# Patient Record
Sex: Male | Born: 1960
Health system: Southern US, Community
[De-identification: ages and names within clinical notes are randomized; demographics above are authoritative.]

## PROBLEM LIST (undated history)

## (undated) DIAGNOSIS — G709 Myoneural disorder, unspecified: Secondary | ICD-10-CM

## (undated) DIAGNOSIS — F1020 Alcohol dependence, uncomplicated: Secondary | ICD-10-CM

## (undated) DIAGNOSIS — K759 Inflammatory liver disease, unspecified: Secondary | ICD-10-CM

## (undated) DIAGNOSIS — K2100 Gastro-esophageal reflux disease with esophagitis, without bleeding: Secondary | ICD-10-CM

## (undated) DIAGNOSIS — F32A Depression, unspecified: Secondary | ICD-10-CM

## (undated) DIAGNOSIS — M1990 Unspecified osteoarthritis, unspecified site: Secondary | ICD-10-CM

## (undated) DIAGNOSIS — B9681 Helicobacter pylori [H. pylori] as the cause of diseases classified elsewhere: Secondary | ICD-10-CM

## (undated) DIAGNOSIS — K219 Gastro-esophageal reflux disease without esophagitis: Secondary | ICD-10-CM

## (undated) DIAGNOSIS — M109 Gout, unspecified: Secondary | ICD-10-CM

## (undated) DIAGNOSIS — I639 Cerebral infarction, unspecified: Secondary | ICD-10-CM

## (undated) DIAGNOSIS — D649 Anemia, unspecified: Secondary | ICD-10-CM

## (undated) DIAGNOSIS — G629 Polyneuropathy, unspecified: Secondary | ICD-10-CM

## (undated) DIAGNOSIS — D571 Sickle-cell disease without crisis: Secondary | ICD-10-CM

## (undated) DIAGNOSIS — I10 Essential (primary) hypertension: Secondary | ICD-10-CM

## (undated) DIAGNOSIS — F329 Major depressive disorder, single episode, unspecified: Secondary | ICD-10-CM

## (undated) DIAGNOSIS — L80 Vitiligo: Secondary | ICD-10-CM

## (undated) DIAGNOSIS — C801 Malignant (primary) neoplasm, unspecified: Secondary | ICD-10-CM

## (undated) DIAGNOSIS — K2970 Gastritis, unspecified, without bleeding: Secondary | ICD-10-CM

## (undated) DIAGNOSIS — J309 Allergic rhinitis, unspecified: Secondary | ICD-10-CM

## (undated) DIAGNOSIS — T7840XA Allergy, unspecified, initial encounter: Secondary | ICD-10-CM

## (undated) DIAGNOSIS — B182 Chronic viral hepatitis C: Secondary | ICD-10-CM

## (undated) DIAGNOSIS — M6282 Rhabdomyolysis: Secondary | ICD-10-CM

## (undated) HISTORY — PX: COLON SURGERY: SHX602

## (undated) HISTORY — DX: Cerebral infarction, unspecified: I63.9

---
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
## 2004-03-29 ENCOUNTER — Emergency Department: Payer: Self-pay | Admitting: Emergency Medicine

## 2005-09-05 ENCOUNTER — Emergency Department: Payer: Self-pay | Admitting: Emergency Medicine

## 2006-09-02 ENCOUNTER — Emergency Department: Payer: Self-pay | Admitting: Emergency Medicine

## 2006-09-03 ENCOUNTER — Emergency Department: Payer: Self-pay | Admitting: Unknown Physician Specialty

## 2006-09-10 ENCOUNTER — Emergency Department: Payer: Self-pay | Admitting: Emergency Medicine

## 2006-09-16 ENCOUNTER — Emergency Department: Payer: Self-pay | Admitting: Emergency Medicine

## 2007-01-11 ENCOUNTER — Emergency Department: Payer: Self-pay | Admitting: Emergency Medicine

## 2007-01-11 ENCOUNTER — Other Ambulatory Visit: Payer: Self-pay

## 2008-07-17 ENCOUNTER — Emergency Department: Payer: Self-pay | Admitting: Emergency Medicine

## 2008-07-21 ENCOUNTER — Emergency Department: Payer: Self-pay | Admitting: Emergency Medicine

## 2008-09-21 ENCOUNTER — Emergency Department: Payer: Self-pay | Admitting: Emergency Medicine

## 2009-12-11 ENCOUNTER — Emergency Department: Payer: Self-pay | Admitting: Emergency Medicine

## 2010-01-29 ENCOUNTER — Emergency Department: Payer: Self-pay | Admitting: Unknown Physician Specialty

## 2010-02-06 ENCOUNTER — Ambulatory Visit: Payer: Self-pay | Admitting: Internal Medicine

## 2010-03-28 ENCOUNTER — Ambulatory Visit: Payer: Self-pay | Admitting: Internal Medicine

## 2011-03-06 ENCOUNTER — Emergency Department: Payer: Self-pay

## 2011-09-28 ENCOUNTER — Emergency Department: Payer: Self-pay | Admitting: *Deleted

## 2011-09-28 LAB — COMPREHENSIVE METABOLIC PANEL
Albumin: 3.5 g/dL (ref 3.4–5.0)
Anion Gap: 11 (ref 7–16)
BUN: 16 mg/dL (ref 7–18)
Chloride: 106 mmol/L (ref 98–107)
Co2: 20 mmol/L — ABNORMAL LOW (ref 21–32)
Creatinine: 0.84 mg/dL (ref 0.60–1.30)
EGFR (Non-African Amer.): >60
Potassium: 4.2 mmol/L (ref 3.5–5.1)
Total Protein: 7.3 g/dL (ref 6.4–8.2)

## 2011-09-28 LAB — URINALYSIS, COMPLETE
Bilirubin,UR: NEGATIVE
Leukocyte Esterase: NEGATIVE
Protein: 30
Specific Gravity: 1.018 (ref 1.003–1.030)
Squamous Epithelial: NONE SEEN
WBC UR: 1 /HPF (ref 0–5)

## 2011-09-28 LAB — CBC
HCT: 41.3 % (ref 40.0–52.0)
HGB: 13.9 g/dL (ref 13.0–18.0)
MCH: 34.4 pg — ABNORMAL HIGH (ref 26.0–34.0)
MCHC: 33.7 g/dL (ref 32.0–36.0)
RBC: 4.04 10*6/uL — ABNORMAL LOW (ref 4.40–5.90)
WBC: 7.2 10*3/uL (ref 3.8–10.6)

## 2011-09-28 LAB — LIPASE, BLOOD: Lipase: 107 U/L (ref 73–393)

## 2011-10-08 ENCOUNTER — Ambulatory Visit: Payer: Self-pay | Admitting: Specialist

## 2012-02-26 ENCOUNTER — Ambulatory Visit: Payer: Self-pay | Admitting: Gastroenterology

## 2012-04-08 ENCOUNTER — Emergency Department: Payer: Self-pay | Admitting: Emergency Medicine

## 2012-04-08 LAB — URINALYSIS, COMPLETE
Bacteria: NONE SEEN
Glucose,UR: NEGATIVE mg/dL (ref 0–75)
Leukocyte Esterase: NEGATIVE
Nitrite: NEGATIVE
Protein: 30
RBC,UR: 4 /HPF (ref 0–5)
Specific Gravity: 1.023 (ref 1.003–1.030)
Squamous Epithelial: NONE SEEN
WBC UR: 1 /HPF (ref 0–5)

## 2012-04-08 LAB — COMPREHENSIVE METABOLIC PANEL
Anion Gap: 11 (ref 7–16)
Bilirubin,Total: 1.1 mg/dL — ABNORMAL HIGH (ref 0.2–1.0)
Calcium, Total: 8.5 mg/dL (ref 8.5–10.1)
Chloride: 106 mmol/L (ref 98–107)
Creatinine: 1 mg/dL (ref 0.60–1.30)
EGFR (African American): >60
EGFR (Non-African Amer.): >60
SGOT(AST): 139 U/L — ABNORMAL HIGH (ref 15–37)
SGPT (ALT): 104 U/L — ABNORMAL HIGH (ref 12–78)
Total Protein: 7.3 g/dL (ref 6.4–8.2)

## 2012-04-08 LAB — CBC
HCT: 39.7 % — ABNORMAL LOW (ref 40.0–52.0)
HGB: 13.4 g/dL (ref 13.0–18.0)
MCH: 34.4 pg — ABNORMAL HIGH (ref 26.0–34.0)
MCV: 102 fL — ABNORMAL HIGH (ref 80–100)
Platelet: 102 10*3/uL — ABNORMAL LOW (ref 150–440)
RDW: 14.9 % — ABNORMAL HIGH (ref 11.5–14.5)
WBC: 4.2 10*3/uL (ref 3.8–10.6)

## 2012-05-17 ENCOUNTER — Encounter (HOSPITAL_COMMUNITY): Payer: Self-pay | Admitting: *Deleted

## 2012-05-17 ENCOUNTER — Inpatient Hospital Stay (HOSPITAL_COMMUNITY)
Admission: AD | Admit: 2012-05-17 | Discharge: 2012-05-20 | DRG: 064 | Disposition: A | Discharge status: Rehabilitation Facility | Payer: Medicare Other | Source: Other Acute Inpatient Hospital | Attending: Neurology | Admitting: Neurology

## 2012-05-17 ENCOUNTER — Emergency Department: Payer: Self-pay | Admitting: Emergency Medicine

## 2012-05-17 DIAGNOSIS — F3289 Other specified depressive episodes: Secondary | ICD-10-CM | POA: Diagnosis present

## 2012-05-17 DIAGNOSIS — F329 Major depressive disorder, single episode, unspecified: Secondary | ICD-10-CM | POA: Diagnosis present

## 2012-05-17 DIAGNOSIS — I639 Cerebral infarction, unspecified: Secondary | ICD-10-CM | POA: Diagnosis present

## 2012-05-17 DIAGNOSIS — Z8249 Family history of ischemic heart disease and other diseases of the circulatory system: Secondary | ICD-10-CM

## 2012-05-17 DIAGNOSIS — R51 Headache: Secondary | ICD-10-CM | POA: Diagnosis present

## 2012-05-17 DIAGNOSIS — F1210 Cannabis abuse, uncomplicated: Secondary | ICD-10-CM | POA: Diagnosis present

## 2012-05-17 DIAGNOSIS — R471 Dysarthria and anarthria: Secondary | ICD-10-CM | POA: Diagnosis present

## 2012-05-17 DIAGNOSIS — I619 Nontraumatic intracerebral hemorrhage, unspecified: Secondary | ICD-10-CM | POA: Diagnosis present

## 2012-05-17 DIAGNOSIS — I6340 Cerebral infarction due to embolism of unspecified cerebral artery: Principal | ICD-10-CM | POA: Diagnosis present

## 2012-05-17 DIAGNOSIS — Q211 Atrial septal defect: Secondary | ICD-10-CM

## 2012-05-17 DIAGNOSIS — Q2111 Secundum atrial septal defect: Secondary | ICD-10-CM

## 2012-05-17 DIAGNOSIS — K219 Gastro-esophageal reflux disease without esophagitis: Secondary | ICD-10-CM | POA: Diagnosis present

## 2012-05-17 DIAGNOSIS — Z823 Family history of stroke: Secondary | ICD-10-CM

## 2012-05-17 DIAGNOSIS — G936 Cerebral edema: Secondary | ICD-10-CM | POA: Diagnosis present

## 2012-05-17 DIAGNOSIS — R519 Headache, unspecified: Secondary | ICD-10-CM | POA: Diagnosis present

## 2012-05-17 DIAGNOSIS — Z87891 Personal history of nicotine dependence: Secondary | ICD-10-CM

## 2012-05-17 DIAGNOSIS — Z833 Family history of diabetes mellitus: Secondary | ICD-10-CM

## 2012-05-17 DIAGNOSIS — G8190 Hemiplegia, unspecified affecting unspecified side: Secondary | ICD-10-CM | POA: Diagnosis present

## 2012-05-17 DIAGNOSIS — Q2112 Patent foramen ovale: Secondary | ICD-10-CM

## 2012-05-17 DIAGNOSIS — I10 Essential (primary) hypertension: Secondary | ICD-10-CM | POA: Diagnosis present

## 2012-05-17 HISTORY — DX: Depression, unspecified: F32.A

## 2012-05-17 HISTORY — DX: Inflammatory liver disease, unspecified: K75.9

## 2012-05-17 HISTORY — DX: Gastro-esophageal reflux disease without esophagitis: K21.9

## 2012-05-17 HISTORY — DX: Major depressive disorder, single episode, unspecified: F32.9

## 2012-05-17 HISTORY — DX: Essential (primary) hypertension: I10

## 2012-05-17 LAB — COMPREHENSIVE METABOLIC PANEL
Albumin: 3.7 g/dL (ref 3.4–5.0)
Alkaline Phosphatase: 169 U/L — ABNORMAL HIGH (ref 50–136)
Anion Gap: 11 (ref 7–16)
BUN: 14 mg/dL (ref 7–18)
Bilirubin,Total: 0.9 mg/dL (ref 0.2–1.0)
Calcium, Total: 8 mg/dL — ABNORMAL LOW (ref 8.5–10.1)
Chloride: 105 mmol/L (ref 98–107)
Co2: 21 mmol/L (ref 21–32)
Creatinine: 0.99 mg/dL (ref 0.60–1.30)
EGFR (African American): >60
EGFR (Non-African Amer.): >60
Glucose: 134 mg/dL — ABNORMAL HIGH (ref 65–99)
Osmolality: 276 (ref 275–301)
Potassium: 3.8 mmol/L (ref 3.5–5.1)
SGOT(AST): 245 U/L — ABNORMAL HIGH (ref 15–37)
SGPT (ALT): 143 U/L — ABNORMAL HIGH (ref 12–78)
Sodium: 137 mmol/L (ref 136–145)
Total Protein: 7.6 g/dL (ref 6.4–8.2)

## 2012-05-17 LAB — CBC
HCT: 42 % (ref 40.0–52.0)
HGB: 14 g/dL (ref 13.0–18.0)
MCH: 34 pg (ref 26.0–34.0)
MCHC: 33.4 g/dL (ref 32.0–36.0)
MCV: 102 fL — ABNORMAL HIGH (ref 80–100)
Platelet: 118 10*3/uL — ABNORMAL LOW (ref 150–440)
RBC: 4.12 10*6/uL — ABNORMAL LOW (ref 4.40–5.90)
RDW: 13.4 % (ref 11.5–14.5)
WBC: 5.9 10*3/uL (ref 3.8–10.6)

## 2012-05-17 LAB — PROTIME-INR
INR: 1.1
Prothrombin Time: 14.3 secs (ref 11.5–14.7)

## 2012-05-17 LAB — MRSA PCR SCREENING: MRSA by PCR: NEGATIVE

## 2012-05-17 MED ORDER — SENNOSIDES-DOCUSATE SODIUM 8.6-50 MG PO TABS
1.0000 | ORAL_TABLET | Freq: Two times a day (BID) | ORAL | Status: DC
  Administered 2012-05-18 – 2012-05-19 (×3): 1 via ORAL
  Filled 2012-05-17 (×7): qty 1

## 2012-05-17 MED ORDER — TRAMADOL HCL 50 MG PO TABS
50.0000 mg | ORAL_TABLET | Freq: Four times a day (QID) | ORAL | Status: DC | PRN
  Administered 2012-05-18 – 2012-05-19 (×3): 100 mg via ORAL
  Filled 2012-05-17 (×5): qty 2

## 2012-05-17 MED ORDER — HYDROMORPHONE HCL PF 1 MG/ML IJ SOLN
0.5000 mg | INTRAMUSCULAR | Status: DC | PRN
  Administered 2012-05-17 – 2012-05-18 (×5): 0.5 mg via INTRAVENOUS
  Filled 2012-05-17 (×5): qty 1

## 2012-05-17 MED ORDER — DOXEPIN HCL 25 MG PO CAPS
25.0000 mg | ORAL_CAPSULE | Freq: Every day | ORAL | Status: DC
  Administered 2012-05-18 – 2012-05-19 (×2): 25 mg via ORAL
  Filled 2012-05-17 (×3): qty 1

## 2012-05-17 MED ORDER — ONDANSETRON HCL 4 MG/2ML IJ SOLN: 4.0000 mg | Freq: Four times a day (QID) | INTRAMUSCULAR | Status: DC | PRN

## 2012-05-17 MED ORDER — SODIUM CHLORIDE 0.9 % IV SOLN
INTRAVENOUS | Status: DC
  Administered 2012-05-17 – 2012-05-20 (×4): via INTRAVENOUS

## 2012-05-17 MED ORDER — DULOXETINE HCL 60 MG PO CPEP
60.0000 mg | ORAL_CAPSULE | Freq: Every day | ORAL | Status: DC
  Administered 2012-05-18 – 2012-05-19 (×2): 60 mg via ORAL
  Filled 2012-05-17 (×3): qty 1

## 2012-05-17 MED ORDER — ACETAMINOPHEN 650 MG RE SUPP: 650.0000 mg | RECTAL | Status: DC | PRN

## 2012-05-17 MED ORDER — PANTOPRAZOLE SODIUM 40 MG IV SOLR
40.0000 mg | Freq: Every day | INTRAVENOUS | Status: DC
  Administered 2012-05-17: 40 mg via INTRAVENOUS
  Filled 2012-05-17 (×2): qty 40

## 2012-05-17 MED ORDER — ACETAMINOPHEN 325 MG PO TABS: 650.0000 mg | ORAL_TABLET | ORAL | Status: DC | PRN

## 2012-05-17 MED ORDER — SUCRALFATE 1 G PO TABS
1.0000 g | ORAL_TABLET | Freq: Three times a day (TID) | ORAL | Status: DC
  Administered 2012-05-18 – 2012-05-20 (×8): 1 g via ORAL
  Filled 2012-05-17 (×14): qty 1

## 2012-05-17 MED ORDER — LABETALOL HCL 5 MG/ML IV SOLN
10.0000 mg | INTRAVENOUS | Status: DC | PRN
  Administered 2012-05-17 – 2012-05-18 (×3): 20 mg via INTRAVENOUS
  Filled 2012-05-17 (×3): qty 4

## 2012-05-17 MED ORDER — HYDROXYZINE HCL 25 MG PO TABS
25.0000 mg | ORAL_TABLET | Freq: Four times a day (QID) | ORAL | Status: DC | PRN
  Filled 2012-05-17: qty 1

## 2012-05-17 MED ORDER — GABAPENTIN 100 MG PO CAPS
100.0000 mg | ORAL_CAPSULE | Freq: Two times a day (BID) | ORAL | Status: DC
  Administered 2012-05-18 – 2012-05-19 (×4): 100 mg via ORAL
  Filled 2012-05-17 (×6): qty 1

## 2012-05-17 NOTE — H&P (Signed)
Admission H&P    Chief Complaint: Left sided weakness, headache  HPI: Edward Weber is an 52 y.o. male who reports going to bed normal last evening.  Awakened today and felt drained.  On attempting to go to the bathroom felt as if he was having difficulty walking.  Then noted that he was having difficulty using his left arm.  Patient also complains of a headache.  Describes the headache as frontal and throbbing.  Associated with nausea but no vomiting, photophobia or phonophobia.  Patient was brought to [facility] ED.  Initial BP was 163/99.  Patient given Labetolol and Hydralazine for for BP.  Head CT was performed showing a right basal ganglia hemorrhage.  Patient was transferred to [facility] for further management.   Has a history of HTN but was taken off of medication about a year ago.    LSN: [id number] on 05/16/2012 tPA Given: No: I[facility]  Past Medical History  Diagnosis Date  . Hypertension   . GERD (gastroesophageal reflux disease)   . Depression   . Hepatitis     Past surgical history: None  Family history:  Mother is deceased and died of a stroke.  Father died of colon cancer.  Has a sister with diabetes and heart disease.  Children ar alive and well.  Social History:  reports that he has quit smoking. He does not have any smokeless tobacco history on file. He reports that he drinks about 5.4 ounces of alcohol per week. He reports that he uses illicit drugs (Marijuana).  Allergies: No Known Allergies  Medications Prior to Admission  Medication Sig Dispense Refill  . hydrOXYzine (ATARAX/VISTARIL) 25 MG tablet Take 25 mg by mouth 3 (three) times daily as needed. For itching      . lansoprazole (PREVACID) 15 MG capsule Take 15 mg by mouth daily as needed. For acid reflux      . naproxen sodium (ANAPROX) 220 MG tablet Take 440 mg by mouth 2 (two) times daily as needed. For headache        ROS: History obtained from the patient  General ROS: negative for - chills, fatigue, fever, night sweats,  weight gain or weight loss Psychological ROS: negative for - behavioral disorder, hallucinations, memory difficulties, mood swings or suicidal ideation Ophthalmic ROS: negative for - blurry vision, double vision, eye pain or loss of vision ENT ROS: negative for - epistaxis, nasal discharge, oral lesions, sore throat, tinnitus or vertigo Allergy and Immunology ROS: negative for - hives or itchy/watery eyes Hematological and Lymphatic ROS: negative for - bleeding problems, bruising or swollen lymph nodes Endocrine ROS: negative for - galactorrhea, hair pattern changes, polydipsia/polyuria or temperature intolerance Respiratory ROS: negative for - cough, hemoptysis, shortness of breath or wheezing Cardiovascular ROS: negative for - chest pain, dyspnea on exertion, edema or irregular heartbeat Gastrointestinal ROS: abdominal pain Genito-Urinary ROS: negative for - dysuria, hematuria, incontinence or urinary frequency/urgency Musculoskeletal ROS: negative for - joint swelling or muscular weakness Neurological ROS: as noted in HPI Dermatological ROS: rash with itching  Physical Examination: Blood pressure 154/87, pulse 88, temperature 98 F (36.7 C), temperature source Oral, resp. rate 21, height 5\' 8"  (1.727 m), weight 79.3 kg (174 lb 13.2 oz), SpO2 96.00%.  General Examination: HEENT-  Normocephalic, no lesions, without obvious abnormality.  Normal external eye and conjunctiva.  Normal TM's bilaterally.  Normal auditory canals and external ears. Normal external nose, mucus membranes and septum.  Normal pharynx. Neck supple with no masses, nodes, nodules or enlargement.  Cardiovascular - S1, S2 normal Lungs - chest clear, no wheezing, rales, normal symmetric air entry Abdomen - positive bowel sounds Extremities - no edema  Neurologic Examination: Mental Status: Alert, oriented, thought content appropriate.  Speech fluent without evidence of aphasia.  Able to follow 3 step commands without  difficulty. Cranial Nerves: II: Discs flat bilaterally; Visual fields grossly normal, pupils equal, round, reactive to light and accommodation III,IV, VI: ptosis not present, extra-ocular motions intact bilaterally V,VII: left facial droop, facial light touch sensation normal bilaterally VIII: hearing normal bilaterally IX,X: gag reflex reduced XI: bilateral shoulder shrug XII: midline tongue extension Motor: Right : Upper extremity   5/5    Left:     Upper extremity   4-/5  Lower extremity   5/5     Lower extremity   3+/5 Tone and bulk:normal tone throughout; no atrophy noted Sensory: Pinprick and light touch intact throughout, bilaterally Deep Tendon Reflexes: 2+ and symmetric throughout Plantars: Right: downgoing   Left: upgoing Cerebellar: normal finger-to-nose and normal heel-to-shin testing on the right Gait: unable to test CV: pulses palpable throughout   Laboratory Studies:  Lab work from [employer] reviewed and significant for elevated LFT's.   Basic Metabolic Panel: No results found for this basename: NA:5,K:5,CL:5,CO2:5,GLUCOSE:5,BUN:5,CREATININE:5,CALCIUM:3,MG:5,PHOS:5 in the last 168 hours  Liver Function Tests: No results found for this basename: AST:5,ALT:5,ALKPHOS:5,BILITOT:5,PROT:5,ALBUMIN:5 in the last 168 hours No results found for this basename: LIPASE:5,AMYLASE:5 in the last 168 hours No results found for this basename: AMMONIA:3 in the last 168 hours  CBC: No results found for this basename: WBC:5,NEUTROABS:5,HGB:5,HCT:5,MCV:5,PLT:5 in the last 168 hours  Cardiac Enzymes: No results found for this basename: CKTOTAL:5,CKMB:5,CKMBINDEX:5,TROPONINI:5 in the last 168 hours  BNP: No components found with this basename: POCBNP:5  CBG: No results found for this basename: GLUCAP:5 in the last 168 hours  Microbiology: No results found for this or any previous visit.  Coagulation Studies: No results found for this basename: LABPROT:5,INR:5 in the last 72  hours  Urinalysis: No results found for this basename: COLORURINE:2,APPERANCEUR:2,LABSPEC:2,PHURINE:2,GLUCOSEU:2,HGBUR:2,BILIRUBINUR:2,KETONESUR:2,PROTEINUR:2,UROBILINOGEN:2,NITRITE:2,LEUKOCYTESUR:2 in the last 168 hours  Lipid Panel:  No results found for this basename: chol, trig, hdl, cholhdl, vldl, ldlcalc    HgbA1C:  No results found for this basename: HGBA1C    Urine Drug Screen:   No results found for this basename: labopia, cocainscrnur, labbenz, amphetmu, thcu, labbarb    Alcohol Level: No results found for this basename: ETH:2 in the last 168 hours  EKG: NSR at 75 bpm.  Moderate voltage criteria for LVH.  Imaging: CT head: right basal ganglia hemorrhage with mild adjacent vasogenic edema but no midline shift.  Assessment: 52 y.o. male presenting with left sided weakness and right basal ganglia hemorrhage.  Patient with a history of hypertension.  Location consistent with hypertension.  Further work up recommended.    Stroke Risk Factors - hypertension  Plan: 1. HgbA1c, fasting lipid panel 2. MRI, MRA  of the brain without contrast 3. PT consult, OT consult, Speech consult 4. Echocardiogram 5. Carotid dopplers 6. Prophylactic therapy-None 7. Risk factor modification 8. Telemetry monitoring 9. Frequent neuro checks 10. Continue home emdications   Thana Farr, MD [facility] [phone] 05/17/2012, 8:05 PM

## 2012-05-18 ENCOUNTER — Inpatient Hospital Stay (HOSPITAL_COMMUNITY): Payer: Medicare Other

## 2012-05-18 DIAGNOSIS — G8190 Hemiplegia, unspecified affecting unspecified side: Secondary | ICD-10-CM

## 2012-05-18 DIAGNOSIS — I619 Nontraumatic intracerebral hemorrhage, unspecified: Secondary | ICD-10-CM

## 2012-05-18 LAB — LIPID PANEL
Cholesterol: 178 mg/dL (ref 0–200)
Triglycerides: 354 mg/dL — ABNORMAL HIGH (ref <150)

## 2012-05-18 LAB — HEMOGLOBIN A1C: Mean Plasma Glucose: 117 mg/dL — ABNORMAL HIGH (ref <117)

## 2012-05-18 MED ORDER — DIPHENHYDRAMINE HCL 50 MG/ML IJ SOLN
12.5000 mg | Freq: Once | INTRAMUSCULAR | Status: AC
  Administered 2012-05-18: 12.5 mg via INTRAVENOUS
  Filled 2012-05-18: qty 1

## 2012-05-18 MED ORDER — HYDROCHLOROTHIAZIDE 25 MG PO TABS
25.0000 mg | ORAL_TABLET | Freq: Every day | ORAL | Status: DC
  Administered 2012-05-18 – 2012-05-20 (×3): 25 mg via ORAL
  Filled 2012-05-18 (×4): qty 1

## 2012-05-18 MED ORDER — DIPHENHYDRAMINE-ZINC ACETATE 2-0.1 % EX CREA
TOPICAL_CREAM | Freq: Three times a day (TID) | CUTANEOUS | Status: DC | PRN
  Administered 2012-05-18: 1 via TOPICAL
  Filled 2012-05-18: qty 28

## 2012-05-18 MED ORDER — PANTOPRAZOLE SODIUM 40 MG PO TBEC
40.0000 mg | DELAYED_RELEASE_TABLET | Freq: Every day | ORAL | Status: DC
  Administered 2012-05-18 – 2012-05-19 (×2): 40 mg via ORAL
  Filled 2012-05-18 (×2): qty 1

## 2012-05-18 NOTE — Evaluation (Signed)
Clinical/Bedside Swallow Evaluation Patient Details  Name: MYSHAWN CHIRIBOGA MRN: [id number] Date of Birth: [birth year]  Today's Date: 05/18/2012 Time: 4098-1191 SLP Time Calculation (min): 17 min  Past Medical History:  Past Medical History  Diagnosis Date  . Hypertension   . GERD (gastroesophageal reflux disease)   . Depression   . Hepatitis    Past Surgical History: History reviewed. No pertinent past surgical history. HPI:  STCLAIR SZYMBORSKI is an 52 y.o. male who reports going to bed normal last evening 05/16/2012. Awakened today 05/17/2012 and felt drained. On attempting to go to the bathroom felt as if he was having difficulty walking. Then noted that he was having difficulty using his left arm. Patient also complains of a headache. Describes the headache as frontal and throbbing. Associated with nausea but no vomiting, photophobia or phonophobia. Patient was brought to [facility] ED. Initial BP was 163/99. Patient given Labetolol and Hydralazine for for BP. Head CT was performed showing a right basal ganglia hemorrhage. Patient was transferred to [facility] for further management.    Assessment / Plan / Recommendation Clinical Impression  Patient presents with a safe and functional oropharyngeal swallow without overt indication of aspiration. Recommend a regular diet, thin liquid with general safe swallowing precautions given acute nature of CVA. No SLP f/u for swallow indicated at this time. SLP signing off.     Aspiration Risk  Mild    Diet Recommendation Regular;Thin liquid   Liquid Administration via: Cup;Straw Medication Administration: Whole meds with liquid Supervision: Patient able to self feed Compensations: Slow rate;Small sips/bites Postural Changes and/or Swallow Maneuvers: Seated upright 90 degrees    Other  Recommendations Oral Care Recommendations: Oral care BID   Follow Up Recommendations  None    Frequency and Duration        Pertinent Vitals/Pain None reported         Swallow Study    General HPI: HUNTLEY KNOOP is an 53 y.o. male who reports going to bed normal last evening 05/16/2012. Awakened today 05/17/2012 and felt drained. On attempting to go to the bathroom felt as if he was having difficulty walking. Then noted that he was having difficulty using his left arm. Patient also complains of a headache. Describes the headache as frontal and throbbing. Associated with nausea but no vomiting, photophobia or phonophobia. Patient was brought to [facility] ED. Initial BP was 163/99. Patient given Labetolol and Hydralazine for for BP. Head CT was performed showing a right basal ganglia hemorrhage. Patient was transferred to [facility] for further management.  Type of Study: Bedside swallow evaluation Previous Swallow Assessment: none Diet Prior to this Study: NPO Temperature Spikes Noted: No Respiratory Status: Room air History of Recent Intubation: No Behavior/Cognition: Alert;Cooperative;Pleasant mood Oral Cavity - Dentition: Adequate natural dentition Self-Feeding Abilities: Able to feed self Patient Positioning: Upright in bed Baseline Vocal Quality: Clear Volitional Cough: Strong Volitional Swallow: Able to elicit    Oral/Motor/Sensory Function Overall Oral Motor/Sensory Function: Impaired Labial ROM: Reduced left (slight) Labial Symmetry: Abnormal symmetry left (slight) Labial Strength: Within Functional Limits Labial Sensation: Within Functional Limits Lingual ROM: Within Functional Limits Lingual Symmetry: Within Functional Limits Lingual Strength: Within Functional Limits Lingual Sensation: Within Functional Limits Facial ROM: Within Functional Limits Facial Symmetry: Within Functional Limits Facial Strength: Within Functional Limits Facial Sensation: Within Functional Limits Velum: Within Functional Limits Mandible: Within Functional Limits   Ice Chips Ice chips: Not tested   Thin Liquid Thin Liquid: Within functional limits Presentation:  Cup;Self Fed;Straw  Nectar Thick Nectar Thick Liquid: Not tested   Honey Thick Honey Thick Liquid: Not tested   Puree Puree: Within functional limits Presentation: Self Fed;Spoon   Solid   GO   Emmanuel Ercole MA, CCC-SLP [phone]  Solid: Within functional limits Presentation: Self Fed       Geremy Rister Meryl 05/18/2012,1:06 PM

## 2012-05-18 NOTE — Evaluation (Signed)
Physical Therapy Evaluation Patient Details Name: Edward Weber MRN: [id number] DOB: June 15, 1960 Today's Date: 05/18/2012 Time: 6295-2841 PT Time Calculation (min): 24 min  PT Assessment / Plan / Recommendation Clinical Impression  52 y.o. male presenting with difficulty walking, trouble using his left arm, and headache. Imaging confirms a right basal ganglia hemorrhage.  Pt will benefit from acute PT services to improve overall mobility and prepare for safe d/c to next venue.    PT Assessment  Patient needs continued PT services    Follow Up Recommendations  CIR    Barriers to Discharge None      Equipment Recommendations  Rolling walker with 5" wheels    Recommendations for Other Services Rehab consult   Frequency Min 4X/week    Precautions / Restrictions Precautions Precautions: Fall Restrictions Weight Bearing Restrictions: No   Pertinent Vitals/Pain No c/o pain      Mobility  Bed Mobility Bed Mobility: Supine to Sit;Sitting - Scoot to Edge of Bed Supine to Sit: 4: Min assist Sitting - Scoot to Edge of Bed: 5: Supervision Details for Bed Mobility Assistance: (A) to elevate trunk OOB with cues for safety. Transfers Transfers: Sit to Stand;Stand to [employer] Transfers Sit to Stand: 4: Min assist Stand to Sit: 4: Min assist Stand Pivot Transfers: 1: +2 Total assist Stand Pivot Transfers: Patient Percentage: 60% Details for Transfer Assistance: (A) to initiate transfer with cues for hand placement.  +2 (A) to maintain balance with cues for step sequence and hand placement.  Limited mobility due to dizziness. Ambulation/Gait Ambulation/Gait Assistance: Not tested (comment) Modified Rankin (Stroke Patients Only) Pre-Morbid Rankin Score: No symptoms Modified Rankin: Severe disability     PT Diagnosis: Difficulty walking;Generalized weakness  PT Problem List: Decreased strength;Decreased activity tolerance;Decreased balance;Decreased mobility;Decreased  coordination;Decreased knowledge of use of DME;Decreased knowledge of precautions PT Treatment Interventions: DME instruction;Gait training;Stair training;Functional mobility training;Therapeutic exercise;Therapeutic activities;Balance training;Neuromuscular re-education;Patient/family education   PT Goals Acute Rehab PT Goals PT Goal Formulation: With patient Time For Goal Achievement: 06/01/12 Potential to Achieve Goals: Good Pt will go Supine/Side to Sit: with modified independence PT Goal: Supine/Side to Sit - Progress: Goal set today Pt will go Sit to Supine/Side: with modified independence PT Goal: Sit to Supine/Side - Progress: Goal set today Pt will go Sit to Stand: with modified independence PT Goal: Sit to Stand - Progress: Goal set today Pt will go Stand to Sit: with modified independence PT Goal: Stand to Sit - Progress: Goal set today Pt will Transfer Bed to Chair/Chair to Bed: with supervision PT Transfer Goal: Bed to Chair/Chair to Bed - Progress: Goal set today Pt will Stand: with supervision;1 - 2 min PT Goal: Stand - Progress: Goal set today Pt will Ambulate: 16 - 50 feet;with min assist;with least restrictive assistive device PT Goal: Ambulate - Progress: Goal set today  Visit Information  Last PT Received On: 05/18/12 Assistance Needed: +2    Subjective Data  Subjective: "I feel weaker on the left side." Patient Stated Goal: To eventually go home.   Prior Functioning  Home Living Lives With: Alone Type of Home: Apartment Home Access: Stairs to enter Entrance Stairs-Number of Steps: 3 Entrance Stairs-Rails: Left Home Layout: One level Bathroom Shower/Tub: Forensic scientist: Standard Home Adaptive Equipment: None Prior Function Level of Independence: Independent (sister gets groceries) Able to Take Stairs?: Yes Driving: Yes Vocation: On disability Communication Communication: No difficulties Dominant Hand: Right    Cognition   Cognition Overall Cognitive Status: Appears within  functional limits for tasks assessed/performed Arousal/Alertness: Awake/alert Orientation Level: Appears intact for tasks assessed Behavior During Session: [facility] for tasks performed    Extremity/Trunk Assessment Right Lower Extremity Assessment RLE ROM/Strength/Tone: Deficits RLE ROM/Strength/Tone Deficits: 4+/5 Grossly RLE Sensation: WFL - Light Touch RLE Coordination: WFL - gross/fine motor Left Lower Extremity Assessment LLE ROM/Strength/Tone: Deficits LLE ROM/Strength/Tone Deficits: 3+/5 grossly LLE Sensation: WFL - Light Touch LLE Coordination: Deficits (Impaired heel shin)   Balance Balance Balance Assessed: Yes Static Sitting Balance Static Sitting - Balance Support: Feet supported Static Sitting - Level of Assistance: 5: Stand by assistance  End of Session PT - End of Session Equipment Utilized During Treatment: Gait belt Activity Tolerance: Patient tolerated treatment well Patient left: in chair;with call bell/phone within reach Nurse Communication: Mobility status  GP     Mikaella Escalona 05/18/2012, 3:10 PM Jake Shark, PT DPT [phone]

## 2012-05-18 NOTE — Progress Notes (Signed)
UR COMPLETED  

## 2012-05-18 NOTE — Evaluation (Signed)
Speech Language Pathology Evaluation Patient Details Name: Edward Weber MRN: [id number] DOB: 1960/04/26 Today's Date: 05/18/2012 Time: 7829-5621 SLP Time Calculation (min): 9 min  Problem List:  Patient Active Problem List  Diagnosis  . Hemiplegia, unspecified, affecting nondominant side  . Intracerebral hemorrhage   Past Medical History:  Past Medical History  Diagnosis Date  . Hypertension   . GERD (gastroesophageal reflux disease)   . Depression   . Hepatitis    Past Surgical History: History reviewed. No pertinent past surgical history. HPI:  Edward Weber is an 52 y.o. male who reports going to bed normal last evening 05/16/2012. Awakened today 05/17/2012 and felt drained. On attempting to go to the bathroom felt as if he was having difficulty walking. Then noted that he was having difficulty using his left arm. Patient also complains of a headache. Describes the headache as frontal and throbbing. Associated with nausea but no vomiting, photophobia or phonophobia. Patient was brought to [facility] ED. Initial BP was 163/99. Patient given Labetolol and Hydralazine for for BP. Head CT was performed showing a right basal ganglia hemorrhage. Patient was transferred to [facility] for further management.    Assessment / Plan / Recommendation Clinical Impression  Cognitive linguistic evaluation complete. Cognitive-linguistic skills appear WFL at this time. No further SLP needs indicated at this time. Please reconsult as needed.     SLP Assessment  Patient does not need any further Speech Lanaguage Pathology Services    Follow Up Recommendations  None       Pertinent Vitals/Pain None reported    SLP Evaluation Prior Functioning  Cognitive/Linguistic Baseline: Within functional limits Type of Home: House Lives With: Alone   Cognition  Overall Cognitive Status: Appears within functional limits for tasks assessed Orientation Level: Oriented X4    Comprehension  Auditory  Comprehension Overall Auditory Comprehension: Appears within functional limits for tasks assessed Visual Recognition/Discrimination Discrimination: Within Function Limits Reading Comprehension Reading Status: Not tested    Expression Expression Primary Mode of Expression: Verbal Verbal Expression Overall Verbal Expression: Appears within functional limits for tasks assessed   Oral / Motor Oral Motor/Sensory Function Overall Oral Motor/Sensory Function: Impaired Labial ROM: Reduced left (slight) Labial Symmetry: Abnormal symmetry left (slight) Labial Strength: Within Functional Limits Labial Sensation: Within Functional Limits Lingual ROM: Within Functional Limits Lingual Symmetry: Within Functional Limits Lingual Strength: Within Functional Limits Lingual Sensation: Within Functional Limits Facial ROM: Within Functional Limits Facial Symmetry: Within Functional Limits Facial Strength: Within Functional Limits Facial Sensation: Within Functional Limits Velum: Within Functional Limits Mandible: Within Functional Limits Motor Speech Overall Motor Speech: Appears within functional limits for tasks assessed (low vocal intensity however functional)   GO   Ferdinand Lango MA, CCC-SLP [phone]   Edward Weber Meryl 05/18/2012, 1:11 PM

## 2012-05-18 NOTE — Progress Notes (Signed)
Stroke Team Progress Note  HISTORY Edward Weber is an 52 y.o. male who reports going to bed normal last evening 05/16/2012. Awakened today 05/17/2012 and felt drained. On attempting to go to the bathroom felt as if he was having difficulty walking. Then noted that he was having difficulty using his left arm. Patient also complains of a headache. Describes the headache as frontal and throbbing. Associated with nausea but no vomiting, photophobia or phonophobia. Patient was brought to [facility] ED. Initial BP was 163/99. Patient given Labetolol and Hydralazine for for BP. Head CT was performed showing a right basal ganglia hemorrhage. Patient was transferred to [facility] for further management. Has a history of HTN but was taken off of medication about a year ago. Patient was not a TPA candidate secondary to hemorrhage. He was admitted to the neuro ICU for further evaluation and treatment.  SUBJECTIVE No family is at the bedside.  Overall he feels his condition is stable.   OBJECTIVE Most recent Vital Signs: Filed Vitals:   05/18/12 0700 05/18/12 0730 05/18/12 0750 05/18/12 0800  BP: 156/134 158/88  165/81  Pulse: 66 60  61  Temp:   98.2 F (36.8 C)   TempSrc:   Oral   Resp: 17 17  16   Height:      Weight:      SpO2: 98% 95%  96%   CBG (last 3)  No results found for this basename: GLUCAP:3 in the last 72 hours  IV Fluid Intake:      . sodium chloride 75 mL/hr at 05/18/12 0808    MEDICATIONS     . doxepin  25 mg Oral QHS  . DULoxetine  60 mg Oral Daily  . gabapentin  100 mg Oral BID  . pantoprazole (PROTONIX) IV  40 mg Intravenous QHS  . senna-docusate  1 tablet Oral BID  . sucralfate  1 g Oral TID WC & HS   PRN:  acetaminophen, acetaminophen, diphenhydrAMINE-zinc acetate, HYDROmorphone (DILAUDID) injection, hydrOXYzine, labetalol, ondansetron (ZOFRAN) IV, traMADol  Diet:  NPO  Activity:  Bedrest DVT Prophylaxis:  SCDs   CLINICALLY SIGNIFICANT STUDIES Basic Metabolic Panel: No  results found for this basename: NA:2,K:2,CL:2,CO2:2,GLUCOSE:2,BUN:2,CREATININE:2,CALCIUM:2,MG:2,PHOS:2 in the last 168 hours Liver Function Tests: No results found for this basename: AST:2,ALT:2,ALKPHOS:2,BILITOT:2,PROT:2,ALBUMIN:2 in the last 168 hours CBC: No results found for this basename: WBC:2,NEUTROABS:2,HGB:2,HCT:2,MCV:2,PLT:2 in the last 168 hours Coagulation: No results found for this basename: LABPROT:4,INR:4 in the last 168 hours Cardiac Enzymes: No results found for this basename: CKTOTAL:3,CKMB:3,CKMBINDEX:3,TROPONINI:3 in the last 168 hours Urinalysis: No results found for this basename: COLORURINE:2,APPERANCEUR:2,LABSPEC:2,PHURINE:2,GLUCOSEU:2,HGBUR:2,BILIRUBINUR:2,KETONESUR:2,PROTEINUR:2,UROBILINOGEN:2,NITRITE:2,LEUKOCYTESUR:2 in the last 168 hours Lipid Panel No results found for this basename: chol,  trig,  hdl,  cholhdl,  vldl,  ldlcalc   HgbA1C  No results found for this basename: HGBA1C   Urine Drug Screen:   No results found for this basename: labopia,  cocainscrnur,  labbenz,  amphetmu,  thcu,  labbarb    Alcohol Level: No results found for this basename: ETH:2 in the last 168 hours  No results found.  CT of the brain    MRI of the brain    MRA of the brain    2D Echocardiogram    Carotid Doppler    CXR    EKG     Therapy Recommendations   Physical Exam   Pleasant middle aged african [place] male not in distress.Awake alert. Afebrile. Head is nontraumatic. Neck is supple without bruit. Hearing is normal. Cardiac exam no murmur or gallop.  Lungs are clear to auscultation. Distal pulses are well felt.  Neurological Exam : Awake alert oriented x 3 normal speech and language.extraocular movements are full range without nystagmus. Fundi were not visualized. Pupils are equal reactive. Vision acuity and fields appear normal. Mild left lower face asymmetry. Tongue midline. No drift. Mild diminished fine finger movements on left.mild left upper and lower extremity  drift with 4/5 strength. Orbits right over left upper extremity. Mild left grip weak.. Normal sensation . Normal coordination.  ASSESSMENT [name] is a 51 y.o. male presenting with difficulty walking, trouble using his left arm, and headache. Imaging confirms a right basal ganglia hemorrhage. Hemorrhage felt to be possibly secondary to ischemic infarct with hemorrhagic transformation as BP not greatly elevated.  On no antiplatelets prior to admission. Patient with resultant left hemiparesis and dysarthria. Work up underway.  Hypertension, highest BP 177/94, 163/99 on arrival. Taken off medications 1 month ago GERD Depression Hepatitis etoh use Marijuana use  Hospital day # 1  TREATMENT/PLAN  OOB. Therapy evals  Keep in ICU to monitor hemorrhage  Carotid doppler  2D echo  Lipid panel and hgbA1c  MRI, MRA  CT head in am  Annie Main, MSN, RN, ANVP-BC, ANP-BC, GNP-BC [facility] Pager: [phone] 05/18/2012 9:05 AM  I have personally obtained a history, examined the patient, evaluated imaging results, and formulated the assessment and plan of care. I agree with the above. This patient is critically ill and at significant risk of neurological worsening, death and care requires constant monitoring of vital signs, hemodynamics,respiratory and cardiac monitoring,review of multiple databases, neurological assessment, discussion with family, other specialists and medical decision making of high complexity. I spent 30 minutes of neurocritical care time  in the care of  this patient.   Delia Heady, MD Medical Director [facility] Pager: [phone] 05/18/2012 6:00 PM

## 2012-05-18 NOTE — Progress Notes (Signed)
Rehab Admissions Coordinator Note:  Patient was screened by Lutricia Horsfall, RN for appropriateness for an Inpatient Acute Rehab Consult.  At this time, we are recommending Inpatient Rehab consult.  Meryl Dare 05/18/2012, 3:27 PM  I can be reached at [phone].

## 2012-05-18 NOTE — Evaluation (Signed)
Occupational Therapy Evaluation Patient Details Name: Edward Weber MRN: [id number] DOB: November 23, 1960 Today's Date: 05/18/2012 Time: 4098-1191 OT Time Calculation (min): 16 min  OT Assessment / Plan / Recommendation Clinical Impression  52 y.o. male presenting with difficulty walking, trouble using his left arm, and headache. Imaging confirms a right basal ganglia hemorrhage. Pt will benefit from skilled OT in the acute setting followed by CIR to maximize I with ADL and ADL mobility prior to d/c.     OT Assessment  Patient needs continued OT Services    Follow Up Recommendations  CIR    Barriers to Discharge      Equipment Recommendations   (TBD)    Recommendations for Other Services Rehab consult  Frequency  Min 3X/week    Precautions / Restrictions Precautions Precautions: Fall Restrictions Weight Bearing Restrictions: No   Pertinent Vitals/Pain Pt reports drowsiness but no pain    ADL  Eating/Feeding: Minimal assistance Where Assessed - Eating/Feeding: Chair Grooming: Minimal assistance Where Assessed - Grooming: Supported sitting Upper Body Bathing: Supervision/safety;Set up Where Assessed - Upper Body Bathing: Supported sitting Lower Body Bathing: Moderate assistance Where Assessed - Lower Body Bathing: Supported sit to stand Upper Body Dressing: Moderate assistance Where Assessed - Upper Body Dressing: Supported sitting Lower Body Dressing: Maximal assistance Where Assessed - Lower Body Dressing: Supported sit to Pharmacist, hospital: Minimal Dentist Method: Sit to Barista: Bedside commode Toileting - Clothing Manipulation and Hygiene: Moderate assistance Where Assessed - Toileting Clothing Manipulation and Hygiene: Sit to stand from 3-in-1 or toilet Transfers/Ambulation Related to ADLs: +2total (pt=60%) stand pivot bed to chair. unable to test ambulation secondary to dizziness    OT Diagnosis: Generalized  weakness;Paresis;Apraxia  OT Problem List: Decreased strength;Decreased range of motion;Decreased activity tolerance;Impaired balance (sitting and/or standing);Decreased coordination;Decreased knowledge of use of DME or AE;Decreased knowledge of precautions;Impaired UE functional use;Impaired sensation OT Treatment Interventions: Self-care/ADL training;Neuromuscular education;DME and/or AE instruction;Therapeutic activities;Patient/family education;Balance training   OT Goals Acute Rehab OT Goals OT Goal Formulation: With patient Time For Goal Achievement: 05/25/12 Potential to Achieve Goals: Good ADL Goals Pt Will Perform Eating: Independently;Sitting, edge of bed;Sitting, chair ADL Goal: Eating - Progress: Goal set today Pt Will Perform Grooming: Independently;Sitting at sink;Standing at sink ADL Goal: Grooming - Progress: Goal set today Pt Will Perform Upper Body Dressing: Independently;Sitting, chair;Sitting, bed ADL Goal: Upper Body Dressing - Progress: Goal set today Pt Will Transfer to Toilet: Independently;with modified independence;Ambulation ADL Goal: Toilet Transfer - Progress: Goal set today Pt Will Perform Toileting - Clothing Manipulation: Independently;Standing ADL Goal: Toileting - Clothing Manipulation - Progress: Goal set today Additional ADL Goal #1: Pt will be I with bil UE strengthening and coordination HEP in prep for increased I with ADLs ADL Goal: Additional Goal #1 - Progress: Goal set today  Visit Information  Last OT Received On: 05/18/12 Assistance Needed: +2    Subjective Data  Subjective: "I just feel so weak." Patient Stated Goal: Return home   Prior Functioning     Home Living Lives With: Alone Type of Home: Apartment Home Access: Stairs to enter Entrance Stairs-Number of Steps: 3 Entrance Stairs-Rails: Left Home Layout: One level Bathroom Shower/Tub: Forensic scientist: Standard Home Adaptive Equipment: None Prior  Function Level of Independence: Independent (sister gets groceries) Able to Take Stairs?: Yes Driving: Yes Vocation: On disability Communication Communication: No difficulties Dominant Hand: Right         Vision/Perception Vision - History Baseline Vision: Wears glasses  all the time Vision - Assessment Additional Comments: pt requested to defer testing due to increased drowsiness   Cognition  Cognition Overall Cognitive Status: Appears within functional limits for tasks assessed/performed Arousal/Alertness: Awake/alert Orientation Level: Appears intact for tasks assessed Behavior During Session: [facility] for tasks performed    Extremity/Trunk Assessment Right Upper Extremity Assessment RUE ROM/Strength/Tone: Deficits RUE ROM/Strength/Tone Deficits: 3/5 grip; 4/5 wrist; WFL elbow/shoulder RUE Sensation: WFL - Light Touch RUE Coordination:  (limited due to poor grip strength) Left Upper Extremity Assessment LUE ROM/Strength/Tone: Deficits LUE ROM/Strength/Tone Deficits: pt inconsistent with performance during testing and functional activity but feel this may be related to area of hemorrhage. Pt with 2+/5 shoulder and distally, including supination/pronation and grip. LUE Sensation: Deficits LUE Sensation Deficits: states he feels like he is holding something in his hand when hand is empty LUE Coordination: Deficits LUE Coordination Deficits: poor initiation as well as motor control/grading Right Lower Extremity Assessment RLE ROM/Strength/Tone: Deficits RLE ROM/Strength/Tone Deficits: 4+/5 Grossly RLE Sensation: WFL - Light Touch RLE Coordination: WFL - gross/fine motor Left Lower Extremity Assessment LLE ROM/Strength/Tone: Deficits LLE ROM/Strength/Tone Deficits: 3+/5 grossly LLE Sensation: WFL - Light Touch LLE Coordination: Deficits (Impaired heel shin)     Mobility Bed Mobility Bed Mobility: Supine to Sit;Sitting - Scoot to Edge of Bed Supine to Sit: 4: Min  assist Sitting - Scoot to Edge of Bed: 5: Supervision Details for Bed Mobility Assistance: (A) to elevate trunk OOB with cues for safety. Transfers Sit to Stand: 4: Min assist Stand to Sit: 4: Min assist Details for Transfer Assistance: (A) to initiate transfer with cues for hand placement.  +2 (A) to maintain balance with cues for step sequence and hand placement.  Limited mobility due to dizziness.     Exercise Other Exercises Other Exercises: AAROM: shoulder flexion/extension; elbow flexion/extension; wrist flexion/extension; supination/pronation; finger adduction/abduction. 5 reps each. encouraged to perform 10 reps x 3 times per day   Balance Balance Balance Assessed: Yes Static Sitting Balance Static Sitting - Balance Support: Feet supported Static Sitting - Level of Assistance: 5: Stand by assistance   End of Session OT - End of Session Activity Tolerance: Patient limited by fatigue Patient left: in chair;with call bell/phone within reach Nurse Communication: Mobility status  GO     Cleva Camero 05/18/2012, 3:34 PM

## 2012-05-19 ENCOUNTER — Inpatient Hospital Stay (HOSPITAL_COMMUNITY): Payer: Medicare Other

## 2012-05-19 DIAGNOSIS — I6789 Other cerebrovascular disease: Secondary | ICD-10-CM

## 2012-05-19 DIAGNOSIS — R51 Headache: Secondary | ICD-10-CM | POA: Diagnosis present

## 2012-05-19 DIAGNOSIS — I639 Cerebral infarction, unspecified: Secondary | ICD-10-CM | POA: Diagnosis present

## 2012-05-19 DIAGNOSIS — G936 Cerebral edema: Secondary | ICD-10-CM | POA: Diagnosis present

## 2012-05-19 DIAGNOSIS — I10 Essential (primary) hypertension: Secondary | ICD-10-CM | POA: Diagnosis present

## 2012-05-19 DIAGNOSIS — I619 Nontraumatic intracerebral hemorrhage, unspecified: Secondary | ICD-10-CM

## 2012-05-19 DIAGNOSIS — R519 Headache, unspecified: Secondary | ICD-10-CM | POA: Diagnosis present

## 2012-05-19 DIAGNOSIS — I6340 Cerebral infarction due to embolism of unspecified cerebral artery: Principal | ICD-10-CM

## 2012-05-19 NOTE — Progress Notes (Signed)
Stroke Team Progress Note  HISTORY Edward Weber is an 52 y.o. male who reports going to bed normal last evening 05/16/2012. Awakened today 05/17/2012 and felt drained. On attempting to go to the bathroom felt as if he was having difficulty walking. Then noted that he was having difficulty using his left arm. Patient also complains of a headache. Describes the headache as frontal and throbbing. Associated with nausea but no vomiting, photophobia or phonophobia. Patient was brought to [facility] ED. Initial BP was 163/99. Patient given Labetolol and Hydralazine for for BP. Head CT was performed showing a right basal ganglia hemorrhage. Patient was transferred to [facility] for further management. Has a history of HTN but was taken off of medication about a year ago. Patient was not a TPA candidate secondary to hemorrhage. He was admitted to the neuro ICU for further evaluation and treatment.  SUBJECTIVE No complaints.  OBJECTIVE Most recent Vital Signs: Filed Vitals:   05/19/12 0420 05/19/12 0500 05/19/12 0600 05/19/12 0807  BP:  157/84 165/94   Pulse:   59   Temp: 97.5 F (36.4 C)   97.9 F (36.6 C)  TempSrc: Oral   Oral  Resp:   20   Height:      Weight:      SpO2:   92%    CBG (last 3)  No results found for this basename: GLUCAP:3 in the last 72 hours  IV Fluid Intake:     . sodium chloride 75 mL/hr at 05/18/12 2300   MEDICATIONS    . doxepin  25 mg Oral QHS  . DULoxetine  60 mg Oral Daily  . gabapentin  100 mg Oral BID  . hydrochlorothiazide  25 mg Oral Q breakfast  . pantoprazole  40 mg Oral Q1200  . senna-docusate  1 tablet Oral BID  . sucralfate  1 g Oral TID WC & HS   PRN:  acetaminophen, acetaminophen, diphenhydrAMINE-zinc acetate, HYDROmorphone (DILAUDID) injection, hydrOXYzine, labetalol, ondansetron (ZOFRAN) IV, traMADol  Diet:  Cardiac thin liquids Activity:  As tolerated DVT Prophylaxis:  SCDs   CLINICALLY SIGNIFICANT STUDIES Basic Metabolic Panel: No results found for  this basename: NA:2,K:2,CL:2,CO2:2,GLUCOSE:2,BUN:2,CREATININE:2,CALCIUM:2,MG:2,PHOS:2 in the last 168 hours Liver Function Tests: No results found for this basename: AST:2,ALT:2,ALKPHOS:2,BILITOT:2,PROT:2,ALBUMIN:2 in the last 168 hours CBC: No results found for this basename: WBC:2,NEUTROABS:2,HGB:2,HCT:2,MCV:2,PLT:2 in the last 168 hours Coagulation: No results found for this basename: LABPROT:4,INR:4 in the last 168 hours Cardiac Enzymes: No results found for this basename: CKTOTAL:3,CKMB:3,CKMBINDEX:3,TROPONINI:3 in the last 168 hours Urinalysis: No results found for this basename: COLORURINE:2,APPERANCEUR:2,LABSPEC:2,PHURINE:2,GLUCOSEU:2,HGBUR:2,BILIRUBINUR:2,KETONESUR:2,PROTEINUR:2,UROBILINOGEN:2,NITRITE:2,LEUKOCYTESUR:2 in the last 168 hours  Lipid Panel     Component Value Date/Time   CHOL 178 05/18/2012 1008   TRIG 354* 05/18/2012 1008   HDL 20* 05/18/2012 1008   CHOLHDL 8.9 05/18/2012 1008   VLDL 71* 05/18/2012 1008   LDLCALC 87 05/18/2012 1008   HgbA1C  Lab Results  Component Value Date   HGBA1C 5.7* 05/18/2012   Urine Drug Screen:   No results found for this basename: labopia,  cocainscrnur,  labbenz,  amphetmu,  thcu,  labbarb    Alcohol Level: No results found for this basename: ETH:2 in the last 168 hours  CT of the brain   05/19/2012 Stable appearance of intraparenchymal hematoma in the right lentiform nucleus. 05/18/2012 Stable appearance of intraparenchymal hematoma in the right lentiform nucleus.     MRI of the brain  05/18/2012  1.  Acute right lentiform nuclei hemorrhage with estimated volume of 9 ml and  mild associated mass effect and edema.  No intraventricular or extra-axial hemorrhage. 2.  Acute to subacute appearing nodular infarct in the medial left periatrial white matter abutting the splenium of the corpus callosum.  No significant mass effect and no associated hemorrhage. 3.  Small chronic-appearing T2 hyperintense lacune/lesion in the left corona radiata with trace  associated hemosiderin. 4. The above could reflect chronic small vessel disease, but consider the possibility of a central embolic source, such as patent foramen ovale.   MRA of the brain  05/18/2012  Mild intracranial atherosclerosis.  Otherwise negative intracranial MRA.     2D Echocardiogram    Carotid Doppler    CXR    EKG     Therapy Recommendations CIR  Physical Exam   Pleasant middle aged african [place] male not in distress.Awake alert. Afebrile. Head is nontraumatic. Neck is supple without bruit. Hearing is normal. Cardiac exam no murmur or gallop. Lungs are clear to auscultation. Distal pulses are well felt.  Neurological Exam : Awake alert oriented x 3 normal speech and language.extraocular movements are full range without nystagmus. Fundi were not visualized. Pupils are equal reactive. Vision acuity and fields appear normal. Mild left lower face asymmetry. Tongue midline. No drift. Mild diminished fine finger movements on left.mild left upper and lower extremity drift with 4/5 strength. Orbits right over left upper extremity. Mild left grip weak.. Normal sensation . Normal coordination.   ASSESSMENT [name] is a 52 y.o. male presenting with difficulty walking, trouble using his left arm, and headache. Imaging confirms a left spleenium infarct and right basal ganglia infarct with sizeable hemorrhagic transformation. Infarcts felt to be embolic secondary to unknown etiology. On no antiplatelets prior to admission. Patient with resultant left hemiparesis and dysarthria. Work up underway.  Hypertension, highest BP 177/94, 163/99 on arrival. Taken off medications 1 month ago. HCTZ added yesterday. GERD Depression Hepatitis etoh use Marijuana use LDL 87 HgbA1c 5.7  Hospital day # 2  TREATMENT/PLAN  Change SBP goal to < 180  F/u Carotid doppler  F/u 2D echo TEE to look for embolic source. Arranged with [facility] for tomorrow.  If positive for PFO (patent  foramen ovale), check bilateral lower extremity venous dopplers to rule out DVT as possible source of stroke. xfer to the floor Rehab consult Decrease IVF  Annie Main, MSN, RN, ANVP-BC, ANP-BC, [facility] Pager: [phone] 05/19/2012 8:17 AM  I have personally obtained a history, examined the patient, evaluated imaging results, and formulated the assessment and plan of care. I agree with the above.   Delia Heady, MD Medical Director [facility] Pager: [phone] 05/19/2012 8:17 AM

## 2012-05-19 NOTE — Consult Note (Signed)
Physical Medicine and Rehabilitation Consult Reason for Consult: Right basal ganglia hemorrhage Referring Physician: Dr. Pearlean Brownie   HPI: Edward Weber is a 52 y.o. right-handed male with history of hypertension. Patient was independent prior to admission. Admitted 05/17/2012 a left-sided weakness and headache from [facility] emergency department. Initial blood pressure 163/99 received labetalol and hydralazine. MRI of the brain showed acute right lentiform nuclei hemorrhage 34 x 24 x 23 mm with surrounding edema. MRA of the head with atherosclerotic type changes. Echocardiogram and carotid Dopplers pending. Followup cranial CT scan pending. Physical and occupational therapy evaluations completed an ongoing with recommendations of physical medicine rehabilitation consult to consider inpatient rehabilitation services   Review of Systems  Musculoskeletal: Positive for back pain.  Neurological: Positive for dizziness and headaches.  Psychiatric/Behavioral: Positive for depression.  All other systems reviewed and are negative.   Past Medical History  Diagnosis Date  . Hypertension   . GERD (gastroesophageal reflux disease)   . Depression   . Hepatitis    History reviewed. No pertinent past surgical history. History reviewed. No pertinent family history. Social History:  reports that he has quit smoking. He does not have any smokeless tobacco history on file. He reports that he drinks about 5.4 ounces of alcohol per week. He reports that he uses illicit drugs (Marijuana). Allergies: No Known Allergies Medications Prior to Admission  Medication Sig Dispense Refill  . hydrOXYzine (ATARAX/VISTARIL) 25 MG tablet Take 25 mg by mouth 3 (three) times daily as needed. For itching      . lansoprazole (PREVACID) 15 MG capsule Take 15 mg by mouth daily as needed. For acid reflux      . naproxen sodium (ANAPROX) 220 MG tablet Take 440 mg by mouth 2 (two) times daily as needed. For headache         Home: Home Living Lives With: Alone Type of Home: Apartment Home Access: Stairs to enter Entrance Stairs-Number of Steps: 3 Entrance Stairs-Rails: Left Home Layout: One level Bathroom Shower/Tub: Forensic scientist: Standard Home Adaptive Equipment: None  Functional History: Prior Function Able to Take Stairs?: Yes Driving: Yes Vocation: On disability Functional Status:  Mobility: Bed Mobility Bed Mobility: Supine to Sit;Sitting - Scoot to Edge of Bed Supine to Sit: 4: Min assist Sitting - Scoot to [place] of Bed: 5: Supervision Transfers Transfers: Sit to Stand;Stand to [employer] Transfers Sit to Stand: 4: Min assist Stand to Sit: 4: Min assist Stand Pivot Transfers: 1: +2 Total assist Stand Pivot Transfers: Patient Percentage: 60% Ambulation/Gait Ambulation/Gait Assistance: Not tested (comment)    ADL: ADL Eating/Feeding: Minimal assistance Where Assessed - Eating/Feeding: Chair Grooming: Minimal assistance Where Assessed - Grooming: Supported sitting Upper Body Bathing: Supervision/safety;Set up Where Assessed - Upper Body Bathing: Supported sitting Lower Body Bathing: Moderate assistance Where Assessed - Lower Body Bathing: Supported sit to stand Upper Body Dressing: Moderate assistance Where Assessed - Upper Body Dressing: Supported sitting Lower Body Dressing: Maximal assistance Where Assessed - Lower Body Dressing: Supported sit to Pharmacist, hospital: Minimal assistance Toilet Transfer Method: Sit to Barista: Bedside commode Transfers/Ambulation Related to ADLs: +2total (pt=60%) stand pivot bed to chair. unable to test ambulation secondary to dizziness  Cognition: Cognition Overall Cognitive Status: Appears within functional limits for tasks assessed Arousal/Alertness: Awake/alert Orientation Level: Oriented X4 Cognition Overall Cognitive Status: Appears within functional limits for tasks  assessed/performed Arousal/Alertness: Awake/alert Orientation Level: Appears intact for tasks assessed Behavior During Session: [facility] for tasks performed  Blood  pressure 165/94, pulse 59, temperature 97.9 F (36.6 C), temperature source Oral, resp. rate 20, height 5\' 8"  (1.727 m), weight 79.3 kg (174 lb 13.2 oz), SpO2 92.00%. Physical Exam  Vitals reviewed. Constitutional: He is oriented to person, place, and time. He appears well-developed.  HENT:  Head: Normocephalic and atraumatic.  Right Ear: External ear normal.  Left Ear: External ear normal.  Eyes: Conjunctivae normal and EOM are normal. Pupils are equal, round, and reactive to light. Left eye exhibits no discharge.  Neck: Normal range of motion. Neck supple. No thyromegaly present.  Cardiovascular: Normal rate and regular rhythm.   Pulmonary/Chest: Effort normal and breath sounds normal. He has no wheezes.  Abdominal: Soft. Bowel sounds are normal. He exhibits no distension.  Musculoskeletal: He exhibits no edema.  Neurological: He is alert and oriented to person, place, and time.       Flat affect .Follows simple commands .Decreased fine motor skills. Right gaze preference. Mild left facial weakness. Speech slightly dysarthric. Sensation intact to lt and pain on the left side. LUE is  2-3/5. LLE 1-2 HF and KE, 2 ADF and APF. Reasonable insight and awareness. Remembered day, year, missed month by one. Remembered basic biographical information.    Results for orders placed during the hospital encounter of 05/17/12 (from the past 24 hour(s))  LIPID PANEL     Status: Abnormal   Collection Time   05/18/12 10:08 AM      Component Value Range   Cholesterol 178  0 - 200 mg/dL   Triglycerides 119 (*) <150 mg/dL   HDL 20 (*) >14 mg/dL   Total CHOL/HDL Ratio 8.9     VLDL 71 (*) 0 - 40 mg/dL   LDL Cholesterol 87  0 - 99 mg/dL  HEMOGLOBIN N8G     Status: Abnormal   Collection Time   05/18/12 10:08 AM      Component Value Range    Hemoglobin A1C 5.7 (*) <5.7 %   Mean Plasma Glucose 117 (*) <117 mg/dL   Ct Head Wo Contrast  05/19/2012  *RADIOLOGY REPORT*  Clinical Data: Follow-up bleed.  CT HEAD WITHOUT CONTRAST  Technique:  Contiguous axial images were obtained from the base of the skull through the vertex without contrast.  Comparison: MRI brain 05/18/2012  Findings: Focal acute hemorrhage again demonstrated in the right lentiform nucleus measuring about 1.4 x 3.5 cm.  Mild surrounding edema.  Allowing for differences in technique, this does not appear significantly changed.  There is some mass effect with effacement of the right lateral ventricles.  No significant midline shift. The acute infarct demonstrated in the left parietal white matter/corpus callosum is not visualized on CT.  Calcifications are demonstrated along the falx and tentorium which may be dystrophic. No abnormal extra-axial fluid collections.  Gray-white matter junctions remain distinct.  No ventricular dilatation.  IMPRESSION: Stable appearance of intraparenchymal hematoma in the right lentiform nucleus.   Original Report Authenticated By: Burman Nieves, M.D.    Mr [facility] Wo Contrast  05/18/2012  *RADIOLOGY REPORT*  Clinical Data:  52 year old male with right basal ganglia hemorrhage discovered at [facility]  Left upper extremity weakness, headache, unsteady gait, nausea.  Comparison: None.  MRI HEAD WITHOUT CONTRAST  Technique: Multiplanar, multiecho pulse sequences of the brain and surrounding structures were obtained according to standard protocol without intravenous contrast.  Findings: Hemorrhage in the right lentiform nuclei with dark T2* signal, mostly isointense T1 signal, and heterogeneously increased and decreased T2 signal encompasses  34 x 24 x 23 mm.  Surrounding edema fairly limited to the deep white matter capsules and basal ganglia.  Mild mass effect on the right lateral ventricle.  No midline shift or ventriculomegaly.No  intraventricular hemorrhage.  Associated artifact from blood products on diffusion in the right basal ganglia.  No larger area of restricted diffusion in the right hemisphere.  However, there is a 13 mm globular area of mild to moderately restricted diffusion in the left medial periatrial white matter bordering the splenium of the corpus callosum (series 3 image 17).  Associated mild to moderate T2 and FLAIR hyperintensity.  No associated hemorrhage at this site.  No mass effect.  No other restricted diffusion.  Mild periventricular nonspecific T2 and FLAIR white matter hyperintensity elsewhere. One focus in the left corona radiata does have suggestion of minor chronic blood products (series 8 image 14).  Minimal T2 heterogeneity in the brainstem.  Mid brain, cerebellum, and cervicomedullary junction within normal limits.  Negative pituitary.  Normal bone marrow signal.  Degenerative changes in the cervical spine. Major intracranial vascular flow voids are preserved.  Visualized orbit soft tissues are within normal limits.  Visualized paranasal sinuses and mastoids are clear.  Negative scalp soft tissues.  IMPRESSION: 1.  Acute right lentiform nuclei hemorrhage with estimated volume of 9 ml and mild associated mass effect and edema.  No intraventricular or extra-axial hemorrhage. 2.  Acute to subacute appearing nodular infarct in the medial left periatrial white matter abutting the splenium of the corpus callosum.  No significant mass effect and no associated hemorrhage. 3.  Small chronic-appearing T2 hyperintense lacune/lesion in the left corona radiata with trace associated hemosiderin. 4. The above could reflect chronic small vessel disease, but consider the possibility of a central embolic source, such as patent foramen ovale. 5.  MRA findings are below.  MRA HEAD WITHOUT CONTRAST  Technique: Angiographic images of the Circle of Willis were obtained using MRA technique without  intravenous contrast.  Findings:  Antegrade flow in the posterior circulation codominant distal vertebral arteries.  Normal PICA origins.  Normal vertebrobasilar junction.  Mild basilar artery irregularity without stenosis.  SCA and PCA origins are within normal limits.  Posterior communicating arteries are diminutive or absent.  Bilateral PCA branches are within normal limits.  Antegrade flow in both ICA siphons. Mild bilateral ICA irregularity.  No ICA stenosis.  Ophthalmic artery origins are within normal limits.  Patent carotid termini.  MCA and ACA origins are within normal limits.  Codominant ACA A1 segment.  Diminutive anterior communicating artery.  Visualized ACA branches are within normal limits.  Visualized bilateral MCA branches are within normal limits.  IMPRESSION: Mild intracranial atherosclerosis.  Otherwise negative intracranial MRA.   Original Report Authenticated By: Erskine Speed, M.D.    Mr Brain Wo Contrast  05/18/2012  *RADIOLOGY REPORT*  Clinical Data:  52 year old male with right basal ganglia hemorrhage discovered at [facility].  Left upper extremity weakness, headache, unsteady gait, nausea.  Comparison: None.  MRI HEAD WITHOUT CONTRAST  Technique: Multiplanar, multiecho pulse sequences of the brain and surrounding structures were obtained according to standard protocol without intravenous contrast.  Findings: Hemorrhage in the right lentiform nuclei with dark T2* signal, mostly isointense T1 signal, and heterogeneously increased and decreased T2 signal encompasses 34 x 24 x 23 mm.  Surrounding edema fairly limited to the deep white matter capsules and basal ganglia.  Mild mass effect on the right lateral ventricle.  No midline shift or ventriculomegaly.No intraventricular  hemorrhage.  Associated artifact from blood products on diffusion in the right basal ganglia.  No larger area of restricted diffusion in the right hemisphere.  However, there is a 13 mm globular area of mild to moderately restricted  diffusion in the left medial periatrial white matter bordering the splenium of the corpus callosum (series 3 image 17).  Associated mild to moderate T2 and FLAIR hyperintensity.  No associated hemorrhage at this site.  No mass effect.  No other restricted diffusion.  Mild periventricular nonspecific T2 and FLAIR white matter hyperintensity elsewhere. One focus in the left corona radiata does have suggestion of minor chronic blood products (series 8 image 14).  Minimal T2 heterogeneity in the brainstem.  Mid brain, cerebellum, and cervicomedullary junction within normal limits.  Negative pituitary.  Normal bone marrow signal.  Degenerative changes in the cervical spine. Major intracranial vascular flow voids are preserved.  Visualized orbit soft tissues are within normal limits.  Visualized paranasal sinuses and mastoids are clear.  Negative scalp soft tissues.  IMPRESSION: 1.  Acute right lentiform nuclei hemorrhage with estimated volume of 9 ml and mild associated mass effect and edema.  No intraventricular or extra-axial hemorrhage. 2.  Acute to subacute appearing nodular infarct in the medial left periatrial white matter abutting the splenium of the corpus callosum.  No significant mass effect and no associated hemorrhage. 3.  Small chronic-appearing T2 hyperintense lacune/lesion in the left corona radiata with trace associated hemosiderin. 4. The above could reflect chronic small vessel disease, but consider the possibility of a central embolic source, such as patent foramen ovale. 5.  MRA findings are below.  MRA HEAD WITHOUT CONTRAST  Technique: Angiographic images of the Circle of Willis were obtained using MRA technique without  intravenous contrast.  Findings: Antegrade flow in the posterior circulation codominant distal vertebral arteries.  Normal PICA origins.  Normal vertebrobasilar junction.  Mild basilar artery irregularity without stenosis.  SCA and PCA origins are within normal limits.  Posterior  communicating arteries are diminutive or absent.  Bilateral PCA branches are within normal limits.  Antegrade flow in both ICA siphons. Mild bilateral ICA irregularity.  No ICA stenosis.  Ophthalmic artery origins are within normal limits.  Patent carotid termini.  MCA and ACA origins are within normal limits.  Codominant ACA A1 segment.  Diminutive anterior communicating artery.  Visualized ACA branches are within normal limits.  Visualized bilateral MCA branches are within normal limits.  IMPRESSION: Mild intracranial atherosclerosis.  Otherwise negative intracranial MRA.   Original Report Authenticated By: Erskine Speed, M.D.     Assessment/Plan: Diagnosis: Right basal ganglia hemorrhage 1. Does the need for close, 24 hr/day medical supervision in concert with the patient's rehab needs make it unreasonable for this patient to be served in a less intensive setting? Yes 2. Co-Morbidities requiring supervision/potential complications: htn, headache 3. Due to bladder management, bowel management, safety, skin/wound care, disease management, medication administration and patient education, does the patient require 24 hr/day rehab nursing? Yes 4. Does the patient require coordinated care of a physician, rehab nurse, PT (1-2 hrs/day, 5 days/week), OT (1-2 hrs/day, 5 days/week) and SLP (1-2 hrs/day, 5 days/week) to address physical and functional deficits in the context of the above medical diagnosis(es)? Yes Addressing deficits in the following areas: balance, endurance, locomotion, strength, transferring, bowel/bladder control, bathing, dressing, feeding, grooming, toileting, cognition and speech 5. Can the patient actively participate in an intensive therapy program of at least 3 hrs of therapy per day at least 5  days per week? Yes 6. The potential for patient to make measurable gains while on inpatient rehab is excellent 7. Anticipated functional outcomes upon discharge from inpatient rehab are mod I  To  supervision with PT, mod I to min assist with OT, mod I with SLP. 8. Estimated rehab length of stay to reach the above functional goals is: 2 to 2.5 weeks 9. Does the patient have adequate social supports to accommodate these discharge functional goals? Yes and Potentially 10. Anticipated D/C setting: Home 11. Anticipated post D/C treatments: HH therapy 12. Overall Rehab/Functional Prognosis: excellent  RECOMMENDATIONS: This patient's condition is appropriate for continued rehabilitative care in the following setting: CIR Patient has agreed to participate in recommended program. Yes Note that insurance prior authorization may be required for reimbursement for recommended care.  Comment:Rehab RN to follow up.   Ivory Broad, MD     05/19/2012

## 2012-05-19 NOTE — Progress Notes (Signed)
  Echocardiogram 2D Echocardiogram has been performed.  [place]ian [place] 05/19/2012, 10:47 AM

## 2012-05-19 NOTE — Progress Notes (Signed)
Physical Therapy Treatment Patient Details Name: Edward Weber MRN: [id number] DOB: 04-27-1960 Today's Date: 05/19/2012 Time: 1334-1400 PT Time Calculation (min): 26 min  PT Assessment / Plan / Recommendation Comments on Treatment Session  52 y.o. male presenting with difficulty walking, trouble using his left arm, and headache. Imaging confirms a right basal ganglia hemorrhage. Progressing well. Continues to demonstrate coordination, strength and balance deficits affecting safety and independence with mobility. Agree with CIR for d/c plan.     Follow Up Recommendations  CIR     Does the patient have the potential to tolerate intense rehabilitation     Barriers to Discharge        Equipment Recommendations  Rolling walker with 5" wheels    Recommendations for Other Services    Frequency Min 4X/week   Plan Discharge plan remains appropriate;Frequency remains appropriate    Precautions / Restrictions Precautions Precautions: Fall Restrictions Weight Bearing Restrictions: No   Pertinent Vitals/Pain C/o left medial ankle pain most painful with weight bearing and DF, painful to palpation, RN made aware    Mobility  Bed Mobility Bed Mobility: Not assessed Transfers Transfers: Sit to Stand;Stand to Sit Sit to Stand: 4: Min guard;With upper extremity assist;Without upper extremity assist;4: Min assist Stand to Sit: 4: Min guard;With upper extremity assist;With armrests Details for Transfer Assistance: without upper extremity assist pt needing minA to stand up and cues for sequencing, with upper extremity assist on arm rests of chair pt needing mingaurdA for stability Ambulation/Gait Ambulation/Gait Assistance: 4: Min assist Ambulation Distance (Feet): 150 Feet Assistive device: None Ambulation/Gait Assistance Details: staggered gait needing minA stability assist; c/o pain in left ankle (said he hurt it when he fell prior to admission) Gait Pattern: Decreased stance time -  left;Decreased weight shift to left;Decreased step length - left General Gait Details: slight difficulty coordinating step on left Stairs: No    Exercises General Exercises - Upper Extremity Shoulder Flexion: AAROM;10 reps;Seated;Both;AROM (AAROM on left) General Exercises - Lower Extremity Long Arc Quad: AROM;Both;10 reps;Seated;Limitations Long [employer] Limitations: cues for technique    PT Goals Acute Rehab PT Goals PT Goal: Sit to Supine/Side - Progress: Met PT Goal: Sit to Stand - Progress: Progressing toward goal PT Goal: Stand to Sit - Progress: Progressing toward goal PT Transfer Goal: Bed to Chair/Chair to Bed - Progress: Progressing toward goal PT Goal: Stand - Progress: Progressing toward goal PT Goal: Ambulate - Progress: Progressing toward goal  Visit Information  Last PT Received On: 05/19/12 Assistance Needed: +1    Subjective Data  Subjective: Ineed a new gown. (pt sitting on the toilet with urine all around him on the floor)   Cognition  Cognition Overall Cognitive Status: Impaired Area of Impairment: Safety/judgement;Problem solving Arousal/Alertness: Awake/alert Safety/Judgement: Impulsive;Decreased safety judgement for tasks assessed Problem Solving: difficulty trying to get his gown off needing minA for sequencing    Balance  Balance Balance Assessed: Yes Static Standing Balance Static Standing - Balance Support: No upper extremity supported Static Standing - Level of Assistance:  (mingaurdA) Single Leg Stance - Right Leg: 2  (minA) Dynamic Standing Balance Dynamic Standing - Balance Support: Bilateral upper extremity supported Dynamic Standing - Level of Assistance: 4: Min assist Dynamic Standing - Balance Activities: Lateral lean/weight shifting;Reaching for objects Dynamic Standing - Comments: weight shift to right and attempting stepping and target activities with LLE (pt unable to maintain SLS on RLE and difficulty coordinating activity with LLE  causing instability); minA for adequate weight shift to left  and facilitation for tall posture  End of Session PT - End of Session Equipment Utilized During Treatment: Gait belt Activity Tolerance: Patient tolerated treatment well Patient left: with call bell/phone within reach;in bed Nurse Communication: Mobility status;Patient requests pain meds   GP     [facility] HELEN 05/19/2012, 3:26 PM

## 2012-05-19 NOTE — Progress Notes (Signed)
I met with patient at bedside. Discussed inpt rehab venue and pt is in agreement to admission once medical workup complete. His girlfriend, Meriam Sprague, will be staying with him at discharge. Noted TEE planned tomorrow. Depending on timing of completion of TEE, we may have bed available to admit him tomorrow. I will follow up tomorrow. Discussed with Annie Main, GNP. [phone]

## 2012-05-19 NOTE — Clinical Documentation Improvement (Signed)
GENERIC DOCUMENTATION CLARIFICATION QUERY  THIS DOCUMENT IS NOT A PERMANENT PART OF THE MEDICAL RECOR  Please update your documentation within the medical record to reflect your response to this query.                                                                                        05/19/12   Dear Dr.Glorious Flicker,  In a better effort to capture your patient's severity of illness, reflect appropriate length of stay and utilization of resources, a review of the patient medical record has revealed the following indicators.   Based on your clinical judgment, please clarify and document in a progress note and/or discharge summary the clinical condition associated with the following supporting information: In responding to this query please exercise your independent judgment.  The fact that a query is asked, does not imply that any particular answer is desired or expected.   Hello Dr. Pearlean Brownie!  Edward Weber was admitted with a right basal ganglia infarct with a sizeable hemorrhagic infarct. Per MRI of Head on 2/5:  "Acute right lentiform nuclei hemorrhage with estimated volume of 9 ml and mild associated mass effect and edema." If possible, please help clarify if you agree with with MRI findings of "edema". Thank you!  Possible Clinical Conditions?  - Cerebral Edema  - Vasogenic Edema  - Other condition (please document in the progress notes and/or discharge summary)  - Cannot Clinically determine at this time    **You may use possible, probable, or suspect with inpatient documentation. possible, probable, suspected diagnoses MUST be documented at the time of discharge!   Reviewed: additional documentation in the medical record   Thank You,  Saul Fordyce  Clinical Documentation Specialist: [phone] Pager  Health Information Management [place]  I agree see documentation in problem list

## 2012-05-19 NOTE — Progress Notes (Signed)
VASCULAR LAB PRELIMINARY  PRELIMINARY  PRELIMINARY  PRELIMINARY  Carotid Dopplers completed.    Preliminary report:  There is no significant ICA stenosis.  Vertebral artery flow is antegrade.  Edward Weber, RVT 05/19/2012, 11:30 AM

## 2012-05-20 ENCOUNTER — Encounter (HOSPITAL_COMMUNITY): Payer: Self-pay | Admitting: *Deleted

## 2012-05-20 ENCOUNTER — Inpatient Hospital Stay (HOSPITAL_COMMUNITY)
Admission: RE | Admit: 2012-05-20 | Discharge: 2012-05-27 | DRG: 945 | Disposition: A | Discharge status: Home / Self Care | Payer: Medicare Other | Source: Intra-hospital | Attending: Physical Medicine & Rehabilitation | Admitting: Physical Medicine & Rehabilitation

## 2012-05-20 ENCOUNTER — Encounter (HOSPITAL_COMMUNITY)
Admission: AD | Disposition: A | Discharge status: Rehabilitation Facility | Payer: Self-pay | Source: Other Acute Inpatient Hospital | Attending: Neurology

## 2012-05-20 DIAGNOSIS — I6789 Other cerebrovascular disease: Secondary | ICD-10-CM

## 2012-05-20 DIAGNOSIS — I619 Nontraumatic intracerebral hemorrhage, unspecified: Secondary | ICD-10-CM | POA: Diagnosis present

## 2012-05-20 DIAGNOSIS — Q2112 Patent foramen ovale: Secondary | ICD-10-CM

## 2012-05-20 DIAGNOSIS — G936 Cerebral edema: Secondary | ICD-10-CM

## 2012-05-20 DIAGNOSIS — F3289 Other specified depressive episodes: Secondary | ICD-10-CM

## 2012-05-20 DIAGNOSIS — Z5189 Encounter for other specified aftercare: Principal | ICD-10-CM

## 2012-05-20 DIAGNOSIS — Z7401 Bed confinement status: Secondary | ICD-10-CM

## 2012-05-20 DIAGNOSIS — G8190 Hemiplegia, unspecified affecting unspecified side: Secondary | ICD-10-CM | POA: Diagnosis present

## 2012-05-20 DIAGNOSIS — Q211 Atrial septal defect: Secondary | ICD-10-CM

## 2012-05-20 DIAGNOSIS — M5430 Sciatica, unspecified side: Secondary | ICD-10-CM

## 2012-05-20 DIAGNOSIS — Q2111 Secundum atrial septal defect: Secondary | ICD-10-CM

## 2012-05-20 DIAGNOSIS — I10 Essential (primary) hypertension: Secondary | ICD-10-CM | POA: Diagnosis present

## 2012-05-20 DIAGNOSIS — Z87891 Personal history of nicotine dependence: Secondary | ICD-10-CM

## 2012-05-20 DIAGNOSIS — K219 Gastro-esophageal reflux disease without esophagitis: Secondary | ICD-10-CM

## 2012-05-20 DIAGNOSIS — F329 Major depressive disorder, single episode, unspecified: Secondary | ICD-10-CM

## 2012-05-20 DIAGNOSIS — M109 Gout, unspecified: Secondary | ICD-10-CM

## 2012-05-20 HISTORY — PX: TEE WITHOUT CARDIOVERSION: SHX5443

## 2012-05-20 SURGERY — ECHOCARDIOGRAM, TRANSESOPHAGEAL
Anesthesia: Moderate Sedation

## 2012-05-20 MED ORDER — GABAPENTIN 100 MG PO CAPS
100.0000 mg | ORAL_CAPSULE | Freq: Two times a day (BID) | ORAL | Status: DC
  Filled 2012-05-20: qty 1

## 2012-05-20 MED ORDER — SENNOSIDES-DOCUSATE SODIUM 8.6-50 MG PO TABS
1.0000 | ORAL_TABLET | Freq: Two times a day (BID) | ORAL | Status: DC
  Filled 2012-05-20: qty 1

## 2012-05-20 MED ORDER — DULOXETINE HCL 60 MG PO CPEP
60.0000 mg | ORAL_CAPSULE | Freq: Every day | ORAL | Status: DC
  Administered 2012-05-21 – 2012-05-27 (×7): 60 mg via ORAL
  Filled 2012-05-20 (×10): qty 1

## 2012-05-20 MED ORDER — LIDOCAINE VISCOUS 2 % MT SOLN
OROMUCOSAL | Status: DC | PRN
  Administered 2012-05-20: 20 mL via OROMUCOSAL

## 2012-05-20 MED ORDER — DULOXETINE HCL 60 MG PO CPEP: 60.0000 mg | ORAL_CAPSULE | Freq: Every day | ORAL | Status: DC

## 2012-05-20 MED ORDER — SENNOSIDES-DOCUSATE SODIUM 8.6-50 MG PO TABS
1.0000 | ORAL_TABLET | Freq: Two times a day (BID) | ORAL | Status: DC
  Administered 2012-05-20 – 2012-05-27 (×14): 1 via ORAL
  Filled 2012-05-20 (×12): qty 1
  Filled 2012-05-20: qty 2
  Filled 2012-05-20 (×2): qty 1

## 2012-05-20 MED ORDER — FENTANYL CITRATE 0.05 MG/ML IJ SOLN
INTRAMUSCULAR | Status: AC
  Filled 2012-05-20: qty 2

## 2012-05-20 MED ORDER — SUCRALFATE 1 G PO TABS
1.0000 g | ORAL_TABLET | Freq: Three times a day (TID) | ORAL | Status: DC
  Filled 2012-05-20 (×3): qty 1

## 2012-05-20 MED ORDER — PANTOPRAZOLE SODIUM 40 MG PO TBEC
40.0000 mg | DELAYED_RELEASE_TABLET | Freq: Every day | ORAL | Status: DC
  Administered 2012-05-21 – 2012-05-27 (×7): 40 mg via ORAL
  Filled 2012-05-20 (×8): qty 1

## 2012-05-20 MED ORDER — MIDAZOLAM HCL 10 MG/2ML IJ SOLN
INTRAMUSCULAR | Status: DC | PRN
  Administered 2012-05-20: 2 mg via INTRAVENOUS

## 2012-05-20 MED ORDER — PANTOPRAZOLE SODIUM 40 MG PO TBEC: 40.0000 mg | DELAYED_RELEASE_TABLET | Freq: Every day | ORAL | Status: DC

## 2012-05-20 MED ORDER — ONDANSETRON HCL 4 MG/2ML IJ SOLN: 4.0000 mg | Freq: Four times a day (QID) | INTRAMUSCULAR | Status: DC | PRN

## 2012-05-20 MED ORDER — FENTANYL CITRATE 0.05 MG/ML IJ SOLN
INTRAMUSCULAR | Status: DC | PRN
  Administered 2012-05-20: 25 ug via INTRAVENOUS

## 2012-05-20 MED ORDER — DOXEPIN HCL 25 MG PO CAPS
25.0000 mg | ORAL_CAPSULE | Freq: Every day | ORAL | Status: DC
  Filled 2012-05-20: qty 1

## 2012-05-20 MED ORDER — ACETAMINOPHEN 325 MG PO TABS: 325.0000 mg | ORAL_TABLET | ORAL | Status: DC | PRN

## 2012-05-20 MED ORDER — HYDROCHLOROTHIAZIDE 25 MG PO TABS
25.0000 mg | ORAL_TABLET | Freq: Every day | ORAL | Status: DC
  Administered 2012-05-21 – 2012-05-27 (×7): 25 mg via ORAL
  Filled 2012-05-20 (×10): qty 1

## 2012-05-20 MED ORDER — MIDAZOLAM HCL 5 MG/ML IJ SOLN
INTRAMUSCULAR | Status: AC
  Filled 2012-05-20: qty 2

## 2012-05-20 MED ORDER — TRAMADOL HCL 50 MG PO TABS
50.0000 mg | ORAL_TABLET | Freq: Four times a day (QID) | ORAL | Status: DC | PRN
  Administered 2012-05-20 – 2012-05-22 (×3): 100 mg via ORAL
  Administered 2012-05-24: 50 mg via ORAL
  Administered 2012-05-24 – 2012-05-25 (×2): 100 mg via ORAL
  Filled 2012-05-20 (×4): qty 2

## 2012-05-20 MED ORDER — GABAPENTIN 100 MG PO CAPS
100.0000 mg | ORAL_CAPSULE | Freq: Two times a day (BID) | ORAL | Status: DC
  Administered 2012-05-20 – 2012-05-26 (×12): 100 mg via ORAL
  Filled 2012-05-20 (×15): qty 1

## 2012-05-20 MED ORDER — COLCHICINE 0.6 MG PO TABS
0.6000 mg | ORAL_TABLET | Freq: Two times a day (BID) | ORAL | Status: DC
  Administered 2012-05-20 – 2012-05-27 (×14): 0.6 mg via ORAL
  Filled 2012-05-20 (×19): qty 1

## 2012-05-20 MED ORDER — LIDOCAINE VISCOUS 2 % MT SOLN
OROMUCOSAL | Status: AC
  Filled 2012-05-20: qty 15

## 2012-05-20 MED ORDER — SUCRALFATE 1 G PO TABS
1.0000 g | ORAL_TABLET | Freq: Three times a day (TID) | ORAL | Status: DC
  Administered 2012-05-20 – 2012-05-27 (×28): 1 g via ORAL
  Filled 2012-05-20 (×36): qty 1

## 2012-05-20 MED ORDER — DOXEPIN HCL 25 MG PO CAPS
25.0000 mg | ORAL_CAPSULE | Freq: Every day | ORAL | Status: DC
  Administered 2012-05-20 – 2012-05-26 (×7): 25 mg via ORAL
  Filled 2012-05-20 (×10): qty 1

## 2012-05-20 MED ORDER — HYDROCHLOROTHIAZIDE 25 MG PO TABS
25.0000 mg | ORAL_TABLET | Freq: Every day | ORAL | Status: DC
  Filled 2012-05-20: qty 1

## 2012-05-20 MED ORDER — ACETAMINOPHEN 325 MG PO TABS
325.0000 mg | ORAL_TABLET | ORAL | Status: DC | PRN
  Administered 2012-05-22: 650 mg via ORAL
  Filled 2012-05-20: qty 2

## 2012-05-20 MED ORDER — ONDANSETRON HCL 4 MG PO TABS: 4.0000 mg | ORAL_TABLET | Freq: Four times a day (QID) | ORAL | Status: DC | PRN

## 2012-05-20 MED ORDER — TRAMADOL HCL 50 MG PO TABS
50.0000 mg | ORAL_TABLET | Freq: Four times a day (QID) | ORAL | Status: DC | PRN
  Administered 2012-05-20: 100 mg via ORAL
  Filled 2012-05-20: qty 2

## 2012-05-20 NOTE — Progress Notes (Signed)
Bilateral:  No evidence of DVT, superficial thrombosis, or Baker's Cyst.   

## 2012-05-20 NOTE — Progress Notes (Signed)
Stroke Team Progress Note  HISTORY Edward Weber is an 51 y.o. male who reports going to bed normal last evening 05/16/2012. Awakened today 05/17/2012 and felt drained. On attempting to go to the bathroom felt as if he was having difficulty walking. Then noted that he was having difficulty using his left arm. Patient also complains of a headache. Describes the headache as frontal and throbbing. Associated with nausea but no vomiting, photophobia or phonophobia. Patient was brought to [place] ED. Initial BP was 163/99. Patient given Labetolol and Hydralazine for for BP. Head CT was performed showing a right basal ganglia hemorrhage. Patient was transferred to [facility] for further management. Has a history of HTN but was taken off of medication about a year ago. Patient was not a TPA candidate secondary to hemorrhage. He was admitted to the neuro ICU for further evaluation and treatment.  SUBJECTIVE No complaints. Scheduled for TEE today at 11a.  OBJECTIVE Most recent Vital Signs: Filed Vitals:   05/20/12 0400 05/20/12 0500 05/20/12 0600 05/20/12 0700  BP: 164/96 167/90 129/94 172/94  Pulse: 77 81 82 94  Temp:      TempSrc:      Resp: 23 17 25 23  Height:      Weight:      SpO2: 95% 96% 95% 96%   CBG (last 3)  No results found for this basename: GLUCAP:3 in the last 72 hours  IV Fluid Intake:     . sodium chloride 50 mL/hr at 05/20/12 0706   MEDICATIONS    . doxepin  25 mg Oral QHS  . DULoxetine  60 mg Oral Daily  . gabapentin  100 mg Oral BID  . hydrochlorothiazide  25 mg Oral Q breakfast  . pantoprazole  40 mg Oral Q1200  . senna-docusate  1 tablet Oral BID  . sucralfate  1 g Oral TID WC & HS   PRN:  acetaminophen, acetaminophen, diphenhydrAMINE-zinc acetate, hydrOXYzine, labetalol, ondansetron (ZOFRAN) IV, traMADol  Diet: NPO Activity:  As tolerated DVT Prophylaxis:  SCDs   CLINICALLY SIGNIFICANT STUDIES Lipid Panel     Component Value Date/Time   CHOL 178 05/18/2012 1008    TRIG 354* 05/18/2012 1008   HDL 20* 05/18/2012 1008   CHOLHDL 8.9 05/18/2012 1008   VLDL 71* 05/18/2012 1008   LDLCALC 87 05/18/2012 1008   HgbA1C  Lab Results  Component Value Date   HGBA1C 5.7* 05/18/2012   Urine Drug Screen:   No results found for this basename: labopia,  cocainscrnur,  labbenz,  amphetmu,  thcu,  labbarb    Alcohol Level: No results found for this basename: ETH:2 in the last 168 hours  CT of the brain   05/19/2012 Stable appearance of intraparenchymal hematoma in the right lentiform nucleus. 05/18/2012 Stable appearance of intraparenchymal hematoma in the right lentiform nucleus.     MRI of the brain  05/18/2012  1.  Acute right lentiform nuclei hemorrhage with estimated volume of 9 ml and mild associated mass effect and edema.  No intraventricular or extra-axial hemorrhage. 2.  Acute to subacute appearing nodular infarct in the medial left periatrial white matter abutting the splenium of the corpus callosum.  No significant mass effect and no associated hemorrhage. 3.  Small chronic-appearing T2 hyperintense lacune/lesion in the left corona radiata with trace associated hemosiderin. 4. The above could reflect chronic small vessel disease, but consider the possibility of a central embolic source, such as patent foramen ovale.   MRA of the brain  05/18/2012    Mild intracranial atherosclerosis.  Otherwise negative intracranial MRA.     2D Echocardiogram  estimated ejection fraction was in the range of 40% to 45%. Diffuse hypokinesis. No source of embolus.  TEE  Carotid Doppler  No evidence of hemodynamically significant internal carotid artery stenosis. Vertebral artery flow is antegrade.   CXR    EKG     Therapy Recommendations CIR  Physical Exam   Pleasant middle aged african american male not in distress.Awake alert. Afebrile. Head is nontraumatic. Neck is supple without bruit. Hearing is normal. Cardiac exam no murmur or gallop. Lungs are clear to auscultation. Distal pulses  are well felt.  Neurological Exam : Awake alert oriented x 3 normal speech and language.extraocular movements are full range without nystagmus. Fundi were not visualized. Pupils are equal reactive. Vision acuity and fields appear normal. Mild left lower face asymmetry. Tongue midline. No drift. Mild diminished fine finger movements on left.mild left upper and lower extremity drift with 4/5 strength. Orbits right over left upper extremity. Mild left grip weak.. Normal sensation . Normal coordination.   ASSESSMENT [name] is a 51 y.o. male presenting with difficulty walking, trouble using his left arm, and headache. Imaging confirms a left splenium infarct and right basal ganglia infarct with sizeable hemorrhagic transformation. Infarcts felt to be embolic secondary to unknown etiology. On no antiplatelets prior to admission. Patient with resultant left hemiparesis and dysarthria. Work up underway.  Hypertension, highest BP 177/94, 163/99 on arrival. Taken off medications 1 month ago. HCTZ added yesterday. GERD Depression Hepatitis etoh use Marijuana use LDL 87 HgbA1c 5.7  Hospital day # 3  TREATMENT/PLAN  SBP goal  < 180 TEE to look for embolic source. Arranged with [facility] for today at 11a. If positive for PFO (patent foramen ovale), check bilateral lower extremity venous dopplers to rule out DVT as possible source of stroke. Discharge to rehab after TEE  SHARON BIBY, MSN, RN, ANVP-BC, ANP-BC, GNP-BC [facility] Pager: [phone] 05/20/2012 7:53 AM  I have personally obtained a history, examined the patient, evaluated imaging results, and formulated the assessment and plan of care. I agree with the above.   Pramod Sethi, MD Medical Director [facility] Pager: [phone] 05/20/2012 7:53 AM    

## 2012-05-20 NOTE — Progress Notes (Signed)
Noted pt is for TEE at 11 am today. We will plan admit to inpt rehab today after recovery from TEE. RN is aware and pt is in agreement.  Annie Main, [facility] is aware.[phone]

## 2012-05-20 NOTE — H&P (View-Only) (Signed)
Stroke Team Progress Note  HISTORY Edward Weber is an 52 y.o. male who reports going to bed normal last evening 05/16/2012. Awakened today 05/17/2012 and felt drained. On attempting to go to the bathroom felt as if he was having difficulty walking. Then noted that he was having difficulty using his left arm. Patient also complains of a headache. Describes the headache as frontal and throbbing. Associated with nausea but no vomiting, photophobia or phonophobia. Patient was brought to [facility] ED. Initial BP was 163/99. Patient given Labetolol and Hydralazine for for BP. Head CT was performed showing a right basal ganglia hemorrhage. Patient was transferred to [facility] for further management. Has a history of HTN but was taken off of medication about a year ago. Patient was not a TPA candidate secondary to hemorrhage. He was admitted to the neuro ICU for further evaluation and treatment.  SUBJECTIVE No complaints. Scheduled for TEE today at 11a.  OBJECTIVE Most recent Vital Signs: Filed Vitals:   05/20/12 0400 05/20/12 0500 05/20/12 0600 05/20/12 0700  BP: 164/96 167/90 129/94 172/94  Pulse: 77 81 82 94  Temp:      TempSrc:      Resp: 23 17 25 23   Height:      Weight:      SpO2: 95% 96% 95% 96%   CBG (last 3)  No results found for this basename: GLUCAP:3 in the last 72 hours  IV Fluid Intake:     . sodium chloride 50 mL/hr at 05/20/12 0706   MEDICATIONS    . doxepin  25 mg Oral QHS  . DULoxetine  60 mg Oral Daily  . gabapentin  100 mg Oral BID  . hydrochlorothiazide  25 mg Oral Q breakfast  . pantoprazole  40 mg Oral Q1200  . senna-docusate  1 tablet Oral BID  . sucralfate  1 g Oral TID WC & HS   PRN:  acetaminophen, acetaminophen, diphenhydrAMINE-zinc acetate, hydrOXYzine, labetalol, ondansetron (ZOFRAN) IV, traMADol  Diet: NPO Activity:  As tolerated DVT Prophylaxis:  SCDs   CLINICALLY SIGNIFICANT STUDIES Lipid Panel     Component Value Date/Time   CHOL 178 05/18/2012 1008    TRIG 354* 05/18/2012 1008   HDL 20* 05/18/2012 1008   CHOLHDL 8.9 05/18/2012 1008   VLDL 71* 05/18/2012 1008   LDLCALC 87 05/18/2012 1008   HgbA1C  Lab Results  Component Value Date   HGBA1C 5.7* 05/18/2012   Urine Drug Screen:   No results found for this basename: labopia,  cocainscrnur,  labbenz,  amphetmu,  thcu,  labbarb    Alcohol Level: No results found for this basename: ETH:2 in the last 168 hours  CT of the brain   05/19/2012 Stable appearance of intraparenchymal hematoma in the right lentiform nucleus. 05/18/2012 Stable appearance of intraparenchymal hematoma in the right lentiform nucleus.     MRI of the brain  05/18/2012  1.  Acute right lentiform nuclei hemorrhage with estimated volume of 9 ml and mild associated mass effect and edema.  No intraventricular or extra-axial hemorrhage. 2.  Acute to subacute appearing nodular infarct in the medial left periatrial white matter abutting the splenium of the corpus callosum.  No significant mass effect and no associated hemorrhage. 3.  Small chronic-appearing T2 hyperintense lacune/lesion in the left corona radiata with trace associated hemosiderin. 4. The above could reflect chronic small vessel disease, but consider the possibility of a central embolic source, such as patent foramen ovale.   MRA of the brain  05/18/2012  Mild intracranial atherosclerosis.  Otherwise negative intracranial MRA.     2D Echocardiogram  estimated ejection fraction was in the range of 40% to 45%. Diffuse hypokinesis. No source of embolus.  TEE  Carotid Doppler  No evidence of hemodynamically significant internal carotid artery stenosis. Vertebral artery flow is antegrade.   CXR    EKG     Therapy Recommendations CIR  Physical Exam   Pleasant middle aged african [place] male not in distress.Awake alert. Afebrile. Head is nontraumatic. Neck is supple without bruit. Hearing is normal. Cardiac exam no murmur or gallop. Lungs are clear to auscultation. Distal pulses  are well felt.  Neurological Exam : Awake alert oriented x 3 normal speech and language.extraocular movements are full range without nystagmus. Fundi were not visualized. Pupils are equal reactive. Vision acuity and fields appear normal. Mild left lower face asymmetry. Tongue midline. No drift. Mild diminished fine finger movements on left.mild left upper and lower extremity drift with 4/5 strength. Orbits right over left upper extremity. Mild left grip weak.. Normal sensation . Normal coordination.   ASSESSMENT Edward Weber is a 52 y.o. male presenting with difficulty walking, trouble using his left arm, and headache. Imaging confirms a left splenium infarct and right basal ganglia infarct with sizeable hemorrhagic transformation. Infarcts felt to be embolic secondary to unknown etiology. On no antiplatelets prior to admission. Patient with resultant left hemiparesis and dysarthria. Work up underway.  Hypertension, highest BP 177/94, 163/99 on arrival. Taken off medications 1 month ago. HCTZ added yesterday. GERD Depression Hepatitis etoh use Marijuana use LDL 87 HgbA1c 5.7  Hospital day # 3  TREATMENT/PLAN  SBP goal  < 180 TEE to look for embolic source. Arranged with [facility] for today at 11a. If positive for PFO (patent foramen ovale), check bilateral lower extremity venous dopplers to rule out DVT as possible source of stroke. Discharge to rehab after TEE  Annie Main, MSN, RN, ANVP-BC, ANP-BC, GNP-BC [facility] Pager: [phone] 05/20/2012 7:53 AM  I have personally obtained a history, examined the patient, evaluated imaging results, and formulated the assessment and plan of care. I agree with the above.   Delia Heady, MD Medical Director [facility] Pager: [phone] 05/20/2012 7:53 AM

## 2012-05-20 NOTE — PMR Pre-admission (Signed)
PMR Admission Coordinator Pre-Admission Assessment  Patient: Edward Weber is an 52 y.o., male MRN: [id number] DOB: [birth year] Height: 5\' 8"  (172.7 cm) Weight: 79.3 kg (174 lb 13.2 oz)              Insurance Information Edward Weber:     PPO:      Edward Weber:      Edward Weber:      80/20: yes     OTHER: no Edward Weber PRIMARY: medicare a and b      Policy#: [id number]    Subscriber: pt benefits:  Phone #: visionshare     Name: 05/20/12 Eff. Date: 02/11/05     Deduct: $1216      Out of Pocket Max: none      Life Max: none CIR: 100%      SNF: 20 full days LBD none Outpatient: 80%     Co-Pay: 20% Home Health: 100%      Co-Pay: none DME: 80%     Co-Pay: 20% Providers: pt choice  SECONDARY: medicaid      Policy#: [id number]      Subscriber: pt  Emergency Contact Information Contact Information    Name Relation Home Work Mobile   Edward Weber Significant other [phone]       Current Medical History  Patient Admitting Diagnosis: right basal ganglia hemorrhage  History of Present Illness: Edward Weber is a 52 y.o. right-handed male with history of hypertension. Patient was independent prior to admission. Disabled since 2005 from his [place] per pt. Admitted 05/17/2012 with  left-sided weakness and headache from [facility] emergency department. Initial blood pressure 163/99 received labetalol and hydralazine. MRI of the brain showed acute right lentiform nuclei hemorrhage 34 x 24 x 23 mm with surrounding edema. MRA of the head with atherosclerotic type changes. TEE pending 05/20/12 to complete workup.  NIH Total: 4   Past Medical History  Past Medical History  Diagnosis Date  . Hypertension   . GERD (gastroesophageal reflux disease)   . Depression   . Hepatitis     Family History  family history is not on file.  Prior Rehab/Hospitalizations:none   Current Medications  Current facility-administered medications:0.9 %  sodium chloride infusion, , Intravenous, Continuous, Edward Benton, NP, Last Rate:  50 mL/hr at 05/20/12 0706;  acetaminophen (TYLENOL) suppository 650 mg, 650 mg, Rectal, Q4H PRN, Edward Farr, MD;  acetaminophen (TYLENOL) tablet 650 mg, 650 mg, Oral, Q4H PRN, Edward Farr, MD diphenhydrAMINE-zinc acetate (BENADRYL) 2-0.1 % cream, , Topical, TID PRN, Edward Farr, MD, 1 application at 05/18/12 0159;  doxepin (SINEQUAN) capsule 25 mg, 25 mg, Oral, QHS, Edward Farr, MD, 25 mg at 05/19/12 2211;  DULoxetine (CYMBALTA) DR capsule 60 mg, 60 mg, Oral, Daily, Edward Farr, MD, 60 mg at 05/19/12 1013;  gabapentin (NEURONTIN) capsule 100 mg, 100 mg, Oral, BID, Edward Farr, MD, 100 mg at 05/19/12 2211 hydrochlorothiazide (HYDRODIURIL) tablet 25 mg, 25 mg, Oral, Q breakfast, Edward Benton, NP, 25 mg at 05/20/12 0754;  hydrOXYzine (ATARAX/VISTARIL) tablet 25 mg, 25 mg, Oral, QID PRN, Edward Farr, MD;  labetalol (NORMODYNE,TRANDATE) injection 10-40 mg, 10-40 mg, Intravenous, Q10 min PRN, Edward Farr, MD, 20 mg at 05/18/12 2109;  ondansetron (ZOFRAN) injection 4 mg, 4 mg, Intravenous, Q6H PRN, Edward Farr, MD pantoprazole (PROTONIX) EC tablet 40 mg, 40 mg, Oral, Q1200, Edward Weber, Edward Weber, 40 mg at 05/19/12 1326;  senna-docusate (Senokot-S) tablet 1 tablet, 1 tablet, Oral, BID, Edward Farr, MD, 1 tablet at 05/19/12 2211;  sucralfate (  CARAFATE) tablet 1 g, 1 g, Oral, TID WC & HS, Edward Farr, MD, 1 g at 05/20/12 0754;  traMADol Janean Sark) tablet 50-100 mg, 50-100 mg, Oral, Q6H PRN, Edward Farr, MD, 100 mg at 05/19/12 1420  Patients Current Diet: Regular diet with thin liquids  Precautions / Restrictions Precautions Precautions: Fall Restrictions Weight Bearing Restrictions: No   Prior Activity Level   Home Assistive Devices / Equipment Home Assistive Devices/Equipment: Cane (specify quad or straight) Home Adaptive Equipment: None  Prior Functional Level Prior Function Level of Independence: Independent (sister gets groceries) Able to  Take Stairs?: Yes Driving: Yes Vocation: On disability Comments: disabled since 2005   Current Functional Level Cognition  Arousal/Alertness: Awake/alert Overall Cognitive Status: Appears within functional limits for tasks assessed Overall Cognitive Status: Impaired Orientation Level: Oriented X4 Safety/Judgement: Impulsive;Decreased safety judgement for tasks assessed    Extremity Assessment (includes Sensation/Coordination)  RUE ROM/Strength/Tone: Deficits RUE ROM/Strength/Tone Deficits: 3/5 grip; 4/5 wrist; WFL elbow/shoulder RUE Sensation: WFL - Light Touch RUE Coordination:  (limited due to poor grip strength)  RLE ROM/Strength/Tone: Deficits RLE ROM/Strength/Tone Deficits: 4+/5 Grossly RLE Sensation: WFL - Light Touch RLE Coordination: WFL - gross/fine motor    ADLs  Eating/Feeding: Minimal assistance Where Assessed - Eating/Feeding: Chair Grooming: Minimal assistance Where Assessed - Grooming: Supported sitting Upper Body Bathing: Supervision/safety;Set up Where Assessed - Upper Body Bathing: Supported sitting Lower Body Bathing: Moderate assistance Where Assessed - Lower Body Bathing: Supported sit to stand Upper Body Dressing: Moderate assistance Where Assessed - Upper Body Dressing: Supported sitting Lower Body Dressing: Maximal assistance Where Assessed - Lower Body Dressing: Supported sit to Pharmacist, hospital: Minimal Dentist Method: Sit to Barista: Bedside commode Toileting - Clothing Manipulation and Hygiene: Moderate assistance Where Assessed - Toileting Clothing Manipulation and Hygiene: Sit to stand from 3-in-1 or toilet Transfers/Ambulation Related to ADLs: +2total (pt=60%) stand pivot bed to chair. unable to test ambulation secondary to dizziness    Mobility  Bed Mobility: Not assessed Supine to Sit: 4: Min assist Sitting - Scoot to Edge of Bed: 5: Supervision    Transfers  Transfers: Sit to  Stand;Stand to Sit Sit to Stand: 4: Min guard;With upper extremity assist;Without upper extremity assist;4: Min assist Stand to Sit: 4: Min guard;With upper extremity assist;With armrests Stand Pivot Transfers: 1: +2 Total assist Stand Pivot Transfers: Patient Percentage: 60%    Ambulation / Gait / Stairs / Wheelchair Mobility  Ambulation/Gait Ambulation/Gait Assistance: 4: Min Environmental consultant (Feet): 150 Feet Assistive device: None Ambulation/Gait Assistance Details: staggered gait needing minA stability assist; c/o pain in left ankle (said he hurt it when he fell prior to admission) Gait Pattern: Decreased stance time - left;Decreased weight shift to left;Decreased step length - left General Gait Details: slight difficulty coordinating step on left Stairs: No    Posture / Balance Static Sitting Balance Static Sitting - Balance Support: Feet supported Static Sitting - Level of Assistance: 5: Stand by assistance Static Standing Balance Static Standing - Balance Support: No upper extremity supported Static Standing - Level of Assistance:  (mingaurdA) Single Leg Stance - Right Leg: 2  (minA) Dynamic Standing Balance Dynamic Standing - Balance Support: Bilateral upper extremity supported Dynamic Standing - Level of Assistance: 4: Min assist Dynamic Standing - Balance Activities: Lateral lean/weight shifting;Reaching for objects Dynamic Standing - Comments: weight shift to right and attempting stepping and target activities with LLE (pt unable to maintain SLS on RLE and difficulty coordinating activity with LLE causing  instability); minA for adequate weight shift to left and facilitation for tall posture    Special needs/care consideration Bowel mgmt: continent Bladder mgmt:continent urinal    Previous Home Environment Living Arrangements: Alone Lives With: Alone Available Help at Discharge: Other (Comment) (girlfriend available ) Type of Home: Apartment Home Layout: One  level Home Access: Stairs to enter Entrance Stairs-Rails: Left Entrance Stairs-Number of Steps: 3 Bathroom Shower/Tub: Counselling psychologist: Yes How Accessible: Accessible via walker Home Care Services: No  Discharge Living Setting Plans for Discharge Living Setting: Apartment Type of Home at Discharge: Apartment Discharge Home Layout: One level Discharge Home Access: Stairs to enter Entrance Stairs-Rails: Left Entrance Stairs-Number of Steps: 3 to 4 Discharge Bathroom Shower/Tub: Tub/shower unit;Curtain Discharge Bathroom Toilet: Standard Discharge Bathroom Accessibility: Yes How Accessible: Accessible via walker Do you have any problems obtaining your medications?: No  Social/Family/Support Systems Patient Roles: Parent (dtr is 73 yo) Contact Information: Judeth Porch, girlfriend Anticipated Caregiver: Meriam Sprague Anticipated Industrial/product designer Information: cell is [phone] Ability/Limitations of Caregiver: no limitations Caregiver Availability: 24/7 Discharge Plan Discussed with Primary Caregiver: Yes Is Caregiver In Agreement with Plan?: Yes Does Caregiver/Family have Issues with Lodging/Transportation while Pt is in Rehab?: No    Goals/Additional Needs Patient/Family Goal for Rehab: Mod I to supervision PT, Mod I to min assist OT, Mod I SLP Expected length of stay: ELOS 2 weeks Dietary Needs: Regular diet with thin liquids Pt/Family Agrees to Admission and willing to participate: Yes Program Orientation Provided & Reviewed with Pt/Caregiver Including Roles  & Responsibilities: Yes   Decrease burden of Care through IP rehab admission: n/a   Possible need for SNF placement upon discharge: not anticipated   Patient Condition: This patient's condition remains as documented in the Consult dated 05/19/12, in which the Rehabilitation Physician determined and documented that the patient's condition is appropriate for  intensive rehabilitative care in an inpatient rehabilitation facility.  Preadmission Screen Completed By:  Clois Dupes, 05/20/2012 9:27 AM ______________________________________________________________________   Discussed status with Dr. Riley Kill on 05/20/12 at  [phone] and received telephone approval for admission today.  Admission Coordinator:  Clois Dupes, time 1914 Date 05/20/2012.

## 2012-05-20 NOTE — Op Note (Signed)
PFO present as tested by injection of agitated saline with few bubbles seen in LA  Full report to follow.

## 2012-05-20 NOTE — Progress Notes (Signed)
Pt d/cd to Rehab. Report given. Moved to room 4040. Phone, charger, glasses and earphones transferred with pt.

## 2012-05-20 NOTE — Discharge Summary (Signed)
Stroke Discharge Summary  Patient ID: Edward Weber   MRN: [id number]      DOB: [birth year]  Date of Admission: 05/17/2012 Date of Discharge: 05/20/2012  Attending Physician:  Darcella Cheshire, MD, Stroke MD  Consulting Physician(s):    Faith Rogue, MD (Physical Medicine & Rehabtilitation)  Patient's PCP:  Duncan Dull, MD  Discharge Diagnoses:  Principal Problem:  *Stroke, bilateral, acute, embolic - left splenium ischemic infarct and right basal ganglia ischemic infarct with sizeable hemorrhagic transformation, embolic from unknown etiology Active Problems:  Hemiplegia, unspecified, affecting nondominant side  Intracerebral hemorrhage  Hypertension  Headache  Cytotoxic brain edema  PFO (patent foramen ovale) BMI  Body mass index is 26.58 kg/(m^2).   Past Medical History  Diagnosis Date  . Hypertension   . GERD (gastroesophageal reflux disease)   . Depression   . Hepatitis    History reviewed. No pertinent past surgical history.  Medications to be continued on Rehab   . doxepin  25 mg Oral QHS  . DULoxetine  60 mg Oral Daily  . gabapentin  100 mg Oral BID  . hydrochlorothiazide  25 mg Oral Q breakfast  . pantoprazole  40 mg Oral Q1200  . senna-docusate  1 tablet Oral BID  . sucralfate  1 g Oral TID WC & HS   LABORATORY STUDIES Lipid Panel    Component Value Date/Time   CHOL 178 05/18/2012 1008   TRIG 354* 05/18/2012 1008   HDL 20* 05/18/2012 1008   CHOLHDL 8.9 05/18/2012 1008   VLDL 71* 05/18/2012 1008   LDLCALC 87 05/18/2012 1008   HgbA1C  Lab Results  Component Value Date   HGBA1C 5.7* 05/18/2012   SIGNIFICANT DIAGNOSTIC STUDIES CT of the brain  05/19/2012 Stable appearance of intraparenchymal hematoma in the right lentiform nucleus.  05/18/2012 Stable appearance of intraparenchymal hematoma in the right lentiform nucleus.  MRI of the brain 05/18/2012 1. Acute right lentiform nuclei hemorrhage with estimated volume of 9 ml and mild associated mass effect and edema.  No intraventricular or extra-axial hemorrhage. 2. Acute to subacute appearing nodular infarct in the medial left periatrial white matter abutting the splenium of the corpus callosum. No significant mass effect and no associated hemorrhage. 3. Small chronic-appearing T2 hyperintense lacune/lesion in the left corona radiata with trace associated hemosiderin. 4. The above could reflect chronic small vessel disease, but consider the possibility of a central embolic source, such as patent foramen ovale.  MRA of the brain 05/18/2012 Mild intracranial atherosclerosis. Otherwise negative intracranial MRA.  2D Echocardiogram estimated ejection fraction was in the range of 40% to 45%. Diffuse hypokinesis. No source of embolus.  TEE PFO present as tested by injection of agitated saline with few bubbles seen in LA  Carotid Doppler No evidence of hemodynamically significant internal carotid artery stenosis. Vertebral artery flow is antegrade.  Lower Extremity Dopplers No evidence of DVT, superficial thrombosis, or Baker's Cyst.  History of Present Illness  Edward Weber is an 52 y.o. male who reports going to bed normal last evening 05/16/2012. Awakened today 05/17/2012 and felt drained. On attempting to go to the bathroom felt as if he was having difficulty walking. Then noted that he was having difficulty using his left arm. Patient also complains of a headache. Describes the headache as frontal and throbbing. Associated with nausea but no vomiting, photophobia or phonophobia. Patient was brought to [facility] ED. Initial BP was 163/99. Patient given Labetolol and Hydralazine for for BP. Head CT was performed  showing a right basal ganglia hemorrhage. Patient was transferred to [facility] for further management. Has a history of HTN but was taken off of medication about a year ago. Patient was not a TPA candidate secondary to hemorrhage. He was admitted to the neuro ICU for further evaluation and treatment.  Hospital Course   Initial dx of hemorrhage was clarified when MRI revealed acute bilateral ischemic infarcts with hemorrhagic transformation in the larger basal ganglia infarct. Infarct was felt to be embolic, though no source was found. He was found to have a PFO. LE dopplers were negative for VTE. He was not an antiplatelet candidate secondary to the hemorrhage. While patient has a history of hypertension, BP was not excessively elevated. He had been on BP med in the past. He was started on HCTZ in the hospital.   Patient with resultant left hemiparesis and dysarthria. Physical therapy, occupational therapy and speech therapy evaluated patient. All agreed inpatient rehab is needed. CIR bed is available today and patient will be transferred there.  Discharge Exam  Blood pressure 99/50, pulse 89, temperature 98.2 F (36.8 C), temperature source Oral, resp. rate 19, height 5\' 8"  (1.727 m), weight 79.3 kg (174 lb 13.2 oz), SpO2 98.00%. Pleasant middle aged african [place] male not in distress.Awake alert. Afebrile. Head is nontraumatic. Neck is supple without bruit. Hearing is normal. Cardiac exam no murmur or gallop. Lungs are clear to auscultation. Distal pulses are well felt.  Neurological Exam : Awake alert oriented x 3 normal speech and language.extraocular movements are full range without nystagmus. Fundi were not visualized. Pupils are equal reactive. Vision acuity and fields appear normal. Mild left lower face asymmetry. Tongue midline. No drift. Mild diminished fine finger movements on left.mild left upper and lower extremity drift with 4/5 strength. Orbits right over left upper extremity. Mild left grip weak.. Normal sensation . Normal coordination.   Discharge Diet  Cardiac thin liquids  Discharge Plan  Disposition:  Transfer to [facility] for ongoing PT, OT and ST  Ongoing risk factor control by Primary Care Physician. Risk factor recommendations:  Hypertension target range  130-140/70-80 Smoking cessation  Follow-up TULLO,TERESA, MD in 1 month.  Follow-up with Dr. Delia Heady in 2 months.  30 minutes were spent preparing discharge.  Signed Annie Main, AVNP, ANP-BC, [facility] Nurse Practitioner 05/20/2012, 2:20 PM  I have personally examined this patient, reviewed pertinent data and developed the plan of care. I agree with above.    Delia Heady, MD Medical Director [facility] Pager: [phone] 05/20/2012 5:10 PM

## 2012-05-20 NOTE — Progress Notes (Signed)
Pt given verbal and written education on TEE. He is able to teach back.

## 2012-05-20 NOTE — H&P (Signed)
Physical Medicine and Rehabilitation Admission H&P  No chief complaint on file.  :  HPI: Edward Weber is a 52 y.o. right-handed male with history of hypertension. Patient was independent prior to admission. Admitted 05/17/2012 a left-sided weakness and headache from [facility]. Initial blood pressure 163/99 received labetalol and hydralazine. MRI of the brain showed acute right lentiform nuclei hemorrhage 34 x 24 x 23 mm with surrounding edema. MRA of the head with atherosclerotic type changes. Echocardiogram with ejection fraction 45% grade 1 diastolic dysfunction . Carotid Dopplers with no significant ICA stenosis. Followup cranial CT scan 05/19/2012 stable appearance. Neurology services followup patient did not receive TPA secondary to hemorrhage. TEE completed 05/20/2012 positive PFO and venous Dopplers lower extremities completed that were negative. Physical and occupational therapy evaluations completed an ongoing with recommendations of physical medicine rehabilitation consult to consider inpatient rehabilitation services  Review of Systems  Musculoskeletal: Positive for back pain.  Neurological: Positive for dizziness and headaches.  Psychiatric/Behavioral: Positive for depression.  All other systems reviewed and are negative  Past Medical History   Diagnosis  Date   .  Hypertension    .  GERD (gastroesophageal reflux disease)    .  Depression    .  Hepatitis     History reviewed. No pertinent past surgical history.  History reviewed. No pertinent family history.  Social History: reports that he has quit smoking. He does not have any smokeless tobacco history on file. He reports that he drinks about 5.4 ounces of alcohol per week. He reports that he uses illicit drugs (Marijuana).  Allergies: No Known Allergies  Medications Prior to Admission   Medication  Sig  Dispense  Refill   .  hydrOXYzine (ATARAX/VISTARIL) 25 MG tablet  Take 25 mg by mouth 3 (three)  times daily as needed. For itching     .  lansoprazole (PREVACID) 15 MG capsule  Take 15 mg by mouth daily as needed. For acid reflux     .  naproxen sodium (ANAPROX) 220 MG tablet  Take 440 mg by mouth 2 (two) times daily as needed. For headache      Home:  Home Living  Lives With: Alone  Type of Home: Apartment  Home Access: Stairs to enter  Entrance Stairs-Number of Steps: 3  Entrance Stairs-Rails: Left  Home Layout: One level  Bathroom Shower/Tub: Sports administrator: Standard  Home Adaptive Equipment: None  Functional History:  Prior Function  Able to Take Stairs?: Yes  Driving: Yes  Vocation: On disability  Functional Status:  Mobility:  Bed Mobility  Bed Mobility: Supine to Sit;Sitting - Scoot to Edge of Bed  Supine to Sit: 4: Min assist  Sitting - Scoot to [place] of Bed: 5: Supervision  Transfers  Transfers: Sit to Stand;Stand to [employer] Transfers  Sit to Stand: 4: Min assist  Stand to Sit: 4: Min assist  Stand Pivot Transfers: 1: +2 Total assist  Stand Pivot Transfers: Patient Percentage: 60%  Ambulation/Gait  Ambulation/Gait Assistance: Not tested (comment)   ADL:  ADL  Eating/Feeding: Minimal assistance  Where Assessed - Eating/Feeding: Chair  Grooming: Minimal assistance  Where Assessed - Grooming: Supported sitting  Upper Body Bathing: Supervision/safety;Set up  Where Assessed - Upper Body Bathing: Supported sitting  Lower Body Bathing: Moderate assistance  Where Assessed - Lower Body Bathing: Supported sit to stand  Upper Body Dressing: Moderate assistance  Where Assessed - Upper Body Dressing: Supported sitting  Lower Body Dressing: Maximal  assistance  Where Assessed - Lower Body Dressing: Supported sit to stand  Toilet Transfer: Minimal assistance  Toilet Transfer Method: Sit to stand  Toilet Transfer Equipment: Bedside commode  Transfers/Ambulation Related to ADLs: +2total (pt=60%) stand pivot bed to chair. unable to test  ambulation secondary to dizziness  Cognition:  Cognition  Overall Cognitive Status: Appears within functional limits for tasks assessed  Arousal/Alertness: Awake/alert  Orientation Level: Oriented X4  Cognition  Overall Cognitive Status: Impaired  Area of Impairment: Safety/judgement;Problem solving  Arousal/Alertness: Awake/alert  Orientation Level: Appears intact for tasks assessed  Behavior During Session: [facility] for tasks performed  Safety/Judgement: Impulsive;Decreased safety judgement for tasks assessed  Problem Solving: difficulty trying to get his gown off needing minA for sequencing    Blood pressure 155/102, pulse 63, temperature 97.9 F (36.6 C), temperature source Oral, resp. rate 18, height 5\' 8"  (1.727 m), weight 79.3 kg (174 lb 13.2 oz), SpO2 96.00%.  Physical Exam  Vitals reviewed.  Constitutional: He is oriented to person, place, and time. He appears well-developed.  HENT: dentition fair Head: Normocephalic and atraumatic.  Right Ear: External ear normal.  Left Ear: External ear normal.  Eyes: Conjunctivae normal and EOM are normal. Pupils are equal, round, and reactive to light. Left eye exhibits no discharge.  Neck: Normal range of motion. Neck supple. No thyromegaly present.  Cardiovascular: Normal rate and regular rhythm. No murmurs rubs gallops Pulmonary/Chest: Effort normal and breath sounds normal. He has no wheezes.  Abdominal: Soft. Bowel sounds are normal. He exhibits no distension. Non tender Musculoskeletal: He exhibits no edema. Full PROM Neurological: He is alert and oriented to person, place, and time.  Flat affect .Follows simple commands .Decreased fine motor skills. Right gaze preference. Mild left facial weakness. Speech slightly dysarthric but intelligible, low volume. Sensation intact to lt and pain on the left side. LUE is 2-3/5 deltoid, triceps, WF, HI. LLE 1-2 HF and KE, 2 ADF and APF. Reasonable insight and awareness. Remembered day, year, month.   Recalled basic biographical information  Skin: scattered areas of hyperpigmentation Results for orders placed during the hospital encounter of 05/17/12 (from the past 48 hour(s))   MRSA PCR SCREENING Status: Normal    Collection Time    05/17/12 6:43 PM   Component  Value  Range  Comment    MRSA by PCR  NEGATIVE  NEGATIVE    LIPID PANEL Status: Abnormal    Collection Time    05/18/12 10:08 AM   Component  Value  Range  Comment    Cholesterol  178  0 - 200 mg/dL     Triglycerides  119 (*)  <150 mg/dL     HDL  20 (*)  >14 mg/dL     Total CHOL/HDL Ratio  8.9      VLDL  71 (*)  0 - 40 mg/dL     LDL Cholesterol  87  0 - 99 mg/dL    HEMOGLOBIN N8G Status: Abnormal    Collection Time    05/18/12 10:08 AM   Component  Value  Range  Comment    Hemoglobin A1C  5.7 (*)  <5.7 %     Mean Plasma Glucose  117 (*)  <117 mg/dL     Ct Head Wo Contrast  05/19/2012 *RADIOLOGY REPORT* Clinical Data: Follow-up bleed. CT HEAD WITHOUT CONTRAST Technique: Contiguous axial images were obtained from the base of the skull through the vertex without contrast. Comparison: MRI brain 05/18/2012 Findings: Focal acute hemorrhage again demonstrated  in the right lentiform nucleus measuring about 1.4 x 3.5 cm. Mild surrounding edema. Allowing for differences in technique, this does not appear significantly changed. There is some mass effect with effacement of the right lateral ventricles. No significant midline shift. The acute infarct demonstrated in the left parietal white matter/corpus callosum is not visualized on CT. Calcifications are demonstrated along the falx and tentorium which may be dystrophic. No abnormal extra-axial fluid collections. Gray-white matter junctions remain distinct. No ventricular dilatation. IMPRESSION: Stable appearance of intraparenchymal hematoma in the right lentiform nucleus. Original Report Authenticated By: Burman Nieves, M.D.  Mr [facility] Wo Contrast  05/18/2012 *RADIOLOGY REPORT* Clinical Data:  52 year old male with right basal ganglia hemorrhage discovered at [facility]. Left upper extremity weakness, headache, unsteady gait, nausea. Comparison: None. MRI HEAD WITHOUT CONTRAST Technique: Multiplanar, multiecho pulse sequences of the brain and surrounding structures were obtained according to standard protocol without intravenous contrast. Findings: Hemorrhage in the right lentiform nuclei with dark T2* signal, mostly isointense T1 signal, and heterogeneously increased and decreased T2 signal encompasses 34 x 24 x 23 mm. Surrounding edema fairly limited to the deep white matter capsules and basal ganglia. Mild mass effect on the right lateral ventricle. No midline shift or ventriculomegaly.No intraventricular hemorrhage. Associated artifact from blood products on diffusion in the right basal ganglia. No larger area of restricted diffusion in the right hemisphere. However, there is a 13 mm globular area of mild to moderately restricted diffusion in the left medial periatrial white matter bordering the splenium of the corpus callosum (series 3 image 17). Associated mild to moderate T2 and FLAIR hyperintensity. No associated hemorrhage at this site. No mass effect. No other restricted diffusion. Mild periventricular nonspecific T2 and FLAIR white matter hyperintensity elsewhere. One focus in the left corona radiata does have suggestion of minor chronic blood products (series 8 image 14). Minimal T2 heterogeneity in the brainstem. Mid brain, cerebellum, and cervicomedullary junction within normal limits. Negative pituitary. Normal bone marrow signal. Degenerative changes in the cervical spine. Major intracranial vascular flow voids are preserved. Visualized orbit soft tissues are within normal limits. Visualized paranasal sinuses and mastoids are clear. Negative scalp soft tissues. IMPRESSION: 1. Acute right lentiform nuclei hemorrhage with estimated volume of 9 ml and mild associated mass effect  and edema. No intraventricular or extra-axial hemorrhage. 2. Acute to subacute appearing nodular infarct in the medial left periatrial white matter abutting the splenium of the corpus callosum. No significant mass effect and no associated hemorrhage. 3. Small chronic-appearing T2 hyperintense lacune/lesion in the left corona radiata with trace associated hemosiderin. 4. The above could reflect chronic small vessel disease, but consider the possibility of a central embolic source, such as patent foramen ovale. 5. MRA findings are below. MRA HEAD WITHOUT CONTRAST Technique: Angiographic images of the Circle of Willis were obtained using MRA technique without intravenous contrast. Findings: Antegrade flow in the posterior circulation codominant distal vertebral arteries. Normal PICA origins. Normal vertebrobasilar junction. Mild basilar artery irregularity without stenosis. SCA and PCA origins are within normal limits. Posterior communicating arteries are diminutive or absent. Bilateral PCA branches are within normal limits. Antegrade flow in both ICA siphons. Mild bilateral ICA irregularity. No ICA stenosis. Ophthalmic artery origins are within normal limits. Patent carotid termini. MCA and ACA origins are within normal limits. Codominant ACA A1 segment. Diminutive anterior communicating artery. Visualized ACA branches are within normal limits. Visualized bilateral MCA branches are within normal limits. IMPRESSION: Mild intracranial atherosclerosis. Otherwise negative intracranial MRA. Original Report  Authenticated By: Erskine Speed, M.D.  Mr Brain Wo Contrast  05/18/2012 *RADIOLOGY REPORT* Clinical Data: 52 year old male with right basal ganglia hemorrhage discovered at [facility]. Left upper extremity weakness, headache, unsteady gait, nausea. Comparison: None. MRI HEAD WITHOUT CONTRAST Technique: Multiplanar, multiecho pulse sequences of the brain and surrounding structures were obtained according to  standard protocol without intravenous contrast. Findings: Hemorrhage in the right lentiform nuclei with dark T2* signal, mostly isointense T1 signal, and heterogeneously increased and decreased T2 signal encompasses 34 x 24 x 23 mm. Surrounding edema fairly limited to the deep white matter capsules and basal ganglia. Mild mass effect on the right lateral ventricle. No midline shift or ventriculomegaly.No intraventricular hemorrhage. Associated artifact from blood products on diffusion in the right basal ganglia. No larger area of restricted diffusion in the right hemisphere. However, there is a 13 mm globular area of mild to moderately restricted diffusion in the left medial periatrial white matter bordering the splenium of the corpus callosum (series 3 image 17). Associated mild to moderate T2 and FLAIR hyperintensity. No associated hemorrhage at this site. No mass effect. No other restricted diffusion. Mild periventricular nonspecific T2 and FLAIR white matter hyperintensity elsewhere. One focus in the left corona radiata does have suggestion of minor chronic blood products (series 8 image 14). Minimal T2 heterogeneity in the brainstem. Mid brain, cerebellum, and cervicomedullary junction within normal limits. Negative pituitary. Normal bone marrow signal. Degenerative changes in the cervical spine. Major intracranial vascular flow voids are preserved. Visualized orbit soft tissues are within normal limits. Visualized paranasal sinuses and mastoids are clear. Negative scalp soft tissues. IMPRESSION: 1. Acute right lentiform nuclei hemorrhage with estimated volume of 9 ml and mild associated mass effect and edema. No intraventricular or extra-axial hemorrhage. 2. Acute to subacute appearing nodular infarct in the medial left periatrial white matter abutting the splenium of the corpus callosum. No significant mass effect and no associated hemorrhage. 3. Small chronic-appearing T2 hyperintense lacune/lesion in the  left corona radiata with trace associated hemosiderin. 4. The above could reflect chronic small vessel disease, but consider the possibility of a central embolic source, such as patent foramen ovale. 5. MRA findings are below. MRA HEAD WITHOUT CONTRAST Technique: Angiographic images of the Circle of Willis were obtained using MRA technique without intravenous contrast. Findings: Antegrade flow in the posterior circulation codominant distal vertebral arteries. Normal PICA origins. Normal vertebrobasilar junction. Mild basilar artery irregularity without stenosis. SCA and PCA origins are within normal limits. Posterior communicating arteries are diminutive or absent. Bilateral PCA branches are within normal limits. Antegrade flow in both ICA siphons. Mild bilateral ICA irregularity. No ICA stenosis. Ophthalmic artery origins are within normal limits. Patent carotid termini. MCA and ACA origins are within normal limits. Codominant ACA A1 segment. Diminutive anterior communicating artery. Visualized ACA branches are within normal limits. Visualized bilateral MCA branches are within normal limits. IMPRESSION: Mild intracranial atherosclerosis. Otherwise negative intracranial MRA. Original Report Authenticated By: Erskine Speed, M.D.   Post Admission Physician Evaluation:  1. Functional deficits secondary to right basal ganglia hemorrhage. 2. Patient is admitted to receive collaborative, interdisciplinary care between the physiatrist, rehab nursing staff, and therapy team. 3. Patient's level of medical complexity and substantial therapy needs in context of that medical necessity cannot be provided at a lesser intensity of care such as a SNF. 4. Patient has experienced substantial functional loss from his/her baseline which was documented above under the "Functional History" and "Functional Status" headings. Judging by the patient's  diagnosis, physical exam, and functional history, the patient has potential for  functional progress which will result in measurable gains while on inpatient rehab. These gains will be of substantial and practical use upon discharge in facilitating mobility and self-care at the household level. 5. Physiatrist will provide 24 hour management of medical needs as well as oversight of the therapy plan/treatment and provide guidance as appropriate regarding the interaction of the two. 6. 24 hour rehab nursing will assist with bladder management, bowel management, safety, skin/wound care, disease management, medication administration, pain management and patient education and help integrate therapy concepts, techniques,education, etc. 7. PT will assess and treat for: Lower extremity strength, range of motion, stamina, balance, functional mobility, safety, adaptive techniques and equipment, NMR, visual-spatial needs. Goals are: supervision to minimal assist. 8. OT will assess and treat for: ADL's, functional mobility, safety, upper extremity strength, adaptive techniques and equipment, NMR, visual-spatial needs. Goals are: supervision to minimal assist. 9. SLP will assess and treat for: n/a. Goals are: n/a. (Defer for now-consult moving forward if indicated) 10. Case Management and Social Worker will assess and treat for psychological issues and discharge planning. 11. Team conference will be held weekly to assess progress toward goals and to determine barriers to discharge. 12. Patient will receive at least 3 hours of therapy per day at least 5 days per week. 13. ELOS: 2 to 2.5 weeks Prognosis: excellent   Medical Problem List and Plan:  1. Right basal ganglia hemorrhage.  2. DVT Prophylaxis/Anticoagulation: SCDs. Monitor for any signs of DVT. Dopplers negative  3. Pain Management/headaches: Neurontin 100 mg twice a day, Ultram as needed. Monitor with increased mobility  4. Neuropsych: This patient is capable of making decisions on his/her own behalf.  5. Mood/depression. Sinequan 25  mg each bedtime, Cymbalta 60 mg daily. Provide emotional support and positive reinforcement  6. Hypertension. Hydrochlorothiazide 25 mg. Monitor with increased activity. Educate pt regarding secondary prevention of ICH/stroke  05/20/2012 Ranelle Oyster, MD, [place] [facility]

## 2012-05-20 NOTE — Interval H&P Note (Signed)
History and Physical Interval Note:  05/20/2012 11:35 AM  Edward Weber  has presented today for surgery, with the diagnosis of stroke  The various methods of treatment have been discussed with the patient and family. After consideration of risks, benefits and other options for treatment, the patient has consented to  Procedure(s) (LRB) with comments: TRANSESOPHAGEAL ECHOCARDIOGRAM (TEE) (N/A) as a surgical intervention .  The patient's history has been reviewed, patient examined, no change in status, stable for surgery.  I have reviewed the patient's chart and labs.  Questions were answered to the patient's satisfaction.     [name]

## 2012-05-20 NOTE — Progress Notes (Signed)
TEE with PFO. Have placed order for LE dopplers. Ok for transfer to rehab following completion.  Annie Main, MSN, RN, ANVP-BC, ANP-BC, [facility] Pager: [phone] 05/20/2012 12:18 PM

## 2012-05-20 NOTE — Progress Notes (Signed)
Patient information reviewed and entered into eRehab system by Moet Mikulski, RN, CRRN, PPS Coordinator.  Information including medical coding and functional independence measure will be reviewed and updated through discharge.     Per nursing patient was given "Data Collection Information Summary for Patients in Inpatient Rehabilitation Facilities with attached "Privacy Act Statement-Health Care Records" upon admission.  

## 2012-05-20 NOTE — Plan of Care (Signed)
Overall Plan of Care [facility]) Patient Details Name: Edward Weber MRN: [id number] DOB: 15-Jan-1961  Diagnosis:  Rehab for Left hemiparesis, R lentiform nucleus ICH  Co-morbidities: Gout with acute flare affecting both ankles, dysarthria  Functional Problem List  Patient demonstrates impairments in the following areas: Balance, Bowel, Edema, Endurance, Medication Management, Motor, Pain, Sensory  and Vision  Basic ADL's: grooming, bathing, dressing, toileting and transfers, LUE function Advanced ADL's: simple meal preparation, laundry and light housekeeping  Transfers:  bed mobility, bed to chair, toilet, tub/shower, car and furniture Locomotion:  ambulation, wheelchair mobility and stairs  Additional Impairments:  Functional use of upper extremity  Anticipated Outcomes Item Anticipated Outcome  Eating/Swallowing    Basic self-care    MOD I  Tolieting    MOD I  Bowel/Bladder  Mod I with bowel and bladder  Transfers  Mod I  Locomotion  Mod I  Communication    Cognition    Pain  Less than stated goal of 3  Safety/Judgment    Other     Therapy Plan: PT Intensity: Minimum of 1-2 x/day ,45 to 90 minutes PT Frequency: 5 out of 7 days PT Duration Estimated Length of Stay: 2 weeks OT Intensity: Minimum of 1-2 x/day, 45 to 90 minutes OT Frequency: 5 out of 7 days OT Duration/Estimated Length of Stay: 2-3 weeks      Team Interventions: Item RN PT OT SLP SW TR Other  Self Care/Advanced ADL Retraining   X      xNeuromuscular Re-Education  x X      Therapeutic Activities  x X      UE/LE Strength Training/ROM  x X      UE/LE Coordination Activities  x X      Visual/Perceptual Remediation/Compensation   X      DME/Adaptive Equipment Instruction  x X      Therapeutic Exercise  x X      Balance/Vestibular Training  x X      Patient/Family Education x x X      Cognitive Remediation/Compensation         Functional Mobility Training  x X      Ambulation/Gait Training  x        Museum/gallery curator  x       Wheelchair Propulsion/Positioning  x X      Control and instrumentation engineer  x       Community Reintegration  x       Dysphagia/Aspiration Film/video editor         Bladder Management         Bowel Management x        Disease Management/Prevention         Pain Management x x X      Medication Management x        Skin Care/Wound Management x        Splinting/Orthotics  x X      Discharge Planning  x X      Psychosocial Support                                Team Discharge Planning: Destination: PT-Home ,OT- Home , SLP-  Projected Follow-up: PT-Outpatient PT, OT-  Home health OT, SLP-  Projected Equipment Needs: PT- , OT- 3N1 COMMODE CHAIR; Tub/shower bench;Rolling walker with 5" wheels, SLP-  Patient/family involved in  discharge planning: PT- Patient,  OT-Patient, GIRLFRIEND,    SLP-   MD ELOS: 10-14 days Medical Rehab Prognosis:  Good Assessment: 52 yo male admitted for ICH, basal ganglia, now requiring  PM&R MD, Rehab RN, CIR level PT,OT SLP,MSW.  Team focusing on ADLs, LUE NM re education, Mobility. Dysarthria, with goal of Supervision.    See Team Conference Notes for weekly updates to the plan of care

## 2012-05-21 ENCOUNTER — Inpatient Hospital Stay (HOSPITAL_COMMUNITY): Payer: Medicare Other | Admitting: *Deleted

## 2012-05-21 ENCOUNTER — Inpatient Hospital Stay (HOSPITAL_COMMUNITY): Payer: Medicare Other | Admitting: Physical Therapy

## 2012-05-21 ENCOUNTER — Inpatient Hospital Stay (HOSPITAL_COMMUNITY): Payer: Medicare Other

## 2012-05-21 DIAGNOSIS — M109 Gout, unspecified: Secondary | ICD-10-CM

## 2012-05-21 MED ORDER — HYDRALAZINE HCL 25 MG PO TABS
25.0000 mg | ORAL_TABLET | Freq: Four times a day (QID) | ORAL | Status: DC | PRN
  Administered 2012-05-21: 25 mg via ORAL
  Filled 2012-05-21: qty 1

## 2012-05-21 MED ORDER — ALLOPURINOL 300 MG PO TABS
300.0000 mg | ORAL_TABLET | Freq: Every day | ORAL | Status: DC
  Administered 2012-05-21 – 2012-05-23 (×3): 300 mg via ORAL
  Filled 2012-05-21 (×4): qty 1

## 2012-05-21 MED ORDER — HYDROCODONE-ACETAMINOPHEN 5-325 MG PO TABS
1.0000 | ORAL_TABLET | Freq: Four times a day (QID) | ORAL | Status: DC | PRN
  Administered 2012-05-21 – 2012-05-27 (×10): 1 via ORAL
  Filled 2012-05-21 (×11): qty 1

## 2012-05-21 NOTE — Evaluation (Signed)
Occupational Therapy Assessment and Plan  Patient Details  Name: Edward Weber MRN: [id number] Date of Birth: Sep 06, 1960  OT Diagnosis: abnormal posture, ataxia, hemiplegia affecting non-dominant side and muscle weakness (generalized) Rehab Potential: Rehab Potential: Good ELOS: 2-3 weeks   Today's Date: 05/21/2012 Time: 0900-1000  (60 min)  1st session Time Calculation (min): 60 min:  Pain:  10/10 pain in both feet   Time:  1500-1600  (60 min)  2nd session;  Pain:  3/10 pain in both feet  Problem List:  Patient Active Problem List  Diagnosis  . Hemiplegia, unspecified, affecting nondominant side  . Intracerebral hemorrhage  . Stroke, bilateral, acute, embolic  . Hypertension  . Headache  . Cytotoxic brain edema  . PFO (patent foramen ovale)    Past Medical History:  Past Medical History  Diagnosis Date  . Hypertension   . GERD (gastroesophageal reflux disease)   . Depression   . Hepatitis    Past Surgical History: No past surgical history on file.  Assessment & Plan Clinical Impression: HPI: Edward Weber is a 52 y.o. right-handed male with history of hypertension. Patient was independent prior to admission. Admitted 05/17/2012 a left-sided weakness and headache from [facility] emergency department. Initial blood pressure 163/99 received labetalol and hydralazine. MRI of the brain showed acute right lentiform nuclei hemorrhage 34 x 24 x 23 mm with surrounding edema. MRA of the head with atherosclerotic type changes. Echocardiogram with ejection fraction 45% grade 1 diastolic dysfunction . Carotid Dopplers with no significant ICA stenosis. Followup cranial CT scan 05/19/2012 stable appearance. Neurology services followup patient did not receive TPA secondary to hemorrhage. TEE completed 05/20/2012 positive PFO and venous Dopplers lower extremities completed that were negative .  Patient transferred to CIR on 05/20/2012 .    Patient currently requires mod with basic  self-care skills secondary to abnormal tone, unbalanced muscle activation, decreased coordination and decreased motor planning.  Prior to hospitalization, patient could complete BADL with independent .  Patient will benefit from skilled intervention to increase independence with basic self-care skills prior to discharge home with care partner.  Anticipate patient will require intermittent supervision and follow up home health.  OT - End of Session Activity Tolerance: Tolerates 10 - 20 min activity with multiple rests Endurance Deficit: Yes Endurance Deficit Description: limited by pain OT Assessment Rehab Potential: Good Barriers to Discharge: None OT Plan OT Intensity: Minimum of 1-2 x/day, 45 to 90 minutes OT Frequency: 5 out of 7 days OT Duration/Estimated Length of Stay: 2-3 weeks OT Treatment/Interventions: Balance/vestibular training;Community reintegration;Discharge planning;DME/adaptive equipment instruction;Functional mobility training;Neuromuscular re-education;Pain management;Patient/family education;Psychosocial support;Self Care/advanced ADL retraining;Therapeutic Activities;Therapeutic Exercise;UE/LE Strength taining/ROM;UE/LE Coordination activities;Wheelchair propulsion/positioning   Skilled Therapeutic Intervention:  2nd session:  Addressed toilet transfer, attempted tub shower transfer but pt too drowsy.  Pt. Was mod assist with toilet transfer with 3n1 over toilet and grab bars.  Propelled wc to ADL apartment area.  Girlffiend present.  Attempted transfer to tub but pt so drowsy did not feel safe with transfer.  Educated GF and pt on the tub bench and HH shower hose.  Propelled back to room with moderate assist.  Pt needed max cues not to hit objects on left.  Questioned Hemiopsia or inattention , but needs further testing.  Pt maybe pushing harder with right arm causing wc to veer left.       OT Evaluation Precautions/Restrictions  Precautions Precautions:  Fall Restrictions Weight Bearing Restrictions: No      Pain Pain Assessment  Pain Assessment: 0-10 Pain Score: 10-Worst pain ever Pain Type: Acute pain Pain Location: Foot Pain Orientation: Right;Left Pain Descriptors: Aching Pain Onset: With Activity Pain Intervention(s): RN made aware;Repositioned;Rest Home Living/Prior Functioning Home Living Lives With: Alone Available Help at Discharge:  (girlfriend) Type of Home: Apartment Home Access: Stairs to enter [employer] of Steps: 3 Entrance Stairs-Rails: Left Home Layout: One level Bathroom Shower/Tub: Forensic scientist: Standard Bathroom Accessibility: Yes How Accessible: Accessible via walker;Accessible via wheelchair Home Adaptive Equipment: None IADL History Homemaking Responsibilities: Yes Meal Prep Responsibility: Primary Laundry Responsibility: Primary Cleaning Responsibility: Primary Bill Paying/Finance Responsibility: Primary Shopping Responsibility: Primary Current License: Yes Mode of Transportation: Car Occupation: On disability Leisure and Hobbies:  (cook, cards, dominoes, Teacher, English as a foreign language, dancing, checkers) Prior Function Level of Independence: Independent with homemaking with ambulation Able to Take Stairs?: Yes Driving: Yes Vocation: On disability Leisure: Hobbies-yes (Comment) Comments: cooks, cards, horseshoe ADL   Vision/Perception  Vision - History Baseline Vision: Wears glasses all the time Patient Visual Report: No change from baseline Vision - Assessment Eye Alignment: Within Functional Limits Vision Assessment: Vision not tested Perception Perception: Within Functional Limits  Cognition Overall Cognitive Status: Appears within functional limits for tasks assessed Orientation Level: Oriented X4 Behaviors: Impulsive;Other (comment) (moves quickly) Safety/Judgment: Appears intact Comments: max cues to get pt to slow down during  trfers Sensation Sensation Light Touch: Appears Intact Light Touch Impaired Details: Impaired LLE;Impaired LUE Hot/Cold: Appears Intact Proprioception: Impaired Detail Proprioception Impaired Details: Impaired LLE;Impaired LUE Coordination Gross Motor Movements are Fluid and Coordinated: No Fine Motor Movements are Fluid and Coordinated: No Coordination and Movement Description: impaired L UE coordination, difficult to test LEs due to pain Motor  Motor Motor - Skilled Clinical Observations: generalized weakness Mobility  Bed Mobility Supine to Sit: 5: Supervision Supine to Sit Details (indicate cue type and reason): increased time Transfers Sit to Stand: 2: Max assist Sit to Stand Details (indicate cue type and reason): cuing for UE/LE placement, max A for anterior wt shift and lifting assist to stand  Trunk/Postural Assessment  Cervical Assessment Cervical Assessment: Within Functional Limits Thoracic Assessment Thoracic Assessment: Within Functional Limits Lumbar Assessment Lumbar Assessment:  (posterior tilt) Postural Control Postural Control: Deficits on evaluation Postural Limitations: decreased trunk strength for scooting to edge of bed and turning  Balance Static Sitting Balance Static Sitting - Balance Support: Right upper extremity supported;Feet supported Static Sitting - Level of Assistance: 5: Stand by assistance Dynamic Sitting Balance Dynamic Sitting - Level of Assistance: 4: Min Oncologist Standing - Balance Support: Bilateral upper extremity supported Static Standing - Level of Assistance: 2: Max assist Dynamic Standing Balance Dynamic Standing - Balance Support: During functional activity Dynamic Standing - Level of Assistance: 2: Max assist Extremity/Trunk Assessment RUE Assessment RUE Assessment: Within Functional Limits LUE Assessment LUE Assessment: Exceptions to WFL LUE AROM (degrees) Left Shoulder Flexion: 70  Degrees    Refer to Care Plan for Long Term Goals  Recommendations for other services: None  Discharge Criteria: Patient will be discharged from OT if patient refuses treatment 3 consecutive times without medical reason, if treatment goals not met, if there is a change in medical status, if patient makes no progress towards goals or if patient is discharged from hospital.  The above assessment, treatment plan, treatment alternatives and goals were discussed and mutually agreed upon: by patient and by family  Humberto Seals 05/21/2012, 3:02 PM

## 2012-05-21 NOTE — Evaluation (Signed)
Physical Therapy Assessment and Plan  Patient Details  Name: Edward Weber MRN: [id number] Date of Birth: [birth year]  PT Diagnosis: Difficulty walking, Impaired sensation, Muscle weakness and Pain in feet Rehab Potential: Good ELOS: 2 weeks   Today's Date: 05/21/2012 Time: 1100-1145 Time Calculation (min): 45 min  Problem List:  Patient Active Problem List  Diagnosis  . Hemiplegia, unspecified, affecting nondominant side  . Intracerebral hemorrhage  . Stroke, bilateral, acute, embolic  . Hypertension  . Headache  . Cytotoxic brain edema  . PFO (patent foramen ovale)    Past Medical History:  Past Medical History  Diagnosis Date  . Hypertension   . GERD (gastroesophageal reflux disease)   . Depression   . Hepatitis    Past Surgical History: No past surgical history on file.  Assessment & Plan Clinical Impression: Patient is a 52 y.o. year old male with recent admission to the hospital on 05/17/2012 a left-sided weakness and headache from [facility] emergency department. Initial blood pressure 163/99 received labetalol and hydralazine. MRI of the brain showed acute right lentiform nuclei hemorrhage 34 x 24 x 23 mm with surrounding edema. MRA of the head with atherosclerotic type changes. Echocardiogram with ejection fraction 45% grade 1 diastolic dysfunction . Carotid Dopplers with no significant ICA stenosis. Followup cranial CT scan 05/19/2012 stable appearance. Neurology services followup patient did not receive TPA secondary to hemorrhage. TEE completed 05/20/2012 positive PFO and venous Dopplers lower extremities completed that were negative. Patient transferred to CIR on 05/20/2012 .   Patient currently requires max with mobility secondary to muscle weakness and decreased standing balance and decreased balance strategies.  Prior to hospitalization, patient was modified independent  with mobility and lived with Alone in a House home.  Home access is 3Stairs to  enter.  Patient will benefit from skilled PT intervention to maximize safe functional mobility, minimize fall risk and decrease caregiver burden for planned discharge home with intermittent assist.  Anticipate patient will benefit from follow up OP at discharge.  PT - End of Session Activity Tolerance: Tolerates 30+ min activity with multiple rests Endurance Deficit: Yes Endurance Deficit Description: limited by pain PT Assessment Rehab Potential: Good Barriers to Discharge: Decreased caregiver support PT Plan PT Intensity: Minimum of 1-2 x/day ,45 to 90 minutes PT Frequency: 5 out of 7 days PT Duration Estimated Length of Stay: 2 weeks PT Treatment/Interventions: Ambulation/gait training;Discharge planning;Functional mobility training;Therapeutic Activities;Balance/vestibular training;Neuromuscular re-education;Therapeutic Exercise;Wheelchair propulsion/positioning;UE/LE Strength taining/ROM;Splinting/orthotics;Pain management;DME/adaptive equipment instruction;Community reintegration;Functional electrical stimulation;Patient/family education;Stair training;UE/LE Coordination activities PT Recommendation Recommendations for Other Services: Speech consult (pt with c/o dysarthria) Follow Up Recommendations: Outpatient PT Patient destination: Home  Skilled Therapeutic Intervention Attempt sit to stand from elevated surfaces with RW multiple attempts requiring max A due to pt with extreme pain wt bearing in B feet.  Pt requires cues for UE/LE placement each attempt.  Scoot pivot transfer training with Max A due to pain.  Supine therex for core and B LE strengthening.  Pt with difficulty tolerating placing heels on mat to perform exercises, only able to perform 2 exercises x 10 before pain was too great.  Seated abdominal strengthening exercises with feet elevated off of floor with improved pt response.  W/c mobility with min hand over hand assist for L UE due to weakness.  PT  Evaluation Precautions/Restrictions Precautions Precautions: Fall Restrictions Weight Bearing Restrictions: No General Amount of Missed PT Time (min): 15 Minutes Missed Time Reason: Pain (10/10 pain B feet, RN made aware)  Pain Pain  Assessment Pain Assessment: 0-10 Pain Score: 10-Worst pain ever Pain Type: Acute pain Pain Location: Foot Pain Orientation: Right;Left Pain Descriptors: Aching Pain Onset: With Activity Pain Intervention(s): RN made aware;Repositioned;Rest Home Living/Prior Functioning Home Living Lives With: Alone Available Help at Discharge: Family Type of Home: House Home Access: Stairs to enter Secretary/administrator of Steps: 3 Entrance Stairs-Rails: None Home Layout: One level Home Adaptive Equipment: Straight cane Prior Function Level of Independence: Requires assistive device for independence;Independent with transfers;Independent with basic ADLs Able to Take Stairs?: Yes Driving: Yes   Cognition Overall Cognitive Status: Appears within functional limits for tasks assessed Safety/Judgment: Appears intact Sensation Sensation Light Touch: Impaired Detail Light Touch Impaired Details: Impaired LLE;Impaired LUE Proprioception: Impaired Detail Proprioception Impaired Details: Impaired LLE;Impaired LUE Coordination Gross Motor Movements are Fluid and Coordinated: No Fine Motor Movements are Fluid and Coordinated: No Coordination and Movement Description: impaired L UE coordination, difficult to test LEs due to pain Motor  Motor Motor - Skilled Clinical Observations: generalized weakness  Mobility Bed Mobility Supine to Sit: 5: Supervision Supine to Sit Details (indicate cue type and reason): increased time Transfers Sit to Stand: 2: Max assist Sit to Stand Details (indicate cue type and reason): cuing for UE/LE placement, max A for anterior wt shift and lifting assist to stand Lateral/Scoot Transfers: 2: Max assist Lateral/Scoot Transfer Details  (indicate cue type and reason): assist for UE/LE placement, manual facilitation for forward wt shift, lifting assist, manual facilitation for wt shifting laterally Locomotion  Ambulation Ambulation: Yes Ambulation/Gait Assistance: 1: +2 Total assist Ambulation Distance (Feet): 3 Feet Assistive device: Rolling walker Ambulation/Gait Assistance Details: +2 assist for safety, pt able to independently adavance LEs, unable to tolerate further gait due to pain Wheelchair Mobility Wheelchair Mobility: Yes Wheelchair Assistance: 4: Scientist, research (physical sciences) Assistance Details:  (min A due to L UE weakness, requires assist to turn) Occupational hygienist: Both upper extremities Wheelchair Parts Management: Needs assistance Distance: 100'  Trunk/Postural Assessment  Cervical Assessment Cervical Assessment: Within Functional Limits Thoracic Assessment Thoracic Assessment: Within Functional Limits Lumbar Assessment Lumbar Assessment:  (posterior tilt) Postural Control Postural Control: Within Functional Limits  Balance Static Sitting Balance Static Sitting - Level of Assistance: 5: Stand by assistance Dynamic Sitting Balance Dynamic Sitting - Level of Assistance: 5: Stand by assistance Static Standing Balance Static Standing - Balance Support: Bilateral upper extremity supported Static Standing - Level of Assistance: 2: Max assist Dynamic Standing Balance Dynamic Standing - Balance Support: During functional activity Dynamic Standing - Level of Assistance: 2: Max assist Extremity Assessment      RLE Assessment RLE Assessment:  (WFL except R ankle ROM limited by swelling, pain) LLE Assessment LLE Assessment:  (grossly 3-/5)  FIM:  FIM - Bed/Chair Transfer Bed/Chair Transfer: 2: Chair or W/C > Bed: Max A (lift and lower assist);2: Bed > Chair or W/C: Max A (lift and lower assist) FIM - Locomotion: Wheelchair Distance: 100' Locomotion: Wheelchair: 2: Travels 50 - 149 ft with minimal  assistance (Pt.>75%) FIM - Locomotion: Ambulation Ambulation/Gait Assistance: 1: +2 Total assist Locomotion: Ambulation: 1: Two helpers FIM - Locomotion: Stairs Locomotion: Stairs: 0: Activity did not occur (unable to assess on eval due to 10/10 pain)   Refer to Care Plan for Long Term Goals  Recommendations for other services: None  Discharge Criteria: Patient will be discharged from PT if patient refuses treatment 3 consecutive times without medical reason, if treatment goals not met, if there is a change in medical status, if patient makes no  progress towards goals or if patient is discharged from hospital.  The above assessment, treatment plan, treatment alternatives and goals were discussed and mutually agreed upon: by patient  [facility] 05/21/2012, 2:50 PM

## 2012-05-21 NOTE — Progress Notes (Addendum)
Edward Weber is a 52 y.o. male Apr 06, 1961 [id number]  Subjective: complains of gout pain in both ankles - L foot and ankle since last 2 days, new symptoms starting in R foot this AM. Hx same, but not on prophylaxis PTA  Objective: Vital signs in last 24 hours: Temp:  [98.2 F (36.8 C)-98.7 F (37.1 C)] 98.3 F (36.8 C) (02/08 0511) Pulse Rate:  [89-103] 90 (02/08 0852) Resp:  [12-33] 18 (02/08 0511) BP: (99-177)/(50-116) 177/106 mmHg (02/08 0852) SpO2:  [93 %-99 %] 96 % (02/08 0511) Weight:  [83.915 kg (185 lb)] 83.915 kg (185 lb) (02/07 1425) Weight change:  Last BM Date: 05/17/12  Intake/Output from previous day: 02/07 0701 - 02/08 0700 In: 480 [P.O.:480] Out: 650 [Urine:650] Last cbgs: CBG (last 3)  No results found for this basename: GLUCAP,  in the last 72 hours   Physical Exam General: No apparent distress    Lungs: Normal effort. Lungs clear to auscultation, no crackles or wheezes. Cardiovascular: Regular rate and rhythm, no edema Musculoskeletal:  Acute gout flare L ankle and 1st MTP with warmth, mild erythema and edema Neurological: No new neurological deficits  Lab Results: BMET No results found for this basename: na, k, cl, co2, glucose, bun, creatinine, calcium, gfrnonaa, gfraa   CBC No results found for this basename: wbc, rbc, hgb, hct, plt, mcv, mch, mchc, rdw, neutrabs, lymphsabs, monoabs, eosabs, basosabs    Studies/Results: No results found.  Medications: I have reviewed the patient's current medications.  Assessment/Plan: Functional deficits secondary to right basal ganglia hemorrhage. 1. Right basal ganglia hemorrhage.  2. DVT Prophylaxis/Anticoagulation: SCDs. Monitor for any signs of DVT. Dopplers negative  3. Pain Management/headaches: Neurontin 100 mg twice a day, Ultram as needed. Monitor with increased mobility  4. Neuropsych: This patient is capable of making decisions on his/her own behalf.  5. Mood/depression. Sinequan 25 mg each  bedtime, Cymbalta 60 mg daily. Provide emotional support and positive reinforcement  6. Hypertension. Hydrochlorothiazide 25 mg. Add prn hydralazine. Monitor with increased activity. Educate pt regarding secondary prevention of ICH/stroke  7. Gout flare: L>R ankle pain with redness and swelling - reports history of same - on colchine BID - add allopurinol qd - also add prn norco to prn ultram   Length of stay, days: 1  IRETON,SUSAN C , PA-C 05/21/2012, 9:10 AM  I have examined the patient and agree with above.  Valerie A. Felicity Coyer, MD 10:13 AM

## 2012-05-21 NOTE — Plan of Care (Signed)
Problem: RH SKIN INTEGRITY Goal: RH STG MAINTAIN SKIN INTEGRITY WITH ASSISTANCE STG Maintain Skin Integrity With out Assistance. PT to apply creams and inspect skin  Outcome: Progressing Dry rashes all over.

## 2012-05-22 ENCOUNTER — Inpatient Hospital Stay (HOSPITAL_COMMUNITY): Payer: Medicare Other | Admitting: Physical Therapy

## 2012-05-22 NOTE — Progress Notes (Signed)
Patient is not eating food sent on from cafeteria.  Meal tray found with 100% consumption; wife states other family member consumes food on trays.  Wife states pt. Will not eat dairy products, foods with salt or butter.  Family brings food from home for patient to consume.    Pt. States he has not had a bowel movement since Tuesday and states his appetite has been poor.  Senna-Kot given BID (see MAR).

## 2012-05-22 NOTE — Progress Notes (Signed)
Edward Weber is a 52 y.o. male 08-12-1960 [id number]  Subjective: complains of continued gout pain in both ankles - L > R foot. Hx same, but not on prophylaxis PTA  Objective: Vital signs in last 24 hours: Temp:  [98.1 F (36.7 C)-98.3 F (36.8 C)] 98.1 F (36.7 C) (02/09 0630) Pulse Rate:  [79-107] 79 (02/09 0630) Resp:  [20] 20 (02/09 0630) BP: (117-129)/(71-86) 117/71 mmHg (02/09 0630) SpO2:  [95 %] 95 % (02/09 0630) Weight change:  Last BM Date: 05/17/12  Intake/Output from previous day: 02/08 0701 - 02/09 0700 In: 780 [P.O.:780] Out: 700 [Urine:700] Last cbgs: CBG (last 3)  No results found for this basename: GLUCAP,  in the last 72 hours   Physical Exam General: No apparent distress    Lungs: Normal effort. Lungs clear to auscultation, no crackles or wheezes. Cardiovascular: Regular rate and rhythm, no edema Musculoskeletal:  Acute gout flare L ankle and 1st MTP with warmth, mild erythema and edema Neurological: No new neurological deficits  Lab Results: BMET No results found for this basename: na,  k,  cl,  co2,  glucose,  bun,  creatinine,  calcium,  gfrnonaa,  gfraa   CBC No results found for this basename: wbc,  rbc,  hgb,  hct,  plt,  mcv,  mch,  mchc,  rdw,  neutrabs,  lymphsabs,  monoabs,  eosabs,  basosabs    Studies/Results: No results found.  Medications: I have reviewed the patient's current medications.  Assessment/Plan: Functional deficits secondary to right basal ganglia hemorrhage. 1. Right basal ganglia hemorrhage.  2. DVT Prophylaxis/Anticoagulation: SCDs. Monitor for any signs of DVT. Dopplers negative  3. Pain Management/headaches: Neurontin 100 mg twice a day, Ultram as needed. Monitor with increased mobility  4. Neuropsych: This patient is capable of making decisions on his/her own behalf.  5. Mood/depression. Sinequan 25 mg each bedtime, Cymbalta 60 mg daily. Provide emotional support and positive reinforcement  6. Hypertension.  Hydrochlorothiazide 25 mg. Add prn hydralazine. Monitor with increased activity. Educate pt regarding secondary prevention of ICH/stroke  7. Gout flare: L>R ankle pain with redness and swelling - reports history of same - on colchine BID - added allopurinol 2/8 and prn norco to ongoing prn ultram -?consider steroids   Length of stay, days: 2  Rene Paci , MD  05/22/2012, 9:02 AM

## 2012-05-22 NOTE — Progress Notes (Signed)
Physical Therapy Note  Patient Details  Name: Edward Weber MRN: [id number] Date of Birth: 07/03/1960 Today's Date: 05/22/2012  1115-1200 (45 minutes) individual Pain: 8/10 bilateral feet (LT > RT) /premedicated Focus of treatment: Transfer training, standing tolerance, wc mobility, therapeutic exercise focused on activity tolerance Treatment: Pt in shower upon arrival; pt completed shower on shower chair with hand held shower; pt completed shower and dried with assist for setup only; pt donned shorts in sitting and stood to safety rail to pull up; pt transferred to wc (squat/pivot) with assist for safety (attempts to sit before unsafely before close to wc); pt donned pants, shirt in sitting and stood with min assist to RW to pull up pants ; standing tolerance X 4 minutes to RW with decreased eccentric control stand to sit.; wc mobility - 120 feet SBA with vcs for steering ; Nustep Level 4 X 10 minutes for activity tolerance (no complaint of pain during this activity) .  Hae Ahlers,JIM 05/22/2012, 7:32 AM

## 2012-05-23 ENCOUNTER — Encounter (HOSPITAL_COMMUNITY): Payer: Self-pay | Admitting: Internal Medicine

## 2012-05-23 ENCOUNTER — Inpatient Hospital Stay (HOSPITAL_COMMUNITY): Payer: Medicare Other | Admitting: Occupational Therapy

## 2012-05-23 ENCOUNTER — Inpatient Hospital Stay (HOSPITAL_COMMUNITY): Payer: Medicare Other | Admitting: Physical Therapy

## 2012-05-23 ENCOUNTER — Inpatient Hospital Stay (HOSPITAL_COMMUNITY): Payer: Medicare Other

## 2012-05-23 DIAGNOSIS — I619 Nontraumatic intracerebral hemorrhage, unspecified: Secondary | ICD-10-CM

## 2012-05-23 DIAGNOSIS — G8110 Spastic hemiplegia affecting unspecified side: Secondary | ICD-10-CM

## 2012-05-23 DIAGNOSIS — M109 Gout, unspecified: Secondary | ICD-10-CM

## 2012-05-23 LAB — COMPREHENSIVE METABOLIC PANEL
BUN: 14 mg/dL (ref 6–23)
Calcium: 9.1 mg/dL (ref 8.4–10.5)
GFR calc Af Amer: >90 mL/min (ref >90)
Glucose, Bld: 103 mg/dL — ABNORMAL HIGH (ref 70–99)
Sodium: 131 mEq/L — ABNORMAL LOW (ref 135–145)
Total Protein: 7.5 g/dL (ref 6.0–8.3)

## 2012-05-23 LAB — CBC WITH DIFFERENTIAL/PLATELET
Eosinophils Absolute: 0.2 10*3/uL (ref 0.0–0.7)
Eosinophils Relative: 2 % (ref 0–5)
Lymphs Abs: 2.4 10*3/uL (ref 0.7–4.0)
MCH: 34.5 pg — ABNORMAL HIGH (ref 26.0–34.0)
MCHC: 35.5 g/dL (ref 30.0–36.0)
MCV: 97.1 fL (ref 78.0–100.0)
Platelets: 133 10*3/uL — ABNORMAL LOW (ref 150–400)
RBC: 3.8 MIL/uL — ABNORMAL LOW (ref 4.22–5.81)

## 2012-05-23 MED ORDER — METHYLPREDNISOLONE 4 MG PO KIT
4.0000 mg | PACK | ORAL | Status: AC
  Administered 2012-05-23: 4 mg via ORAL

## 2012-05-23 MED ORDER — METHYLPREDNISOLONE 4 MG PO KIT
8.0000 mg | PACK | Freq: Every evening | ORAL | Status: AC
  Administered 2012-05-23: 8 mg via ORAL

## 2012-05-23 MED ORDER — METHYLPREDNISOLONE 4 MG PO KIT
8.0000 mg | PACK | Freq: Every evening | ORAL | Status: AC
  Administered 2012-05-24: 8 mg via ORAL

## 2012-05-23 MED ORDER — METHYLPREDNISOLONE 4 MG PO KIT
4.0000 mg | PACK | Freq: Three times a day (TID) | ORAL | Status: AC
  Administered 2012-05-24 (×3): 4 mg via ORAL

## 2012-05-23 MED ORDER — METHYLPREDNISOLONE 4 MG PO KIT
4.0000 mg | PACK | Freq: Four times a day (QID) | ORAL | Status: DC
  Administered 2012-05-25 – 2012-05-27 (×8): 4 mg via ORAL

## 2012-05-23 MED ORDER — METHYLPREDNISOLONE 4 MG PO KIT
8.0000 mg | PACK | Freq: Every morning | ORAL | Status: AC
  Administered 2012-05-23: 8 mg via ORAL
  Filled 2012-05-23: qty 21

## 2012-05-23 NOTE — Care Management Note (Signed)
[facility] Individual Statement of Services  Patient Name:  Edward Weber  Date:  05/23/2012  Welcome to the [facility].  Our goal is to provide you with an individualized program based on your diagnosis and situation, designed to meet your specific needs.  With this comprehensive rehabilitation program, you will be expected to participate in at least 3 hours of rehabilitation therapies Monday-Friday, with modified therapy programming on the weekends.  Your rehabilitation program will include the following services:  Physical Therapy (PT), Occupational Therapy (OT), Speech Therapy (ST), 24 hour per day rehabilitation nursing, Therapeutic Recreaction (TR), Case Management (  Social Worker), Rehabilitation Medicine, Nutrition Services and Pharmacy Services  Weekly team conferences will be held on Wednesday to discuss your progress.  Your  Social Worker will talk with you frequently to get your input and to update you on team discussions.  Team conferences with you and your family in attendance may also be held.  Expected length of stay: 2-2.5.weeks  Overall anticipated outcome: supervision/mod/i level  Depending on your progress and recovery, your program may change.  Your RN Case Estate agent will coordinate services and will keep you informed of any changes.  Your  SW name and contact number are listed  below.  The following services may also be recommended but are not provided by the [facility]:   Driving Evaluations  Home Health Rehabiltiation Services  Outpatient Rehabilitatation Servives   Arrangements will be made to provide these services after discharge if needed.  Arrangements include referral to agencies that provide these services.  Your insurance has been verified to be:  Medicare & Medicaid Your primary doctor is:  Dr Duncan Dull  Pertinent information will be shared with your doctor and your  insurance company.   Social Worker:  Dossie Der, [place] [phone]  Information discussed with and copy given to patient by: Lucy Chris, 05/23/2012, 9:03 AM

## 2012-05-23 NOTE — Progress Notes (Signed)
Occupational Therapy Session Note  Patient Details  Name: Edward Weber MRN: [id number] Date of Birth: 1960/09/24  Today's Date: 05/23/2012 Time: 1030-1130 Time Calculation (min): 60 min  Short Term Goals: Week 1:  OT Short Term Goal 1 (Week 1): Pt. will transfer to toilet with minimal assist OT Short Term Goal 2 (Week 1): Pt. will maintain sitting balance duing dressing with close supervision OT Short Term Goal 3 (Week 1): Pt. will groom self with set up assist OT Short Term Goal 4 (Week 1): Pt. will bathe self in sitting/ standing with minimal assist OT Short Term Goal 5 (Week 1): Pt. will transfer to toilet with minimal assist  Skilled Therapeutic Interventions/Progress Updates:    Pt seen for  ADL retraining for B/D at shower level. Pt "ambulated" into shower with RW with steady assist by hopping on one foot as he had increased pain in other foot from gout.  Pt was able to shower on tub bench with supervision, he did not stand just used side to side leaning.  To avoid too much pressure on feet, pt transferred out of shower into w/c with supervision.  He was able to don all of his clothing with steady assist, assist pulling TED hose on, and tied his shoes.  Pt then propelled w/c to gym to use UBE, completed UE weighted dowel exercises, and grip strengthening with putty. Pt provided with theraputty.  Therapy Documentation Precautions:  Precautions Precautions: Fall Restrictions Weight Bearing Restrictions: No   Pain: Pain Assessment Pain Score:   8 Pain Type: Other (Comment) (worse with wt bearing) Pain Location: Ankle Pain Orientation: Left ADL:  See FIM for current functional status  Therapy/Group: Individual Therapy  Ivis Nicolson 05/23/2012, 12:09 PM

## 2012-05-23 NOTE — Progress Notes (Addendum)
Patient ID: Edward Weber, male   DOB: 1960-07-25, 52 y.o.   MRN: [id number]  Subjective/Complaints: 52 y.o. right-handed male with history of hypertension. Patient was independent prior to admission. Admitted 05/17/2012 a left-sided weakness and headache from [facility] emergency department. Initial blood pressure 163/99 received labetalol and hydralazine. MRI of the brain showed acute right lentiform nuclei hemorrhage 34 x 24 x 23 mm with surrounding edema. MRA of the head with atherosclerotic type changes. Echocardiogram with ejection fraction 45% grade 1 diastolic dysfunction . Carotid Dopplers with no significant ICA stenosis. Followup cranial CT scan 05/19/2012 stable appearance. Neurology services followup patient did not receive TPA secondary to hemorrhage. TEE completed 05/20/2012 positive PFO and venous Dopplers lower extremities completed that were negative. Slept poorly, "gout flared up".  DIagnosed with gout 8 mo ago.  Review of Systems  Musculoskeletal: Positive for joint pain.  Psychiatric/Behavioral: The patient has insomnia.        Insomnia due to foot/ankle pain  All other systems reviewed and are negative.    Objective: Vital Signs: Blood pressure 120/73, pulse 73, temperature 98 F (36.7 C), temperature source Oral, resp. rate 20, height 5' 8.5" (1.74 m), weight 83.915 kg (185 lb), SpO2 93.00%. No results found. No results found for this or any previous visit (from the past 72 hour(s)).   HEENT: normal Cardio: RRR Resp: CTA B/L GI: BS positive and NT, no masses Extremity:  Edema Left ankle edema and erythema Skin:   Other vitiligo Neuro: Alert/Oriented, Flat, Cranial Nerve II-XII normal, Normal Sensory, Abnormal Motor 2-/5 in Left delt, bi, tri, grip, 4/5 L hip flex, knee ext, 2-/5 Bilateral ankle DF/PF limited by pain.  R side is 5/5 except ankle, Abnormal FMC Ataxic/ dec FMC and Dysarthric Musc/Skel:  Swelling Bilateral ankle joint swelling , Left toes 2-5 ,  pain with ROM and tenderness and Other Gen : NAD Mood/affect appropriate but flat   Assessment/Plan: 1. Functional deficits secondary to R lentiform nucleus ICH with Left HP which require 3+ hours per day of interdisciplinary therapy in a comprehensive inpatient rehab setting. Physiatrist is providing close team supervision and 24 hour management of active medical problems listed below. Physiatrist and rehab team continue to assess barriers to discharge/monitor patient progress toward functional and medical goals. FIM: FIM - Bathing Bathing Steps Patient Completed: Chest;Right Arm;Left Arm;Abdomen;Front perineal area;Buttocks;Right upper leg;Left upper leg Bathing: 4: Min-Patient completes 8-9 24f 10 parts or 75+ percent  FIM - Upper Body Dressing/Undressing Upper body dressing/undressing: 0: Wears gown/pajamas-no public clothing FIM - Lower Body Dressing/Undressing Lower body dressing/undressing: 0: Wears Oceanographer  FIM - Hotel manager Devices: Grab bar or rail for support Toileting: 1: Total-Patient completed zero steps, helper did all 3  FIM - Diplomatic Services operational officer Devices: Elevated toilet seat Toilet Transfers: 3-To toilet/BSC: Mod A (lift or lower assist);3-From toilet/BSC: Mod A (lift or lower assist)  FIM - Bed/Chair Transfer Bed/Chair Transfer Assistive Devices: Bed rails Bed/Chair Transfer: 2: Supine > Sit: Max A (lifting assist/Pt. 25-49%);2: Bed > Chair or W/C: Max A (lift and lower assist);2: Chair or W/C > Bed: Max A (lift and lower assist)  FIM - Locomotion: Wheelchair Distance: 100' Locomotion: Wheelchair: 2: Travels 50 - 149 ft with minimal assistance (Pt.>75%) FIM - Locomotion: Ambulation Ambulation/Gait Assistance: 1: +2 Total assist Locomotion: Ambulation: 1: Two helpers  Comprehension Comprehension Mode: Auditory Comprehension: 7-Follows complex conversation/direction: With no  assist  Expression Expression Mode: Verbal Expression: 7-Expresses complex ideas: With no  assist  Social Interaction Social Interaction: 7-Interacts appropriately with others - No medications needed.  Problem Solving Problem Solving: 6-Solves complex problems: With extra time  Memory Memory: 7-Complete Independence: No helper Medical Problem List and Plan:  1. Right basal ganglia hemorrhage.  2. DVT Prophylaxis/Anticoagulation: SCDs. Monitor for any signs of DVT. Dopplers negative  3. Pain Management/headaches: Neurontin 100 mg twice a day, Ultram as needed. Monitor with increased mobility  4. Neuropsych: This patient is capable of making decisions on his/her own behalf.  5. Mood/depression. Sinequan 25 mg each bedtime, Cymbalta 60 mg daily. Provide emotional support and positive reinforcement  6. Hypertension. Hydrochlorothiazide 25 mg. Monitor with increased activity. Educate pt regarding secondary prevention of ICH/stroke 7.  Gout with acute flare, hold allopurinol, cont colchicine, add medrol burst and taper. Restart allopurinol when flare resolved  LOS (Days) 3 A FACE TO FACE EVALUATION WAS PERFORMED  Darrow Barreiro E 05/23/2012, 8:41 AM

## 2012-05-23 NOTE — Plan of Care (Signed)
Problem: RH PAIN MANAGEMENT Goal: RH STG PAIN MANAGED AT OR BELOW PT'S PAIN GOAL Less than 3  Outcome: Not Progressing D/C  Allopurinol change to medrol dose pack

## 2012-05-23 NOTE — Progress Notes (Signed)
Social Work Assessment and Plan Social Work Assessment and Plan  Patient Details  Name: Edward Weber MRN: [id number] Date of Birth: February 25, 1961  Today's Date: 05/23/2012  Problem List:  Patient Active Problem List  Diagnosis  . Hemiplegia, unspecified, affecting nondominant side  . Intracerebral hemorrhage  . Stroke, bilateral, acute, embolic  . Hypertension  . Headache  . Cytotoxic brain edema  . PFO (patent foramen ovale)   Past Medical History:  Past Medical History  Diagnosis Date  . Hypertension   . GERD (gastroesophageal reflux disease)   . Depression   . Hepatitis    Past Surgical History: No past surgical history on file. Social History:  reports that he has quit smoking. He does not have any smokeless tobacco history on file. He reports that he drinks about 5.4 ounces of alcohol per week. He reports that he uses illicit drugs (Marijuana).  Family / Support Systems Marital Status: Single Patient Roles: Partner;Parent Spouse/Significant Other: Edward Weber  [phone] Children: daughter local Anticipated Caregiver: Edward Ability/Limitations of Caregiver: Edward Weber is she can provide 24 hr care Caregiver Availability: Other (Comment) (try to confrim number of hours of care) Family Dynamics: Close with his daughter, but she is busy with school and work.  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  He has always taken care of himself and wants to be able to do so again.  He has been challenged before and has overcome obstacles he hopes to do so again. Recent Psychosocial Issues: Other medical issues Pyschiatric History: History of depression-depression screen score was 3.  He feels optimisitic and feels he has improved enough he will get back to indpendent level.  He does take meds for which he feels helps.  Will monitor his coping and have Neuro-psych see. Substance Abuse History: Quit tobacco, socially drinks and does smoke marijuana.  Is aware of the helaht risks involved with this.  He is unsure if he plans to quit  Patient / Family Perceptions, Expectations & Goals Pt/Family understanding of illness & functional limitations: Pt has a good understanding of his condition and deficits.  He has made progress thus far and is pleased with what he has made.  He is looking forward to therapy day today, since weekend you don't get much.  He is having pain with his feet from gout and MD is treating this. Premorbid pt/family roles/activities: Father, Retiree, Boyfriend, etc Anticipated changes in roles/activities/participation: resume Pt/family expectations/goals: Pt states: " I need to be independent, I really don't have someone all of thie time to help me."  " My friend will help some."  [employer]: None Premorbid Home Care/DME Agencies: None Transportation available at discharge: Friend & Daughter Resource referrals recommended: Support group (specify) ([facility] Support group)  Discharge Planning Living Arrangements: Alone Support Systems: Spouse/significant other;Children;Friends/neighbors Type of Residence: Private residence Insurance Resources: Medicare;Medicaid (specify county) Air cabin crew) Financial Resources: SSD Financial Screen Referred: No Living Expenses: Rent Money Management: Patient Do you  have any problems obtaining your medications?: No  Home Management: Slef and Edward Patient/Family Preliminary Plans: Return to his home with [facility].  Pt wants to bwe mod/i level before going home. Social Work Anticipated Follow Up Needs: HH/OP;Support Group  Clinical Impression Pleasant gentleman who has health issues from previously he has had to overcome to remain independent and live alone.  His girlfriend-Edward is supportive And will assist at discharge.  May need Neuro-psych to see while here for coping.  Lucy Chris 05/23/2012, 9:00 AM

## 2012-05-23 NOTE — Progress Notes (Signed)
Physical Therapy Session Note  Patient Details  Name: Edward Weber MRN: [id number] Date of Birth: [birth year]  Today's Date: 05/23/2012 Time: 7829-5621 Time Calculation (min): 27 min  Short Term Goals: Week 1:  PT Short Term Goal 1 (Week 1): pt will perform functional transfers with min A PT Short Term Goal 2 (Week 1): pt will gait in controlled environment 100' with min A PT Short Term Goal 3 (Week 1): pt will demo dynamic standing balance for functional activity with min A  Skilled Therapeutic Interventions/Progress Updates:    This session focused on bed mobility supine to sit with HOB flat and railing supervision due to fast speed of transfer.  WC mobility 150' supervision needed for left attention.  Transfers bed to WC and WC to NuStep min assist to make sure pt hit his targeted sitting surface.  NuStep level 4 x 10 mins moderate reported fatigue level.  Gait with RW 120' with min assist for balance especially as pt fatigued left leg would buckle.  Antalgic gait pattern favoring left painful foot.    Therapy Documentation Precautions:  Precautions Precautions: Fall Precaution Comments: painful L ankle due to gout Restrictions Weight Bearing Restrictions: No   Vital Signs: Therapy Vitals Temp: 97.9 F (36.6 C) Temp src: Oral Pulse Rate: 101 Resp: 22 BP: 149/95 mmHg Patient Position, if appropriate: Sitting Oxygen Therapy SpO2: 96 % O2 Device: None (Room air) Pain: Pain Assessment Pain Assessment: 0-10 Pain Score:   5 Pain Type: Acute pain (gout) Pain Location: Ankle Pain Orientation: Right;Left Pain Descriptors: Aching Patients Stated Pain Goal: 3 Pain Intervention(s): Repositioned;Ambulation/increased activity (pt reports he has already had pain meds)   Locomotion : Ambulation Ambulation/Gait Assistance: 4: Min assist Wheelchair Mobility Distance: 120   See FIM for current functional status  Therapy/Group: Individual Therapy  Lurena Joiner B. Elijan Googe, PT,  DPT [phone]  05/23/2012, 4:51 PM

## 2012-05-23 NOTE — Progress Notes (Addendum)
Physical Therapy Session Note  Patient Details  Name: Edward Weber MRN: [id number] Date of Birth: 12/25/1960  Today's Date: 05/23/2012 Time: 0902-1002 and 7829-5621 Time Calculation (min): 60 min and 45 min  Short Term Goals: Week 1:  PT Short Term Goal 1 (Week 1): pt will perform functional transfers with min A PT Short Term Goal 2 (Week 1): pt will gait in controlled environment 100' with min A PT Short Term Goal 3 (Week 1): pt will demo dynamic standing balance for functional activity with min A  Skilled Therapeutic Interventions/Progress Updates:   AM: neuromuscular re-education via forced use, VCs, demo for: -trunk shortening /lenthening with wt shifting while reaching out of BOS with L or R hand, feet unsupported -= bil UE use to propel w/c; pt tends to use very short, quick strokes with LUE, but did improve with VCs; demonstrated L inattention during turns and with obstacles  PM-  neuromuscular re-ed as above, for = wt bearing, eccentric control, muscle timing and sequencing bil bridging focusing  -lower trunk rotation focusing on flexibility -straight leg raises, L and R  -L hip abduction in R sidelying     Therapy Documentation Precautions:  Precautions Precautions: Fall Restrictions Weight Bearing Restrictions: No   Pain: Pain Assessment Pain Assessment: 0-10 Pain Score:   8 in AM; 6 in PM Pain Type: Other (Comment) (worse with wt bearing) Pain Location: Ankle Pain Orientation: Left Pain Descriptors: Aching Pain Onset: On-going Patients Stated Pain Goal: 3 Pain Intervention(s): Medication (See eMAR)  Pt reported that MD has changed meds for gout, this AM.  Mobility: transfers bed>< wc>< mat squat pivot with supervision, VCs for safety with RW, and assistance to prep w/c   Locomotion : gait x 50' x 2;  min guard assist; w/c mobility x 130' with supervision, bil UEs  x 2, VCs for most efficient technique       Exercises: AM in sitting due to al ankle  pain:  bil resisted hip adduction and hip abduction, hip flexion, knee extension 3# each L and R; gait with RW x 50' TDWB LLE due to pain, min assist  PM- -PROM bil hamstrings and hip flexors in sidelying to optimize muscle activation through resting length    See FIM for current functional status  Therapy/Group: Individual Therapy  Edward Weber 05/23/2012, 10:05 AM

## 2012-05-24 ENCOUNTER — Inpatient Hospital Stay (HOSPITAL_COMMUNITY): Payer: Medicare Other | Admitting: Physical Therapy

## 2012-05-24 ENCOUNTER — Inpatient Hospital Stay (HOSPITAL_COMMUNITY): Payer: Medicare Other | Admitting: Occupational Therapy

## 2012-05-24 DIAGNOSIS — M109 Gout, unspecified: Secondary | ICD-10-CM

## 2012-05-24 DIAGNOSIS — I619 Nontraumatic intracerebral hemorrhage, unspecified: Secondary | ICD-10-CM

## 2012-05-24 DIAGNOSIS — G8110 Spastic hemiplegia affecting unspecified side: Secondary | ICD-10-CM

## 2012-05-24 NOTE — Progress Notes (Signed)
Occupational Therapy Session Note  Patient Details  Name: Edward Weber MRN: [id number] Date of Birth: 09/07/1960  Today's Date: 05/24/2012 Time: 1000-1100 Time Calculation (min): 60 min  Short Term Goals: Week 1:  OT Short Term Goal 1 (Week 1): Pt. will transfer to toilet with minimal assist OT Short Term Goal 2 (Week 1): Pt. will maintain sitting balance duing dressing with close supervision OT Short Term Goal 3 (Week 1): Pt. will groom self with set up assist OT Short Term Goal 4 (Week 1): Pt. will bathe self in sitting/ standing with minimal assist OT Short Term Goal 5 (Week 1): Pt. will transfer to toilet with minimal assist  Skilled Therapeutic Interventions/Progress Updates:    Minimal c/o pain in left foot during therapy.  Pt stated that he bathed in the shower and dressed himself early in am with nurse tech in the room. In this session, Pt was able to don TED hose, socks, and shoes and tie shoes with distant supervision. Pt ambulated to gym with cane with supervision and worked on LUE strengthening with weighted medicine ball and hand held weights.  Worked in quadriped with alternating arm reaching forward and leg extensions. He also completed squat exercises to strengthen BLE. Pt walked to ADL apartment in which he completed tub bench transfers, making a bed, getting heavy pot and pans out of kitchen cupboards all with supervision.  He is also working with the theraputty to increase pinch strength.  Therapy Documentation Precautions:  Precautions Precautions: Fall Precaution Comments: painful L ankle due to gout Restrictions Weight Bearing Restrictions: No    ADL:  See FIM for current functional status  Therapy/Group: Individual Therapy  SAGUIER,JULIA 05/24/2012, 11:34 AM

## 2012-05-24 NOTE — Progress Notes (Signed)
Social Work Patient ID: Edward Weber, male   DOB: Dec 31, 1960, 52 y.o.   MRN: [id number] Met with pt who is feeling better and doing well.  He walked to the gym in therapy with the use of a cane and he felt good about this. Discussed with him he will probably not be here long and team conference tomorrow, will have target discharge date for tomorrow.

## 2012-05-24 NOTE — Progress Notes (Signed)
Physical Therapy Session Note  Patient Details  Name: Edward Weber MRN: [id number] Date of Birth: July 25, 1960  Today's Date: 05/24/2012 Time: Session #1: [id number], Session #2:  [id number] Time Calculation (min):Session #1: 57 min, Session #2:  43 min  Short Term Goals: Week 1:  PT Short Term Goal 1 (Week 1): pt will perform functional transfers with min A PT Short Term Goal 2 (Week 1): pt will gait in controlled environment 100' with min A PT Short Term Goal 3 (Week 1): pt will demo dynamic standing balance for functional activity with min A  Skilled Therapeutic Interventions/Progress Updates:    Session #1: Bed mobility mod I, sit to stand supervision, gait with SPC min guard assist due to occational LOB or grazing obstacles on his left side, down to supervision by end of session.  Girlfriend to bring his cane from home.  Stairs with cane and 1 rail alternating pattern supervision.  Car transfer with [facility] supervision with step into car strategy.  Education not to pull on door due to the fact that it moves and could make him off balance.  Bed mobility and furniture transfers in ADL apartment supervision. Standing balance exercises: tandem stand bil, feet together eyes closed, cone taps alternating. Biodex balance training, LOS, postural control and maze.  Berg Balance test 50/56.  Gait speed 2.69 ft/sec with SPC.  Ice applied to left foot after treatment due to antalgic gait pattern increased at end of session due to increased time on his feet.   Session #2: This session focused on gait with SPC working on head turns horiz/vert, increased/decreased gait speed, 180 degree turns with gait.  Up to min assist needed especially when looking left and 180 degree turns with surprise obstacles (people) once turned.  Using hallway rail: side steps, marches, tandem gait, retro gait x 50' each.  Instructed for light hand held assist on railing.  WC mobility >250' with supervision for monitoring of left hand and  left sided obstacles working on left hand and arm coordination and endurance/strength training.  This also gave [place] an opportunity to rest his left painful foot and ankle.  Ice applied to foot again after treatment with feet up in recliner.  Transfer WC to recliner with supervision and heavy reliance on upper extremity support on armrests to get to recliner chair safely.   Therapy Documentation Precautions:  Precautions Precautions: Fall Precaution Comments: painful L ankle due to gout Restrictions Weight Bearing Restrictions: No    Pain: Pain Assessment Pain Assessment: 0-10 Pain Score:   7 Pain Type: Acute pain Pain Location: Ankle Pain Orientation: Left Pain Radiating Towards: foot Pain Descriptors: Aching Pain Frequency: Intermittent Pain Onset: Gradual Patients Stated Pain Goal: 3 Pain Intervention(s): Repositioned;Ambulation/increased activity;Cold applied Multiple Pain Sites: No Locomotion : Ambulation Ambulation/Gait Assistance: 4: Min Government social research officer Distance: 250      Balance: Hospital doctor Sit to Stand: Able to stand without using hands and stabilize independently Standing Unsupported: Able to stand safely 2 minutes Sitting with Back Unsupported but Feet Supported on Floor or Stool: Able to sit safely and securely 2 minutes Stand to Sit: Sits safely with minimal use of hands Transfers: Able to transfer safely, definite need of hands Standing Unsupported with Eyes Closed: Able to stand 10 seconds with supervision Standing Ubsupported with Feet Together: Able to place feet together independently and stand 1 minute safely From Standing, Reach Forward with Outstretched Arm: Can reach confidently >25 cm (10") From Standing Position, Pick up Object  from Floor: Able to pick up shoe safely and easily From Standing Position, Turn to Look Behind Over each Shoulder: Looks behind from both sides and weight shifts well Turn 360 Degrees: Able to turn 360 degrees  safely in 4 seconds or less Standing Unsupported, Alternately Place Feet on Step/Stool: Able to stand independently and safely and complete 8 steps in 20 seconds Standing Unsupported, One Foot in Front: Able to plae foot ahead of the other independently and hold 30 seconds Standing on One Leg: Tries to lift leg/unable to hold 3 seconds but remains standing independently Total Score: 50  See FIM for current functional status  Therapy/Group: Individual Therapy  Lurena Joiner B. Kimmy Parish, PT, DPT [phone]   05/24/2012, 12:08 PM

## 2012-05-24 NOTE — Progress Notes (Signed)
Patient ID: Edward Weber, male   DOB: May 16, 1960, 52 y.o.   MRN: [id number]  Subjective/Complaints: 52 y.o. right-handed male with history of hypertension. Patient was independent prior to admission. Admitted 05/17/2012 a left-sided weakness and headache from [facility] emergency department. Initial blood pressure 163/99 received labetalol and hydralazine. MRI of the brain showed acute right lentiform nuclei hemorrhage 34 x 24 x 23 mm with surrounding edema. MRA of the head with atherosclerotic type changes. Echocardiogram with ejection fraction 45% grade 1 diastolic dysfunction . Carotid Dopplers with no significant ICA stenosis. Followup cranial CT scan 05/19/2012 stable appearance. Neurology services followup patient did not receive TPA secondary to hemorrhage. TEE completed 05/20/2012 positive PFO and venous Dopplers lower extremities completed that were negative. Slept poorly, "gout flared up".  DIagnosed with gout 8 mo ago.Has pork sausage on plate  Review of Systems  Musculoskeletal: Positive for joint pain.  Psychiatric/Behavioral: The patient has insomnia.        Insomnia due to foot/ankle pain  All other systems reviewed and are negative.    Objective: Vital Signs: Blood pressure 135/87, pulse 72, temperature 97.7 F (36.5 C), temperature source Oral, resp. rate 20, height 5' 8.5" (1.74 m), weight 83.915 kg (185 lb), SpO2 97.00%. No results found. Results for orders placed during the hospital encounter of 05/20/12 (from the past 72 hour(s))  CBC WITH DIFFERENTIAL     Status: Abnormal   Collection Time    05/23/12  7:45 AM      Result Value Range   WBC 8.5  4.0 - 10.5 K/uL   RBC 3.80 (*) 4.22 - 5.81 MIL/uL   Hemoglobin 13.1  13.0 - 17.0 g/dL   HCT 40.9 (*) 81.1 - 91.4 %   MCV 97.1  78.0 - 100.0 fL   MCH 34.5 (*) 26.0 - 34.0 pg   MCHC 35.5  30.0 - 36.0 g/dL   RDW 78.2  95.6 - 21.3 %   Platelets 133 (*) 150 - 400 K/uL   Neutrophils Relative 57  43 - 77 %   Neutro Abs  4.9  1.7 - 7.7 K/uL   Lymphocytes Relative 28  12 - 46 %   Lymphs Abs 2.4  0.7 - 4.0 K/uL   Monocytes Relative 13 (*) 3 - 12 %   Monocytes Absolute 1.1 (*) 0.1 - 1.0 K/uL   Eosinophils Relative 2  0 - 5 %   Eosinophils Absolute 0.2  0.0 - 0.7 K/uL   Basophils Relative 0  0 - 1 %   Basophils Absolute 0.0  0.0 - 0.1 K/uL  COMPREHENSIVE METABOLIC PANEL     Status: Abnormal   Collection Time    05/23/12  7:45 AM      Result Value Range   Sodium 131 (*) 135 - 145 mEq/L   Potassium 3.5  3.5 - 5.1 mEq/L   Chloride 92 (*) 96 - 112 mEq/L   CO2 28  19 - 32 mEq/L   Glucose, Bld 103 (*) 70 - 99 mg/dL   BUN 14  6 - 23 mg/dL   Creatinine, Ser 0.86  0.50 - 1.35 mg/dL   Calcium 9.1  8.4 - 57.8 mg/dL   Total Protein 7.5  6.0 - 8.3 g/dL   Albumin 3.1 (*) 3.5 - 5.2 g/dL   AST 61 (*) 0 - 37 U/L   ALT 47  0 - 53 U/L   Alkaline Phosphatase 102  39 - 117 U/L   Total Bilirubin 1.2  0.3 -  1.2 mg/dL   GFR calc non Af Amer >90  >90 mL/min   GFR calc Af Amer >90  >90 mL/min   Comment:            The eGFR has been calculated     using the CKD EPI equation.     This calculation has not been     validated in all clinical     situations.     eGFR's persistently     <90 mL/min signify     possible Chronic Kidney Disease.     HEENT: normal Cardio: RRR Resp: CTA B/L GI: BS positive and NT, no masses Extremity:  Edema Left ankle edema and erythema Skin:   Other vitiligo Neuro: Alert/Oriented, Flat, Cranial Nerve II-XII normal, Normal Sensory, Abnormal Motor 2-/5 in Left delt, bi, tri, grip, 4/5 L hip flex, knee ext, 2-/5 Bilateral ankle DF/PF limited by pain.  R side is 5/5 except ankle, Abnormal FMC Ataxic/ dec FMC and Dysarthric Musc/Skel:  Swelling Bilateral ankle joint swelling , Left toes 2-5 , pain with ROM and tenderness and Other Gen : NAD Mood/affect appropriate but flat   Assessment/Plan: 1. Functional deficits secondary to R lentiform nucleus ICH with Left HP which require 3+ hours per day  of interdisciplinary therapy in a comprehensive inpatient rehab setting. Physiatrist is providing close team supervision and 24 hour management of active medical problems listed below. Physiatrist and rehab team continue to assess barriers to discharge/monitor patient progress toward functional and medical goals. FIM: FIM - Bathing Bathing Steps Patient Completed: Chest;Right Arm;Left Arm;Abdomen;Left upper leg;Right upper leg;Buttocks;Front perineal area;Right lower leg (including foot);Left lower leg (including foot) Bathing: 5: Supervision: Safety issues/verbal cues  FIM - Upper Body Dressing/Undressing Upper body dressing/undressing steps patient completed: Thread/unthread right sleeve of pullover shirt/dresss;Thread/unthread left sleeve of pullover shirt/dress;Put head through opening of pull over shirt/dress;Pull shirt over trunk Upper body dressing/undressing: 5: Set-up assist to: Obtain clothing/put away FIM - Lower Body Dressing/Undressing Lower body dressing/undressing steps patient completed: Thread/unthread right underwear leg;Thread/unthread left underwear leg;Pull underwear up/down;Thread/unthread right pants leg;Thread/unthread left pants leg;Pull pants up/down;Fasten/unfasten right shoe;Don/Doff left shoe;Fasten/unfasten left shoe;Don/Doff right shoe Lower body dressing/undressing: 5: Set-up assist to: Don/Doff TED stocking  FIM - Toileting Toileting Assistive Devices: Grab bar or rail for support Toileting: 1: Total-Patient completed zero steps, helper did all 3  FIM - Diplomatic Services operational officer Devices: Elevated toilet seat Toilet Transfers: 3-To toilet/BSC: Mod A (lift or lower assist);3-From toilet/BSC: Mod A (lift or lower assist)  FIM - Bed/Chair Transfer Bed/Chair Transfer Assistive Devices: Bed rails;Arm rests Bed/Chair Transfer: 5: Supine > Sit: Supervision (verbal cues/safety issues);4: Bed > Chair or W/C: Min A (steadying Pt. > 75%);4: Chair or W/C  > Bed: Min A (steadying Pt. > 75%)  FIM - Locomotion: Wheelchair Distance: 120 Locomotion: Wheelchair: 2: Travels 50 - 149 ft with supervision, cueing or coaxing FIM - Locomotion: Ambulation Locomotion: Ambulation Assistive Devices: Designer, industrial/product Ambulation/Gait Assistance: 4: Min assist Locomotion: Ambulation: 2: Travels 50 - 149 ft with minimal assistance (Pt.>75%)  Comprehension Comprehension Mode: Auditory Comprehension: 5-Understands complex 90% of the time/Cues < 10% of the time  Expression Expression Mode: Verbal Expression: 7-Expresses complex ideas: With no assist  Social Interaction Social Interaction: 6-Interacts appropriately with others with medication or extra time (anti-anxiety, antidepressant).  Problem Solving Problem Solving: 5-Solves basic 90% of the time/requires cueing < 10% of the time  Memory Memory: 6-More than reasonable amt of time Medical Problem List and  Plan:  1. Right basal ganglia hemorrhage.  2. DVT Prophylaxis/Anticoagulation: SCDs. Monitor for any signs of DVT. Dopplers negative  3. Pain Management/headaches: Neurontin 100 mg twice a day, Ultram as needed. Monitor with increased mobility  4. Neuropsych: This patient is capable of making decisions on his/her own behalf.  5. Mood/depression. Sinequan 25 mg each bedtime, Cymbalta 60 mg daily. Provide emotional support and positive reinforcement  6. Hypertension. Hydrochlorothiazide 25 mg. Monitor with increased activity. Educate pt regarding secondary prevention of ICH/stroke 7.  Gout with acute flare, hold allopurinol, cont colchicine, add medrol burst and taper. Restart allopurinol when flare resolved, diet education  LOS (Days) 4 A FACE TO FACE EVALUATION WAS PERFORMED  KIRSTEINS,ANDREW E 05/24/2012, 7:53 AM

## 2012-05-24 NOTE — Progress Notes (Signed)
Occupational Therapy Session Note  Patient Details  Name: NIKHIL OSEI MRN: [id number] Date of Birth: Dec 28, 1960  Today's Date: 05/24/2012 Time: 1300-1330 Time Calculation (min): 30 min  Skilled Therapeutic Interventions/Progress Updates:    Pt ambulated to the gym with use of his single point cane and min guard assist.  Used UE ergonometer for LUE strengthening.  Pt performed 4 sets of 3 mins each on the ergonometer.  Two sets were using bilateral UEs, 1 peddling forward, and 1 peddling backwards, with resistance set at level 10 for both.  Performed 2 more sets with resistance set at 10 but this time only using the LUE.  Pt able to perform all sets with RPMs kept at 25-30 throughout.  Finished by having pt perform 2 sets of 15 repetitions for bilateral shoulder flexion using cane.  Also had pt perform one set of 10 repetitions for scapula adduction and depression with min instructional cueing for technique.  Encouraged pt to continue these 2 exercises in his room throughout the day and when resting.    Therapy Documentation Precautions:  Precautions Precautions: Fall Precaution Comments: painful L ankle due to gout Restrictions Weight Bearing Restrictions: No  Pain: Pain Assessment Pain Assessment: No/denies pain Pain Score:   7 Pain Type: Acute pain Pain Location: Ankle Pain Orientation: Left Pain Radiating Towards: foot Pain Intervention(s): Repositioned;Ambulation/increased activity;Cold applied ADL: See FIM for current functional status  Therapy/Group: Individual Therapy  [place] OTR/L 05/24/2012, 3:59 PM

## 2012-05-25 ENCOUNTER — Inpatient Hospital Stay (HOSPITAL_COMMUNITY): Payer: Medicare Other

## 2012-05-25 ENCOUNTER — Inpatient Hospital Stay (HOSPITAL_COMMUNITY): Payer: Medicare Other | Admitting: Occupational Therapy

## 2012-05-25 NOTE — Progress Notes (Signed)
Patient ID: Edward Weber, male   DOB: [birth year], 52 y.o.   MRN: [id number]  Subjective/Complaints: 52 y.o. right-handed male with history of hypertension. Patient was independent prior to admission. Admitted 05/17/2012 a left-sided weakness and headache from [facility] emergency department. Initial blood pressure 163/99 received labetalol and hydralazine. MRI of the brain showed acute right lentiform nuclei hemorrhage 34 x 24 x 23 mm with surrounding edema. MRA of the head with atherosclerotic type changes. Echocardiogram with ejection fraction 45% grade 1 diastolic dysfunction . Carotid Dopplers with no significant ICA stenosis. Followup cranial CT scan 05/19/2012 stable appearance. Neurology services followup patient did not receive TPA secondary to hemorrhage. TEE completed 05/20/2012 positive PFO and venous Dopplers lower extremities completed that were negative. Gout pain in ankles doing better but has Sciatic pain shooting from back to LLE noted during therapy by PT.  Pt states he has had that problem for years but has not sought medical attention  Review of Systems  Musculoskeletal: Positive for joint pain.  Psychiatric/Behavioral: The patient has insomnia.        Insomnia due to foot/ankle pain  All other systems reviewed and are negative.    Objective: Vital Signs: Blood pressure 135/99, pulse 88, temperature 97.8 F (36.6 C), temperature source Oral, resp. rate 18, height 5' 8.5" (1.74 m), weight 83.915 kg (185 lb), SpO2 94.00%. No results found. Results for orders placed during the hospital encounter of 05/20/12 (from the past 72 hour(s))  CBC WITH DIFFERENTIAL     Status: Abnormal   Collection Time    05/23/12  7:45 AM      Result Value Range   WBC 8.5  4.0 - 10.5 K/uL   RBC 3.80 (*) 4.22 - 5.81 MIL/uL   Hemoglobin 13.1  13.0 - 17.0 g/dL   HCT 82.9 (*) 56.2 - 13.0 %   MCV 97.1  78.0 - 100.0 fL   MCH 34.5 (*) 26.0 - 34.0 pg   MCHC 35.5  30.0 - 36.0 g/dL   RDW 86.5   78.4 - 69.6 %   Platelets 133 (*) 150 - 400 K/uL   Neutrophils Relative 57  43 - 77 %   Neutro Abs 4.9  1.7 - 7.7 K/uL   Lymphocytes Relative 28  12 - 46 %   Lymphs Abs 2.4  0.7 - 4.0 K/uL   Monocytes Relative 13 (*) 3 - 12 %   Monocytes Absolute 1.1 (*) 0.1 - 1.0 K/uL   Eosinophils Relative 2  0 - 5 %   Eosinophils Absolute 0.2  0.0 - 0.7 K/uL   Basophils Relative 0  0 - 1 %   Basophils Absolute 0.0  0.0 - 0.1 K/uL  COMPREHENSIVE METABOLIC PANEL     Status: Abnormal   Collection Time    05/23/12  7:45 AM      Result Value Range   Sodium 131 (*) 135 - 145 mEq/L   Potassium 3.5  3.5 - 5.1 mEq/L   Chloride 92 (*) 96 - 112 mEq/L   CO2 28  19 - 32 mEq/L   Glucose, Bld 103 (*) 70 - 99 mg/dL   BUN 14  6 - 23 mg/dL   Creatinine, Ser 2.95  0.50 - 1.35 mg/dL   Calcium 9.1  8.4 - 28.4 mg/dL   Total Protein 7.5  6.0 - 8.3 g/dL   Albumin 3.1 (*) 3.5 - 5.2 g/dL   AST 61 (*) 0 - 37 U/L   ALT 47  0 -  53 U/L   Alkaline Phosphatase 102  39 - 117 U/L   Total Bilirubin 1.2  0.3 - 1.2 mg/dL   GFR calc non Af Amer >90  >90 mL/min   GFR calc Af Amer >90  >90 mL/min   Comment:            The eGFR has been calculated     using the CKD EPI equation.     This calculation has not been     validated in all clinical     situations.     eGFR's persistently     <90 mL/min signify     possible Chronic Kidney Disease.     HEENT: normal Cardio: RRR Resp: CTA B/L GI: BS positive and NT, no masses Extremity:  Edema Left ankle edema and erythema Skin:   Other vitiligo Neuro: Alert/Oriented, Flat, Cranial Nerve II-XII normal, Normal Sensory, Abnormal Motor 2-/5 in Left delt, bi, tri, grip, 4/5 L hip flex, knee ext, 2-/5 Bilateral ankle DF/PF limited by pain.  R side is 5/5 except ankle, Abnormal FMC Ataxic/ dec FMC and Dysarthric Musc/Skel:  Swelling Bilateral ankle joint swelling , Left toes 2-5 , pain with ROM and tenderness and Other Gen : NAD Mood/affect appropriate but  flat -SLR  Assessment/Plan: 1. Functional deficits secondary to R lentiform nucleus ICH with Left HP which require 3+ hours per day of interdisciplinary therapy in a comprehensive inpatient rehab setting. Physiatrist is providing close team supervision and 24 hour management of active medical problems listed below. Physiatrist and rehab team continue to assess barriers to discharge/monitor patient progress toward functional and medical goals. FIM: FIM - Bathing Bathing Steps Patient Completed: Chest;Right Arm;Left Arm;Abdomen;Left upper leg;Right upper leg;Buttocks;Front perineal area;Right lower leg (including foot);Left lower leg (including foot) Bathing: 5: Supervision: Safety issues/verbal cues  FIM - Upper Body Dressing/Undressing Upper body dressing/undressing steps patient completed: Thread/unthread right sleeve of pullover shirt/dresss;Thread/unthread left sleeve of pullover shirt/dress;Put head through opening of pull over shirt/dress;Pull shirt over trunk Upper body dressing/undressing: 5: Set-up assist to: Obtain clothing/put away FIM - Lower Body Dressing/Undressing Lower body dressing/undressing steps patient completed: Thread/unthread right underwear leg;Thread/unthread left underwear leg;Pull underwear up/down;Thread/unthread right pants leg;Thread/unthread left pants leg;Pull pants up/down;Fasten/unfasten right shoe;Don/Doff left shoe;Fasten/unfasten left shoe;Don/Doff right shoe Lower body dressing/undressing: 5: Set-up assist to: Don/Doff TED stocking  FIM - Toileting Toileting Assistive Devices: Grab bar or rail for support Toileting: 1: Total-Patient completed zero steps, helper did all 3  FIM - Diplomatic Services operational officer Devices: Elevated toilet seat Toilet Transfers: 3-To toilet/BSC: Mod A (lift or lower assist);3-From toilet/BSC: Mod A (lift or lower assist)  FIM - Bed/Chair Transfer Bed/Chair Transfer Assistive Devices: Bed rails;Arm  rests Bed/Chair Transfer: 6: Supine > Sit: No assist;6: Sit > Supine: No assist;5: Bed > Chair or W/C: Supervision (verbal cues/safety issues);5: Chair or W/C > Bed: Supervision (verbal cues/safety issues)  FIM - Locomotion: Wheelchair Distance: 250 Locomotion: Wheelchair: 5: Travels 150 ft or more: maneuvers on rugs and over door sills with supervision, cueing or coaxing FIM - Locomotion: Ambulation Locomotion: Ambulation Assistive Devices: Walker - Rolling;Cane - Straight Ambulation/Gait Assistance: 4: Min guard Locomotion: Ambulation: 4: Travels 150 ft or more with minimal assistance (Pt.>75%)  Comprehension Comprehension Mode: Auditory Comprehension: 5-Understands complex 90% of the time/Cues < 10% of the time  Expression Expression Mode: Verbal Expression: 7-Expresses complex ideas: With no assist  Social Interaction Social Interaction: 6-Interacts appropriately with others with medication or extra time (anti-anxiety, antidepressant).  Problem  Solving Problem Solving: 5-Solves basic 90% of the time/requires cueing < 10% of the time  Memory Memory: 6-More than reasonable amt of time Medical Problem List and Plan:  1. Right basal ganglia hemorrhage.  2. DVT Prophylaxis/Anticoagulation: SCDs. Monitor for any signs of DVT. Dopplers negative  3. Pain Management/headaches: Neurontin 100 mg twice a day, Ultram as needed. Monitor with increased mobility  4. Neuropsych: This patient is capable of making decisions on his/her own behalf.  5. Mood/depression. Sinequan 25 mg each bedtime, Cymbalta 60 mg daily. Provide emotional support and positive reinforcement  6. Hypertension. Hydrochlorothiazide 25 mg. Monitor with increased activity. Educate pt regarding secondary prevention of ICH/stroke 7.  Gout with acute flare, hold allopurinol, cont colchicine, add medrol burst and taper. Restart allopurinol when flare resolved, diet education 8.  Sciatic pain , difficult to do exam due to  concommitant CVA with L HP, interfering with therapy, check MRI LOS (Days) 5 A FACE TO FACE EVALUATION WAS PERFORMED  Shravya Wickwire E 05/25/2012, 9:13 AM

## 2012-05-25 NOTE — Progress Notes (Signed)
Occupational Therapy Session Note  Patient Details  Name: Edward Weber MRN: [id number] Date of Birth: [birth year]  Today's Date: 05/25/2012 Time: 4098-1191 Time Calculation (min): 46 min   Skilled Therapeutic Interventions/Progress Updates:    Pt seen for 1:1 OT simple meal prep and home management  activity with focus on functional mobility and dynamic standing balance. Pt reported pain in ankle at start of session, but was willing to participate. Pt at overall supervision this session. Pt engaged in baking activity and unloading dishwasher with focus on balance, transporting items when ambulating and reaching into high and low cabinets with supervision. Pt with occasional slight stumbling when turning quickly to Lt, but was able to maintain balance without intervention. Pt engaged in dynamic standing balance activity using Biodex on 'random test' setting with score of 78%. Pt engaged in bean bag toss activity with focus on reaching outside BOS and righting reactions requiring supervision only. Pt carried bucket weighted with bean bags the length of the gym without assistive device with no losses of balance.   Therapy Documentation Precautions:  Precautions Precautions: Fall Precaution Comments: painful L ankle due to gout Restrictions Weight Bearing Restrictions: No    Pain: Pain Assessment Pain Score:   1 Pain Type: Acute pain Pain Location: Ankle Pain Orientation: Left Pain Intervention(s): Denied intervention    See FIM for current functional status  Therapy/Group: Individual Therapy  Sunday Spillers 05/25/2012, 3:09 PM

## 2012-05-25 NOTE — Progress Notes (Signed)
Occupational Therapy Session Note  Patient Details  Name: Edward Weber MRN: [id number] Date of Birth: March 19, 1961  Today's Date: 05/25/2012 Time: 4098-1191 Time Calculation (min): 42 min  Short Term Goals: Week 1:  OT Short Term Goal 1 (Week 1): Pt. will transfer to toilet with minimal assist OT Short Term Goal 2 (Week 1): Pt. will maintain sitting balance duing dressing with close supervision OT Short Term Goal 3 (Week 1): Pt. will groom self with set up assist OT Short Term Goal 4 (Week 1): Pt. will bathe self in sitting/ standing with minimal assist OT Short Term Goal 5 (Week 1): Pt. will transfer to toilet with minimal assist  Skilled Therapeutic Interventions/Progress Updates:    Pt performed bathing and dressing to begin session.  He utilized cane to gather all of his supplies and perform shower sit to stand using shower bench when washing his legs and his hair.  The rest of the shower he performed standing.  Overall supervision level for all aspects of bathing, dressing, and grooming.  When finished ambulated to the gym.  Worked on closed chain weightbearing in quadriped with modified pushups and reaching forward with the right hand while the left supported.  Pt able to perform without difficulty.  Progressed to prone with focus on scapular exercises with 3 sets of 10 repetitions: arms at his side, adducted to 90 degrees, and abducted to 90 degrees with elbow flexed.  Slight increased difficulty with adducting the left scapula.  Performed 2 sets of shoulder flexion and shoulder abduction with 2lb weight and 3lb weight, 10 repetitions for each set.  Finished session with ball catch and toss using different size balls without difficulty.    Therapy Documentation Precautions:  Precautions Precautions: Fall Precaution Comments: painful L ankle due to gout Restrictions Weight Bearing Restrictions: No  Pain: Pain Assessment Pain Assessment: No/denies pain Pain Score:   1 Pain  Location: Ankle Pain Orientation: Left Pain Intervention(s): Medication (See eMAR)   See FIM for current functional status  Therapy/Group: Individual Therapy  Shaniqua Guillot OTR/L  05/25/2012, 12:07 PM

## 2012-05-25 NOTE — Progress Notes (Signed)
Physical Therapy Session Note  Patient Details  Name: Edward Weber MRN: [id number] Date of Birth: [birth year]  Today's Date: 05/25/2012 Time: 0802-0900 and 1310-1350 Time Calculation (min): 58 min and 40 min  Short Term Goals: Week 1:  PT Short Term Goal 1 (Week 1): pt will perform functional transfers with min A PT Short Term Goal 2 (Week 1): pt will gait in controlled environment 100' with min A PT Short Term Goal 3 (Week 1): pt will demo dynamic standing balance for functional activity with min A  Skilled Therapeutic Interventions/Progress Updates:   1st treatment:  C curve scoliosis noted, muscle mass L quad < R; pt aware.  He also stated that he has sciatic pain LLE occasionally x 10 years  that is controlled by Motrin.  Therapist notified MD.  Gait training with personal SPC x 125' x 2 with superviison.  Cues and demo for safest sit>< stand using SPC in R hand. High level gait, retrieving items from floor, with close superviison, without cane Floor transfer modified independent, after 1 demo   neuromuscular re-education via demo, forced use, tactile and VCs for :  -L hip stability, via closed chain hip abduction, 4 point alternating shoulder and hip extension, with min assist  -coordination during high level gait of braiding with min assist -coordination for heel/shin slides in standing -coordination/balance while dribbling ball alternating hands while walking, with min assist for LOB L and forward due to feet not keeping up with hands -eccentric control during stand> sit -gait up/down flight of stairs with 1 rail, transporting SPC, min assist> supervision, VCS, tactile cues for L foot placement -seated bil resisted hip adduction -standing balance retraining on compliant mat: wt shifting, toes up/down, with min assist> close guard   2nd session:  Gait in room with [facility] simulated toilet transfer, opening and closing bureau drawers, modified independent  Gait x 150' with SPC   X 2 , VCs for wider BOS, forward gaze, supervision.  neuromuscular re-education: - via Biodex for postural stability, wt shifting, and limits of stability.  Pt tended to have COG to L and posterior during training, but improved with practice -with demo, VCs for sidestepping L and R   Pt will need to be checked out for actual toilet transfer; if safely independent, pt can be made modified independent in his room..     Therapy Documentation Precautions:  Precautions Precautions: Fall Precaution Comments: painful L ankle due to gout Restrictions Weight Bearing Restrictions: No Pain: Pain Assessment Pain Score:   1st session; 5 after 2nd session, due to increased wt bearing LLE; treated with ice pack in room Pain Location: Ankle Pain Orientation: Left Pain Intervention(s): Medication (See eMAR)   Locomotion : Ambulation Ambulation/Gait Assistance: 5: Supervision      See FIM for current functional status  Therapy/Group: Individual Therapy  Alonda Weaber 05/25/2012, 9:28 AM

## 2012-05-25 NOTE — Patient Care Conference (Signed)
Inpatient RehabilitationTeam Conference and Plan of Care Update Date: 05/25/2012   Time: 11:10 AM    Patient Name: Edward Weber      Medical Record Number: [id number]  Date of Birth: [birth year] Sex: Male         Room/Bed: 4140/4140-01 Payor Info: Payor: MEDICARE  Plan: MEDICARE PART A AND B  Product Type: *No Product type*     Admitting Diagnosis: R B AND ICH  Admit Date/Time:  05/20/2012  2:31 PM Admission Comments: No comment available   Primary Diagnosis:  <principal problem not specified> Principal Problem: <principal problem not specified>  Patient Active Problem List   Diagnosis Date Noted  . PFO (patent foramen ovale) 05/20/2012  . Stroke, bilateral, acute, embolic 05/19/2012  . Hypertension 05/19/2012  . Headache 05/19/2012  . Cytotoxic brain edema 05/19/2012  . Hemiplegia, unspecified, affecting nondominant side 05/17/2012  . Intracerebral hemorrhage 05/17/2012    Expected Discharge Date: Expected Discharge Date: 05/27/12  Team Members Present: Physician leading conference: Dr. Claudette Laws Nurse Present: Gregor Hams, RN PT Present: Edman Circle, PT;Caroline Adriana Simas, PT;Other (comment) Clarisse Gouge Ripa-PT) OT Present: Bretta Bang, Verlene Mayer, OT SLP Present: Fae Pippin, SLP Other (Discipline and Name): Charolette Child Coordinator     Current Status/Progress Goal Weekly Team Focus  Medical   Sciatic discomfort left lower extremity. Left upper greater than left lower extremity weakness.  Assess and treat sciatic discomfort  Check MRI of the lumbar spine   Bowel/Bladder   Continent of bowel and bladder. LBM 05/23/2012  Remain continent of bowel and bladder.      Swallow/Nutrition/ Hydration             ADL's   supervision with cane  mod I  dynamic balance, LUE strength, functional mobility during ADLs, pt education   Mobility   min assist to occasionally supervision  modified independent with transfers, gait and stairs  balance, high level gait,  stairs, patient/family education   Communication             Safety/Cognition/ Behavioral Observations            Pain   Pain to left ankle due to gout relief by Tramadol 50-100 mg q 6 hr. PRN/Hydrocodone 5/325mg  q 6 hr PRN   Pain level 3 or less on a scale of 0-10  Assess and monitor any onset of pain and modify as needed.   Skin   Abrasion around rectum area. Protective skin barrier applied. Turn q 2 hours .  No new skin breakdown or infection  Monitor and assess skin q shift.      *See Interdisciplinary Assessment and Plan and progress notes for long and short-term goals  Barriers to Discharge: Still requiring physical assistance for ADLs and mobility    Possible Resolutions to Barriers:  Upgrade functional status to supervision or modified independent    Discharge Planning/Teaching Needs:  Home girlfreind can stay with, short time.  At times may be alone      Team Discussion:  Pt's gout pain better.  Doing well and making good gains.  MRI checks by MD.  Safety issues with inattention and weakness.  Will need OP therapies.  Girlfriend to be there with him  Revisions to Treatment Plan:  None   Continued Need for Acute Rehabilitation Level of Care: The patient requires daily medical management by a physician with specialized training in physical medicine and rehabilitation for the following conditions: Daily direction of a multidisciplinary physical rehabilitation program to ensure  safe treatment while eliciting the highest outcome that is of practical value to the patient.: Yes Daily medical management of patient stability for increased activity during participation in an intensive rehabilitation regime.: Yes Daily analysis of laboratory values and/or radiology reports with any subsequent need for medication adjustment of medical intervention for : Neurological problems  Cayman Kielbasa, Lemar Livings 05/25/2012, 1:14 PM

## 2012-05-25 NOTE — Progress Notes (Signed)
Brief Nutrition Note  RD reviewed chart.  Body mass index is 27.72 kg/(m^2). Pt meets criteria for overweight based on current BMI.  Current diet order is Regular, patient is consuming approximately 50 - 75% of meals at this time. Pt denies wt loss or concerns with appetite at this time. Pt with snacks in room, states that his appetite has improved. Denied any questions/concerns regarding nutrition at this time. Labs and medications reviewed. No skin breakdown noted.   No nutrition interventions warranted at this time.  If additional nutrition issues arise, please consult RD.   Edward Motto MS, RD, LDN Pager: [phone] After-hours pager: [phone]

## 2012-05-25 NOTE — Progress Notes (Signed)
Reviewed and agree with the attached treatment note.  Rashawn Rayman, OTR/L 

## 2012-05-25 NOTE — Progress Notes (Signed)
Social Work Patient ID: Edward Weber, male   DOB: [birth year], 52 y.o.   MRN: [id number] Met with pt to inform of team conference goals-mod/i level and discharge 2/14.  He is very pleased with how well he is doing.  He reports his girlfriend can And plans to assist him at home for a short time.  She can transport him to OP therapies.  He needs a letter for his court date he will miss tomorrow.  Will type a letter And have MD sign regardng pt being a pt in the hospital and inability of making court date tomorrow.  He is agreeable to tub bench and wants this worker to obtain one for him. Continue to work on discharge needs.

## 2012-05-26 ENCOUNTER — Inpatient Hospital Stay (HOSPITAL_COMMUNITY): Payer: Medicare Other

## 2012-05-26 ENCOUNTER — Inpatient Hospital Stay (HOSPITAL_COMMUNITY): Payer: Medicare Other | Admitting: Occupational Therapy

## 2012-05-26 MED ORDER — GABAPENTIN 300 MG PO CAPS
300.0000 mg | ORAL_CAPSULE | Freq: Two times a day (BID) | ORAL | Status: DC
  Administered 2012-05-26 – 2012-05-27 (×2): 300 mg via ORAL
  Filled 2012-05-26 (×6): qty 1

## 2012-05-26 NOTE — Progress Notes (Signed)
Occupational Therapy Session Note  Patient Details  Name: Edward Weber MRN: [id number] Date of Birth: [birth year]  Today's Date: 05/26/2012 Time: 1100-1200 and 1300-1345 and 4098-1191 Time Calculation (min): 60 min and and 27 min  Short Term Goals: Week 1:  OT Short Term Goal 1 (Week 1): Pt. will transfer to toilet with minimal assist OT Short Term Goal 2 (Week 1): Pt. will maintain sitting balance duing dressing with close supervision OT Short Term Goal 3 (Week 1): Pt. will groom self with set up assist OT Short Term Goal 4 (Week 1): Pt. will bathe self in sitting/ standing with minimal assist OT Short Term Goal 5 (Week 1): Pt. will transfer to toilet with minimal assist  Skilled Therapeutic Interventions/Progress Updates:    Visit 1:  No c/o of pain during session.  Pt seen for BADL retraining of toileting, bathing, and dressing in ADL apartment with a focus on pt completing all tasks at a mod I level.  Pt was able to ambulate from room all the way to ADL apartment and back with mod I using cane in right hand while carrying a bucket filled with supplies for B/D in his left.  Pt completed all tasks without LOB. He was then seen in gym to work on stair mobility by using right hand to steady self on wall going up and left hand on wall going down. Pt does not have rails but he does have a wall on right side of stairs.  Pt then worked on a variety of dynamic balance and UE strengthening.  He has 70 lbs grasp in both hands.  Visit 2: Pt worked on UE strengthening with UBE at level 6 for 15 minutes at a fast pace. He then worked on quadriped exercises for shoulder stability and strengthening along with balance by completing bird dog exercises.  In side lying: left hip abduction exercises. In supine: bridging with BLE and then with LLE only.  Pt tolerated all the exercises well.  Visit 3: Pt seen for floor transfers (mod I) and Pilates based exercises for core strengthening including  alternating knee extensions in supine, LLE leg extensions, sidelying on left shoulder with side planks. Pt also worked on Editor, commissioning with rebounder and lunging to reach for cones.  Discussed and practiced using his core muscles to reduce stress on his back with reaching to floor level.   Therapy Documentation Precautions:  Precautions Precautions: Fall Precaution Comments: painful L ankle due to gout Restrictions Weight Bearing Restrictions: No   Pain: Pain Assessment Pain Assessment: No/denies pain ADL:  See FIM for current functional status  Therapy/Group: Individual Therapy  SAGUIER,JULIA 05/26/2012, 12:31 PM

## 2012-05-26 NOTE — Discharge Summary (Signed)
NAMEEYTHAN, JAYNE NO.:  [id number]  MEDICAL RECORD NO.:  [id number]  LOCATION:  4009                         FACILITY:  [facility]  PHYSICIAN:  Mariam Dollar, P.A.  DATE OF BIRTH:  [birth year]  DATE OF ADMISSION:  05/20/2012 DATE OF DISCHARGE:  05/27/2012                              DISCHARGE SUMMARY   DISCHARGE DIAGNOSES: 1. Right basal ganglia hemorrhage. 2. Sequential compression devices for deep vein thrombosis     prophylaxis. 3. Pain management. 4. Depression. 5. Hypertension.  HISTORY OF PRESENT ILLNESS:  This is a 52 year old right-handed male with history of hypertension, independent prior to admission who was admitted May 17, 2012, with left-sided weakness and headache. Initial blood pressure 163/99, received labetalol.  MRI of the brain showed acute right lentiform hemorrhage 34 x 24 x 23 mm with surrounding edema.  MRA of the head with atherosclerotic type changes. Echocardiogram with ejection fraction 45% grade 1 diastolic dysfunction. Carotid Dopplers with no ICA stenosis.  Followup cranial CT scan May 19, 2012, stable.  Neurology Services consulted.  The patient did not receive tPA secondary to hemorrhage.  A TEE was completed May 20, 2012, showing positive PFO, venous Doppler studies lower extremities completed that were negative.  Physical and occupational therapy ongoing patient was admitted for comprehensive rehab program.  PAST MEDICAL HISTORY:  See discharge diagnoses.  SOCIAL HISTORY:  Lives alone, 1 level home.  Functional history prior to admission was independent.  He is on disability.  Functional status upon admission to rehab services; +2 total assist for stand pivot transfers.  PHYSICAL EXAMINATION:  VITAL SIGNS:  Blood pressure 155/100, pulse 63, temperature 97.9 respirations 18. GENERAL:  This is an alert male, in no acute distress oriented x3. LUNGS:  Clear to auscultation. CARDIAC:  Rate controlled. ABDOMEN:   Soft, nontender.  Good bowel sounds.  Pupils round and reactive to light.  He did have a mild right gaze preference.  Speech mildly dysarthric, but fully intelligible.  REHABILITATION HOSPITAL COURSE:  The patient was admitted to inpatient rehab services with therapies initiated on a 3-hour daily basis consisting of physical therapy, occupational therapy, and rehabilitation nursing.  The following issues were addressed during the patient's rehabilitation stay.  Pertaining to Mr. Brackeen's right basal ganglia hemorrhage remained stable.  He would follow up with Neurology Services. Sequential compression devices for deep VT prophylaxis.  He did have bouts of headaches controlled with the use of hydrocodone.  Blood pressures monitored with hydrochlorothiazide.  He had a history of depression.  He remained on Sinequan as well as Cymbalta with emotional support provided.  He did have mild gout flare up.  He was placed on a Medrol dose pack.  We would restart his allopurinol when flare resolved, he was able to attend to his therapies.  The patient received weekly collaborative interdisciplinary team conferences to discuss estimated length of stay, family teaching, and any barriers to discharge.  He was continent of bowel and bladder, required supervision with a cane for activities daily living, minimal assist, occasional supervision for functional mobility.  His girlfriend did receive family teaching and would be with him at home to provide assistance.  DISCHARGE MEDICATIONS:  At the time of dictation included 1. Colchicine 0.6 mg b.i.d. 2. Sinequan 25 mg at bedtime. 3. Cymbalta 60 mg daily. 4. Neurontin 100 mg b.i.d. 5. Hydrochlorothiazide 25 mg p.o. daily. 6. Hydrocodone 1 tablet every 6 hours as needed for pain. 7. The patient was to complete Medrol dose pack as advised. 8. Protonix 40 mg daily. 9. Carafate 1 g t.i.d.  SPECIAL INSTRUCTIONS:  The patient could resume his allopurinol  once his gout flare up, had resolved. He would follow up Dr. Erick Colace, the outpatient rehab center June 17, 2012, Dr. Delia Heady, call for appointment 1 month, Dr. Darrick Huntsman, medical management appointment to be made.  Ongoing therapies were dictated as per rehab services.     Mariam Dollar, P.A.     DA/MEDQ  D:  05/26/2012  T:  05/26/2012  Job:  [id number]  cc:   Erick Colace, M.D. Pramod P. Pearlean Brownie, MD Duncan Dull, M.D.

## 2012-05-26 NOTE — Progress Notes (Signed)
Patient ID: Edward Weber, male   DOB: September 14, 1960, 52 y.o.   MRN: [id number]  Subjective/Complaints: 52 y.o. right-handed male with history of hypertension. Patient was independent prior to admission. Admitted 05/17/2012 a left-sided weakness and headache from [name] emergency department. Initial blood pressure 163/99 received labetalol and hydralazine. MRI of the brain showed acute right lentiform nuclei hemorrhage 34 x 24 x 23 mm with surrounding edema. MRA of the head with atherosclerotic type changes. Echocardiogram with ejection fraction 45% grade 1 diastolic dysfunction . Carotid Dopplers with no significant ICA stenosis. Followup cranial CT scan 05/19/2012 stable appearance. Neurology services followup patient did not receive TPA secondary to hemorrhage. TEE completed 05/20/2012 positive PFO and venous Dopplers lower extremities completed that were negative.  LLE pain from back goes to L ankle not foot, mainly with standing Review of Systems  Musculoskeletal: Positive for joint pain.  Psychiatric/Behavioral: The patient has insomnia.        Insomnia due to foot/ankle pain  All other systems reviewed and are negative.    Objective: Vital Signs: Blood pressure 119/81, pulse 81, temperature 97.8 F (36.6 C), temperature source Oral, resp. rate 18, height 5' 8.5" (1.74 m), weight 83.915 kg (185 lb), SpO2 98.00%. [name]  05/25/2012  *RADIOLOGY REPORT*  Clinical Data: Low back and left leg pain.  MRI LUMBAR SPINE WITHOUT CONTRAST  Technique:  Multiplanar and multiecho pulse sequences of the lumbar spine were obtained without intravenous contrast.  Comparison: None.  Findings: The patient has chronic bilateral L5 pars interarticularis defects with 1.2 cm of anterolisthesis of L5 on S1.  Alignment is otherwise unremarkable.  Marrow signal is mildly heterogeneous.  Intense degenerative endplate signal change is present about the L5-S1 level.  The conus medullaris is  normal in signal and position.  Imaged intra-abdominal contents are unremarkable.  The T11-12 to L3-4 levels are negative.  L4-5:  Mild disc bulge with ligamentum flavum thickening and facet arthropathy.  The central canal is open.  Moderate to moderately severe foraminal narrowing appears worse on the left.  L5-S1:  The disc is uncovered with a minimal bulge.  The foramina are severely narrowed bilaterally due to anterolisthesis.  The exiting L5 roots are flattened within the foramina.  The central canal appears mildly narrowed.  S1-2:  Negative.  IMPRESSION:  1.  Bilateral L5 pars interarticularis defects with 1.2 cm of anterolisthesis of L5 on S1.  Foramen are severely narrowed and the exiting L5 roots are flattened within the foramina. 2.  Mild disc bulge and facet arthropathy at L4-5 results in moderate to moderately severe foraminal narrowing, worse on the left.  Central canal is open.   Original Report Authenticated By: [name], M.D.    No results found for this or any previous visit (from the past 72 hour(s)).   HEENT: normal Cardio: RRR Resp: CTA B/L GI: BS positive and NT, no masses Extremity:  Edema Left ankle edema and erythema Skin:   Other vitiligo Neuro: Alert/Oriented, Flat, Cranial Nerve II-XII normal, Normal Sensory, Abnormal Motor 2-/5 in Left delt, bi, tri, grip, 4/5 L hip flex, knee ext, 2-/5 Bilateral ankle DF/PF limited by pain.  R side is 5/5 except ankle, Abnormal FMC Ataxic/ dec FMC and Dysarthric Musc/Skel:  Swelling Bilateral ankle joint swelling , Left toes 2-5 , pain with ROM and tenderness and Other Gen : NAD Mood/affect appropriate but flat -SLR  Assessment/Plan: 1. Functional deficits secondary to R lentiform nucleus ICH with Left HP which  require 3+ hours per day of interdisciplinary therapy in a comprehensive inpatient rehab setting. Physiatrist is providing close team supervision and 24 hour management of active medical problems listed below. Physiatrist  and rehab team continue to assess barriers to discharge/monitor patient progress toward functional and medical goals. FIM: FIM - Bathing Bathing Steps Patient Completed: Chest;Right Arm;Left Arm;Abdomen;Front perineal area;Buttocks;Right upper leg;Left upper leg;Right lower leg (including foot);Left lower leg (including foot) Bathing: 5: Supervision: Safety issues/verbal cues  FIM - Upper Body Dressing/Undressing Upper body dressing/undressing steps patient completed: Thread/unthread right sleeve of pullover shirt/dresss;Thread/unthread left sleeve of pullover shirt/dress;Put head through opening of pull over shirt/dress;Pull shirt over trunk Upper body dressing/undressing: 5: Supervision: Safety issues/verbal cues FIM - Lower Body Dressing/Undressing Lower body dressing/undressing steps patient completed: Thread/unthread right underwear leg;Thread/unthread left underwear leg Lower body dressing/undressing: 5: Supervision: Safety issues/verbal cues  FIM - Toileting Toileting steps completed by patient: Adjust clothing prior to toileting;Performs perineal hygiene;Adjust clothing after toileting Toileting Assistive Devices: Grab bar or rail for support Toileting: 5: Supervision: Safety issues/verbal cues  FIM - Diplomatic Services operational officer Devices: Grab bars;Cane Toilet Transfers: 5-To toilet/BSC: Supervision (verbal cues/safety issues);5-From toilet/BSC: Supervision (verbal cues/safety issues)  FIM - Banker Devices: Teacher, music: 5: Supine > Sit: Supervision (verbal cues/safety issues);5: Bed > Chair or W/C: Supervision (verbal cues/safety issues)  FIM - Locomotion: Wheelchair Distance: 250 Locomotion: Wheelchair: 0: Activity did not occur FIM - Locomotion: Ambulation Locomotion: Ambulation Assistive Devices: Emergency planning/management officer Ambulation/Gait Assistance: 6: Modified independent (Device/Increase time) Locomotion:  Ambulation: 2: Travels 50 - 149 ft with supervision/safety issues  Comprehension Comprehension Mode: Auditory Comprehension: 5-Understands complex 90% of the time/Cues < 10% of the time  Expression Expression Mode: Verbal Expression: 7-Expresses complex ideas: With no assist  Social Interaction Social Interaction: 6-Interacts appropriately with others with medication or extra time (anti-anxiety, antidepressant).  Problem Solving Problem Solving: 5-Solves basic 90% of the time/requires cueing < 10% of the time  Memory Memory: 6-More than reasonable amt of time Medical Problem List and Plan:  1. Right basal ganglia hemorrhage.  2. DVT Prophylaxis/Anticoagulation: SCDs. Monitor for any signs of DVT. Dopplers negative  3. Pain Management/headaches: Neurontin 100 mg twice a day, Ultram as needed. Monitor with increased mobility  4. Neuropsych: This patient is capable of making decisions on his/her own behalf.  5. Mood/depression. Sinequan 25 mg each bedtime, Cymbalta 60 mg daily. Provide emotional support and positive reinforcement  6. Hypertension. Hydrochlorothiazide 25 mg. Monitor with increased activity. Educate pt regarding secondary prevention of ICH/stroke 7.  Gout with acute flare, hold allopurinol, cont colchicine, add medrol burst and taper. Restart allopurinol when flare resolved, diet education 8.  Sciatic pain , difficult to do exam due to concommitant CVA with L HP, interfering with therapy,  MRI demostrating pars defects and anterolisthesis L5 on S1, L>R L4 nerve root impingement from foraminal stenosis, bilateral L5 impingement. Symptomatic at Left L4, increase neurontin consider ESI as outpt LOS (Days) 6 A FACE TO FACE EVALUATION WAS PERFORMED  Kahne Helfand E 05/26/2012, 8:41 AM

## 2012-05-26 NOTE — Progress Notes (Signed)
Occupational Therapy Discharge Summary  Patient Details  Name: Edward Weber MRN: [id number] Date of Birth: May 09, 1960  Today's Date: 05/26/2012  Patient has met 14 of 14 long term goals due to improved activity tolerance, improved balance, postural control, ability to compensate for deficits, functional use of  LEFT upper extremity, improved attention, improved awareness and improved coordination.  Patient to discharge at overall Modified Independent level.  Patient's care partner is independent to provide the necessary physical assistance at discharge.    Reasons goals not met: n/a  Recommendation: No further OT services recommended at this time.  Equipment: tub bench  Reasons for discharge: treatment goals met  Patient/family agrees with progress made and goals achieved: Yes  OT Discharge  ADL Mod independent to independent - uses tub bench in tub   Vision/Perception  Vision - Assessment Eye Alignment: Within Functional Limits Additional Comments: eye fatigue is improved, decreased blurriness Perception Perception: Within Functional Limits Inattention/Neglect: Appears intact Praxis Praxis: Intact  Cognition Overall Cognitive Status: Appears within functional limits for tasks assessed Sensation Sensation Stereognosis: Appears Intact Hot/Cold: Appears Intact Proprioception: Appears Intact Coordination Gross Motor Movements are Fluid and Coordinated: Yes Fine Motor Movements are Fluid and Coordinated: Yes Motor  Motor Motor: Hemiplegia Motor - Discharge Observations: LLE  weakness, premorbid abnormal trunk posture Mobility  Bed Mobility Supine to Sit: 7: Independent Mod I with cane Trunk/Postural Assessment  Cervical Assessment Cervical Assessment: Within Functional Limits Thoracic Assessment Thoracic Assessment: Within Functional Limits Lumbar Assessment Lumbar Assessment: Within Functional Limits Postural Control Postural Control: Within Functional Limits   Balance Static Standing Balance Static Standing - Level of Assistance: 7: Independent Dynamic Standing Balance Dynamic Standing - Level of Assistance: 6: Modified independent (Device/Increase time) Extremity/Trunk Assessment RUE Assessment RUE Assessment: Within Functional Limits LUE Assessment LUE Assessment: Within Functional Limits  See FIM for current functional status  Alvia Jablonski 05/26/2012, 3:49 PM

## 2012-05-26 NOTE — Progress Notes (Signed)
Physical Therapy Session Note  Patient Details  Name: Edward Weber MRN: [id number] Date of Birth: 07/20/1960  Today's Date: 05/26/2012 Time: 4098-1191 Time Calculation (min): 30 min  Short Term Goals: Week 1:  PT Short Term Goal 1 (Week 1): pt will perform functional transfers with min A PT Short Term Goal 2 (Week 1): pt will gait in controlled environment 100' with min A PT Short Term Goal 3 (Week 1): pt will demo dynamic standing balance for functional activity with min A  Therapy Documentation Precautions:  Precautions Precautions: Fall Precaution Comments: painful L ankle due to gout Restrictions Weight Bearing Restrictions: No Vital Signs: Therapy Vitals Temp: 97.8 F (36.6 C) Temp src: Oral Pulse Rate: 81 Resp: 18 BP: 119/81 mmHg Patient Position, if appropriate: Sitting Oxygen Therapy SpO2: 98 % O2 Device: None (Room air) Pain: Pain Assessment Pain Assessment: No/denies pain Mobility: Bed Mobility Supine to Sit: 7: Independent Supine to Sit Details (indicate cue type and reason): Supine <> sit on flat bed, no rails independent Transfers Stand Pivot Transfers: 6: Modified independent (Device/Increase time) Locomotion : Ambulation Ambulation/Gait Assistance: 6: Modified independent (Device/Increase time) Ambulation Distance (Feet): 150 Feet Assistive device: Straight cane Stairs / Additional Locomotion Stairs: Yes Stairs Assistance: 4: Min guard Stair Management Technique: No rails;Alternating pattern;Step to pattern;With cane Number of Stairs: 24 Height of Stairs: 8 Wheelchair Mobility Wheelchair Mobility: No   Balance: Standardized Balance Assessment Standardized Balance Assessment: Hospital doctor Berg Balance Test Sit to Stand: Able to stand without using hands and stabilize independently Standing Unsupported: Able to stand safely 2 minutes Sitting with Back Unsupported but Feet Supported on Floor or Stool: Able to sit safely and securely 2  minutes Stand to Sit: Sits safely with minimal use of hands Transfers: Able to transfer safely, minor use of hands Standing Unsupported with Eyes Closed: Able to stand 10 seconds safely Standing Ubsupported with Feet Together: Able to place feet together independently and stand for 1 minute with supervision From Standing, Reach Forward with Outstretched Arm: Can reach confidently >25 cm (10") From Standing Position, Pick up Object from Floor: Able to pick up shoe safely and easily From Standing Position, Turn to Look Behind Over each Shoulder: Looks behind from both sides and weight shifts well Turn 360 Degrees: Able to turn 360 degrees safely in 4 seconds or less Standing Unsupported, Alternately Place Feet on Step/Stool: Able to stand independently and safely and complete 8 steps in 20 seconds Standing Unsupported, One Foot in Front: Able to place foot tandem independently and hold 30 seconds Standing on One Leg: Able to lift leg independently and hold 5-10 seconds Total Score: 54 Static Sitting Balance Static Sitting - Level of Assistance: 7: Independent Dynamic Sitting Balance Dynamic Sitting - Level of Assistance: 7: Independent Static Standing Balance Static Standing - Balance Support: No upper extremity supported Static Standing - Level of Assistance: 7: Independent Dynamic Standing Balance Dynamic Standing - Balance Support: Right upper extremity supported Dynamic Standing - Level of Assistance: 6: Modified independent (Device/Increase time) Other Treatments:  Began higher level dynamic standing balance, ankle and hip strategy training with tandem walking x 25' x 2 and toe walking x 25' with supervision but patient began to report 5/10 pain in ankle with higher level gait; transitioned to higher level mat core exercises.    See FIM for current functional status  Therapy/Group: Individual Therapy  Edman Circle [facility] 05/26/2012, 8:45 AM

## 2012-05-26 NOTE — Discharge Summary (Signed)
  Discharge summary job # [id number]

## 2012-05-26 NOTE — Progress Notes (Addendum)
Physical Therapy Discharge Summary  Patient Details  Name: Edward Weber MRN: [id number] Date of Birth: 06-Apr-1961  Today's Date: 05/26/2012 Time: 9147-8295 and 6213-0865 Time Calculation (min): 17 min and 15 min  Patient has met 7 of 7 long term goals due to improved activity tolerance, improved balance, increased strength, decreased pain, ability to compensate for deficits, functional use of  left lower extremity, improved attention and improved awareness.  Patient to discharge at an ambulatory level Modified Independent.   Patient's care partner did not attend training, as pt is modified independent  Reasons goals not met: n/a  Recommendation:  Patient will benefit from ongoing skilled PT services in outpatient setting to continue to advance safe functional mobility, address ongoing impairments in high level balance; muscle:  power endurance, timing and sequencing; activity tolerance; and minimize fall risk.  Pt has a C curve scoliosis, L convexity, RLE appears longer.  He was aware of this problem, and reported he has had intermittent L sciatic- type pain x 10 years.  See recent MRI regarding L4-S1 pathology.  He would benefit from therapy to address this chronic problem in addition to his post-CVA deficits. In addition, pt has L ankle gout which has improved while at [facility], but he needs more education on managing this.    Equipment: No equipment provided; pt owned [facility]  Reasons for discharge: treatment goals met and discharge from hospital  Patient/family agrees with progress made and goals achieved: Yes  PT Discharge Precautions/Restrictions Precautions Precaution Comments: painful L ankle due to gout   Pain- 5/10 L ankle due to gout; premedicated   Vision/Perception- pt describes some blurriness of vision since CVA     Cognition Overall Cognitive Status: Appears within functional limits for tasks assessed Sensation Sensation Stereognosis: Appears Intact Hot/Cold: Appears  Intact Proprioception: Appears Intact Coordination Gross Motor Movements are Fluid and Coordinated: Yes Fine Motor Movements are Fluid and Coordinated: Yes Motor  Motor Motor: Hemiplegia Motor - Discharge Observations: LLE  weakness, premorbid abnormal trunk posture  Mobility Bed Mobility Supine to Sit: 7: Independent Transfers Stand Pivot Transfers: 6: Modified independent (Device/Increase time) Locomotion  Ambulation Ambulation/Gait Assistance: 6: Modified independent (Device/Increase time) Assistive device: Straight cane Stairs / Additional Locomotion Stairs: Yes Stairs Assistance: 6: Modified independent (Device/Increase time) Stair Management Technique: No rails;With cane;Alternating pattern (also can ascend/descend 5 steps with 1 rail, modified indep.) Number of Stairs: 5 Height of Stairs: 7 Wheelchair Mobility Wheelchair Mobility: No  Trunk/Postural Assessment  Cervical Assessment Cervical Assessment: Within Functional Limits Thoracic Assessment Thoracic Assessment: Within Functional Limits Lumbar Assessment Lumbar Assessment: Within Functional Limits Postural Control Postural Control: Within Functional Limits  Balance Static Standing Balance Static Standing - Level of Assistance: 7: Independent Dynamic Standing Balance Dynamic Standing - Level of Assistance: 6: Modified independent (Device/Increase time) Extremity Assessment  RUE Assessment RUE Assessment: Within Functional Limits LUE Assessment LUE Assessment: Within Functional Limits      See FIM for current functional status  Treatment today:  AM- neuromuscular re-education via forced use, VCs, tactile cues for -LLE and trunk power, timing and sequencing -high level balance     Gait up/down 5 steps without rails, leaning against rail (he can lean against a wall or place R hand on wall as he ascends, modified independent. Gait in room and on unit modified independent using SPC.  PM- neuromuscular  re-education via forced use, demo, VCs for: - high level gait/balance without SPC: braiding, alternating hand to knee taps, alternating hand to foot taps -backwards  walking with head turns -gait in carpet without SPC with head turns -gait with head turns, quick stops -mini squats, calf raises while reaching with L or R hand overhead   [name] 05/26/2012, 2:27 PM

## 2012-05-27 MED ORDER — GABAPENTIN 300 MG PO CAPS: 300.0000 mg | ORAL_CAPSULE | Freq: Two times a day (BID) | ORAL | Status: DC

## 2012-05-27 MED ORDER — HYDROCHLOROTHIAZIDE 25 MG PO TABS: 25.0000 mg | ORAL_TABLET | Freq: Every day | ORAL | Status: DC

## 2012-05-27 MED ORDER — SUCRALFATE 1 G PO TABS: 1.0000 g | ORAL_TABLET | Freq: Three times a day (TID) | ORAL | Status: DC

## 2012-05-27 MED ORDER — DOXEPIN HCL 25 MG PO CAPS: 25.0000 mg | ORAL_CAPSULE | Freq: Every day | ORAL | Status: DC

## 2012-05-27 MED ORDER — COLCHICINE 0.6 MG PO TABS: 0.6000 mg | ORAL_TABLET | Freq: Two times a day (BID) | ORAL | Status: DC

## 2012-05-27 MED ORDER — SENNOSIDES-DOCUSATE SODIUM 8.6-50 MG PO TABS: 1.0000 | ORAL_TABLET | Freq: Two times a day (BID) | ORAL | Status: DC

## 2012-05-27 MED ORDER — DULOXETINE HCL 60 MG PO CPEP: 60.0000 mg | ORAL_CAPSULE | Freq: Every day | ORAL | Status: DC

## 2012-05-27 MED ORDER — METHYLPREDNISOLONE 4 MG PO KIT: PACK | ORAL | Status: DC

## 2012-05-27 MED ORDER — HYDROCODONE-ACETAMINOPHEN 5-325 MG PO TABS: 1.0000 | ORAL_TABLET | Freq: Four times a day (QID) | ORAL | Status: DC | PRN

## 2012-05-27 NOTE — Progress Notes (Signed)
Patient discharged with family. Walked down with belongings and cane.

## 2012-05-27 NOTE — Progress Notes (Signed)
Patient ID: Edward Weber, male   DOB: May 16, 1960, 52 y.o.   MRN: [id number]  Subjective/Complaints: 52 y.o. right-handed male with history of hypertension. Patient was independent prior to admission. Admitted 05/17/2012 a left-sided weakness and headache from [facility] emergency department. Initial blood pressure 163/99 received labetalol and hydralazine. MRI of the brain showed acute right lentiform nuclei hemorrhage 34 x 24 x 23 mm with surrounding edema. MRA of the head with atherosclerotic type changes. Echocardiogram with ejection fraction 45% grade 1 diastolic dysfunction . Carotid Dopplers with no significant ICA stenosis. Followup cranial CT scan 05/19/2012 stable appearance. Neurology services followup patient did not receive TPA secondary to hemorrhage. TEE completed 05/20/2012 positive PFO and venous Dopplers lower extremities completed that were negative.  LLE pain improved with neurontin, foot and ankle pain improved as well  Review of Systems  Musculoskeletal: Positive for joint pain.  Psychiatric/Behavioral: The patient has insomnia.        Insomnia due to foot/ankle pain  All other systems reviewed and are negative.    Objective: Vital Signs: Blood pressure 126/84, pulse 71, temperature 97.9 F (36.6 C), temperature source Oral, resp. rate 20, height 5' 8.5" (1.74 m), weight 83.915 kg (185 lb), SpO2 98.00%. [name]  05/25/2012  *RADIOLOGY REPORT*  Clinical Data: Low back and left leg pain.  MRI LUMBAR SPINE WITHOUT CONTRAST  Technique:  Multiplanar and multiecho pulse sequences of the lumbar spine were obtained without intravenous contrast.  Comparison: None.  Findings: The patient has chronic bilateral L5 pars interarticularis defects with 1.2 cm of anterolisthesis of L5 on S1.  Alignment is otherwise unremarkable.  Marrow signal is mildly heterogeneous.  Intense degenerative endplate signal change is present about the L5-S1 level.  The conus medullaris  is normal in signal and position.  Imaged intra-abdominal contents are unremarkable.  The T11-12 to L3-4 levels are negative.  L4-5:  Mild disc bulge with ligamentum flavum thickening and facet arthropathy.  The central canal is open.  Moderate to moderately severe foraminal narrowing appears worse on the left.  L5-S1:  The disc is uncovered with a minimal bulge.  The foramina are severely narrowed bilaterally due to anterolisthesis.  The exiting L5 roots are flattened within the foramina.  The central canal appears mildly narrowed.  S1-2:  Negative.  IMPRESSION:  1.  Bilateral L5 pars interarticularis defects with 1.2 cm of anterolisthesis of L5 on S1.  Foramen are severely narrowed and the exiting L5 roots are flattened within the foramina. 2.  Mild disc bulge and facet arthropathy at L4-5 results in moderate to moderately severe foraminal narrowing, worse on the left.  Central canal is open.   Original Report Authenticated By: Edward Weber, M.D.    No results found for this or any previous visit (from the past 72 hour(s)).   HEENT: normal Cardio: RRR Resp: CTA B/L GI: BS positive and NT, no masses Extremity:  Edema Left ankle edema and erythema Skin:   Other vitiligo Neuro: Alert/Oriented, Flat, Cranial Nerve II-XII normal, Normal Sensory, Abnormal Motor 2-/5 in Left delt, bi, tri, grip, 4/5 L hip flex, knee ext, 2-/5 Bilateral ankle DF/PF limited by pain.  R side is 5/5 except ankle, Abnormal FMC Ataxic/ dec FMC and Dysarthric Musc/Skel:  Ankles without erythema or tenderness  Gen : NAD Mood/affect appropriate but flat -SLR  Assessment/Plan: 1. Functional deficits secondary to R lentiform nucleus ICH with Left HP  Stable for D/C today F/u PCP in 1-2 weeks F/u PM&R  3 weeks See D/C summary See D/C instructionsFIM: FIM - Bathing Bathing Steps Patient Completed: Chest;Right Arm;Left Arm;Abdomen;Front perineal area;Buttocks;Right upper leg;Left upper leg;Right lower leg (including foot);Left  lower leg (including foot) Bathing: 6: Assistive device (Comment)  FIM - Upper Body Dressing/Undressing Upper body dressing/undressing steps patient completed: Thread/unthread right sleeve of pullover shirt/dresss;Thread/unthread left sleeve of pullover shirt/dress;Put head through opening of pull over shirt/dress;Pull shirt over trunk Upper body dressing/undressing: 7: Complete Independence: No helper FIM - Lower Body Dressing/Undressing Lower body dressing/undressing steps patient completed: Thread/unthread right underwear leg;Thread/unthread left underwear leg Lower body dressing/undressing: 7: Complete Independence: No helper  FIM - Toileting Toileting steps completed by patient: Adjust clothing prior to toileting;Performs perineal hygiene;Adjust clothing after toileting Toileting Assistive Devices: Grab bar or rail for support Toileting: 7: Independent: No helper, no device  FIM - Diplomatic Services operational officer Devices: Occupational hygienist Transfers: 6-Assistive device: No helper  FIM - Banker Devices: Teacher, music: 7: Supine > Sit: No assist;7: Sit > Supine: No assist;6: Bed > Chair or W/C: No assist;6: Chair or W/C > Bed: No assist  FIM - Locomotion: Wheelchair Distance: 250 Locomotion: Wheelchair: 0: Activity did not occur FIM - Locomotion: Ambulation Locomotion: Ambulation Assistive Devices: Emergency planning/management officer Ambulation/Gait Assistance: 6: Modified independent (Device/Increase time) Locomotion: Ambulation: 6: Travels 150 ft or more with assistive device/no helper  Comprehension Comprehension Mode: Auditory Comprehension: 7-Follows complex conversation/direction: With no assist  Expression Expression Mode: Verbal Expression: 7-Expresses complex ideas: With no assist  Social Interaction Social Interaction: 6-Interacts appropriately with others with medication or extra time (anti-anxiety, antidepressant).  Problem  Solving Problem Solving: 6-Solves complex problems: With extra time  Memory Memory: 6-More than reasonable amt of time Medical Problem List and Plan:  1. Right basal ganglia hemorrhage.  2. DVT Prophylaxis/Anticoagulation: SCDs. Monitor for any signs of DVT. Dopplers negative  3. Pain Management/headaches: Neurontin 100 mg twice a day, Ultram as needed. Monitor with increased mobility  4. Neuropsych: This patient is capable of making decisions on his/her own behalf.  5. Mood/depression. Sinequan 25 mg each bedtime, Cymbalta 60 mg daily. Provide emotional support and positive reinforcement  6. Hypertension. Hydrochlorothiazide 25 mg. Monitor with increased activity. Educate pt regarding secondary prevention of ICH/stroke 7.  Gout with acute flare, hold allopurinol, cont colchicine, cont medrol burst and taper. Restart allopurinol when flare resolved, and off medroldiet education 8.  Sciatic pain , difficult to do exam due to concommitant CVA with L HP, interfering with therapy,  MRI demostrating pars defects and anterolisthesis L5 on S1, L>R L4 nerve root impingement from foraminal stenosis, bilateral L5 impingement. Symptomatic at Left L4, increase neurontin consider ESI as outpt LOS (Days) 7 A FACE TO FACE EVALUATION WAS PERFORMED  Anilah Huck E 05/27/2012, 8:18 AM
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
## 2012-06-03 DIAGNOSIS — Z8673 Personal history of transient ischemic attack (TIA), and cerebral infarction without residual deficits: Secondary | ICD-10-CM

## 2012-06-16 ENCOUNTER — Encounter: Payer: Self-pay | Admitting: Internal Medicine

## 2012-06-17 ENCOUNTER — Inpatient Hospital Stay: Payer: Medicare Other | Admitting: Physical Medicine & Rehabilitation

## 2012-07-04 ENCOUNTER — Ambulatory Visit (HOSPITAL_BASED_OUTPATIENT_CLINIC_OR_DEPARTMENT_OTHER): Payer: Medicare Other | Admitting: Physical Medicine & Rehabilitation

## 2012-07-04 ENCOUNTER — Encounter: Payer: Medicare Other | Attending: Physical Medicine & Rehabilitation

## 2012-07-04 ENCOUNTER — Encounter: Payer: Self-pay | Admitting: Physical Medicine & Rehabilitation

## 2012-07-04 VITALS — BP 141/83 | HR 96 | Resp 14 | Ht 68.5 in | Wt 183.0 lb

## 2012-07-04 DIAGNOSIS — F3289 Other specified depressive episodes: Secondary | ICD-10-CM | POA: Insufficient documentation

## 2012-07-04 DIAGNOSIS — M549 Dorsalgia, unspecified: Secondary | ICD-10-CM | POA: Insufficient documentation

## 2012-07-04 DIAGNOSIS — F329 Major depressive disorder, single episode, unspecified: Secondary | ICD-10-CM | POA: Insufficient documentation

## 2012-07-04 DIAGNOSIS — G8190 Hemiplegia, unspecified affecting unspecified side: Secondary | ICD-10-CM

## 2012-07-04 DIAGNOSIS — I69959 Hemiplegia and hemiparesis following unspecified cerebrovascular disease affecting unspecified side: Secondary | ICD-10-CM | POA: Insufficient documentation

## 2012-07-04 DIAGNOSIS — I10 Essential (primary) hypertension: Secondary | ICD-10-CM | POA: Insufficient documentation

## 2012-07-04 NOTE — Patient Instructions (Signed)
Please ask the physical therapist to work with your back as well. I think you have lumbar spondylosis and may need a flexion-based therapy program. If you are not much better after one more month of physical therapy which her back. I'll do some x-rays and we may want to try some back injections. You can try Tylenol one or 2 tablets 3 or 4 times per day for pain. You may use heat for your low back .Because of your recent stroke I would avoid ibuprofen or Aleve

## 2012-07-04 NOTE — Progress Notes (Signed)
Subjective:    Patient ID: Edward Weber, male    DOB: 1960-06-23, 52 y.o.   MRN: [id number] 52 year old right-handed male  with history of hypertension, independent prior to admission who was  admitted May 17, 2012, with left-sided weakness and headache.  Initial blood pressure 163/99, received labetalol. MRI of the brain  showed acute right lentiform hemorrhage 34 x 24 x 23 mm with surrounding  edema. MRA of the head with atherosclerotic type changes.  Echocardiogram with ejection fraction 45% grade 1 diastolic dysfunction.  Carotid Dopplers with no ICA stenosis. Followup cranial CT scan  May 19, 2012, stable. Neurology Services consulted. The patient  did not receive tPA secondary to hemorrhage. A TEE was completed  May 20, 2012, showing positive PFO, venous Doppler studies lower  extremities completed that were negative.  First followup after CIR admission DATE OF ADMISSION: 05/20/2012  DATE OF DISCHARGE: 05/27/2012  HPI Has been going to the outpatient therapy on Tuesdays and Thursdays at [facility] Has seen primary care physician. Blood pressure medication dosage was increased to 50 mg.  Complains of back pain mainly with walking and standing. Has had a prior history of back pain. No history of back surgery. Has not tried any physical therapy. Has tried with pain medication. No history of back injections.   Pain Inventory Average Pain 6 Pain Right Now 6 My pain is aching  In the last 24 hours, has pain interfered with the following? General activity 4 Relation with others 4 Enjoyment of life 7 What TIME of day is your pain at its worst? evening Sleep (in general) Poor  Pain is worse with: some activites Pain improves with: medication Relief from Meds: 5  Mobility walk without assistance use a cane how many minutes can you walk? 20 ability to climb steps?  yes do you drive?  no  Function disabled: date disabled see chart I need  assistance with the following:  household duties  Neuro/Psych weakness tingling loss of taste or smell  Prior Studies Any changes since last visit?  no  Physicians involved in your care Any changes since last visit?  no   History reviewed. No pertinent family history. History   Social History  . Marital Status: Single    Spouse Name: N/A    Number of Children: N/A  . Years of Education: N/A   Social History Main Topics  . Smoking status: Former Games developer  . Smokeless tobacco: Never Used  . Alcohol Use: 5.4 oz/week    6 Cans of beer, 3 Shots of liquor per week     Comment: only on the weekends  . Drug Use: Yes    Special: Marijuana     Comment: no longer smokes marijuana  . Sexually Active: None   Other Topics Concern  . None   Social History Narrative  . None   Past Surgical History  Procedure Laterality Date  . Tee without cardioversion N/A 05/20/2012    Procedure: TRANSESOPHAGEAL ECHOCARDIOGRAM (TEE);  Surgeon: Pricilla Riffle, MD;  Location: [facility] ENDOSCOPY;  Service: Cardiovascular;  Laterality: N/A;   Past Medical History  Diagnosis Date  . Hypertension   . GERD (gastroesophageal reflux disease)   . Depression   . Hepatitis   . Stroke    BP 141/83  Pulse 96  Resp 14  Ht 5' 8.5" (1.74 m)  Wt 183 lb (83.008 kg)  BMI 27.42 kg/m2  SpO2 95%    Review of Systems  Constitutional: Positive for  appetite change.       Loss of taste or smell  Gastrointestinal: Positive for constipation.  Musculoskeletal: Positive for back pain.  Neurological: Positive for weakness.       Tingling  All other systems reviewed and are negative.       Objective:   Physical Exam  Nursing note and vitals reviewed. Constitutional: He is oriented to person, place, and time. He appears well-nourished.  HENT:  Head: Normocephalic and atraumatic.  Eyes: Conjunctivae and EOM are normal.  Neck: Normal range of motion.  Musculoskeletal:       Lumbar back: He exhibits decreased  range of motion and tenderness. He exhibits no deformity.  Limited lumbar extension. Pain with end range flexion  Neurological: He is alert and oriented to person, place, and time. No cranial nerve deficit or sensory deficit. He exhibits normal muscle tone. Coordination and gait abnormal.  Stenotic gait Left ankle dorsiflexor strength 3 minus Left deltoid, biceps, triceps, grip strength 4/5 Remainder of muscle groups are 5/5 Reduced left hand fine motor coordination   Psychiatric: He has a normal mood and affect.          Assessment & Plan:   1. Right basal ganglia hemorrhage.  2.Left hemiparesis                                                   3. Pain management.Lumbar pain probable spondylosis Tylenol Primary care for 4. Depression.  5. Hypertension.

## 2012-07-12 ENCOUNTER — Encounter: Payer: Self-pay | Admitting: Internal Medicine

## 2012-08-02 ENCOUNTER — Encounter: Payer: Medicare Other | Attending: Physical Medicine & Rehabilitation

## 2012-08-02 ENCOUNTER — Ambulatory Visit: Payer: Medicare Other | Admitting: Physical Medicine & Rehabilitation

## 2012-09-08 ENCOUNTER — Ambulatory Visit: Payer: Medicare Other | Admitting: Physical Medicine & Rehabilitation

## 2012-09-08 ENCOUNTER — Ambulatory Visit: Payer: Self-pay | Admitting: Specialist

## 2012-09-16 ENCOUNTER — Ambulatory Visit: Payer: Medicare Other | Admitting: Physical Medicine & Rehabilitation

## 2012-09-16 ENCOUNTER — Encounter: Payer: Medicare Other | Attending: Physical Medicine & Rehabilitation

## 2012-09-16 DIAGNOSIS — F3289 Other specified depressive episodes: Secondary | ICD-10-CM | POA: Insufficient documentation

## 2012-09-16 DIAGNOSIS — M549 Dorsalgia, unspecified: Secondary | ICD-10-CM | POA: Insufficient documentation

## 2012-09-16 DIAGNOSIS — I10 Essential (primary) hypertension: Secondary | ICD-10-CM | POA: Insufficient documentation

## 2012-09-16 DIAGNOSIS — F329 Major depressive disorder, single episode, unspecified: Secondary | ICD-10-CM | POA: Insufficient documentation

## 2012-09-16 DIAGNOSIS — I69959 Hemiplegia and hemiparesis following unspecified cerebrovascular disease affecting unspecified side: Secondary | ICD-10-CM | POA: Insufficient documentation

## 2012-10-06 ENCOUNTER — Encounter: Payer: Self-pay | Admitting: Internal Medicine

## 2013-05-01 ENCOUNTER — Inpatient Hospital Stay (HOSPITAL_COMMUNITY)
Admission: EM | Admit: 2013-05-01 | Discharge: 2013-05-03 | DRG: 090 | Disposition: A | Discharge status: Home Health | Payer: Medicare Other | Source: Other Acute Inpatient Hospital | Attending: General Surgery | Admitting: General Surgery

## 2013-05-01 ENCOUNTER — Encounter (HOSPITAL_COMMUNITY): Payer: Self-pay | Admitting: *Deleted

## 2013-05-01 ENCOUNTER — Emergency Department: Payer: Self-pay | Admitting: Emergency Medicine

## 2013-05-01 DIAGNOSIS — H1130 Conjunctival hemorrhage, unspecified eye: Secondary | ICD-10-CM | POA: Diagnosis present

## 2013-05-01 DIAGNOSIS — S060XAA Concussion with loss of consciousness status unknown, initial encounter: Secondary | ICD-10-CM

## 2013-05-01 DIAGNOSIS — K759 Inflammatory liver disease, unspecified: Secondary | ICD-10-CM | POA: Insufficient documentation

## 2013-05-01 DIAGNOSIS — F1210 Cannabis abuse, uncomplicated: Secondary | ICD-10-CM | POA: Diagnosis present

## 2013-05-01 DIAGNOSIS — S02400A Malar fracture unspecified, initial encounter for closed fracture: Secondary | ICD-10-CM | POA: Diagnosis present

## 2013-05-01 DIAGNOSIS — F32A Depression, unspecified: Secondary | ICD-10-CM | POA: Insufficient documentation

## 2013-05-01 DIAGNOSIS — S02401A Maxillary fracture, unspecified, initial encounter for closed fracture: Secondary | ICD-10-CM

## 2013-05-01 DIAGNOSIS — F1010 Alcohol abuse, uncomplicated: Secondary | ICD-10-CM | POA: Diagnosis present

## 2013-05-01 DIAGNOSIS — S0285XA Fracture of orbit, unspecified, initial encounter for closed fracture: Secondary | ICD-10-CM | POA: Diagnosis present

## 2013-05-01 DIAGNOSIS — S02402A Zygomatic fracture, unspecified, initial encounter for closed fracture: Secondary | ICD-10-CM | POA: Diagnosis present

## 2013-05-01 DIAGNOSIS — Z79899 Other long term (current) drug therapy: Secondary | ICD-10-CM

## 2013-05-01 DIAGNOSIS — S0230XA Fracture of orbital floor, unspecified side, initial encounter for closed fracture: Secondary | ICD-10-CM | POA: Diagnosis present

## 2013-05-01 DIAGNOSIS — F329 Major depressive disorder, single episode, unspecified: Secondary | ICD-10-CM | POA: Insufficient documentation

## 2013-05-01 DIAGNOSIS — S060X9A Concussion with loss of consciousness of unspecified duration, initial encounter: Secondary | ICD-10-CM

## 2013-05-01 DIAGNOSIS — Z8673 Personal history of transient ischemic attack (TIA), and cerebral infarction without residual deficits: Secondary | ICD-10-CM

## 2013-05-01 DIAGNOSIS — I10 Essential (primary) hypertension: Secondary | ICD-10-CM | POA: Diagnosis present

## 2013-05-01 DIAGNOSIS — S0280XA Fracture of other specified skull and facial bones, unspecified side, initial encounter for closed fracture: Secondary | ICD-10-CM

## 2013-05-01 DIAGNOSIS — Z91011 Allergy to milk products: Secondary | ICD-10-CM

## 2013-05-01 DIAGNOSIS — K219 Gastro-esophageal reflux disease without esophagitis: Secondary | ICD-10-CM | POA: Insufficient documentation

## 2013-05-01 DIAGNOSIS — Z87891 Personal history of nicotine dependence: Secondary | ICD-10-CM

## 2013-05-01 DIAGNOSIS — F3289 Other specified depressive episodes: Secondary | ICD-10-CM | POA: Diagnosis present

## 2013-05-01 LAB — ACETAMINOPHEN LEVEL: Acetaminophen: <2 ug/mL

## 2013-05-01 LAB — COMPREHENSIVE METABOLIC PANEL
ALBUMIN: 3.5 g/dL (ref 3.4–5.0)
ANION GAP: 11 (ref 7–16)
Alkaline Phosphatase: 108 U/L
BUN: 10 mg/dL (ref 7–18)
Bilirubin,Total: 0.6 mg/dL (ref 0.2–1.0)
CALCIUM: 9.1 mg/dL (ref 8.5–10.1)
CHLORIDE: 106 mmol/L (ref 98–107)
CO2: 22 mmol/L (ref 21–32)
CREATININE: 1.18 mg/dL (ref 0.60–1.30)
EGFR (Non-African Amer.): >60
GLUCOSE: 90 mg/dL (ref 65–99)
Osmolality: 276 (ref 275–301)
Potassium: 3.2 mmol/L — ABNORMAL LOW (ref 3.5–5.1)
SGOT(AST): 246 U/L — ABNORMAL HIGH (ref 15–37)
SGPT (ALT): 114 U/L — ABNORMAL HIGH (ref 12–78)
Sodium: 139 mmol/L (ref 136–145)
Total Protein: 6.9 g/dL (ref 6.4–8.2)

## 2013-05-01 LAB — CBC
HCT: 34.7 % — ABNORMAL LOW (ref 40.0–52.0)
HGB: 11.9 g/dL — AB (ref 13.0–18.0)
MCH: 34.6 pg — ABNORMAL HIGH (ref 26.0–34.0)
MCHC: 34.4 g/dL (ref 32.0–36.0)
MCV: 101 fL — AB (ref 80–100)
PLATELETS: 119 10*3/uL — AB (ref 150–440)
RBC: 3.44 10*6/uL — ABNORMAL LOW (ref 4.40–5.90)
RDW: 14.6 % — ABNORMAL HIGH (ref 11.5–14.5)
WBC: 5.4 10*3/uL (ref 3.8–10.6)

## 2013-05-01 LAB — ETHANOL
Ethanol %: 0.221 % — ABNORMAL HIGH (ref 0.000–0.080)
Ethanol: 221 mg/dL

## 2013-05-01 LAB — SALICYLATE LEVEL

## 2013-05-01 MED ORDER — HYDROXYZINE HCL 25 MG PO TABS: 25.0000 mg | ORAL_TABLET | Freq: Three times a day (TID) | ORAL | Status: DC | PRN

## 2013-05-01 MED ORDER — FOLIC ACID 1 MG PO TABS
1.0000 mg | ORAL_TABLET | Freq: Every day | ORAL | Status: DC
  Administered 2013-05-01 – 2013-05-03 (×3): 1 mg via ORAL
  Filled 2013-05-01 (×3): qty 1

## 2013-05-01 MED ORDER — THIAMINE HCL 100 MG/ML IJ SOLN
100.0000 mg | Freq: Every day | INTRAMUSCULAR | Status: DC
  Filled 2013-05-01 (×2): qty 1

## 2013-05-01 MED ORDER — PANTOPRAZOLE SODIUM 40 MG IV SOLR
40.0000 mg | Freq: Every day | INTRAVENOUS | Status: DC
  Filled 2013-05-01 (×2): qty 40

## 2013-05-01 MED ORDER — ONDANSETRON HCL 4 MG/2ML IJ SOLN: 4.0000 mg | Freq: Four times a day (QID) | INTRAMUSCULAR | Status: DC | PRN

## 2013-05-01 MED ORDER — OXYCODONE HCL 5 MG PO TABS
10.0000 mg | ORAL_TABLET | ORAL | Status: DC | PRN
  Administered 2013-05-01 – 2013-05-03 (×4): 10 mg via ORAL
  Filled 2013-05-01 (×4): qty 2

## 2013-05-01 MED ORDER — SUCRALFATE 1 G PO TABS
1.0000 g | ORAL_TABLET | Freq: Three times a day (TID) | ORAL | Status: DC
  Administered 2013-05-01 – 2013-05-03 (×10): 1 g via ORAL
  Filled 2013-05-01 (×14): qty 1

## 2013-05-01 MED ORDER — SENNOSIDES-DOCUSATE SODIUM 8.6-50 MG PO TABS
1.0000 | ORAL_TABLET | Freq: Two times a day (BID) | ORAL | Status: DC
Start: 2013-05-01 — End: 2013-05-03
  Administered 2013-05-01 – 2013-05-03 (×5): 1 via ORAL
  Filled 2013-05-01 (×5): qty 1

## 2013-05-01 MED ORDER — HYDROCHLOROTHIAZIDE 25 MG PO TABS
25.0000 mg | ORAL_TABLET | Freq: Every day | ORAL | Status: DC
  Administered 2013-05-01 – 2013-05-03 (×3): 25 mg via ORAL
  Filled 2013-05-01 (×5): qty 1

## 2013-05-01 MED ORDER — COLCHICINE 0.6 MG PO TABS
0.6000 mg | ORAL_TABLET | Freq: Two times a day (BID) | ORAL | Status: DC
  Administered 2013-05-01 – 2013-05-03 (×5): 0.6 mg via ORAL
  Filled 2013-05-01 (×8): qty 1

## 2013-05-01 MED ORDER — MORPHINE SULFATE 2 MG/ML IJ SOLN
2.0000 mg | INTRAMUSCULAR | Status: DC | PRN
  Administered 2013-05-01: 2 mg via INTRAVENOUS
  Filled 2013-05-01: qty 1

## 2013-05-01 MED ORDER — GABAPENTIN 300 MG PO CAPS
300.0000 mg | ORAL_CAPSULE | Freq: Two times a day (BID) | ORAL | Status: DC
  Administered 2013-05-01 – 2013-05-03 (×5): 300 mg via ORAL
  Filled 2013-05-01 (×8): qty 1

## 2013-05-01 MED ORDER — DULOXETINE HCL 60 MG PO CPEP
60.0000 mg | ORAL_CAPSULE | Freq: Every day | ORAL | Status: DC
  Administered 2013-05-01 – 2013-05-03 (×3): 60 mg via ORAL
  Filled 2013-05-01 (×3): qty 1

## 2013-05-01 MED ORDER — DEXTROSE IN LACTATED RINGERS 5 % IV SOLN
INTRAVENOUS | Status: DC
  Administered 2013-05-01 – 2013-05-02 (×4): via INTRAVENOUS

## 2013-05-01 MED ORDER — DOXEPIN HCL 25 MG PO CAPS
25.0000 mg | ORAL_CAPSULE | Freq: Every day | ORAL | Status: DC
  Administered 2013-05-01 – 2013-05-02 (×2): 25 mg via ORAL
  Filled 2013-05-01 (×3): qty 1

## 2013-05-01 MED ORDER — ONDANSETRON HCL 4 MG PO TABS: 4.0000 mg | ORAL_TABLET | Freq: Four times a day (QID) | ORAL | Status: DC | PRN

## 2013-05-01 MED ORDER — ENALAPRIL MALEATE 5 MG PO TABS
5.0000 mg | ORAL_TABLET | Freq: Every day | ORAL | Status: DC
  Administered 2013-05-01 – 2013-05-03 (×3): 5 mg via ORAL
  Filled 2013-05-01 (×3): qty 1

## 2013-05-01 MED ORDER — ACETAMINOPHEN 325 MG PO TABS
650.0000 mg | ORAL_TABLET | ORAL | Status: DC | PRN
Start: 2013-05-01 — End: 2013-05-03
  Administered 2013-05-03: 650 mg via ORAL
  Filled 2013-05-01: qty 2

## 2013-05-01 MED ORDER — LORAZEPAM 2 MG/ML IJ SOLN
1.0000 mg | Freq: Four times a day (QID) | INTRAMUSCULAR | Status: DC | PRN
  Administered 2013-05-01: 1 mg via INTRAVENOUS
  Filled 2013-05-01: qty 1

## 2013-05-01 MED ORDER — PANTOPRAZOLE SODIUM 40 MG PO TBEC
40.0000 mg | DELAYED_RELEASE_TABLET | Freq: Every day | ORAL | Status: DC
  Administered 2013-05-01 – 2013-05-03 (×3): 40 mg via ORAL
  Filled 2013-05-01 (×3): qty 1

## 2013-05-01 MED ORDER — LORAZEPAM 1 MG PO TABS: 1.0000 mg | ORAL_TABLET | Freq: Four times a day (QID) | ORAL | Status: DC | PRN

## 2013-05-01 MED ORDER — VITAMIN B-1 100 MG PO TABS
100.0000 mg | ORAL_TABLET | Freq: Every day | ORAL | Status: DC
  Administered 2013-05-01 – 2013-05-03 (×3): 100 mg via ORAL
  Filled 2013-05-01 (×3): qty 1

## 2013-05-01 MED ORDER — OXYCODONE HCL 5 MG PO TABS
5.0000 mg | ORAL_TABLET | ORAL | Status: DC | PRN
  Administered 2013-05-01 – 2013-05-03 (×3): 5 mg via ORAL
  Filled 2013-05-01 (×3): qty 1

## 2013-05-01 MED ORDER — ADULT MULTIVITAMIN W/MINERALS CH
1.0000 | ORAL_TABLET | Freq: Every day | ORAL | Status: DC
  Administered 2013-05-01 – 2013-05-03 (×3): 1 via ORAL
  Filled 2013-05-01 (×3): qty 1

## 2013-05-01 MED ORDER — ENOXAPARIN SODIUM 40 MG/0.4ML ~~LOC~~ SOLN
40.0000 mg | SUBCUTANEOUS | Status: DC
  Administered 2013-05-01 – 2013-05-02 (×2): 40 mg via SUBCUTANEOUS
  Filled 2013-05-01 (×3): qty 0.4
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
## 2013-05-02 LAB — BASIC METABOLIC PANEL
BUN: 7 mg/dL (ref 6–23)
CO2: 27 mEq/L (ref 19–32)
Calcium: 8.5 mg/dL (ref 8.4–10.5)
Chloride: 100 mEq/L (ref 96–112)
Creatinine, Ser: 0.8 mg/dL (ref 0.50–1.35)
Glucose, Bld: 132 mg/dL — ABNORMAL HIGH (ref 70–99)
POTASSIUM: 4 meq/L (ref 3.7–5.3)
Sodium: 140 mEq/L (ref 137–147)

## 2013-05-02 LAB — CBC
HCT: 34.1 % — ABNORMAL LOW (ref 39.0–52.0)
Hemoglobin: 12 g/dL — ABNORMAL LOW (ref 13.0–17.0)
MCH: 34.1 pg — ABNORMAL HIGH (ref 26.0–34.0)
MCHC: 35.2 g/dL (ref 30.0–36.0)
MCV: 96.9 fL (ref 78.0–100.0)
PLATELETS: 117 10*3/uL — AB (ref 150–400)
RBC: 3.52 MIL/uL — ABNORMAL LOW (ref 4.22–5.81)
RDW: 13.7 % (ref 11.5–15.5)
WBC: 5.4 10*3/uL (ref 4.0–10.5)

## 2013-05-02 NOTE — Progress Notes (Signed)
UR completed. Patient changed to inpatient r/t con't to require IVF @ 125cc/hr
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
## 2013-05-02 NOTE — Progress Notes (Signed)
OT NOTE  RN staff please help ambulate patient several times during shift. Pt with pending d/c home 05/03/13 and balance deficits from prolonged bedrest. Pt is to use RW and navigate to room without cues from staff (help with his memory)  Thank you  Jeri Modena   OTR/L Pager: [phone] Office: [phone] .

## 2013-05-02 NOTE — Progress Notes (Signed)
Bad HA. Up with OT now, has some unsteadiness. Will see how the eval is. He does have a friend to stay with him at home 24/7. Appreciate ENT and optho eval. Patient examined and I agree with the assessment and plan  Georganna Skeans, MD, MPH, FACS Pager: [phone]  05/02/2013 10:59 AM

## 2013-05-03 MED ORDER — OXYCODONE-ACETAMINOPHEN 5-325 MG PO TABS: 1.0000 | ORAL_TABLET | ORAL | Status: DC | PRN
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
## 2013-05-03 NOTE — Discharge Summary (Signed)
I have seen and examined the pt and agree with PA-Jeffery's progress note.
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
## 2013-05-03 NOTE — Progress Notes (Signed)
I have seen and examined the pt and agree with PA-Jeffery's progress note.

## 2013-05-03 NOTE — Progress Notes (Signed)
Clinical Social Work Department BRIEF PSYCHOSOCIAL ASSESSMENT 05/03/2013  Patient:  Edward Weber, Edward Weber     Account Number:  [id number]     Admit date:  05/01/2013  Clinical Social Worker:  Adair Laundry  Date/Time:  05/03/2013 10:00 AM  Referred by:  Physician  Date Referred:  05/03/2013 Referred for  Substance Abuse   Other Referral:   Interview type:  Patient Other interview type:    PSYCHOSOCIAL DATA Living Status:  ALONE Admitted from facility:   Level of care:   Primary support name:   Primary support relationship to patient:   Degree of support available:   Pt reports having limited support system that he feels are supportive of a healthy lifestyle    CURRENT CONCERNS Current Concerns  Post-Acute Placement   Other Concerns:    SOCIAL WORK ASSESSMENT / PLAN CSW spoke with pt about substance abuse and completed SBIRT. Pt reports drinking alcohol and using marijuana every weekend. Pt informed CSW he has also been taking percocet for about a month. CSW inquired as to what started percocet use. Pt reported he used it for "head pain" and that he continues to use it for that reason. Pt also stated he used cocaine last week but has only done this once. While speaking with CSW, pt continued to say he "is ready to stop and turn things around". CSW asked if pt has tried to stop before. Pt said he was attending classes at [facility] about 6 months ago. He was receiving help from this agency for "a few weeks". Pt informed CSW he is going to contact the agency to begin receiving treatment again. CSW also provided pt with list of substance abuse treatment programs should pt want/need to try a new agency. CSW asked if pt has tried [employer]. Pt stated he has tried meetings in the past and is already aware of location/time of meetings. Pt was thankful for CSW visit and appreciative of support and resources provided. Pt stated he is ready to make use of resources and stop using all  substances.   Assessment/plan status:  Psychosocial Support/Ongoing Assessment of Needs Other assessment/ plan:   Information/referral to community resources:   Substance abuse treatment list    PATIENT'S/FAMILY'S RESPONSE TO PLAN OF CARE: Pt was accepting of resources       Edward Weber, [place]

## 2013-05-03 NOTE — Progress Notes (Signed)
Pt to d/c home today with rolling walker and HHPT/OT.  The walker has already delivered from [place] to pt's room.  Pt has chosen [facility] for his [place] also.  The phone number listed in EPIC for patient is correct as that is his cell phone.  The address is NOT correct.  Pt's address [place], [address], [place], [place].
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
## 2013-05-03 NOTE — Progress Notes (Signed)
DC with girlfriend at this time, verbally understood DC instructions, no questions asked.

## 2013-05-03 NOTE — Discharge Instructions (Signed)
No driving while taking oxycodone. °

## 2013-05-04 ENCOUNTER — Telehealth (HOSPITAL_COMMUNITY): Payer: Self-pay | Admitting: Emergency Medicine
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
## 2014-02-26 ENCOUNTER — Ambulatory Visit: Payer: Self-pay | Admitting: Gastroenterology

## 2014-03-22 ENCOUNTER — Emergency Department: Payer: Self-pay | Admitting: Emergency Medicine

## 2014-03-27 ENCOUNTER — Emergency Department: Payer: Self-pay | Admitting: Emergency Medicine

## 2014-03-30 LAB — BETA STREP CULTURE(ARMC)

## 2014-04-01 ENCOUNTER — Emergency Department: Payer: Self-pay | Admitting: Emergency Medicine

## 2014-04-01 LAB — COMPREHENSIVE METABOLIC PANEL
ANION GAP: 8 (ref 7–16)
Albumin: 3.5 g/dL (ref 3.4–5.0)
Alkaline Phosphatase: 110 U/L
BUN: 21 mg/dL — AB (ref 7–18)
Bilirubin,Total: 0.9 mg/dL (ref 0.2–1.0)
CHLORIDE: 104 mmol/L (ref 98–107)
Calcium, Total: 8.7 mg/dL (ref 8.5–10.1)
Co2: 25 mmol/L (ref 21–32)
Creatinine: 1.55 mg/dL — ABNORMAL HIGH (ref 0.60–1.30)
EGFR (Non-African Amer.): 50 — ABNORMAL LOW
Glucose: 114 mg/dL — ABNORMAL HIGH (ref 65–99)
Osmolality: 278 (ref 275–301)
POTASSIUM: 4.1 mmol/L (ref 3.5–5.1)
SGOT(AST): 88 U/L — ABNORMAL HIGH (ref 15–37)
SGPT (ALT): 76 U/L — ABNORMAL HIGH
Sodium: 137 mmol/L (ref 136–145)
TOTAL PROTEIN: 7.6 g/dL (ref 6.4–8.2)

## 2014-04-01 LAB — DRUG SCREEN, URINE
Amphetamines, Ur Screen: NEGATIVE (ref ?–1000)
Barbiturates, Ur Screen: NEGATIVE (ref ?–200)
Benzodiazepine, Ur Scrn: NEGATIVE (ref ?–200)
Cannabinoid 50 Ng, Ur ~~LOC~~: POSITIVE (ref ?–50)
Cocaine Metabolite,Ur ~~LOC~~: POSITIVE (ref ?–300)
MDMA (Ecstasy)Ur Screen: NEGATIVE (ref ?–500)
Methadone, Ur Screen: NEGATIVE (ref ?–300)
Opiate, Ur Screen: NEGATIVE (ref ?–300)
Phencyclidine (PCP) Ur S: NEGATIVE (ref ?–25)
Tricyclic, Ur Screen: NEGATIVE (ref ?–1000)

## 2014-04-01 LAB — URINALYSIS, COMPLETE
BILIRUBIN, UR: NEGATIVE
BLOOD: NEGATIVE
GLUCOSE, UR: NEGATIVE mg/dL (ref 0–75)
Hyaline Cast: 6
Ketone: NEGATIVE
Leukocyte Esterase: NEGATIVE
NITRITE: NEGATIVE
PH: 6 (ref 4.5–8.0)
Protein: NEGATIVE
RBC,UR: 5 /HPF (ref 0–5)
Specific Gravity: 1.01 (ref 1.003–1.030)
WBC UR: 1 /HPF (ref 0–5)

## 2014-04-01 LAB — CBC
HCT: 38.9 % — ABNORMAL LOW (ref 40.0–52.0)
HGB: 12.6 g/dL — ABNORMAL LOW (ref 13.0–18.0)
MCH: 33.4 pg (ref 26.0–34.0)
MCHC: 32.4 g/dL (ref 32.0–36.0)
MCV: 103 fL — AB (ref 80–100)
Platelet: 169 10*3/uL (ref 150–440)
RBC: 3.78 10*6/uL — ABNORMAL LOW (ref 4.40–5.90)
RDW: 13.1 % (ref 11.5–14.5)
WBC: 7.8 10*3/uL (ref 3.8–10.6)

## 2014-04-01 LAB — ETHANOL: Ethanol: <3 mg/dL

## 2014-04-01 LAB — TROPONIN I

## 2014-04-02 ENCOUNTER — Emergency Department: Payer: Self-pay | Admitting: Emergency Medicine

## 2014-06-26 ENCOUNTER — Emergency Department: Payer: Self-pay | Admitting: Internal Medicine

## 2014-07-17 ENCOUNTER — Emergency Department
Admit: 2014-07-17 | Disposition: A | Discharge status: Home / Self Care | Payer: Self-pay | Admitting: Emergency Medicine

## 2014-07-17 LAB — BASIC METABOLIC PANEL
Anion Gap: 7 (ref 7–16)
BUN: 10 mg/dL
CALCIUM: 9.3 mg/dL
Chloride: 106 mmol/L
Co2: 27 mmol/L
Creatinine: 0.93 mg/dL
GLUCOSE: 88 mg/dL
Potassium: 4 mmol/L
SODIUM: 140 mmol/L

## 2014-07-17 LAB — CBC
HCT: 37.6 % — AB (ref 40.0–52.0)
HGB: 12.6 g/dL — ABNORMAL LOW (ref 13.0–18.0)
MCH: 32.1 pg (ref 26.0–34.0)
MCHC: 33.4 g/dL (ref 32.0–36.0)
MCV: 96 fL (ref 80–100)
Platelet: 145 10*3/uL — ABNORMAL LOW (ref 150–440)
RBC: 3.91 10*6/uL — ABNORMAL LOW (ref 4.40–5.90)
RDW: 13.6 % (ref 11.5–14.5)
WBC: 7.6 10*3/uL (ref 3.8–10.6)

## 2014-07-17 LAB — TROPONIN I: Troponin-I: <0.03 ng/mL

## 2015-01-17 ENCOUNTER — Emergency Department: Payer: Medicaid Other

## 2015-01-17 ENCOUNTER — Encounter: Payer: Self-pay | Admitting: *Deleted

## 2015-01-17 ENCOUNTER — Emergency Department
Admission: EM | Admit: 2015-01-17 | Discharge: 2015-01-18 | Disposition: A | Discharge status: Home / Self Care | Payer: Medicaid Other | Attending: Emergency Medicine | Admitting: Emergency Medicine

## 2015-01-17 DIAGNOSIS — R350 Frequency of micturition: Secondary | ICD-10-CM | POA: Insufficient documentation

## 2015-01-17 DIAGNOSIS — I10 Essential (primary) hypertension: Secondary | ICD-10-CM | POA: Diagnosis not present

## 2015-01-17 DIAGNOSIS — R300 Dysuria: Secondary | ICD-10-CM

## 2015-01-17 DIAGNOSIS — Z79899 Other long term (current) drug therapy: Secondary | ICD-10-CM | POA: Insufficient documentation

## 2015-01-17 DIAGNOSIS — M109 Gout, unspecified: Secondary | ICD-10-CM

## 2015-01-17 DIAGNOSIS — Z87891 Personal history of nicotine dependence: Secondary | ICD-10-CM | POA: Diagnosis not present

## 2015-01-17 DIAGNOSIS — M25522 Pain in left elbow: Secondary | ICD-10-CM | POA: Diagnosis present

## 2015-01-17 DIAGNOSIS — M10022 Idiopathic gout, left elbow: Secondary | ICD-10-CM | POA: Diagnosis not present

## 2015-01-17 LAB — CBC WITH DIFFERENTIAL/PLATELET
BASOS ABS: 0.1 10*3/uL (ref 0–0.1)
BASOS PCT: 1 %
EOS ABS: 0.2 10*3/uL (ref 0–0.7)
EOS PCT: 3 %
HCT: 40 % (ref 40.0–52.0)
Hemoglobin: 13.5 g/dL (ref 13.0–18.0)
Lymphocytes Relative: 38 %
Lymphs Abs: 2.7 10*3/uL (ref 1.0–3.6)
MCH: 32.6 pg (ref 26.0–34.0)
MCHC: 33.6 g/dL (ref 32.0–36.0)
MCV: 96.8 fL (ref 80.0–100.0)
Monocytes Absolute: 0.8 10*3/uL (ref 0.2–1.0)
Monocytes Relative: 11 %
Neutro Abs: 3.4 10*3/uL (ref 1.4–6.5)
Neutrophils Relative %: 47 %
PLATELETS: 147 10*3/uL — AB (ref 150–440)
RBC: 4.13 MIL/uL — AB (ref 4.40–5.90)
RDW: 13.8 % (ref 11.5–14.5)
WBC: 7.1 10*3/uL (ref 3.8–10.6)

## 2015-01-17 LAB — URINALYSIS COMPLETE WITH MICROSCOPIC (ARMC ONLY)
BACTERIA UA: NONE SEEN
Bilirubin Urine: NEGATIVE
Glucose, UA: NEGATIVE mg/dL
Ketones, ur: NEGATIVE mg/dL
LEUKOCYTES UA: NEGATIVE
Nitrite: NEGATIVE
PH: 6 (ref 5.0–8.0)
SPECIFIC GRAVITY, URINE: 1.016 (ref 1.005–1.030)

## 2015-01-17 MED ORDER — PREDNISONE 20 MG PO TABS
60.0000 mg | ORAL_TABLET | Freq: Once | ORAL | Status: AC
  Administered 2015-01-17: 60 mg via ORAL
  Filled 2015-01-17: qty 3

## 2015-01-17 NOTE — ED Notes (Signed)
Pt made aware for second need for urine sample, pt given cup, will let RN know once able to provide sample.

## 2015-01-17 NOTE — ED Notes (Signed)
Pt reports swelling and pain in his left elbow for about 2 weeks. Pt says he is having urinary frequency and burning for 5 days. Denies fevers.
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
## 2015-01-18 LAB — BASIC METABOLIC PANEL
Anion gap: 7 (ref 5–15)
BUN: 14 mg/dL (ref 6–20)
CHLORIDE: 106 mmol/L (ref 101–111)
CO2: 26 mmol/L (ref 22–32)
Calcium: 8.7 mg/dL — ABNORMAL LOW (ref 8.9–10.3)
Creatinine, Ser: 1.05 mg/dL (ref 0.61–1.24)
GFR calc Af Amer: >60 mL/min (ref >60)
GFR calc non Af Amer: >60 mL/min (ref >60)
Glucose, Bld: 93 mg/dL (ref 65–99)
POTASSIUM: 3.5 mmol/L (ref 3.5–5.1)
SODIUM: 139 mmol/L (ref 135–145)

## 2015-01-18 LAB — CHLAMYDIA/NGC RT PCR (ARMC ONLY)
CHLAMYDIA TR: NOT DETECTED
N gonorrhoeae: NOT DETECTED

## 2015-01-18 MED ORDER — PREDNISONE 10 MG PO TABS: ORAL_TABLET | ORAL | Status: DC

## 2015-01-18 NOTE — Discharge Instructions (Signed)
Gout Gout is when your joints become red, sore, and swell (inflamed). This is caused by the buildup of uric acid crystals in the joints. Uric acid is a chemical that is normally in the blood. If the level of uric acid gets too high in the blood, these crystals form in your joints and tissues. Over time, these crystals can form into masses near the joints and tissues. These masses can destroy bone and cause the bone to look misshapen (deformed). HOME CARE   Do not take aspirin for pain.  Only take medicine as told by your doctor.  Rest the joint as much as you can. When in bed, keep sheets and blankets off painful areas.  Keep the sore joints raised (elevated).  Put warm or cold packs on painful joints. Use of warm or cold packs depends on which works best for you.  Use crutches if the painful joint is in your leg.  Drink enough fluids to keep your pee (urine) clear or pale yellow. Limit alcohol, sugary drinks, and drinks with fructose in them.  Follow your diet instructions. Pay careful attention to how much protein you eat. Include fruits, vegetables, whole grains, and fat-free or low-fat milk products in your daily diet. Talk to your doctor or dietitian about the use of coffee, vitamin C, and cherries. These may help lower uric acid levels.  Keep a healthy body weight. GET HELP RIGHT AWAY IF:   You have watery poop (diarrhea), throw up (vomit), or have any side effects from medicines.  You do not feel better in 24 hours, or you are getting worse.  Your joint becomes suddenly more tender, and you have chills or a fever. MAKE SURE YOU:   Understand these instructions.  Will watch your condition.  Will get help right away if you are not doing well or get worse.   This information is not intended to replace advice given to you by your health care provider. Make sure you discuss any questions you have with your health care provider.   Document Released: 01/07/2008 Document Revised:  04/20/2014 Document Reviewed: 11/11/2011 Elsevier Interactive Patient Education 2016 [employer]  Dysuria Dysuria is pain or discomfort while urinating. The pain or discomfort may be felt in the tube that carries urine out of the bladder (urethra) or in the surrounding tissue of the genitals. The pain may also be felt in the groin area, lower abdomen, and lower back. You may have to urinate frequently or have the sudden feeling that you have to urinate (urgency). Dysuria can affect both men and women, but is more common in women. Dysuria can be caused by many different things, including:  Urinary tract infection in women.  Infection of the kidney or bladder.  Kidney stones or bladder stones.  Certain sexually transmitted infections (STIs), such as chlamydia.  Dehydration.  Inflammation of the vagina.  Use of certain medicines.  Use of certain soaps or scented products that cause irritation. HOME CARE INSTRUCTIONS Watch your dysuria for any changes. The following actions may help to reduce any discomfort you are feeling:  Drink enough fluid to keep your urine clear or pale yellow.  Empty your bladder often. Avoid holding urine for long periods of time.  After a bowel movement or urination, women should cleanse from front to back, using each tissue only once.  Empty your bladder after sexual intercourse.  Take medicines only as directed by your health care provider.  If you were prescribed an antibiotic medicine, finish  it all even if you start to feel better.  Avoid caffeine, tea, and alcohol. They can irritate the bladder and make dysuria worse. In men, alcohol may irritate the prostate.  Keep all follow-up visits as directed by your health care provider. This is important.  If you had any tests done to find the cause of dysuria, it is your responsibility to obtain your test results. Ask the lab or department performing the test when and how you will get your results. Talk  with your health care provider if you have any questions about your results. SEEK MEDICAL CARE IF:  You develop pain in your back or sides.  You have a fever.  You have nausea or vomiting.  You have blood in your urine.  You are not urinating as often as you usually do. SEEK IMMEDIATE MEDICAL CARE IF:  You pain is severe and not relieved with medicines.  You are unable to hold down any fluids.  You or someone else notices a change in your mental function.  You have a rapid heartbeat at rest.  You have shaking or chills.  You feel extremely weak.   This information is not intended to replace advice given to you by your health care provider. Make sure you discuss any questions you have with your health care provider.   Document Released: 12/27/2003 Document Revised: 04/20/2014 Document Reviewed: 11/23/2013 Elsevier Interactive Patient Education [employer].

## 2015-01-18 NOTE — ED Notes (Signed)
Lab called regarding add on and new urine sent. Will process at this time

## 2015-01-18 NOTE — ED Notes (Signed)
MD Scheavitz at bedside. 

## 2015-02-15 ENCOUNTER — Emergency Department: Payer: Medicare Other

## 2015-02-15 ENCOUNTER — Emergency Department
Admission: EM | Admit: 2015-02-15 | Discharge: 2015-02-15 | Disposition: A | Discharge status: Home / Self Care | Payer: Medicare Other | Attending: Emergency Medicine | Admitting: Emergency Medicine

## 2015-02-15 DIAGNOSIS — K2981 Duodenitis with bleeding: Secondary | ICD-10-CM

## 2015-02-15 DIAGNOSIS — Z791 Long term (current) use of non-steroidal anti-inflammatories (NSAID): Secondary | ICD-10-CM | POA: Diagnosis not present

## 2015-02-15 DIAGNOSIS — Z79899 Other long term (current) drug therapy: Secondary | ICD-10-CM | POA: Insufficient documentation

## 2015-02-15 DIAGNOSIS — I10 Essential (primary) hypertension: Secondary | ICD-10-CM | POA: Insufficient documentation

## 2015-02-15 DIAGNOSIS — K625 Hemorrhage of anus and rectum: Secondary | ICD-10-CM | POA: Diagnosis present

## 2015-02-15 DIAGNOSIS — K2991 Gastroduodenitis, unspecified, with bleeding: Secondary | ICD-10-CM | POA: Diagnosis not present

## 2015-02-15 DIAGNOSIS — Z87891 Personal history of nicotine dependence: Secondary | ICD-10-CM | POA: Insufficient documentation

## 2015-02-15 LAB — CBC WITH DIFFERENTIAL/PLATELET
BASOS ABS: 0.1 10*3/uL (ref 0–0.1)
Basophils Relative: 1 %
Eosinophils Absolute: 0.2 10*3/uL (ref 0–0.7)
HEMATOCRIT: 37.9 % — AB (ref 40.0–52.0)
Hemoglobin: 12.7 g/dL — ABNORMAL LOW (ref 13.0–18.0)
LYMPHS ABS: 2.9 10*3/uL (ref 1.0–3.6)
Lymphocytes Relative: 38 %
MCH: 32 pg (ref 26.0–34.0)
MCHC: 33.5 g/dL (ref 32.0–36.0)
MCV: 95.5 fL (ref 80.0–100.0)
MONO ABS: 0.7 10*3/uL (ref 0.2–1.0)
Monocytes Relative: 9 %
NEUTROS ABS: 3.7 10*3/uL (ref 1.4–6.5)
Neutrophils Relative %: 49 %
Platelets: 174 10*3/uL (ref 150–440)
RBC: 3.97 MIL/uL — AB (ref 4.40–5.90)
RDW: 13.7 % (ref 11.5–14.5)
WBC: 7.6 10*3/uL (ref 3.8–10.6)

## 2015-02-15 LAB — CBC
HEMATOCRIT: 35 % — AB (ref 40.0–52.0)
HEMOGLOBIN: 11.7 g/dL — AB (ref 13.0–18.0)
MCH: 32.1 pg (ref 26.0–34.0)
MCHC: 33.6 g/dL (ref 32.0–36.0)
MCV: 95.7 fL (ref 80.0–100.0)
Platelets: 158 10*3/uL (ref 150–440)
RBC: 3.66 MIL/uL — AB (ref 4.40–5.90)
RDW: 13.1 % (ref 11.5–14.5)
WBC: 9.4 10*3/uL (ref 3.8–10.6)

## 2015-02-15 LAB — COMPREHENSIVE METABOLIC PANEL
ALT: 27 U/L (ref 17–63)
AST: 37 U/L (ref 15–41)
Albumin: 3.7 g/dL (ref 3.5–5.0)
Alkaline Phosphatase: 75 U/L (ref 38–126)
Anion gap: 6 (ref 5–15)
BILIRUBIN TOTAL: 0.4 mg/dL (ref 0.3–1.2)
BUN: 18 mg/dL (ref 6–20)
CO2: 25 mmol/L (ref 22–32)
CREATININE: 1.18 mg/dL (ref 0.61–1.24)
Calcium: 8.8 mg/dL — ABNORMAL LOW (ref 8.9–10.3)
Chloride: 109 mmol/L (ref 101–111)
Glucose, Bld: 116 mg/dL — ABNORMAL HIGH (ref 65–99)
Potassium: 3.9 mmol/L (ref 3.5–5.1)
Sodium: 140 mmol/L (ref 135–145)
TOTAL PROTEIN: 6.7 g/dL (ref 6.5–8.1)

## 2015-02-15 LAB — URINALYSIS COMPLETE WITH MICROSCOPIC (ARMC ONLY)
Bacteria, UA: NONE SEEN
Bilirubin Urine: NEGATIVE
GLUCOSE, UA: NEGATIVE mg/dL
Ketones, ur: NEGATIVE mg/dL
Leukocytes, UA: NEGATIVE
Nitrite: NEGATIVE
PH: 5 (ref 5.0–8.0)
Protein, ur: 100 mg/dL — AB
Specific Gravity, Urine: 1.021 (ref 1.005–1.030)

## 2015-02-15 LAB — LIPASE, BLOOD: LIPASE: 36 U/L (ref 11–51)

## 2015-02-15 MED ORDER — PANTOPRAZOLE SODIUM 40 MG PO TBEC: 40.0000 mg | DELAYED_RELEASE_TABLET | Freq: Every day | ORAL | Status: DC

## 2015-02-15 MED ORDER — IOHEXOL 240 MG/ML SOLN
25.0000 mL | Freq: Once | INTRAMUSCULAR | Status: AC | PRN
  Administered 2015-02-15: 25 mL via ORAL

## 2015-02-15 MED ORDER — IOHEXOL 350 MG/ML SOLN
80.0000 mL | Freq: Once | INTRAVENOUS | Status: AC | PRN
Start: 2015-02-15 — End: 2015-02-15
  Administered 2015-02-15: 80 mL via INTRAVENOUS

## 2015-02-15 MED ORDER — PANTOPRAZOLE SODIUM 40 MG IV SOLR
40.0000 mg | Freq: Once | INTRAVENOUS | Status: AC
  Administered 2015-02-15: 40 mg via INTRAVENOUS
  Filled 2015-02-15: qty 40

## 2015-02-15 MED ORDER — SUCRALFATE 1 G PO TABS: 1.0000 g | ORAL_TABLET | Freq: Four times a day (QID) | ORAL | Status: DC
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
## 2015-02-15 NOTE — ED Notes (Signed)
Patient states he went to the bathroom and saw dark red blood in toilet bowl. States the amount of blood was "a lot" and filled up the bowl. "Jumped in the shower and came on over." States abdominal pain which is located diffusely over the umbilical region. Thought it was "something I ate" but not sure. Started taking Naprosyn and states takes "a lot". Said the doctor prescribed them to him so he just started buying them and taking them. Asked why he did that and he states he has gout a lot.

## 2015-02-15 NOTE — ED Notes (Signed)
Report given to Jennifer, RN

## 2015-02-15 NOTE — Discharge Instructions (Signed)
Please seek medical attention for any high fevers, chest pain, shortness of breath, change in behavior, persistent vomiting, bloody stool or any other new or concerning symptoms.   Gastrointestinal Bleeding Gastrointestinal (GI) bleeding means there is bleeding somewhere along the digestive tract, between the mouth and anus. CAUSES  There are many different problems that can cause GI bleeding. Possible causes include:  Esophagitis. This is inflammation, irritation, or swelling of the esophagus.  Hemorrhoids.These are veins that are full of blood (engorged) in the rectum. They cause pain, inflammation, and may bleed.  Anal fissures.These are areas of painful tearing which may bleed. They are often caused by passing hard stool.  Diverticulosis.These are pouches that form on the colon over time, with age, and may bleed significantly.  Diverticulitis.This is inflammation in areas with diverticulosis. It can cause pain, fever, and bloody stools, although bleeding is rare.  Polyps and cancer. Colon cancer often starts out as precancerous polyps.  Gastritis and ulcers.Bleeding from the upper gastrointestinal tract (near the stomach) may travel through the intestines and produce black, sometimes tarry, often bad smelling stools. In certain cases, if the bleeding is fast enough, the stools may not be black, but red. This condition may be life-threatening. SYMPTOMS   Vomiting bright red blood or material that looks like coffee grounds.  Bloody, black, or tarry stools. DIAGNOSIS  Your caregiver may diagnose your condition by taking your history and performing a physical exam. More tests may be needed, including:  X-rays and other imaging tests.  Esophagogastroduodenoscopy (EGD). This test uses a flexible, lighted tube to look at your esophagus, stomach, and small intestine.  Colonoscopy. This test uses a flexible, lighted tube to look at your colon. TREATMENT  Treatment depends on the  cause of your bleeding.   For bleeding from the esophagus, stomach, small intestine, or colon, the caregiver doing your EGD or colonoscopy may be able to stop the bleeding as part of the procedure.  Inflammation or infection of the colon can be treated with medicines.  Many rectal problems can be treated with creams, suppositories, or warm baths.  Surgery is sometimes needed.  Blood transfusions are sometimes needed if you have lost a lot of blood. If bleeding is slow, you may be allowed to go home. If there is a lot of bleeding, you will need to stay in the hospital for observation. HOME CARE INSTRUCTIONS   Take any medicines exactly as prescribed.  Keep your stools soft by eating foods that are high in fiber. These foods include whole grains, legumes, fruits, and vegetables. Prunes (1 to 3 a day) work well for many people.  Drink enough fluids to keep your urine clear or pale yellow. SEEK IMMEDIATE MEDICAL CARE IF:   Your bleeding increases.  You feel lightheaded, weak, or you faint.  You have severe cramps in your back or abdomen.  You pass large blood clots in your stool.  Your problems are getting worse. MAKE SURE YOU:   Understand these instructions.  Will watch your condition.  Will get help right away if you are not doing well or get worse.   This information is not intended to replace advice given to you by your health care provider. Make sure you discuss any questions you have with your health care provider.   Document Released: 03/27/2000 Document Revised: 03/16/2012 Document Reviewed: 09/17/2014 Elsevier Interactive Patient Education [employer].

## 2015-02-15 NOTE — ED Notes (Signed)
MD at bedside for eval.

## 2015-02-15 NOTE — ED Provider Notes (Signed)
-----------------------------------------   11:16 AM on 02/15/2015 -----------------------------------------  CT abdomen and pelvis IMPRESSION: Distal gastritis and proximal duodenitis with an apparent ulceration along the rightward aspect of the first portion of the duodenum. No fistula or microperforation seen.  No bowel obstruction. No abscess. Appendix appears normal.  Question a degree of underlying hepatic cirrhosis. Liver has a somewhat nodular contour with prominence of the caudate lobe.  9 x 4 mm nonobstructing calculus mid to lower pole left kidney. No hydronephrosis on either side. No ureteral calculi.  Pars defects at L5 bilaterally with 1.5 cm of anterolisthesis of L5 on S1. Extensive osteoarthritic changes noted in this region.  Repeat hemoglobin 11.7  I discuss the finding of the CT scan and the repeat hemoglobin with the patient. I discussed that since the hemoglobin did go down he could be observed here in the hospital. I did discuss that likely his naproxen uses it caused a gastritis. I also advised that he stop the naproxen. Again I offered the patient admission however he did not want to be admitted. He wanted to be discharged. At this point patient's vital signs without concerning tachycardia or hypotension. I discussed with patient return precautions. Will discharge home with medication to help with gastritis.  Nance Pear, MD 02/15/15 1440

## 2015-04-18 ENCOUNTER — Ambulatory Visit: Admission: RE | Admit: 2015-04-18 | Payer: Medicare Other | Source: Ambulatory Visit | Admitting: Gastroenterology

## 2015-04-18 ENCOUNTER — Encounter: Admission: RE | Payer: Self-pay | Source: Ambulatory Visit

## 2015-04-18 HISTORY — DX: Helicobacter pylori (H. pylori) as the cause of diseases classified elsewhere: B96.81

## 2015-04-18 HISTORY — DX: Helicobacter pylori (H. pylori) as the cause of diseases classified elsewhere: K29.70

## 2015-04-18 HISTORY — DX: Vitiligo: L80

## 2015-04-18 HISTORY — DX: Rhabdomyolysis: M62.82

## 2015-04-18 HISTORY — DX: Gastro-esophageal reflux disease with esophagitis: K21.0

## 2015-04-18 HISTORY — DX: Anemia, unspecified: D64.9

## 2015-04-18 HISTORY — DX: Allergic rhinitis, unspecified: J30.9

## 2015-04-18 HISTORY — DX: Allergy, unspecified, initial encounter: T78.40XA

## 2015-04-18 HISTORY — DX: Alcohol dependence, uncomplicated: F10.20

## 2015-04-18 HISTORY — DX: Myoneural disorder, unspecified: G70.9

## 2015-04-18 HISTORY — DX: Unspecified osteoarthritis, unspecified site: M19.90

## 2015-04-18 HISTORY — DX: Gastro-esophageal reflux disease with esophagitis, without bleeding: K21.00

## 2015-04-18 SURGERY — COLONOSCOPY WITH PROPOFOL
Anesthesia: General

## 2015-05-08 ENCOUNTER — Encounter: Payer: Self-pay | Admitting: *Deleted

## 2015-05-09 ENCOUNTER — Encounter: Payer: Self-pay | Admitting: *Deleted

## 2015-05-09 ENCOUNTER — Ambulatory Visit: Payer: Medicare Other | Admitting: Anesthesiology

## 2015-05-09 ENCOUNTER — Ambulatory Visit
Admission: RE | Admit: 2015-05-09 | Discharge: 2015-05-09 | Disposition: A | Discharge status: Home / Self Care | Payer: Medicare Other | Source: Ambulatory Visit | Attending: Gastroenterology | Admitting: Gastroenterology

## 2015-05-09 ENCOUNTER — Encounter
Admission: RE | Disposition: A | Discharge status: Home / Self Care | Payer: Self-pay | Source: Ambulatory Visit | Attending: Gastroenterology

## 2015-05-09 DIAGNOSIS — Z91018 Allergy to other foods: Secondary | ICD-10-CM | POA: Insufficient documentation

## 2015-05-09 DIAGNOSIS — F1020 Alcohol dependence, uncomplicated: Secondary | ICD-10-CM | POA: Diagnosis not present

## 2015-05-09 DIAGNOSIS — Z7951 Long term (current) use of inhaled steroids: Secondary | ICD-10-CM | POA: Insufficient documentation

## 2015-05-09 DIAGNOSIS — F329 Major depressive disorder, single episode, unspecified: Secondary | ICD-10-CM | POA: Insufficient documentation

## 2015-05-09 DIAGNOSIS — M1990 Unspecified osteoarthritis, unspecified site: Secondary | ICD-10-CM | POA: Diagnosis not present

## 2015-05-09 DIAGNOSIS — G629 Polyneuropathy, unspecified: Secondary | ICD-10-CM | POA: Insufficient documentation

## 2015-05-09 DIAGNOSIS — Z888 Allergy status to other drugs, medicaments and biological substances status: Secondary | ICD-10-CM | POA: Insufficient documentation

## 2015-05-09 DIAGNOSIS — K649 Unspecified hemorrhoids: Secondary | ICD-10-CM | POA: Insufficient documentation

## 2015-05-09 DIAGNOSIS — K5730 Diverticulosis of large intestine without perforation or abscess without bleeding: Secondary | ICD-10-CM | POA: Insufficient documentation

## 2015-05-09 DIAGNOSIS — Z7952 Long term (current) use of systemic steroids: Secondary | ICD-10-CM | POA: Insufficient documentation

## 2015-05-09 DIAGNOSIS — G709 Myoneural disorder, unspecified: Secondary | ICD-10-CM | POA: Insufficient documentation

## 2015-05-09 DIAGNOSIS — K210 Gastro-esophageal reflux disease with esophagitis: Secondary | ICD-10-CM | POA: Diagnosis not present

## 2015-05-09 DIAGNOSIS — B182 Chronic viral hepatitis C: Secondary | ICD-10-CM | POA: Insufficient documentation

## 2015-05-09 DIAGNOSIS — Z8673 Personal history of transient ischemic attack (TIA), and cerebral infarction without residual deficits: Secondary | ICD-10-CM | POA: Insufficient documentation

## 2015-05-09 DIAGNOSIS — K2950 Unspecified chronic gastritis without bleeding: Secondary | ICD-10-CM | POA: Insufficient documentation

## 2015-05-09 DIAGNOSIS — Z91011 Allergy to milk products: Secondary | ICD-10-CM | POA: Diagnosis not present

## 2015-05-09 DIAGNOSIS — I10 Essential (primary) hypertension: Secondary | ICD-10-CM | POA: Insufficient documentation

## 2015-05-09 DIAGNOSIS — Z79899 Other long term (current) drug therapy: Secondary | ICD-10-CM | POA: Diagnosis not present

## 2015-05-09 DIAGNOSIS — Z87891 Personal history of nicotine dependence: Secondary | ICD-10-CM | POA: Insufficient documentation

## 2015-05-09 DIAGNOSIS — D649 Anemia, unspecified: Secondary | ICD-10-CM | POA: Insufficient documentation

## 2015-05-09 HISTORY — DX: Chronic viral hepatitis C: B18.2

## 2015-05-09 HISTORY — DX: Unspecified osteoarthritis, unspecified site: M19.90

## 2015-05-09 HISTORY — PX: ESOPHAGOGASTRODUODENOSCOPY: SHX5428

## 2015-05-09 HISTORY — PX: COLONOSCOPY WITH PROPOFOL: SHX5780

## 2015-05-09 HISTORY — DX: Polyneuropathy, unspecified: G62.9

## 2015-05-09 SURGERY — EGD (ESOPHAGOGASTRODUODENOSCOPY)
Anesthesia: General

## 2015-05-09 MED ORDER — PROPOFOL 500 MG/50ML IV EMUL
INTRAVENOUS | Status: DC | PRN
  Administered 2015-05-09: 180 ug/kg/min via INTRAVENOUS

## 2015-05-09 MED ORDER — PROPOFOL 10 MG/ML IV BOLUS
INTRAVENOUS | Status: DC | PRN
  Administered 2015-05-09: 100 mg via INTRAVENOUS
  Administered 2015-05-09: 30 mg via INTRAVENOUS
  Administered 2015-05-09: 50 mg via INTRAVENOUS

## 2015-05-09 MED ORDER — SODIUM CHLORIDE 0.9 % IV SOLN
INTRAVENOUS | Status: DC
  Administered 2015-05-09: 1000 mL via INTRAVENOUS
  Administered 2015-05-09: 13:00:00 via INTRAVENOUS

## 2015-05-09 NOTE — Op Note (Signed)
[facility] Gastroenterology Patient Name: Edward Weber Procedure Date: 05/09/2015 1:06 PM MRN: [id number] Account #: [id number] Date of Birth: 06/21/1960 Admit Type: Outpatient Age: 55 Room: [facility] ENDO ROOM 4 Gender: Male Note Status: Finalized Procedure:         Upper GI endoscopy Indications:       Anemia, Suspected esophageal reflux, Abnormal CT of the GI                     tract Providers:         Lupita Dawn. Candace Cruise, MD Referring MD:      Glendon Axe (Referring MD) Medicines:         Monitored Anesthesia Care Complications:     No immediate complications. Procedure:         Pre-Anesthesia Assessment:                    - Prior to the procedure, a History and Physical was                     performed, and patient medications, allergies and                     sensitivities were reviewed. The patient's tolerance of                     previous anesthesia was reviewed.                    - The risks and benefits of the procedure and the sedation                     options and risks were discussed with the patient. All                     questions were answered and informed consent was obtained.                    - After reviewing the risks and benefits, the patient was                     deemed in satisfactory condition to undergo the procedure.                    After obtaining informed consent, the endoscope was passed                     under direct vision. Throughout the procedure, the                     patient's blood pressure, pulse, and oxygen saturations                     were monitored continuously. The Endoscope was introduced                     through the mouth, and advanced to the second part of                     duodenum. The upper GI endoscopy was accomplished without                     difficulty. The patient tolerated the procedure well. Findings:      The examined esophagus was normal.  Localized mild inflammation characterized  by erythema was found in the       gastric antrum. Biopsies were taken with a cold forceps for histology.      The exam was otherwise without abnormality.      The examined duodenum was normal. Impression:        - Normal esophagus.                    - Gastritis. Biopsied.                    - The examination was otherwise normal.                    - Normal examined duodenum. Recommendation:    - Discharge patient to home.                    - Observe patient's clinical course.                    - Await pathology results.                    - The findings and recommendations were discussed with the                     patient. Procedure Code(s): --- Professional ---                    [phone], Esophagogastroduodenoscopy, flexible, transoral;                     with biopsy, single or multiple Diagnosis Code(s): --- Professional ---                    K29.70, Gastritis, unspecified, without bleeding                    D64.9, Anemia, unspecified                    R93.3, Abnormal findings on diagnostic imaging of other                     parts of digestive tract CPT copyright 2014 American Medical Association. All rights reserved. The codes documented in this report are preliminary and upon coder review may  be revised to meet current compliance requirements. Hulen Luster, MD 05/09/2015 1:16:47 PM This report has been signed electronically. Number of Addenda: 0 Note Initiated On: 05/09/2015 1:06 PM      [facility]

## 2015-05-09 NOTE — H&P (Signed)
Primary Care Physician:  Glendon Axe, MD Primary Gastroenterologist:  Dr. Candace Cruise  Pre-Procedure History & Physical: HPI:  Edward Weber is a 55 y.o. male is here for an EGD/colonoscopy   Past Medical History  Diagnosis Date  . Hypertension   . GERD (gastroesophageal reflux disease)   . Depression   . Hepatitis   . Stroke ([place])   . Alcoholism ([place])   . Allergic rhinitis   . Allergic state   . Anemia   . Helicobacter pylori gastritis   . Neuromuscular disorder ([place])   . Esophagitis, reflux   . Rhabdomyolysis   . Vitiligo   . Chronic hepatitis C ([place])   . Arthritis   . Osteoarthrosis   . Peripheral neuropathy ([place])   . Stroke [facility])     Past Surgical History  Procedure Laterality Date  . Tee without cardioversion N/A 05/20/2012    Procedure: TRANSESOPHAGEAL ECHOCARDIOGRAM (TEE);  Surgeon: Fay Records, MD;  Location: [facility] ENDOSCOPY;  Service: Cardiovascular;  Laterality: N/A;  . Finger debridement  1976    s/p infected dog bite    Prior to Admission medications   Medication Sig Start Date End Date Taking? Authorizing Provider  allopurinol (ZYLOPRIM) 100 MG tablet Take 100 mg by mouth 2 (two) times daily.   Yes Historical Provider, MD  clobetasol cream (TEMOVATE) [id number] % Apply 1 application topically 2 (two) times daily.   Yes Historical Provider, MD  famotidine (PEPCID) 20 MG tablet Take 20 mg by mouth 2 (two) times daily.   Yes Historical Provider, MD  ferrous gluconate (FERGON) 324 MG tablet Take 324 mg by mouth daily with breakfast.   Yes Historical Provider, MD  fluticasone (FLONASE) 50 MCG/ACT nasal spray Place 2 sprays into both nostrils daily.   Yes Historical Provider, MD  lisinopril-hydrochlorothiazide (PRINZIDE,ZESTORETIC) 20-25 MG tablet Take 1 tablet by mouth daily.   Yes Historical Provider, MD  Multiple Vitamin (MULTIVITAMIN) tablet Take 1 tablet by mouth daily.   Yes Historical Provider, MD  thiamine 100 MG tablet Take 100 mg by mouth daily.   Yes Historical  Provider, MD  colchicine 0.6 MG tablet Take 1 tablet (0.6 mg total) by mouth 2 (two) times daily. 05/27/12   Ivan Anchors Love, PA-C  doxepin (SINEQUAN) 25 MG capsule Take 1 capsule (25 mg total) by mouth at bedtime. 05/27/12   Bary Leriche, PA-C  DULoxetine (CYMBALTA) 60 MG capsule Take 1 capsule (60 mg total) by mouth daily. 05/27/12   Bary Leriche, PA-C  gabapentin (NEURONTIN) 300 MG capsule Take 1 capsule (300 mg total) by mouth 2 (two) times daily. 05/27/12   Bary Leriche, PA-C  hydrochlorothiazide (HYDRODIURIL) 25 MG tablet Take 1 tablet (25 mg total) by mouth daily with breakfast. 05/27/12   Ivan Anchors Love, PA-C  hydrocortisone cream 1 % Apply 1 application topically daily as needed for itching.    Historical Provider, MD  hydrOXYzine (ATARAX/VISTARIL) 25 MG tablet Take 25 mg by mouth 3 (three) times daily as needed. For itching    Historical Provider, MD  lansoprazole (PREVACID) 15 MG capsule Take 15 mg by mouth daily as needed. For acid reflux    Historical Provider, MD  oxyCODONE-acetaminophen (ROXICET) 5-325 MG per tablet Take 1-2 tablets by mouth every 4 (four) hours as needed (Pain). 05/03/13   Lisette Abu, PA-C  pantoprazole (PROTONIX) 40 MG tablet Take 1 tablet (40 mg total) by mouth daily. 02/15/15 02/15/16  Nance Pear, MD  predniSONE (DELTASONE) 10 MG  tablet 50mg  po x5 days 40mg  po x2 days 30mg  po x2 days 20mg  po x2 days 10mg  po x2 days 01/18/15   Orbie Pyo, MD  senna-docusate (SENOKOT-S) 8.6-50 MG per tablet Take 1 tablet by mouth 2 (two) times daily as needed for mild constipation.    Historical Provider, MD  sucralfate (CARAFATE) 1 G tablet Take 1 tablet (1 g total) by mouth 4 (four) times daily. 02/15/15   Nance Pear, MD    Allergies as of 04/18/2015 - Review Complete 04/17/2015  Allergen Reaction Noted  . Beef-derived products  05/01/2013  . Colchicine  04/17/2015  . Milk-related compounds Nausea And Vomiting 05/23/2012    History reviewed. No  pertinent family history.  Social History   Social History  . Marital Status: Single    Spouse Name: N/A  . Number of Children: N/A  . Years of Education: N/A   Occupational History  . Not on file.   Social History Main Topics  . Smoking status: Former Research scientist (life sciences)  . Smokeless tobacco: Never Used  . Alcohol Use: 5.4 oz/week    6 Cans of beer, 3 Shots of liquor per week     Comment: only on the weekends  . Drug Use: Yes    Special: Marijuana     Comment: no longer smokes marijuana  . Sexual Activity: Not on file   Other Topics Concern  . Not on file   Social History Narrative    Review of Systems: See HPI, otherwise negative ROS  Physical Exam: There were no vitals taken for this visit. General:   Alert,  pleasant and cooperative in NAD Head:  Normocephalic and atraumatic. Neck:  Supple; no masses or thyromegaly. Lungs:  Clear throughout to auscultation.    Heart:  Regular rate and rhythm. Abdomen:  Soft, nontender and nondistended. Normal bowel sounds, without guarding, and without rebound.   Neurologic:  Alert and  oriented x4;  grossly normal neurologically.  Impression/Plan: Edward Weber is here for an EGD/colonoscopy to be performed for anemia, GERD, and abnormal CT  Risks, benefits, limitations, and alternatives regarding EGD/colonoscopy have been reviewed with the patient.  Questions have been answered.  All parties agreeable.   Ranferi Clingan, Lupita Dawn, MD  05/09/2015, 12:33 PM

## 2015-05-09 NOTE — Op Note (Signed)
[facility] Gastroenterology Patient Name: Edward Weber Procedure Date: 05/09/2015 1:05 PM MRN: [id number] Account #: [id number] Date of Birth: 29-Dec-1960 Admit Type: Outpatient Age: 55 Room: [facility] ENDO ROOM 4 Gender: Male Note Status: Finalized Procedure:         Colonoscopy Indications:       Anemia, Abnormal CT of the GI tract Providers:         Lupita Dawn. Candace Cruise, MD Referring MD:      Glendon Axe (Referring MD) Medicines:         Monitored Anesthesia Care Complications:     No immediate complications. Procedure:         Pre-Anesthesia Assessment:                    - Prior to the procedure, a History and Physical was                     performed, and patient medications, allergies and                     sensitivities were reviewed. The patient's tolerance of                     previous anesthesia was reviewed.                    - The risks and benefits of the procedure and the sedation                     options and risks were discussed with the patient. All                     questions were answered and informed consent was obtained.                    - After reviewing the risks and benefits, the patient was                     deemed in satisfactory condition to undergo the procedure.                    After obtaining informed consent, the colonoscope was                     passed under direct vision. Throughout the procedure, the                     patient's blood pressure, pulse, and oxygen saturations                     were monitored continuously. The Olympus CF-H180AL                     colonoscope ( S#: [id number] ) was introduced through the                     anus and advanced to the the cecum, identified by                     appendiceal orifice and ileocecal valve. The colonoscopy                     was performed without difficulty. The patient tolerated  the procedure well. The quality of the bowel preparation           was good. Findings:      The perianal exam findings include hemorrhoids.      A few small-mouthed diverticula were found in the ascending colon.      The exam was otherwise without abnormality. Impression:        - Hemorrhoids found on perianal exam.                    - Diverticulosis in the ascending colon.                    - The examination was otherwise normal.                    - No specimens collected. Recommendation:    - Discharge patient to home.                    - Repeat colonoscopy in 10 years for surveillance.                    - The findings and recommendations were discussed with the                     patient. Procedure Code(s): --- Professional ---                    [phone], Colonoscopy, flexible; diagnostic, including                     collection of specimen(s) by brushing or washing, when                     performed (separate procedure) Diagnosis Code(s): --- Professional ---                    K64.9, Unspecified hemorrhoids                    D64.9, Anemia, unspecified                    K57.30, Diverticulosis of large intestine without                     perforation or abscess without bleeding                    R93.3, Abnormal findings on diagnostic imaging of other                     parts of digestive tract CPT copyright 2014 American Medical Association. All rights reserved. The codes documented in this report are preliminary and upon coder review may  be revised to meet current compliance requirements. Hulen Luster, MD 05/09/2015 1:30:07 PM This report has been signed electronically. Number of Addenda: 0 Note Initiated On: 05/09/2015 1:05 PM Scope Withdrawal Time: 0 hours 6 minutes 53 seconds  Total Procedure Duration: 0 hours 9 minutes 38 seconds       [facility]

## 2015-05-09 NOTE — Anesthesia Preprocedure Evaluation (Signed)
Anesthesia Evaluation  Patient identified by MRN, date of birth, ID band Patient awake    Reviewed: Allergy & Precautions, H&P , NPO status , Patient's Chart, lab work & pertinent test results, reviewed documented beta blocker date and time   History of Anesthesia Complications Negative for: history of anesthetic complications  Airway Mallampati: II  TM Distance: >3 FB Neck ROM: full    Dental no notable dental hx.    Pulmonary neg shortness of breath, neg sleep apnea, neg COPD, neg recent URI, former smoker,    Pulmonary exam normal breath sounds clear to auscultation       Cardiovascular Exercise Tolerance: Good hypertension, (-) angina(-) CAD, (-) Past MI, (-) Cardiac Stents and (-) CABG Normal cardiovascular exam(-) dysrhythmias (-) Valvular Problems/Murmurs Rhythm:regular Rate:Normal     Neuro/Psych neg Seizures PSYCHIATRIC DISORDERS (Depression and alcoholism)  Neuromuscular disease (Neuropathy) CVA, No Residual Symptoms    GI/Hepatic GERD  ,(+) Hepatitis - (s/p treatment), C  Endo/Other  negative endocrine ROS  Renal/GU negative Renal ROS  negative genitourinary   Musculoskeletal   Abdominal   Peds  Hematology  (+) Blood dyscrasia, anemia ,   Anesthesia Other Findings Past Medical History:   Hypertension                                                 GERD (gastroesophageal reflux disease)                       Depression                                                   Hepatitis                                                    Stroke (HCC)                                                 Alcoholism (HCC)                                             Allergic rhinitis                                            Allergic state                                               Anemia  Helicobacter pylori gastritis                                Neuromuscular  disorder (HCC)                                 Esophagitis, reflux                                          Rhabdomyolysis                                               Vitiligo                                                     Chronic hepatitis C (HCC)                                    Arthritis                                                    Osteoarthrosis                                               Peripheral neuropathy (HCC)                                  Stroke (HCC)                                                 Reproductive/Obstetrics negative OB ROS                             Anesthesia Physical Anesthesia Plan  ASA: III  Anesthesia Plan: General   Post-op Pain Management:    Induction:   Airway Management Planned:   Additional Equipment:   Intra-op Plan:   Post-operative Plan:   Informed Consent: I have reviewed the patients History and Physical, chart, labs and discussed the procedure including the risks, benefits and alternatives for the proposed anesthesia with the patient or authorized representative who has indicated his/her understanding and acceptance.   Dental Advisory Given  Plan Discussed with: Anesthesiologist, CRNA and Surgeon  Anesthesia Plan Comments:         Anesthesia Quick Evaluation

## 2015-05-09 NOTE — Transfer of Care (Signed)
Immediate Anesthesia Transfer of Care Note  Patient: Edward Weber  Procedure(s) Performed: Procedure(s): ESOPHAGOGASTRODUODENOSCOPY (EGD) (N/A) COLONOSCOPY WITH PROPOFOL (N/A)  Patient Location: PACU  Anesthesia Type:General  Level of Consciousness: sedated  Airway & Oxygen Therapy: Patient Spontanous Breathing and Patient connected to nasal cannula oxygen  Post-op Assessment: Report given to RN and Post -op Vital signs reviewed and stable  Post vital signs: Reviewed and stable  Last Vitals:  Filed Vitals:   05/09/15 1238  BP: 163/97  Pulse: 73  Temp: 36.6 C  Resp: 20    Complications: No apparent anesthesia complications

## 2015-05-10 LAB — SURGICAL PATHOLOGY

## 2015-05-10 NOTE — Anesthesia Postprocedure Evaluation (Signed)
Anesthesia Post Note  Patient: Edward Weber  Procedure(s) Performed: Procedure(s) (LRB): ESOPHAGOGASTRODUODENOSCOPY (EGD) (N/A) COLONOSCOPY WITH PROPOFOL (N/A)  Patient location during evaluation: Endoscopy Anesthesia Type: General Level of consciousness: awake and alert Pain management: pain level controlled Vital Signs Assessment: post-procedure vital signs reviewed and stable Respiratory status: spontaneous breathing, nonlabored ventilation, respiratory function stable and patient connected to nasal cannula oxygen Cardiovascular status: blood pressure returned to baseline and stable Postop Assessment: no signs of nausea or vomiting Anesthetic complications: no    Last Vitals:  Filed Vitals:   05/09/15 1354 05/09/15 1404  BP: 147/84 149/93  Pulse: 61 73  Temp:    Resp: 14 22    Last Pain: There were no vitals filed for this visit.               Martha Clan

## 2015-05-11 ENCOUNTER — Encounter: Payer: Self-pay | Admitting: Gastroenterology

## 2015-06-25 ENCOUNTER — Emergency Department
Admission: EM | Admit: 2015-06-25 | Discharge: 2015-06-25 | Disposition: A | Discharge status: Home / Self Care | Payer: Medicare Other | Attending: Emergency Medicine | Admitting: Emergency Medicine

## 2015-06-25 ENCOUNTER — Encounter: Payer: Self-pay | Admitting: Emergency Medicine

## 2015-06-25 DIAGNOSIS — J111 Influenza due to unidentified influenza virus with other respiratory manifestations: Secondary | ICD-10-CM

## 2015-06-25 DIAGNOSIS — Z7952 Long term (current) use of systemic steroids: Secondary | ICD-10-CM | POA: Diagnosis not present

## 2015-06-25 DIAGNOSIS — I10 Essential (primary) hypertension: Secondary | ICD-10-CM | POA: Diagnosis not present

## 2015-06-25 DIAGNOSIS — Z79899 Other long term (current) drug therapy: Secondary | ICD-10-CM | POA: Insufficient documentation

## 2015-06-25 DIAGNOSIS — R05 Cough: Secondary | ICD-10-CM | POA: Diagnosis present

## 2015-06-25 DIAGNOSIS — Z87891 Personal history of nicotine dependence: Secondary | ICD-10-CM | POA: Diagnosis not present

## 2015-06-25 DIAGNOSIS — Z7951 Long term (current) use of inhaled steroids: Secondary | ICD-10-CM | POA: Diagnosis not present

## 2015-06-25 MED ORDER — OSELTAMIVIR PHOSPHATE 75 MG PO CAPS: 75.0000 mg | ORAL_CAPSULE | Freq: Two times a day (BID) | ORAL | Status: DC

## 2015-06-25 MED ORDER — BENZONATATE 100 MG PO CAPS: 200.0000 mg | ORAL_CAPSULE | Freq: Three times a day (TID) | ORAL | Status: DC | PRN
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
## 2015-06-25 NOTE — Discharge Instructions (Signed)

## 2015-06-25 NOTE — ED Notes (Signed)
Reports cough and congestion and bodyaches.  NAD.  Mask applied

## 2015-06-25 NOTE — ED Notes (Signed)
Pt states cough started on Sunday progressing to chills, generalized body aches, dizziness, and diarrhea.  States took tylenol at home today.  A&Ox4 and in NAD at this time.

## 2015-11-19 ENCOUNTER — Emergency Department
Admission: EM | Admit: 2015-11-19 | Discharge: 2015-11-19 | Disposition: A | Discharge status: Home / Self Care | Payer: Medicare Other | Attending: Emergency Medicine | Admitting: Emergency Medicine

## 2015-11-19 ENCOUNTER — Emergency Department: Payer: Medicare Other

## 2015-11-19 DIAGNOSIS — Z87891 Personal history of nicotine dependence: Secondary | ICD-10-CM | POA: Diagnosis not present

## 2015-11-19 DIAGNOSIS — Z79899 Other long term (current) drug therapy: Secondary | ICD-10-CM | POA: Diagnosis not present

## 2015-11-19 DIAGNOSIS — M25561 Pain in right knee: Secondary | ICD-10-CM | POA: Diagnosis present

## 2015-11-19 DIAGNOSIS — I10 Essential (primary) hypertension: Secondary | ICD-10-CM | POA: Diagnosis not present

## 2015-11-19 LAB — CBC WITH DIFFERENTIAL/PLATELET
BASOS ABS: 0.1 10*3/uL (ref 0–0.1)
BASOS PCT: 1 %
EOS ABS: 0.2 10*3/uL (ref 0–0.7)
Eosinophils Relative: 2 %
HCT: 38 % — ABNORMAL LOW (ref 40.0–52.0)
HEMOGLOBIN: 12.9 g/dL — AB (ref 13.0–18.0)
LYMPHS ABS: 2 10*3/uL (ref 1.0–3.6)
Lymphocytes Relative: 29 %
MCH: 33.4 pg (ref 26.0–34.0)
MCHC: 34 g/dL (ref 32.0–36.0)
MCV: 98.4 fL (ref 80.0–100.0)
Monocytes Absolute: 0.7 10*3/uL (ref 0.2–1.0)
Monocytes Relative: 10 %
NEUTROS PCT: 58 %
Neutro Abs: 4 10*3/uL (ref 1.4–6.5)
Platelets: 167 10*3/uL (ref 150–440)
RBC: 3.86 MIL/uL — AB (ref 4.40–5.90)
RDW: 14.2 % (ref 11.5–14.5)
WBC: 6.9 10*3/uL (ref 3.8–10.6)

## 2015-11-19 LAB — SEDIMENTATION RATE: SED RATE: 9 mm/h (ref 0–20)

## 2015-11-19 LAB — URIC ACID: URIC ACID, SERUM: 2.5 mg/dL — AB (ref 4.4–7.6)

## 2015-11-19 MED ORDER — KETOROLAC TROMETHAMINE 60 MG/2ML IM SOLN
60.0000 mg | Freq: Once | INTRAMUSCULAR | Status: AC
  Administered 2015-11-19: 60 mg via INTRAMUSCULAR
  Filled 2015-11-19: qty 2

## 2015-11-19 MED ORDER — TRAMADOL HCL 50 MG PO TABS
50.0000 mg | ORAL_TABLET | Freq: Once | ORAL | Status: AC
  Administered 2015-11-19: 50 mg via ORAL
  Filled 2015-11-19: qty 1

## 2015-11-19 MED ORDER — OXYCODONE-ACETAMINOPHEN 5-325 MG PO TABS: 1.0000 | ORAL_TABLET | Freq: Four times a day (QID) | ORAL | 0 refills | Status: DC | PRN

## 2015-11-19 MED ORDER — MELOXICAM 15 MG PO TABS: 15.0000 mg | ORAL_TABLET | Freq: Every day | ORAL | 0 refills | Status: DC
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
## 2015-11-19 NOTE — ED Triage Notes (Signed)
Pt c/o right knee pain with a hx of gout in the knee.

## 2015-11-19 NOTE — ED Notes (Signed)
States he was at family reunion this weekend  Did a lot of standing and walking  Now having increased pain to right knee  Denies recent injury

## 2015-12-19 ENCOUNTER — Emergency Department: Payer: Medicare Other

## 2015-12-19 ENCOUNTER — Emergency Department
Admission: EM | Admit: 2015-12-19 | Discharge: 2015-12-19 | Disposition: A | Discharge status: Home / Self Care | Payer: Medicare Other | Attending: Student in an Organized Health Care Education/Training Program | Admitting: Student in an Organized Health Care Education/Training Program

## 2015-12-19 DIAGNOSIS — I10 Essential (primary) hypertension: Secondary | ICD-10-CM | POA: Diagnosis not present

## 2015-12-19 DIAGNOSIS — R531 Weakness: Secondary | ICD-10-CM | POA: Diagnosis present

## 2015-12-19 DIAGNOSIS — Z791 Long term (current) use of non-steroidal anti-inflammatories (NSAID): Secondary | ICD-10-CM | POA: Diagnosis not present

## 2015-12-19 DIAGNOSIS — Z79899 Other long term (current) drug therapy: Secondary | ICD-10-CM | POA: Diagnosis not present

## 2015-12-19 DIAGNOSIS — R4781 Slurred speech: Secondary | ICD-10-CM | POA: Insufficient documentation

## 2015-12-19 DIAGNOSIS — Z5181 Encounter for therapeutic drug level monitoring: Secondary | ICD-10-CM | POA: Insufficient documentation

## 2015-12-19 DIAGNOSIS — Z7982 Long term (current) use of aspirin: Secondary | ICD-10-CM | POA: Diagnosis not present

## 2015-12-19 DIAGNOSIS — Z87891 Personal history of nicotine dependence: Secondary | ICD-10-CM | POA: Diagnosis not present

## 2015-12-19 LAB — DIFFERENTIAL
BASOS PCT: 2 %
Basophils Absolute: 0.1 10*3/uL (ref 0–0.1)
Eosinophils Absolute: 0.2 10*3/uL (ref 0–0.7)
Eosinophils Relative: 2 %
LYMPHS PCT: 36 %
Lymphs Abs: 2.6 10*3/uL (ref 1.0–3.6)
MONO ABS: 0.5 10*3/uL (ref 0.2–1.0)
MONOS PCT: 7 %
NEUTROS PCT: 53 %
Neutro Abs: 3.9 10*3/uL (ref 1.4–6.5)

## 2015-12-19 LAB — CBC
HCT: 37.9 % — ABNORMAL LOW (ref 40.0–52.0)
Hemoglobin: 13 g/dL (ref 13.0–18.0)
MCH: 33.5 pg (ref 26.0–34.0)
MCHC: 34.4 g/dL (ref 32.0–36.0)
MCV: 97.6 fL (ref 80.0–100.0)
PLATELETS: 202 10*3/uL (ref 150–440)
RBC: 3.88 MIL/uL — AB (ref 4.40–5.90)
RDW: 15.1 % — ABNORMAL HIGH (ref 11.5–14.5)
WBC: 7.4 10*3/uL (ref 3.8–10.6)

## 2015-12-19 LAB — COMPREHENSIVE METABOLIC PANEL
ALK PHOS: 64 U/L (ref 38–126)
ALT: 43 U/L (ref 17–63)
AST: 48 U/L — AB (ref 15–41)
Albumin: 4.2 g/dL (ref 3.5–5.0)
Anion gap: 8 (ref 5–15)
BILIRUBIN TOTAL: 0.4 mg/dL (ref 0.3–1.2)
BUN: 22 mg/dL — AB (ref 6–20)
CALCIUM: 8.6 mg/dL — AB (ref 8.9–10.3)
CO2: 21 mmol/L — ABNORMAL LOW (ref 22–32)
CREATININE: 1.19 mg/dL (ref 0.61–1.24)
Chloride: 108 mmol/L (ref 101–111)
Glucose, Bld: 109 mg/dL — ABNORMAL HIGH (ref 65–99)
Potassium: 3.6 mmol/L (ref 3.5–5.1)
Sodium: 137 mmol/L (ref 135–145)
TOTAL PROTEIN: 7.1 g/dL (ref 6.5–8.1)

## 2015-12-19 LAB — TROPONIN I

## 2015-12-19 LAB — MAGNESIUM: Magnesium: 1.5 mg/dL — ABNORMAL LOW (ref 1.7–2.4)

## 2015-12-19 LAB — PROTIME-INR
INR: 1.08
PROTHROMBIN TIME: 14 s (ref 11.4–15.2)

## 2015-12-19 LAB — AMMONIA: AMMONIA: 29 umol/L (ref 9–35)

## 2015-12-19 LAB — APTT: aPTT: 31 seconds (ref 24–36)

## 2015-12-19 MED ORDER — SODIUM CHLORIDE 0.9 % IV SOLN
1.0000 g | Freq: Once | INTRAVENOUS | Status: AC
  Administered 2015-12-19: 1 g via INTRAVENOUS

## 2015-12-19 MED ORDER — ASPIRIN 81 MG PO CHEW
324.0000 mg | CHEWABLE_TABLET | Freq: Once | ORAL | Status: AC
  Administered 2015-12-19: 324 mg via ORAL
  Filled 2015-12-19: qty 4

## 2015-12-19 MED ORDER — CALCIUM GLUCONATE 10 % IV SOLN
INTRAVENOUS | Status: AC
  Filled 2015-12-19: qty 10

## 2015-12-19 MED ORDER — ASPIRIN EC 81 MG PO TBEC: 81.0000 mg | DELAYED_RELEASE_TABLET | Freq: Every day | ORAL | 2 refills | Status: AC

## 2015-12-19 MED ORDER — MAGNESIUM SULFATE 2 GM/50ML IV SOLN
2.0000 g | Freq: Once | INTRAVENOUS | Status: AC
  Administered 2015-12-19: 2 g via INTRAVENOUS
  Filled 2015-12-19: qty 50

## 2015-12-19 NOTE — ED Notes (Signed)
AAOx3.  Skin warm and dry.  Moving all extremities equally and strong.  Gait steady and upright. Speech clear.  NAD

## 2015-12-19 NOTE — Discharge Instructions (Signed)
Please take a baby aspirin daily. Please follow-up with neurology as well as her primary care physician. Return for any worsening symptoms.
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
## 2015-12-19 NOTE — ED Triage Notes (Signed)
Pt arrives today with reports of slurred speech that began last night approx 1930  Pt states that he has a history of CVA

## 2015-12-19 NOTE — ED Notes (Signed)
ambulated to CT scan.  Gait steady. NAD

## 2016-01-17 ENCOUNTER — Emergency Department: Payer: Medicare Other

## 2016-01-17 ENCOUNTER — Emergency Department
Admission: EM | Admit: 2016-01-17 | Discharge: 2016-01-17 | Disposition: A | Discharge status: Other Acute Inpatient Hospital | Payer: Medicare Other | Attending: Student in an Organized Health Care Education/Training Program | Admitting: Student in an Organized Health Care Education/Training Program

## 2016-01-17 DIAGNOSIS — Z79899 Other long term (current) drug therapy: Secondary | ICD-10-CM | POA: Insufficient documentation

## 2016-01-17 DIAGNOSIS — Z7982 Long term (current) use of aspirin: Secondary | ICD-10-CM | POA: Insufficient documentation

## 2016-01-17 DIAGNOSIS — Z87891 Personal history of nicotine dependence: Secondary | ICD-10-CM | POA: Insufficient documentation

## 2016-01-17 DIAGNOSIS — Z791 Long term (current) use of non-steroidal anti-inflammatories (NSAID): Secondary | ICD-10-CM | POA: Insufficient documentation

## 2016-01-17 DIAGNOSIS — S065XAA Traumatic subdural hemorrhage with loss of consciousness status unknown, initial encounter: Secondary | ICD-10-CM

## 2016-01-17 DIAGNOSIS — I10 Essential (primary) hypertension: Secondary | ICD-10-CM | POA: Insufficient documentation

## 2016-01-17 DIAGNOSIS — I6200 Nontraumatic subdural hemorrhage, unspecified: Secondary | ICD-10-CM | POA: Insufficient documentation

## 2016-01-17 DIAGNOSIS — R51 Headache: Secondary | ICD-10-CM | POA: Diagnosis present

## 2016-01-17 DIAGNOSIS — R519 Headache, unspecified: Secondary | ICD-10-CM

## 2016-01-17 DIAGNOSIS — S065X9A Traumatic subdural hemorrhage with loss of consciousness of unspecified duration, initial encounter: Secondary | ICD-10-CM

## 2016-01-17 LAB — CBC WITH DIFFERENTIAL/PLATELET
BASOS PCT: 1 %
Basophils Absolute: 0 10*3/uL (ref 0–0.1)
EOS ABS: 0.1 10*3/uL (ref 0–0.7)
EOS PCT: 1 %
HCT: 38.8 % — ABNORMAL LOW (ref 40.0–52.0)
HEMOGLOBIN: 13.6 g/dL (ref 13.0–18.0)
LYMPHS ABS: 3 10*3/uL (ref 1.0–3.6)
Lymphocytes Relative: 33 %
MCH: 34.5 pg — AB (ref 26.0–34.0)
MCHC: 34.9 g/dL (ref 32.0–36.0)
MCV: 98.7 fL (ref 80.0–100.0)
MONO ABS: 0.6 10*3/uL (ref 0.2–1.0)
MONOS PCT: 7 %
NEUTROS PCT: 58 %
Neutro Abs: 5.2 10*3/uL (ref 1.4–6.5)
PLATELETS: 208 10*3/uL (ref 150–440)
RBC: 3.93 MIL/uL — ABNORMAL LOW (ref 4.40–5.90)
RDW: 15.4 % — AB (ref 11.5–14.5)
WBC: 9 10*3/uL (ref 3.8–10.6)

## 2016-01-17 LAB — COMPREHENSIVE METABOLIC PANEL
ALBUMIN: 4.2 g/dL (ref 3.5–5.0)
ALT: 65 U/L — ABNORMAL HIGH (ref 17–63)
ANION GAP: 7 (ref 5–15)
AST: 64 U/L — ABNORMAL HIGH (ref 15–41)
Alkaline Phosphatase: 61 U/L (ref 38–126)
BUN: 23 mg/dL — ABNORMAL HIGH (ref 6–20)
CALCIUM: 9.1 mg/dL (ref 8.9–10.3)
CHLORIDE: 105 mmol/L (ref 101–111)
CO2: 26 mmol/L (ref 22–32)
Creatinine, Ser: 1.22 mg/dL (ref 0.61–1.24)
GFR calc non Af Amer: >60 mL/min (ref >60)
GLUCOSE: 101 mg/dL — AB (ref 65–99)
POTASSIUM: 3.4 mmol/L — AB (ref 3.5–5.1)
SODIUM: 138 mmol/L (ref 135–145)
Total Bilirubin: 0.9 mg/dL (ref 0.3–1.2)
Total Protein: 7.3 g/dL (ref 6.5–8.1)

## 2016-01-17 LAB — PROTIME-INR
INR: 1.02
Prothrombin Time: 13.4 seconds (ref 11.4–15.2)

## 2016-01-17 MED ORDER — DIPHENHYDRAMINE HCL 50 MG/ML IJ SOLN
25.0000 mg | Freq: Once | INTRAMUSCULAR | Status: AC
  Administered 2016-01-17: 25 mg via INTRAVENOUS
  Filled 2016-01-17: qty 1

## 2016-01-17 MED ORDER — PROCHLORPERAZINE EDISYLATE 5 MG/ML IJ SOLN
10.0000 mg | Freq: Once | INTRAMUSCULAR | Status: AC
  Administered 2016-01-17: 10 mg via INTRAVENOUS
  Filled 2016-01-17: qty 2

## 2016-01-17 NOTE — ED Notes (Signed)
Pt awaiting transport to [facility]
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
## 2016-01-17 NOTE — ED Notes (Signed)
Patient is c/o headache and blurry for the past 6 days. Patient denies weakness or recent falls. Patient takes 81 mg ASA every day

## 2016-01-17 NOTE — ED Notes (Signed)
Attempted to call report to [facility], Transfer center could not get nurse on the phone. RN left our number for [facility] nurse to call back and get report

## 2016-01-17 NOTE — ED Triage Notes (Signed)
Pt states that he has been having headaches for the past 6 days, pt reports that he is also dizzy with blurred vision, pt states that he waited til he got to work before he took his bp meds, pt states that he just doesn't feel right. Pt reports feeling very dizzy and states he felt similar 2 years ago with his stroke

## 2016-01-27 ENCOUNTER — Other Ambulatory Visit: Payer: Self-pay | Admitting: Internal Medicine

## 2016-01-27 ENCOUNTER — Ambulatory Visit
Admission: RE | Admit: 2016-01-27 | Discharge: 2016-01-27 | Disposition: A | Discharge status: Home / Self Care | Payer: Medicare Other | Source: Ambulatory Visit | Attending: Internal Medicine | Admitting: Internal Medicine

## 2016-01-27 DIAGNOSIS — R519 Headache, unspecified: Secondary | ICD-10-CM

## 2016-01-27 DIAGNOSIS — S065X9A Traumatic subdural hemorrhage with loss of consciousness of unspecified duration, initial encounter: Secondary | ICD-10-CM | POA: Insufficient documentation

## 2016-01-27 DIAGNOSIS — R51 Headache: Secondary | ICD-10-CM | POA: Insufficient documentation

## 2016-01-27 DIAGNOSIS — Z9889 Other specified postprocedural states: Secondary | ICD-10-CM | POA: Diagnosis not present

## 2016-01-27 DIAGNOSIS — X58XXXA Exposure to other specified factors, initial encounter: Secondary | ICD-10-CM | POA: Insufficient documentation

## 2016-04-11 ENCOUNTER — Emergency Department
Admission: EM | Admit: 2016-04-11 | Discharge: 2016-04-11 | Disposition: A | Discharge status: Home / Self Care | Payer: Medicare Other | Attending: Emergency Medicine | Admitting: Emergency Medicine

## 2016-04-11 DIAGNOSIS — Z7982 Long term (current) use of aspirin: Secondary | ICD-10-CM | POA: Diagnosis not present

## 2016-04-11 DIAGNOSIS — I10 Essential (primary) hypertension: Secondary | ICD-10-CM | POA: Insufficient documentation

## 2016-04-11 DIAGNOSIS — J111 Influenza due to unidentified influenza virus with other respiratory manifestations: Secondary | ICD-10-CM | POA: Diagnosis not present

## 2016-04-11 DIAGNOSIS — Z79899 Other long term (current) drug therapy: Secondary | ICD-10-CM | POA: Insufficient documentation

## 2016-04-11 DIAGNOSIS — Z87891 Personal history of nicotine dependence: Secondary | ICD-10-CM | POA: Diagnosis not present

## 2016-04-11 DIAGNOSIS — R05 Cough: Secondary | ICD-10-CM | POA: Diagnosis present

## 2016-04-11 LAB — INFLUENZA PANEL BY PCR (TYPE A & B)
Influenza A By PCR: NEGATIVE
Influenza B By PCR: NEGATIVE

## 2016-04-11 MED ORDER — OSELTAMIVIR PHOSPHATE 75 MG PO CAPS: 75.0000 mg | ORAL_CAPSULE | Freq: Two times a day (BID) | ORAL | 0 refills | Status: AC

## 2016-04-11 NOTE — ED Notes (Signed)
See triage note  Developed cough and congestion over the past couple of days   No fever  States cough was prod occasional
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
## 2016-04-11 NOTE — ED Triage Notes (Signed)
Pt states that he has been coughing and congested, pt states that he thinks he caught it from someone he works with

## 2016-04-11 NOTE — ED Notes (Signed)
FIRST NURSE NOTE: Pt reports congestion and sinus pain that started last night, has been around others with similar sxs.  Possible fevers.  Coughing worse at night, sore throat.

## 2016-07-20 ENCOUNTER — Other Ambulatory Visit: Payer: Self-pay | Admitting: Gastroenterology

## 2016-07-20 DIAGNOSIS — K7460 Unspecified cirrhosis of liver: Secondary | ICD-10-CM

## 2016-07-23 ENCOUNTER — Ambulatory Visit
Admission: RE | Admit: 2016-07-23 | Discharge: 2016-07-23 | Disposition: A | Discharge status: Home / Self Care | Payer: Medicare Other | Source: Ambulatory Visit | Attending: Gastroenterology | Admitting: Gastroenterology

## 2016-07-23 DIAGNOSIS — K7460 Unspecified cirrhosis of liver: Secondary | ICD-10-CM | POA: Insufficient documentation

## 2016-10-29 ENCOUNTER — Other Ambulatory Visit: Payer: Self-pay | Admitting: Internal Medicine

## 2016-10-29 ENCOUNTER — Ambulatory Visit
Admission: RE | Admit: 2016-10-29 | Discharge: 2016-10-29 | Disposition: A | Discharge status: Home / Self Care | Payer: Medicare Other | Source: Ambulatory Visit | Attending: Internal Medicine | Admitting: Internal Medicine

## 2016-10-29 DIAGNOSIS — R4781 Slurred speech: Secondary | ICD-10-CM

## 2016-10-29 DIAGNOSIS — G319 Degenerative disease of nervous system, unspecified: Secondary | ICD-10-CM | POA: Diagnosis not present

## 2016-10-29 DIAGNOSIS — Z8673 Personal history of transient ischemic attack (TIA), and cerebral infarction without residual deficits: Secondary | ICD-10-CM | POA: Insufficient documentation

## 2016-11-02 ENCOUNTER — Other Ambulatory Visit: Payer: Self-pay | Admitting: Internal Medicine

## 2016-12-08 ENCOUNTER — Emergency Department
Admission: EM | Admit: 2016-12-08 | Discharge: 2016-12-08 | Disposition: A | Discharge status: Home / Self Care | Payer: Medicare Other | Attending: Emergency Medicine | Admitting: Emergency Medicine

## 2016-12-08 ENCOUNTER — Encounter: Payer: Self-pay | Admitting: Emergency Medicine

## 2016-12-08 DIAGNOSIS — Y929 Unspecified place or not applicable: Secondary | ICD-10-CM | POA: Insufficient documentation

## 2016-12-08 DIAGNOSIS — Z79899 Other long term (current) drug therapy: Secondary | ICD-10-CM | POA: Diagnosis not present

## 2016-12-08 DIAGNOSIS — I10 Essential (primary) hypertension: Secondary | ICD-10-CM | POA: Insufficient documentation

## 2016-12-08 DIAGNOSIS — Z7982 Long term (current) use of aspirin: Secondary | ICD-10-CM | POA: Insufficient documentation

## 2016-12-08 DIAGNOSIS — S199XXA Unspecified injury of neck, initial encounter: Secondary | ICD-10-CM | POA: Diagnosis present

## 2016-12-08 DIAGNOSIS — Y999 Unspecified external cause status: Secondary | ICD-10-CM | POA: Diagnosis not present

## 2016-12-08 DIAGNOSIS — Z87891 Personal history of nicotine dependence: Secondary | ICD-10-CM | POA: Insufficient documentation

## 2016-12-08 DIAGNOSIS — X58XXXA Exposure to other specified factors, initial encounter: Secondary | ICD-10-CM | POA: Insufficient documentation

## 2016-12-08 DIAGNOSIS — S161XXA Strain of muscle, fascia and tendon at neck level, initial encounter: Secondary | ICD-10-CM

## 2016-12-08 DIAGNOSIS — Y939 Activity, unspecified: Secondary | ICD-10-CM | POA: Diagnosis not present

## 2016-12-08 MED ORDER — ACETAMINOPHEN 325 MG PO TABS
650.0000 mg | ORAL_TABLET | Freq: Once | ORAL | Status: AC
  Administered 2016-12-08: 650 mg via ORAL
  Filled 2016-12-08: qty 2

## 2016-12-08 MED ORDER — NAPROXEN 500 MG PO TABS: 500.0000 mg | ORAL_TABLET | Freq: Two times a day (BID) | ORAL | 2 refills | Status: DC

## 2016-12-08 MED ORDER — KETOROLAC TROMETHAMINE 30 MG/ML IJ SOLN
30.0000 mg | Freq: Once | INTRAMUSCULAR | Status: AC
  Administered 2016-12-08: 30 mg via INTRAMUSCULAR
  Filled 2016-12-08: qty 1
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
## 2016-12-08 NOTE — ED Notes (Signed)
Pt to ed with c/o left shoulder pain, worse with movement, reproducible by palpation.  Pt denies chest pain, denies sob.

## 2016-12-08 NOTE — ED Triage Notes (Addendum)
Pt c/o aching pain to left shoulder for 3 days. Pain is intermittent. Pt denies injury to area. Denies SHOB.  Pain sometimes worse when moves arm but is also present when not doing anything.  Tried motrin without relief. Pain radiates to back per pt

## 2017-03-16 ENCOUNTER — Other Ambulatory Visit: Payer: Self-pay | Admitting: Gastroenterology

## 2017-03-16 DIAGNOSIS — K7460 Unspecified cirrhosis of liver: Secondary | ICD-10-CM

## 2017-03-22 ENCOUNTER — Ambulatory Visit: Admission: RE | Admit: 2017-03-22 | Payer: Medicare Other | Source: Ambulatory Visit

## 2017-03-26 ENCOUNTER — Ambulatory Visit
Admission: RE | Admit: 2017-03-26 | Discharge: 2017-03-26 | Disposition: A | Discharge status: Home / Self Care | Payer: Medicare Other | Source: Ambulatory Visit | Attending: Gastroenterology | Admitting: Gastroenterology

## 2017-03-26 DIAGNOSIS — N200 Calculus of kidney: Secondary | ICD-10-CM | POA: Diagnosis not present

## 2017-03-26 DIAGNOSIS — I700 Atherosclerosis of aorta: Secondary | ICD-10-CM | POA: Insufficient documentation

## 2017-03-26 DIAGNOSIS — K7460 Unspecified cirrhosis of liver: Secondary | ICD-10-CM | POA: Insufficient documentation

## 2017-05-03 ENCOUNTER — Encounter: Payer: Self-pay | Admitting: *Deleted

## 2017-05-04 ENCOUNTER — Encounter
Admission: RE | Disposition: A | Discharge status: Home / Self Care | Payer: Self-pay | Source: Ambulatory Visit | Attending: Internal Medicine

## 2017-05-04 ENCOUNTER — Ambulatory Visit
Admission: RE | Admit: 2017-05-04 | Discharge: 2017-05-04 | Disposition: A | Discharge status: Home / Self Care | Payer: Medicare Other | Source: Ambulatory Visit | Attending: Internal Medicine | Admitting: Internal Medicine

## 2017-05-04 ENCOUNTER — Encounter: Payer: Self-pay | Admitting: *Deleted

## 2017-05-04 ENCOUNTER — Ambulatory Visit: Payer: Medicare Other | Admitting: Anesthesiology

## 2017-05-04 DIAGNOSIS — L80 Vitiligo: Secondary | ICD-10-CM | POA: Diagnosis not present

## 2017-05-04 DIAGNOSIS — Z7951 Long term (current) use of inhaled steroids: Secondary | ICD-10-CM | POA: Diagnosis not present

## 2017-05-04 DIAGNOSIS — M6282 Rhabdomyolysis: Secondary | ICD-10-CM | POA: Diagnosis not present

## 2017-05-04 DIAGNOSIS — Z8673 Personal history of transient ischemic attack (TIA), and cerebral infarction without residual deficits: Secondary | ICD-10-CM | POA: Insufficient documentation

## 2017-05-04 DIAGNOSIS — K766 Portal hypertension: Secondary | ICD-10-CM | POA: Diagnosis present

## 2017-05-04 DIAGNOSIS — D649 Anemia, unspecified: Secondary | ICD-10-CM | POA: Diagnosis not present

## 2017-05-04 DIAGNOSIS — Z79899 Other long term (current) drug therapy: Secondary | ICD-10-CM | POA: Diagnosis not present

## 2017-05-04 DIAGNOSIS — M1990 Unspecified osteoarthritis, unspecified site: Secondary | ICD-10-CM | POA: Diagnosis not present

## 2017-05-04 DIAGNOSIS — K449 Diaphragmatic hernia without obstruction or gangrene: Secondary | ICD-10-CM | POA: Diagnosis not present

## 2017-05-04 DIAGNOSIS — K210 Gastro-esophageal reflux disease with esophagitis: Secondary | ICD-10-CM | POA: Insufficient documentation

## 2017-05-04 DIAGNOSIS — F329 Major depressive disorder, single episode, unspecified: Secondary | ICD-10-CM | POA: Insufficient documentation

## 2017-05-04 DIAGNOSIS — K222 Esophageal obstruction: Secondary | ICD-10-CM | POA: Insufficient documentation

## 2017-05-04 DIAGNOSIS — G629 Polyneuropathy, unspecified: Secondary | ICD-10-CM | POA: Diagnosis not present

## 2017-05-04 DIAGNOSIS — J309 Allergic rhinitis, unspecified: Secondary | ICD-10-CM | POA: Insufficient documentation

## 2017-05-04 DIAGNOSIS — Z8619 Personal history of other infectious and parasitic diseases: Secondary | ICD-10-CM | POA: Insufficient documentation

## 2017-05-04 DIAGNOSIS — R51 Headache: Secondary | ICD-10-CM | POA: Diagnosis not present

## 2017-05-04 DIAGNOSIS — K2950 Unspecified chronic gastritis without bleeding: Secondary | ICD-10-CM | POA: Diagnosis not present

## 2017-05-04 DIAGNOSIS — B182 Chronic viral hepatitis C: Secondary | ICD-10-CM | POA: Insufficient documentation

## 2017-05-04 DIAGNOSIS — K3189 Other diseases of stomach and duodenum: Secondary | ICD-10-CM | POA: Diagnosis not present

## 2017-05-04 DIAGNOSIS — G709 Myoneural disorder, unspecified: Secondary | ICD-10-CM | POA: Insufficient documentation

## 2017-05-04 DIAGNOSIS — F1020 Alcohol dependence, uncomplicated: Secondary | ICD-10-CM | POA: Insufficient documentation

## 2017-05-04 HISTORY — PX: ESOPHAGOGASTRODUODENOSCOPY (EGD) WITH PROPOFOL: SHX5813

## 2017-05-04 LAB — CBC WITH DIFFERENTIAL/PLATELET
BASOS PCT: 1 %
Basophils Absolute: 0.1 10*3/uL (ref 0–0.1)
Eosinophils Absolute: 0.2 10*3/uL (ref 0–0.7)
Eosinophils Relative: 2 %
HEMATOCRIT: 40.2 % (ref 40.0–52.0)
Hemoglobin: 13.5 g/dL (ref 13.0–18.0)
Lymphocytes Relative: 29 %
Lymphs Abs: 2.3 10*3/uL (ref 1.0–3.6)
MCH: 32.7 pg (ref 26.0–34.0)
MCHC: 33.5 g/dL (ref 32.0–36.0)
MCV: 97.6 fL (ref 80.0–100.0)
MONO ABS: 0.7 10*3/uL (ref 0.2–1.0)
MONOS PCT: 9 %
NEUTROS ABS: 4.8 10*3/uL (ref 1.4–6.5)
Neutrophils Relative %: 59 %
Platelets: 240 10*3/uL (ref 150–440)
RBC: 4.12 MIL/uL — ABNORMAL LOW (ref 4.40–5.90)
RDW: 13.7 % (ref 11.5–14.5)
WBC: 8.1 10*3/uL (ref 3.8–10.6)

## 2017-05-04 LAB — PROTIME-INR
INR: 1.02
Prothrombin Time: 13.3 seconds (ref 11.4–15.2)

## 2017-05-04 SURGERY — ESOPHAGOGASTRODUODENOSCOPY (EGD) WITH PROPOFOL
Anesthesia: General

## 2017-05-04 MED ORDER — LIDOCAINE HCL (PF) 2 % IJ SOLN
INTRAMUSCULAR | Status: AC
  Filled 2017-05-04: qty 10

## 2017-05-04 MED ORDER — PROPOFOL 500 MG/50ML IV EMUL
INTRAVENOUS | Status: DC | PRN
  Administered 2017-05-04: 140 ug/kg/min via INTRAVENOUS

## 2017-05-04 MED ORDER — PROPOFOL 10 MG/ML IV BOLUS
INTRAVENOUS | Status: DC | PRN
  Administered 2017-05-04: 40 mg via INTRAVENOUS
  Administered 2017-05-04: 90 mg via INTRAVENOUS

## 2017-05-04 MED ORDER — PROPOFOL 500 MG/50ML IV EMUL
INTRAVENOUS | Status: AC
  Filled 2017-05-04: qty 50

## 2017-05-04 MED ORDER — SODIUM CHLORIDE 0.9 % IV SOLN
INTRAVENOUS | Status: DC
  Administered 2017-05-04: 1000 mL via INTRAVENOUS

## 2017-05-04 MED ORDER — LIDOCAINE HCL (CARDIAC) 20 MG/ML IV SOLN
INTRAVENOUS | Status: DC | PRN
  Administered 2017-05-04: 50 mg via INTRAVENOUS

## 2017-05-04 NOTE — Op Note (Signed)
[facility] Gastroenterology Patient Name: Edward Weber Procedure Date: 05/04/2017 2:21 PM MRN: [id number] Account #: [id number] Date of Birth: 19-Jun-1960 Admit Type: Outpatient Age: 57 Room: [facility] ENDO ROOM 4 Gender: Male Note Status: Finalized Procedure:            Upper GI endoscopy Indications:          Surveillance procedure, Portal hypertension with                        suspected esophageal varices Providers:            Benay Pike. Alice Reichert MD, MD Referring MD:         Glendon Axe (Referring MD) Medicines:            Propofol per Anesthesia Complications:        No immediate complications. Procedure:            Pre-Anesthesia Assessment:                       - The risks and benefits of the procedure and the                        sedation options and risks were discussed with the                        patient. All questions were answered and informed                        consent was obtained.                       - Patient identification and proposed procedure were                        verified prior to the procedure by the nurse. The                        procedure was verified in the procedure room.                       - ASA Grade Assessment: III - A patient with severe                        systemic disease.                       - After reviewing the risks and benefits, the patient                        was deemed in satisfactory condition to undergo the                        procedure.                       After obtaining informed consent, the endoscope was                        passed under direct vision. Throughout the procedure,  the patient's blood pressure, pulse, and oxygen                        saturations were monitored continuously. The Endoscope                        was introduced through the mouth, and advanced to the                        third part of duodenum. The upper GI endoscopy was               accomplished without difficulty. The patient tolerated                        the procedure well. Findings:      LA Grade C (one or more mucosal breaks continuous between tops of 2 or       more mucosal folds, less than 75% circumference) esophagitis with no       bleeding was found 35 cm from the incisors.      A non-obstructing Schatzki ring (acquired) was found in the lower third       of the esophagus.      A small hiatal hernia was present.      Localized mild inflammation characterized by erythema was found in the       gastric antrum.      The exam was otherwise without abnormality.      Localized mild mucosal changes characterized by atrophy and erosion were       found in the first portion of the duodenum. Impression:           - LA Grade C reflux esophagitis.                       - Non-obstructing Schatzki ring.                       - Small hiatal hernia.                       - Chronic gastritis.                       - The examination was otherwise normal.                       - Mucosal changes in the duodenum.                       - No specimens collected. Recommendation:       - Patient has a contact number available for                        emergencies. The signs and symptoms of potential                        delayed complications were discussed with the patient.                        Return to normal activities tomorrow. Written discharge                        instructions  were provided to the patient.                       - Resume previous diet.                       - Continue present medications.                       - Repeat upper endoscopy in 2 years for surveillance.                       - Return to nurse practitioner as previously scheduled. Procedure Code(s):    --- Professional ---                       [phone], Esophagogastroduodenoscopy, flexible, transoral;                        diagnostic, including collection of specimen(s) by                         brushing or washing, when performed (separate procedure) Diagnosis Code(s):    --- Professional ---                       K76.6, Portal hypertension                       K31.89, Other diseases of stomach and duodenum                       K29.50, Unspecified chronic gastritis without bleeding                       K44.9, Diaphragmatic hernia without obstruction or                        gangrene                       K22.2, Esophageal obstruction                       K21.0, Gastro-esophageal reflux disease with esophagitis CPT copyright 2016 American Medical Association. All rights reserved. The codes documented in this report are preliminary and upon coder review may  be revised to meet current compliance requirements. [name] MD, MD 05/04/2017 2:42:35 PM This report has been signed electronically. Number of Addenda: 0 Note Initiated On: 05/04/2017 2:21 PM      [facility]

## 2017-05-04 NOTE — Anesthesia Preprocedure Evaluation (Signed)
Anesthesia Evaluation  Patient identified by MRN, date of birth, ID band Patient awake    Reviewed: Allergy & Precautions, H&P , NPO status , Patient's Chart, lab work & pertinent test results, reviewed documented beta blocker date and time   History of Anesthesia Complications Negative for: history of anesthetic complications  Airway Mallampati: II  TM Distance: >3 FB Neck ROM: full    Dental  (+) Teeth Intact   Pulmonary neg shortness of breath, neg sleep apnea, neg COPD, neg recent URI, former smoker,    Pulmonary exam normal breath sounds clear to auscultation       Cardiovascular Exercise Tolerance: Good hypertension, (-) angina+ Peripheral Vascular Disease  (-) CAD, (-) Past MI, (-) Cardiac Stents and (-) CABG Normal cardiovascular exam(-) dysrhythmias (-) Valvular Problems/Murmurs Rhythm:regular Rate:Normal     Neuro/Psych  Headaches, neg Seizures PSYCHIATRIC DISORDERS (Depression and alcoholism) Depression  Neuromuscular disease (Neuropathy) CVA, No Residual Symptoms    GI/Hepatic GERD  ,(+) Hepatitis - (s/p treatment), C  Endo/Other  negative endocrine ROS  Renal/GU negative Renal ROS  negative genitourinary   Musculoskeletal  (+) Arthritis , Osteoarthritis,    Abdominal   Peds negative pediatric ROS (+)  Hematology  (+) Blood dyscrasia, anemia ,   Anesthesia Other Findings Past Medical History:   Hypertension                                                 GERD (gastroesophageal reflux disease)                       Depression                                                   Hepatitis                                                    Stroke (HCC)                                                 Alcoholism (HCC)                                             Allergic rhinitis                                            Allergic state                                               Anemia  Helicobacter pylori gastritis                                Neuromuscular disorder (HCC)                                 Esophagitis, reflux                                          Rhabdomyolysis                                               Vitiligo                                                     Chronic hepatitis C (HCC)                                    Arthritis                                                    Osteoarthrosis                                               Peripheral neuropathy (HCC)                                  Stroke (HCC)                                                 Reproductive/Obstetrics negative OB ROS                             Anesthesia Physical  Anesthesia Plan  ASA: III  Anesthesia Plan: General   Post-op Pain Management:    Induction: Intravenous  PONV Risk Score and Plan:   Airway Management Planned: Nasal Cannula  Additional Equipment:   Intra-op Plan:   Post-operative Plan:   Informed Consent: I have reviewed the patients History and Physical, chart, labs and discussed the procedure including the risks, benefits and alternatives for the proposed anesthesia with the patient or authorized representative who has indicated his/her understanding and acceptance.   Dental Advisory Given  Plan Discussed with: Anesthesiologist, CRNA and Surgeon  Anesthesia Plan Comments:         Anesthesia Quick Evaluation

## 2017-05-04 NOTE — Transfer of Care (Signed)
Immediate Anesthesia Transfer of Care Note  Patient: Edward Weber  Procedure(s) Performed: ESOPHAGOGASTRODUODENOSCOPY (EGD) WITH PROPOFOL (N/A )  Patient Location: PACU and Endoscopy Unit  Anesthesia Type:General  Level of Consciousness: drowsy  Airway & Oxygen Therapy: Patient Spontanous Breathing and Patient connected to nasal cannula oxygen  Post-op Assessment: Report given to RN and Post -op Vital signs reviewed and stable  Post vital signs: Reviewed and stable  Last Vitals:  Vitals:   05/04/17 1307  BP: (!) 158/84  Pulse: 60  Resp: 20  Temp: 36.6 C  SpO2: 100%    Last Pain:  Vitals:   05/04/17 1307  TempSrc: Tympanic      Patients Stated Pain Goal: 0 (95/18/84 1660)  Complications: No apparent anesthesia complications

## 2017-05-04 NOTE — Anesthesia Postprocedure Evaluation (Signed)
Anesthesia Post Note  Patient: Edward Weber  Procedure(s) Performed: ESOPHAGOGASTRODUODENOSCOPY (EGD) WITH PROPOFOL (N/A )  Patient location during evaluation: PACU Anesthesia Type: General Level of consciousness: awake and alert and oriented Pain management: pain level controlled Vital Signs Assessment: post-procedure vital signs reviewed and stable Respiratory status: spontaneous breathing Cardiovascular status: blood pressure returned to baseline Anesthetic complications: no     Last Vitals:  Vitals:   05/04/17 1307 05/04/17 1440  BP: (!) 158/84 125/76  Pulse: 60 73  Resp: 20 20  Temp: 36.6 C (!) 36.4 C  SpO2: 100% 96%    Last Pain:  Vitals:   05/04/17 1440  TempSrc:   PainSc: Asleep                 Genieve Ramaswamy

## 2017-05-04 NOTE — Interval H&P Note (Signed)
History and Physical Interval Note:  05/04/2017 2:20 PM  Edward Weber  has presented today for surgery, with the diagnosis of GERD,CIRRHOSIS  The various methods of treatment have been discussed with the patient and family. After consideration of risks, benefits and other options for treatment, the patient has consented to  Procedure(s): ESOPHAGOGASTRODUODENOSCOPY (EGD) WITH PROPOFOL (N/A) as a surgical intervention .  The patient's history has been reviewed, patient examined, no change in status, stable for surgery.  I have reviewed the patient's chart and labs.  Questions were answered to the patient's satisfaction.     [place], [place]

## 2017-05-04 NOTE — H&P (Signed)
Outpatient short stay form Pre-procedure 05/04/2017 2:18 PM Edward Weber, M.D.  Primary Physician: Glendon Axe, M.D.  Reason for visit:  Cirrhosis, surveillance for esophageal varices  History of present illness:  57 y/o male presents for surveillance for esophageal varices. Patient is s/p sustained remission for Hepatitis C with [place] therapy. Patient denies melena, hemetemesis, abdominal pain. Is undergoing [place] surveillance with [place] which has been negative.     Current Facility-Administered Medications:  .  0.9 %  sodium chloride infusion, , Intravenous, Continuous, [place], Benay Pike, MD, Last Rate: 20 mL/hr at 05/04/17 1401, 1,000 mL at 05/04/17 1401  Medications Prior to Admission  Medication Sig Dispense Refill Last Dose  . albuterol (PROVENTIL HFA;VENTOLIN HFA) 108 (90 Base) MCG/ACT inhaler Inhale into the lungs every 6 (six) hours as needed for wheezing or shortness of breath.   Past Week at Unknown time  . allopurinol (ZYLOPRIM) 100 MG tablet Take 100 mg by mouth 2 (two) times daily.   Past Week at Unknown time  . amLODipine-atorvastatin (CADUET) 5-10 MG tablet Take 1 tablet by mouth daily.   05/04/2017 at Unknown time  . benzonatate (TESSALON PERLES) 100 MG capsule Take 2 capsules (200 mg total) by mouth 3 (three) times daily as needed for cough. 30 capsule 0 Past Week at Unknown time  . clobetasol cream (TEMOVATE) 6.96 % Apply 1 application topically 2 (two) times daily.   Past Week at Unknown time  . colchicine 0.6 MG tablet Take 1 tablet (0.6 mg total) by mouth 2 (two) times daily. 60 tablet 1 Past Week at Unknown time  . cyanocobalamin 1000 MCG tablet Take 1,000 mcg by mouth daily.   Past Week at Unknown time  . doxepin (SINEQUAN) 25 MG capsule Take 1 capsule (25 mg total) by mouth at bedtime. 30 capsule 1 Past Week at Unknown time  . DULoxetine (CYMBALTA) 60 MG capsule Take 1 capsule (60 mg total) by mouth daily. 30 capsule 1 Past Week at Unknown time  . escitalopram  (LEXAPRO) 20 MG tablet Take 20 mg by mouth daily.   Past Week at Unknown time  . ferrous gluconate (FERGON) 324 MG tablet Take 324 mg by mouth daily with breakfast.   Past Week at Unknown time  . fluticasone (FLONASE) 50 MCG/ACT nasal spray Place 2 sprays into both nostrils daily.   Past Week at Unknown time  . gabapentin (NEURONTIN) 300 MG capsule Take 1 capsule (300 mg total) by mouth 2 (two) times daily. 60 capsule 1 Past Week at Unknown time  . hydrochlorothiazide (HYDRODIURIL) 25 MG tablet Take 1 tablet (25 mg total) by mouth daily with breakfast. 30 tablet 1 Past Week at Unknown time  . hydrocortisone cream 1 % Apply 1 application topically daily as needed for itching.   Past Week at Unknown time  . hydrOXYzine (ATARAX/VISTARIL) 25 MG tablet Take 25 mg by mouth 3 (three) times daily as needed. For itching   Past Week at Unknown time  . lisinopril-hydrochlorothiazide (PRINZIDE,ZESTORETIC) 20-25 MG tablet Take 1 tablet by mouth daily.   Past Week at Unknown time  . meloxicam (MOBIC) 15 MG tablet Take 1 tablet (15 mg total) by mouth daily. 30 tablet 0 Past Week at Unknown time  . Multiple Vitamin (MULTIVITAMIN) tablet Take 1 tablet by mouth daily.   Past Week at Unknown time  . naproxen (NAPROSYN) 500 MG tablet Take 1 tablet (500 mg total) by mouth 2 (two) times daily with a meal. 20 tablet 2 Past Week at  Unknown time  . predniSONE (DELTASONE) 20 MG tablet Take 20 mg by mouth daily with breakfast.   Past Week at Unknown time  . ranitidine (ZANTAC) 150 MG capsule Take 150 mg by mouth 2 (two) times daily.   Past Week at Unknown time  . senna-docusate (SENOKOT-S) 8.6-50 MG per tablet Take 1 tablet by mouth 2 (two) times daily as needed for mild constipation.   Past Week at Unknown time  . sucralfate (CARAFATE) 1 G tablet Take 1 tablet (1 g total) by mouth 4 (four) times daily. 60 tablet 0 Past Week at Unknown time  . thiamine 100 MG tablet Take 100 mg by mouth daily.   Past Week at Unknown time  .  tiZANidine (ZANAFLEX) 4 MG capsule Take 4 mg by mouth 3 (three) times daily.   Past Week at Unknown time  . famotidine (PEPCID) 20 MG tablet Take 20 mg by mouth 2 (two) times daily.     . lansoprazole (PREVACID) 15 MG capsule Take 15 mg by mouth daily as needed. For acid reflux   04/30/2013 at Unknown time  . oxyCODONE-acetaminophen (ROXICET) 5-325 MG per tablet Take 1-2 tablets by mouth every 4 (four) hours as needed (Pain). (Patient not taking: Reported on 05/04/2017) 60 tablet 0 Not Taking at Unknown time  . oxyCODONE-acetaminophen (ROXICET) 5-325 MG tablet Take 1 tablet by mouth every 6 (six) hours as needed for moderate pain. (Patient not taking: Reported on 05/04/2017) 12 tablet 0 Completed Course at Unknown time  . pantoprazole (PROTONIX) 40 MG tablet Take 1 tablet (40 mg total) by mouth daily. 30 tablet 1      Allergies  Allergen Reactions  . Beef-Derived Products     Personal preference  . Lactalbumin   . Milk-Related Compounds Nausea And Vomiting    Intoloerance     Past Medical History:  Diagnosis Date  . Alcoholism ([place])   . Allergic rhinitis   . Allergic state   . Anemia   . Arthritis   . Chronic hepatitis C ([place])   . Depression   . Esophagitis, reflux   . GERD (gastroesophageal reflux disease)   . Helicobacter pylori gastritis   . Hepatitis   . Hypertension   . Neuromuscular disorder ([place])   . Osteoarthrosis   . Peripheral neuropathy   . Rhabdomyolysis   . Stroke ([place])   . Stroke ([place])   . Vitiligo     Review of systems:      Physical Exam  General appearance: alert, cooperative and appears stated age Resp: clear to auscultation bilaterally and normal percussion bilaterally Cardio: regular rate and rhythm, S1, S2 normal, no murmur, click, rub or gallop GI: soft, non-tender; bowel sounds normal; no masses,  no organomegaly Extremities: extremities normal, atraumatic, no cyanosis or edema     Planned procedures: Proceed with EGD. The patient  understands the nature of the planned procedure, indications, risks, alternatives and potential complications including but not limited to bleeding, infection, perforation, damage to internal organs and possible oversedation/side effects from anesthesia. The patient agrees and gives consent to proceed.  Please refer to procedure notes for findings, recommendations and patient disposition/instructions.    Edward Weber, M.D. Gastroenterology 05/04/2017  2:18 PM

## 2017-05-04 NOTE — Anesthesia Post-op Follow-up Note (Signed)
Anesthesia QCDR form completed.        

## 2017-05-05 ENCOUNTER — Encounter: Payer: Self-pay | Admitting: Internal Medicine

## 2017-08-12 ENCOUNTER — Ambulatory Visit
Admission: RE | Admit: 2017-08-12 | Discharge: 2017-08-12 | Disposition: A | Discharge status: Home / Self Care | Payer: Self-pay | Source: Ambulatory Visit | Attending: Family Medicine | Admitting: Family Medicine

## 2017-08-12 ENCOUNTER — Other Ambulatory Visit (HOSPITAL_COMMUNITY): Payer: Self-pay | Admitting: Family Medicine

## 2017-08-12 DIAGNOSIS — R7612 Nonspecific reaction to cell mediated immunity measurement of gamma interferon antigen response without active tuberculosis: Secondary | ICD-10-CM

## 2018-03-01 ENCOUNTER — Ambulatory Visit: Payer: Medicare Other | Admitting: Speech Pathology

## 2018-03-04 ENCOUNTER — Ambulatory Visit: Payer: Medicare Other | Admitting: Speech Pathology

## 2018-03-08 ENCOUNTER — Ambulatory Visit: Payer: Medicare Other | Attending: Internal Medicine | Admitting: Speech Pathology

## 2018-03-10 ENCOUNTER — Emergency Department: Payer: Medicare Other

## 2018-03-10 ENCOUNTER — Other Ambulatory Visit: Payer: Self-pay

## 2018-03-10 ENCOUNTER — Emergency Department
Admission: EM | Admit: 2018-03-10 | Discharge: 2018-03-10 | Disposition: A | Discharge status: Home / Self Care | Payer: Medicare Other | Attending: Emergency Medicine | Admitting: Emergency Medicine

## 2018-03-10 ENCOUNTER — Encounter: Payer: Self-pay | Admitting: Emergency Medicine

## 2018-03-10 DIAGNOSIS — F10929 Alcohol use, unspecified with intoxication, unspecified: Secondary | ICD-10-CM

## 2018-03-10 DIAGNOSIS — R000 Tachycardia, unspecified: Secondary | ICD-10-CM | POA: Diagnosis not present

## 2018-03-10 DIAGNOSIS — R270 Ataxia, unspecified: Secondary | ICD-10-CM | POA: Insufficient documentation

## 2018-03-10 DIAGNOSIS — S4991XA Unspecified injury of right shoulder and upper arm, initial encounter: Secondary | ICD-10-CM | POA: Diagnosis present

## 2018-03-10 DIAGNOSIS — F10129 Alcohol abuse with intoxication, unspecified: Secondary | ICD-10-CM | POA: Diagnosis not present

## 2018-03-10 DIAGNOSIS — S40011A Contusion of right shoulder, initial encounter: Secondary | ICD-10-CM

## 2018-03-10 DIAGNOSIS — Z79899 Other long term (current) drug therapy: Secondary | ICD-10-CM | POA: Diagnosis not present

## 2018-03-10 DIAGNOSIS — R45850 Homicidal ideations: Secondary | ICD-10-CM

## 2018-03-10 DIAGNOSIS — F1410 Cocaine abuse, uncomplicated: Secondary | ICD-10-CM | POA: Diagnosis not present

## 2018-03-10 DIAGNOSIS — Y929 Unspecified place or not applicable: Secondary | ICD-10-CM | POA: Insufficient documentation

## 2018-03-10 DIAGNOSIS — I10 Essential (primary) hypertension: Secondary | ICD-10-CM | POA: Insufficient documentation

## 2018-03-10 DIAGNOSIS — Y998 Other external cause status: Secondary | ICD-10-CM | POA: Diagnosis not present

## 2018-03-10 DIAGNOSIS — F329 Major depressive disorder, single episode, unspecified: Secondary | ICD-10-CM | POA: Insufficient documentation

## 2018-03-10 DIAGNOSIS — F17210 Nicotine dependence, cigarettes, uncomplicated: Secondary | ICD-10-CM | POA: Diagnosis not present

## 2018-03-10 DIAGNOSIS — Y9389 Activity, other specified: Secondary | ICD-10-CM | POA: Diagnosis not present

## 2018-03-10 LAB — COMPREHENSIVE METABOLIC PANEL
ALT: 23 U/L (ref 0–44)
ANION GAP: 11 (ref 5–15)
AST: 30 U/L (ref 15–41)
Albumin: 4.1 g/dL (ref 3.5–5.0)
Alkaline Phosphatase: 54 U/L (ref 38–126)
BUN: 10 mg/dL (ref 6–20)
CO2: 24 mmol/L (ref 22–32)
Calcium: 8.6 mg/dL — ABNORMAL LOW (ref 8.9–10.3)
Chloride: 106 mmol/L (ref 98–111)
Creatinine, Ser: 0.86 mg/dL (ref 0.61–1.24)
GFR calc Af Amer: >60 mL/min (ref >60)
GFR calc non Af Amer: >60 mL/min (ref >60)
Glucose, Bld: 94 mg/dL (ref 70–99)
Potassium: 3.5 mmol/L (ref 3.5–5.1)
Sodium: 141 mmol/L (ref 135–145)
Total Bilirubin: 0.6 mg/dL (ref 0.3–1.2)
Total Protein: 7.1 g/dL (ref 6.5–8.1)

## 2018-03-10 LAB — ETHANOL: Alcohol, Ethyl (B): 177 mg/dL — ABNORMAL HIGH (ref <10)

## 2018-03-10 LAB — CBC WITH DIFFERENTIAL/PLATELET
Abs Immature Granulocytes: 0.03 10*3/uL (ref 0.00–0.07)
Basophils Absolute: 0.1 10*3/uL (ref 0.0–0.1)
Basophils Relative: 1 %
Eosinophils Absolute: 0.2 10*3/uL (ref 0.0–0.5)
Eosinophils Relative: 3 %
HCT: 37.9 % — ABNORMAL LOW (ref 39.0–52.0)
Hemoglobin: 12.7 g/dL — ABNORMAL LOW (ref 13.0–17.0)
IMMATURE GRANULOCYTES: 0 %
Lymphocytes Relative: 35 %
Lymphs Abs: 2.8 10*3/uL (ref 0.7–4.0)
MCH: 32.2 pg (ref 26.0–34.0)
MCHC: 33.5 g/dL (ref 30.0–36.0)
MCV: 95.9 fL (ref 80.0–100.0)
Monocytes Absolute: 0.6 10*3/uL (ref 0.1–1.0)
Monocytes Relative: 8 %
NEUTROS PCT: 53 %
Neutro Abs: 4.2 10*3/uL (ref 1.7–7.7)
Platelets: 247 10*3/uL (ref 150–400)
RBC: 3.95 MIL/uL — ABNORMAL LOW (ref 4.22–5.81)
RDW: 13.7 % (ref 11.5–15.5)
WBC: 7.9 10*3/uL (ref 4.0–10.5)
nRBC: 0 % (ref 0.0–0.2)

## 2018-03-10 LAB — ACETAMINOPHEN LEVEL: Acetaminophen (Tylenol), Serum: <10 ug/mL — ABNORMAL LOW (ref 10–30)

## 2018-03-10 LAB — SALICYLATE LEVEL: Salicylate Lvl: <7 mg/dL (ref 2.8–30.0)

## 2018-03-10 LAB — CK: Total CK: 314 U/L (ref 49–397)

## 2018-03-10 MED ORDER — IOPAMIDOL (ISOVUE-300) INJECTION 61%
100.0000 mL | Freq: Once | INTRAVENOUS | Status: AC | PRN
  Administered 2018-03-10: 100 mL via INTRAVENOUS

## 2018-03-10 MED ORDER — LORAZEPAM 2 MG/ML IJ SOLN
2.0000 mg | Freq: Once | INTRAMUSCULAR | Status: AC
  Administered 2018-03-10: 2 mg via INTRAVENOUS
  Filled 2018-03-10: qty 1

## 2018-03-10 MED ORDER — SODIUM CHLORIDE 0.9 % IV BOLUS
1000.0000 mL | Freq: Once | INTRAVENOUS | Status: AC
  Administered 2018-03-10: 1000 mL via INTRAVENOUS

## 2018-03-10 MED ORDER — HALOPERIDOL LACTATE 5 MG/ML IJ SOLN
5.0000 mg | Freq: Once | INTRAMUSCULAR | Status: AC
  Administered 2018-03-10: 5 mg via INTRAVENOUS
  Filled 2018-03-10: qty 1

## 2018-03-10 NOTE — ED Provider Notes (Signed)
Cleared for discharge by psychiatry [facility]   Lavonia Drafts, MD 03/10/18 1115
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
## 2018-03-10 NOTE — ED Notes (Signed)
Patient sitting up in bed speaking with SOC.
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
## 2018-03-14 ENCOUNTER — Emergency Department: Payer: Medicare Other

## 2018-03-14 ENCOUNTER — Encounter: Payer: Self-pay | Admitting: Emergency Medicine

## 2018-03-14 ENCOUNTER — Other Ambulatory Visit: Payer: Self-pay

## 2018-03-14 ENCOUNTER — Emergency Department
Admission: EM | Admit: 2018-03-14 | Discharge: 2018-03-14 | Disposition: A | Discharge status: Home / Self Care | Payer: Medicare Other | Attending: Emergency Medicine | Admitting: Emergency Medicine

## 2018-03-14 DIAGNOSIS — R0789 Other chest pain: Secondary | ICD-10-CM

## 2018-03-14 DIAGNOSIS — Z79899 Other long term (current) drug therapy: Secondary | ICD-10-CM | POA: Diagnosis not present

## 2018-03-14 DIAGNOSIS — M109 Gout, unspecified: Secondary | ICD-10-CM

## 2018-03-14 DIAGNOSIS — W19XXXA Unspecified fall, initial encounter: Secondary | ICD-10-CM

## 2018-03-14 DIAGNOSIS — F17210 Nicotine dependence, cigarettes, uncomplicated: Secondary | ICD-10-CM | POA: Insufficient documentation

## 2018-03-14 DIAGNOSIS — I10 Essential (primary) hypertension: Secondary | ICD-10-CM | POA: Diagnosis not present

## 2018-03-14 LAB — BASIC METABOLIC PANEL
Anion gap: 8 (ref 5–15)
BUN: 8 mg/dL (ref 6–20)
CO2: 25 mmol/L (ref 22–32)
Calcium: 8.7 mg/dL — ABNORMAL LOW (ref 8.9–10.3)
Chloride: 107 mmol/L (ref 98–111)
Creatinine, Ser: 0.97 mg/dL (ref 0.61–1.24)
GFR calc Af Amer: >60 mL/min (ref >60)
GFR calc non Af Amer: >60 mL/min (ref >60)
Glucose, Bld: 88 mg/dL (ref 70–99)
Potassium: 3.2 mmol/L — ABNORMAL LOW (ref 3.5–5.1)
Sodium: 140 mmol/L (ref 135–145)

## 2018-03-14 LAB — CBC
HCT: 38.5 % — ABNORMAL LOW (ref 39.0–52.0)
Hemoglobin: 13 g/dL (ref 13.0–17.0)
MCH: 32.4 pg (ref 26.0–34.0)
MCHC: 33.8 g/dL (ref 30.0–36.0)
MCV: 96 fL (ref 80.0–100.0)
PLATELETS: 273 10*3/uL (ref 150–400)
RBC: 4.01 MIL/uL — ABNORMAL LOW (ref 4.22–5.81)
RDW: 13.5 % (ref 11.5–15.5)
WBC: 7.5 10*3/uL (ref 4.0–10.5)
nRBC: 0 % (ref 0.0–0.2)

## 2018-03-14 LAB — TROPONIN I: Troponin I: <0.03 ng/mL (ref <0.03)

## 2018-03-14 MED ORDER — KETOROLAC TROMETHAMINE 30 MG/ML IJ SOLN
30.0000 mg | Freq: Once | INTRAMUSCULAR | Status: AC
  Administered 2018-03-14: 30 mg via INTRAMUSCULAR
  Filled 2018-03-14: qty 1

## 2018-03-14 MED ORDER — DEXAMETHASONE SODIUM PHOSPHATE 10 MG/ML IJ SOLN
10.0000 mg | Freq: Once | INTRAMUSCULAR | Status: AC
  Administered 2018-03-14: 10 mg via INTRAMUSCULAR
  Filled 2018-03-14: qty 1

## 2018-03-14 MED ORDER — NAPROXEN 500 MG PO TABS: 500.0000 mg | ORAL_TABLET | Freq: Two times a day (BID) | ORAL | 2 refills | Status: DC
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
## 2018-03-14 NOTE — ED Triage Notes (Signed)
Pt presents from home via pov with chest pain, presumably due to a bruise from falling in the shower this morning. Pt has gout in his left leg and thinks that is what caused him to fall. States he has "just a little" shortness of breath. Denies n/v. Pt alert & oriented with NAD noted.

## 2018-03-15 ENCOUNTER — Ambulatory Visit: Payer: Medicare Other | Admitting: Speech Pathology

## 2018-03-18 ENCOUNTER — Ambulatory Visit: Payer: Medicare Other | Admitting: Speech Pathology

## 2018-03-22 ENCOUNTER — Ambulatory Visit: Payer: Medicare Other | Admitting: Speech Pathology

## 2018-03-24 ENCOUNTER — Emergency Department
Admission: EM | Admit: 2018-03-24 | Discharge: 2018-03-24 | Disposition: A | Discharge status: Other Acute Inpatient Hospital | Payer: Medicare Other | Attending: Student in an Organized Health Care Education/Training Program | Admitting: Student in an Organized Health Care Education/Training Program

## 2018-03-24 ENCOUNTER — Emergency Department: Payer: Medicare Other

## 2018-03-24 ENCOUNTER — Other Ambulatory Visit: Payer: Self-pay

## 2018-03-24 DIAGNOSIS — Z8673 Personal history of transient ischemic attack (TIA), and cerebral infarction without residual deficits: Secondary | ICD-10-CM | POA: Diagnosis not present

## 2018-03-24 DIAGNOSIS — F17210 Nicotine dependence, cigarettes, uncomplicated: Secondary | ICD-10-CM | POA: Diagnosis not present

## 2018-03-24 DIAGNOSIS — G9520 Unspecified cord compression: Secondary | ICD-10-CM | POA: Insufficient documentation

## 2018-03-24 DIAGNOSIS — Z79899 Other long term (current) drug therapy: Secondary | ICD-10-CM | POA: Insufficient documentation

## 2018-03-24 DIAGNOSIS — I10 Essential (primary) hypertension: Secondary | ICD-10-CM | POA: Diagnosis not present

## 2018-03-24 DIAGNOSIS — W19XXXA Unspecified fall, initial encounter: Secondary | ICD-10-CM | POA: Insufficient documentation

## 2018-03-24 DIAGNOSIS — R531 Weakness: Secondary | ICD-10-CM | POA: Diagnosis present

## 2018-03-24 LAB — CBC
HCT: 41.4 % (ref 39.0–52.0)
Hemoglobin: 13.5 g/dL (ref 13.0–17.0)
MCH: 32.3 pg (ref 26.0–34.0)
MCHC: 32.6 g/dL (ref 30.0–36.0)
MCV: 99 fL (ref 80.0–100.0)
Platelets: 250 10*3/uL (ref 150–400)
RBC: 4.18 MIL/uL — ABNORMAL LOW (ref 4.22–5.81)
RDW: 13.7 % (ref 11.5–15.5)
WBC: 9.1 10*3/uL (ref 4.0–10.5)
nRBC: 0 % (ref 0.0–0.2)

## 2018-03-24 LAB — BASIC METABOLIC PANEL
Anion gap: 10 (ref 5–15)
BUN: 16 mg/dL (ref 6–20)
CO2: 17 mmol/L — ABNORMAL LOW (ref 22–32)
Calcium: 8.5 mg/dL — ABNORMAL LOW (ref 8.9–10.3)
Chloride: 110 mmol/L (ref 98–111)
Creatinine, Ser: 1.17 mg/dL (ref 0.61–1.24)
GFR calc Af Amer: >60 mL/min (ref >60)
GFR calc non Af Amer: >60 mL/min (ref >60)
Glucose, Bld: 140 mg/dL — ABNORMAL HIGH (ref 70–99)
Potassium: 3.6 mmol/L (ref 3.5–5.1)
Sodium: 137 mmol/L (ref 135–145)

## 2018-03-24 MED ORDER — DEXAMETHASONE SODIUM PHOSPHATE 10 MG/ML IJ SOLN
10.0000 mg | Freq: Once | INTRAMUSCULAR | Status: AC
  Administered 2018-03-24: 10 mg via INTRAVENOUS
  Filled 2018-03-24: qty 1

## 2018-03-24 MED ORDER — SODIUM CHLORIDE 0.9 % IV BOLUS
500.0000 mL | Freq: Once | INTRAVENOUS | Status: AC
  Administered 2018-03-24: 500 mL via INTRAVENOUS

## 2018-03-24 NOTE — ED Notes (Addendum)
FIRST NURSE NOTE:  Pt reports generalized weakness and bilateral hand tingling for 3 days. Pt placed in wheelchair on arrival to ED.

## 2018-03-24 NOTE — ED Triage Notes (Addendum)
Pt arrives to ED for tingling in fingers x 3 days. States weakness in legs. States fell a few times yesterday. A&O, speaking in complete sentences, clear speech. Moving all extremities on own. Denies blood thinner use. No droop or drift noted.
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
## 2018-03-24 NOTE — ED Notes (Signed)
c-collar applied as ordered.

## 2018-03-24 NOTE — ED Notes (Signed)
EMTALA reviewed by this RN.  

## 2018-03-25 ENCOUNTER — Ambulatory Visit: Payer: Medicare Other | Admitting: Speech Pathology

## 2018-03-29 ENCOUNTER — Ambulatory Visit: Payer: Medicare Other | Admitting: Speech Pathology

## 2018-04-01 ENCOUNTER — Ambulatory Visit: Payer: Medicare Other | Admitting: Speech Pathology

## 2018-04-05 ENCOUNTER — Ambulatory Visit: Payer: Medicare Other | Admitting: Speech Pathology

## 2018-04-08 ENCOUNTER — Ambulatory Visit: Payer: Medicare Other | Admitting: Speech Pathology

## 2018-04-12 ENCOUNTER — Ambulatory Visit: Payer: Medicare Other | Admitting: Speech Pathology

## 2018-04-13 DIAGNOSIS — G9529 Other cord compression: Secondary | ICD-10-CM | POA: Diagnosis not present

## 2018-04-13 DIAGNOSIS — K219 Gastro-esophageal reflux disease without esophagitis: Secondary | ICD-10-CM | POA: Diagnosis not present

## 2018-04-13 DIAGNOSIS — M4322 Fusion of spine, cervical region: Secondary | ICD-10-CM | POA: Diagnosis not present

## 2018-04-13 DIAGNOSIS — F320 Major depressive disorder, single episode, mild: Secondary | ICD-10-CM | POA: Diagnosis not present

## 2018-04-13 DIAGNOSIS — I10 Essential (primary) hypertension: Secondary | ICD-10-CM | POA: Diagnosis not present

## 2018-04-13 DIAGNOSIS — M109 Gout, unspecified: Secondary | ICD-10-CM | POA: Diagnosis not present

## 2018-04-13 DIAGNOSIS — M4802 Spinal stenosis, cervical region: Secondary | ICD-10-CM | POA: Diagnosis not present

## 2018-04-13 DIAGNOSIS — M6281 Muscle weakness (generalized): Secondary | ICD-10-CM | POA: Diagnosis not present

## 2018-04-13 DIAGNOSIS — K5790 Diverticulosis of intestine, part unspecified, without perforation or abscess without bleeding: Secondary | ICD-10-CM | POA: Diagnosis not present

## 2018-04-13 DIAGNOSIS — I6200 Nontraumatic subdural hemorrhage, unspecified: Secondary | ICD-10-CM | POA: Diagnosis not present

## 2018-04-13 DIAGNOSIS — R52 Pain, unspecified: Secondary | ICD-10-CM | POA: Diagnosis not present

## 2018-04-13 DIAGNOSIS — M1990 Unspecified osteoarthritis, unspecified site: Secondary | ICD-10-CM | POA: Diagnosis not present

## 2018-04-15 ENCOUNTER — Ambulatory Visit: Payer: Medicare Other | Admitting: Speech Pathology

## 2018-04-18 ENCOUNTER — Ambulatory Visit: Payer: Medicare Other | Admitting: Speech Pathology

## 2018-04-19 DIAGNOSIS — G9529 Other cord compression: Secondary | ICD-10-CM | POA: Diagnosis not present

## 2018-04-19 DIAGNOSIS — K219 Gastro-esophageal reflux disease without esophagitis: Secondary | ICD-10-CM | POA: Diagnosis not present

## 2018-04-19 DIAGNOSIS — F320 Major depressive disorder, single episode, mild: Secondary | ICD-10-CM | POA: Diagnosis not present

## 2018-04-19 DIAGNOSIS — I10 Essential (primary) hypertension: Secondary | ICD-10-CM | POA: Diagnosis not present

## 2018-04-20 ENCOUNTER — Ambulatory Visit: Payer: Medicare Other | Admitting: Speech Pathology

## 2018-04-20 DIAGNOSIS — K5790 Diverticulosis of intestine, part unspecified, without perforation or abscess without bleeding: Secondary | ICD-10-CM | POA: Diagnosis not present

## 2018-04-20 DIAGNOSIS — D519 Vitamin B12 deficiency anemia, unspecified: Secondary | ICD-10-CM | POA: Diagnosis not present

## 2018-04-20 DIAGNOSIS — M4802 Spinal stenosis, cervical region: Secondary | ICD-10-CM | POA: Diagnosis not present

## 2018-04-20 DIAGNOSIS — I10 Essential (primary) hypertension: Secondary | ICD-10-CM | POA: Diagnosis not present

## 2018-04-20 DIAGNOSIS — G9009 Other idiopathic peripheral autonomic neuropathy: Secondary | ICD-10-CM | POA: Diagnosis not present

## 2018-04-20 DIAGNOSIS — M1990 Unspecified osteoarthritis, unspecified site: Secondary | ICD-10-CM | POA: Diagnosis not present

## 2018-04-20 DIAGNOSIS — F329 Major depressive disorder, single episode, unspecified: Secondary | ICD-10-CM | POA: Diagnosis not present

## 2018-04-20 DIAGNOSIS — M4712 Other spondylosis with myelopathy, cervical region: Secondary | ICD-10-CM | POA: Diagnosis not present

## 2018-04-20 DIAGNOSIS — Z4789 Encounter for other orthopedic aftercare: Secondary | ICD-10-CM | POA: Diagnosis not present

## 2018-04-21 DIAGNOSIS — M4712 Other spondylosis with myelopathy, cervical region: Secondary | ICD-10-CM | POA: Diagnosis not present

## 2018-04-21 DIAGNOSIS — G9009 Other idiopathic peripheral autonomic neuropathy: Secondary | ICD-10-CM | POA: Diagnosis not present

## 2018-04-21 DIAGNOSIS — K5790 Diverticulosis of intestine, part unspecified, without perforation or abscess without bleeding: Secondary | ICD-10-CM | POA: Diagnosis not present

## 2018-04-21 DIAGNOSIS — F329 Major depressive disorder, single episode, unspecified: Secondary | ICD-10-CM | POA: Diagnosis not present

## 2018-04-21 DIAGNOSIS — D519 Vitamin B12 deficiency anemia, unspecified: Secondary | ICD-10-CM | POA: Diagnosis not present

## 2018-04-21 DIAGNOSIS — M1990 Unspecified osteoarthritis, unspecified site: Secondary | ICD-10-CM | POA: Diagnosis not present

## 2018-04-21 DIAGNOSIS — Z4789 Encounter for other orthopedic aftercare: Secondary | ICD-10-CM | POA: Diagnosis not present

## 2018-04-21 DIAGNOSIS — I10 Essential (primary) hypertension: Secondary | ICD-10-CM | POA: Diagnosis not present

## 2018-04-21 DIAGNOSIS — M4802 Spinal stenosis, cervical region: Secondary | ICD-10-CM | POA: Diagnosis not present

## 2018-04-25 ENCOUNTER — Ambulatory Visit: Payer: Medicare Other | Admitting: Speech Pathology

## 2018-04-25 DIAGNOSIS — K219 Gastro-esophageal reflux disease without esophagitis: Secondary | ICD-10-CM | POA: Diagnosis not present

## 2018-04-25 DIAGNOSIS — M1000 Idiopathic gout, unspecified site: Secondary | ICD-10-CM | POA: Diagnosis not present

## 2018-04-25 DIAGNOSIS — I10 Essential (primary) hypertension: Secondary | ICD-10-CM | POA: Diagnosis not present

## 2018-04-25 DIAGNOSIS — F325 Major depressive disorder, single episode, in full remission: Secondary | ICD-10-CM | POA: Diagnosis not present

## 2018-04-26 DIAGNOSIS — M4802 Spinal stenosis, cervical region: Secondary | ICD-10-CM | POA: Diagnosis not present

## 2018-04-26 DIAGNOSIS — M1990 Unspecified osteoarthritis, unspecified site: Secondary | ICD-10-CM | POA: Diagnosis not present

## 2018-04-26 DIAGNOSIS — I10 Essential (primary) hypertension: Secondary | ICD-10-CM | POA: Diagnosis not present

## 2018-04-26 DIAGNOSIS — K5790 Diverticulosis of intestine, part unspecified, without perforation or abscess without bleeding: Secondary | ICD-10-CM | POA: Diagnosis not present

## 2018-04-26 DIAGNOSIS — G9009 Other idiopathic peripheral autonomic neuropathy: Secondary | ICD-10-CM | POA: Diagnosis not present

## 2018-04-26 DIAGNOSIS — F329 Major depressive disorder, single episode, unspecified: Secondary | ICD-10-CM | POA: Diagnosis not present

## 2018-04-26 DIAGNOSIS — M4712 Other spondylosis with myelopathy, cervical region: Secondary | ICD-10-CM | POA: Diagnosis not present

## 2018-04-26 DIAGNOSIS — Z4789 Encounter for other orthopedic aftercare: Secondary | ICD-10-CM | POA: Diagnosis not present

## 2018-04-26 DIAGNOSIS — D519 Vitamin B12 deficiency anemia, unspecified: Secondary | ICD-10-CM | POA: Diagnosis not present

## 2018-04-27 ENCOUNTER — Ambulatory Visit: Payer: Medicare Other | Admitting: Speech Pathology

## 2018-04-28 DIAGNOSIS — Z4789 Encounter for other orthopedic aftercare: Secondary | ICD-10-CM | POA: Diagnosis not present

## 2018-04-28 DIAGNOSIS — F329 Major depressive disorder, single episode, unspecified: Secondary | ICD-10-CM | POA: Diagnosis not present

## 2018-04-28 DIAGNOSIS — K5790 Diverticulosis of intestine, part unspecified, without perforation or abscess without bleeding: Secondary | ICD-10-CM | POA: Diagnosis not present

## 2018-04-28 DIAGNOSIS — M1990 Unspecified osteoarthritis, unspecified site: Secondary | ICD-10-CM | POA: Diagnosis not present

## 2018-04-28 DIAGNOSIS — D519 Vitamin B12 deficiency anemia, unspecified: Secondary | ICD-10-CM | POA: Diagnosis not present

## 2018-04-28 DIAGNOSIS — G9009 Other idiopathic peripheral autonomic neuropathy: Secondary | ICD-10-CM | POA: Diagnosis not present

## 2018-04-28 DIAGNOSIS — M4712 Other spondylosis with myelopathy, cervical region: Secondary | ICD-10-CM | POA: Diagnosis not present

## 2018-04-28 DIAGNOSIS — I10 Essential (primary) hypertension: Secondary | ICD-10-CM | POA: Diagnosis not present

## 2018-04-28 DIAGNOSIS — M4802 Spinal stenosis, cervical region: Secondary | ICD-10-CM | POA: Diagnosis not present

## 2018-04-29 DIAGNOSIS — D519 Vitamin B12 deficiency anemia, unspecified: Secondary | ICD-10-CM | POA: Diagnosis not present

## 2018-04-29 DIAGNOSIS — G9009 Other idiopathic peripheral autonomic neuropathy: Secondary | ICD-10-CM | POA: Diagnosis not present

## 2018-04-29 DIAGNOSIS — M4712 Other spondylosis with myelopathy, cervical region: Secondary | ICD-10-CM | POA: Diagnosis not present

## 2018-04-29 DIAGNOSIS — I10 Essential (primary) hypertension: Secondary | ICD-10-CM | POA: Diagnosis not present

## 2018-04-29 DIAGNOSIS — K5790 Diverticulosis of intestine, part unspecified, without perforation or abscess without bleeding: Secondary | ICD-10-CM | POA: Diagnosis not present

## 2018-04-29 DIAGNOSIS — M1990 Unspecified osteoarthritis, unspecified site: Secondary | ICD-10-CM | POA: Diagnosis not present

## 2018-04-29 DIAGNOSIS — Z4789 Encounter for other orthopedic aftercare: Secondary | ICD-10-CM | POA: Diagnosis not present

## 2018-04-29 DIAGNOSIS — M4802 Spinal stenosis, cervical region: Secondary | ICD-10-CM | POA: Diagnosis not present

## 2018-04-29 DIAGNOSIS — F329 Major depressive disorder, single episode, unspecified: Secondary | ICD-10-CM | POA: Diagnosis not present

## 2018-05-02 DIAGNOSIS — Z4789 Encounter for other orthopedic aftercare: Secondary | ICD-10-CM | POA: Diagnosis not present

## 2018-05-02 DIAGNOSIS — M4802 Spinal stenosis, cervical region: Secondary | ICD-10-CM | POA: Diagnosis not present

## 2018-05-02 DIAGNOSIS — M4712 Other spondylosis with myelopathy, cervical region: Secondary | ICD-10-CM | POA: Diagnosis not present

## 2018-05-02 DIAGNOSIS — D519 Vitamin B12 deficiency anemia, unspecified: Secondary | ICD-10-CM | POA: Diagnosis not present

## 2018-05-02 DIAGNOSIS — K5790 Diverticulosis of intestine, part unspecified, without perforation or abscess without bleeding: Secondary | ICD-10-CM | POA: Diagnosis not present

## 2018-05-02 DIAGNOSIS — G9009 Other idiopathic peripheral autonomic neuropathy: Secondary | ICD-10-CM | POA: Diagnosis not present

## 2018-05-02 DIAGNOSIS — I10 Essential (primary) hypertension: Secondary | ICD-10-CM | POA: Diagnosis not present

## 2018-05-02 DIAGNOSIS — F329 Major depressive disorder, single episode, unspecified: Secondary | ICD-10-CM | POA: Diagnosis not present

## 2018-05-02 DIAGNOSIS — M1990 Unspecified osteoarthritis, unspecified site: Secondary | ICD-10-CM | POA: Diagnosis not present

## 2018-05-03 DIAGNOSIS — D519 Vitamin B12 deficiency anemia, unspecified: Secondary | ICD-10-CM | POA: Diagnosis not present

## 2018-05-03 DIAGNOSIS — M4712 Other spondylosis with myelopathy, cervical region: Secondary | ICD-10-CM | POA: Diagnosis not present

## 2018-05-03 DIAGNOSIS — F329 Major depressive disorder, single episode, unspecified: Secondary | ICD-10-CM | POA: Diagnosis not present

## 2018-05-03 DIAGNOSIS — Z4789 Encounter for other orthopedic aftercare: Secondary | ICD-10-CM | POA: Diagnosis not present

## 2018-05-03 DIAGNOSIS — M1990 Unspecified osteoarthritis, unspecified site: Secondary | ICD-10-CM | POA: Diagnosis not present

## 2018-05-03 DIAGNOSIS — G9009 Other idiopathic peripheral autonomic neuropathy: Secondary | ICD-10-CM | POA: Diagnosis not present

## 2018-05-03 DIAGNOSIS — M4802 Spinal stenosis, cervical region: Secondary | ICD-10-CM | POA: Diagnosis not present

## 2018-05-03 DIAGNOSIS — I10 Essential (primary) hypertension: Secondary | ICD-10-CM | POA: Diagnosis not present

## 2018-05-03 DIAGNOSIS — K5790 Diverticulosis of intestine, part unspecified, without perforation or abscess without bleeding: Secondary | ICD-10-CM | POA: Diagnosis not present

## 2018-05-05 DIAGNOSIS — M4712 Other spondylosis with myelopathy, cervical region: Secondary | ICD-10-CM | POA: Diagnosis not present

## 2018-05-05 DIAGNOSIS — K5790 Diverticulosis of intestine, part unspecified, without perforation or abscess without bleeding: Secondary | ICD-10-CM | POA: Diagnosis not present

## 2018-05-05 DIAGNOSIS — D519 Vitamin B12 deficiency anemia, unspecified: Secondary | ICD-10-CM | POA: Diagnosis not present

## 2018-05-05 DIAGNOSIS — Z4789 Encounter for other orthopedic aftercare: Secondary | ICD-10-CM | POA: Diagnosis not present

## 2018-05-05 DIAGNOSIS — I10 Essential (primary) hypertension: Secondary | ICD-10-CM | POA: Diagnosis not present

## 2018-05-05 DIAGNOSIS — M1990 Unspecified osteoarthritis, unspecified site: Secondary | ICD-10-CM | POA: Diagnosis not present

## 2018-05-05 DIAGNOSIS — M4802 Spinal stenosis, cervical region: Secondary | ICD-10-CM | POA: Diagnosis not present

## 2018-05-05 DIAGNOSIS — F329 Major depressive disorder, single episode, unspecified: Secondary | ICD-10-CM | POA: Diagnosis not present

## 2018-05-05 DIAGNOSIS — G9009 Other idiopathic peripheral autonomic neuropathy: Secondary | ICD-10-CM | POA: Diagnosis not present

## 2018-05-06 DIAGNOSIS — G9009 Other idiopathic peripheral autonomic neuropathy: Secondary | ICD-10-CM | POA: Diagnosis not present

## 2018-05-06 DIAGNOSIS — M1990 Unspecified osteoarthritis, unspecified site: Secondary | ICD-10-CM | POA: Diagnosis not present

## 2018-05-06 DIAGNOSIS — I10 Essential (primary) hypertension: Secondary | ICD-10-CM | POA: Diagnosis not present

## 2018-05-06 DIAGNOSIS — M4712 Other spondylosis with myelopathy, cervical region: Secondary | ICD-10-CM | POA: Diagnosis not present

## 2018-05-06 DIAGNOSIS — Z4789 Encounter for other orthopedic aftercare: Secondary | ICD-10-CM | POA: Diagnosis not present

## 2018-05-06 DIAGNOSIS — K5790 Diverticulosis of intestine, part unspecified, without perforation or abscess without bleeding: Secondary | ICD-10-CM | POA: Diagnosis not present

## 2018-05-06 DIAGNOSIS — D519 Vitamin B12 deficiency anemia, unspecified: Secondary | ICD-10-CM | POA: Diagnosis not present

## 2018-05-06 DIAGNOSIS — M4802 Spinal stenosis, cervical region: Secondary | ICD-10-CM | POA: Diagnosis not present

## 2018-05-11 DIAGNOSIS — M4712 Other spondylosis with myelopathy, cervical region: Secondary | ICD-10-CM | POA: Diagnosis not present

## 2018-05-11 DIAGNOSIS — K5790 Diverticulosis of intestine, part unspecified, without perforation or abscess without bleeding: Secondary | ICD-10-CM | POA: Diagnosis not present

## 2018-05-11 DIAGNOSIS — D519 Vitamin B12 deficiency anemia, unspecified: Secondary | ICD-10-CM | POA: Diagnosis not present

## 2018-05-11 DIAGNOSIS — F329 Major depressive disorder, single episode, unspecified: Secondary | ICD-10-CM | POA: Diagnosis not present

## 2018-05-11 DIAGNOSIS — G9009 Other idiopathic peripheral autonomic neuropathy: Secondary | ICD-10-CM | POA: Diagnosis not present

## 2018-05-11 DIAGNOSIS — Z4789 Encounter for other orthopedic aftercare: Secondary | ICD-10-CM | POA: Diagnosis not present

## 2018-05-11 DIAGNOSIS — I10 Essential (primary) hypertension: Secondary | ICD-10-CM | POA: Diagnosis not present

## 2018-05-11 DIAGNOSIS — M1990 Unspecified osteoarthritis, unspecified site: Secondary | ICD-10-CM | POA: Diagnosis not present

## 2018-05-11 DIAGNOSIS — M4802 Spinal stenosis, cervical region: Secondary | ICD-10-CM | POA: Diagnosis not present

## 2018-05-12 DIAGNOSIS — K5790 Diverticulosis of intestine, part unspecified, without perforation or abscess without bleeding: Secondary | ICD-10-CM | POA: Diagnosis not present

## 2018-05-12 DIAGNOSIS — M4802 Spinal stenosis, cervical region: Secondary | ICD-10-CM | POA: Diagnosis not present

## 2018-05-12 DIAGNOSIS — I10 Essential (primary) hypertension: Secondary | ICD-10-CM | POA: Diagnosis not present

## 2018-05-12 DIAGNOSIS — F329 Major depressive disorder, single episode, unspecified: Secondary | ICD-10-CM | POA: Diagnosis not present

## 2018-05-12 DIAGNOSIS — G9009 Other idiopathic peripheral autonomic neuropathy: Secondary | ICD-10-CM | POA: Diagnosis not present

## 2018-05-12 DIAGNOSIS — M4712 Other spondylosis with myelopathy, cervical region: Secondary | ICD-10-CM | POA: Diagnosis not present

## 2018-05-12 DIAGNOSIS — M1990 Unspecified osteoarthritis, unspecified site: Secondary | ICD-10-CM | POA: Diagnosis not present

## 2018-05-12 DIAGNOSIS — Z4789 Encounter for other orthopedic aftercare: Secondary | ICD-10-CM | POA: Diagnosis not present

## 2018-05-12 DIAGNOSIS — D519 Vitamin B12 deficiency anemia, unspecified: Secondary | ICD-10-CM | POA: Diagnosis not present

## 2018-05-18 DIAGNOSIS — M542 Cervicalgia: Secondary | ICD-10-CM | POA: Diagnosis not present

## 2018-05-18 DIAGNOSIS — M4322 Fusion of spine, cervical region: Secondary | ICD-10-CM | POA: Diagnosis not present

## 2018-05-19 DIAGNOSIS — K5790 Diverticulosis of intestine, part unspecified, without perforation or abscess without bleeding: Secondary | ICD-10-CM | POA: Diagnosis not present

## 2018-05-19 DIAGNOSIS — Z4789 Encounter for other orthopedic aftercare: Secondary | ICD-10-CM | POA: Diagnosis not present

## 2018-05-19 DIAGNOSIS — G9009 Other idiopathic peripheral autonomic neuropathy: Secondary | ICD-10-CM | POA: Diagnosis not present

## 2018-05-19 DIAGNOSIS — M4802 Spinal stenosis, cervical region: Secondary | ICD-10-CM | POA: Diagnosis not present

## 2018-05-19 DIAGNOSIS — F329 Major depressive disorder, single episode, unspecified: Secondary | ICD-10-CM | POA: Diagnosis not present

## 2018-05-19 DIAGNOSIS — M1990 Unspecified osteoarthritis, unspecified site: Secondary | ICD-10-CM | POA: Diagnosis not present

## 2018-05-19 DIAGNOSIS — M4712 Other spondylosis with myelopathy, cervical region: Secondary | ICD-10-CM | POA: Diagnosis not present

## 2018-05-19 DIAGNOSIS — D519 Vitamin B12 deficiency anemia, unspecified: Secondary | ICD-10-CM | POA: Diagnosis not present

## 2018-05-19 DIAGNOSIS — I10 Essential (primary) hypertension: Secondary | ICD-10-CM | POA: Diagnosis not present

## 2018-10-07 DIAGNOSIS — M79642 Pain in left hand: Secondary | ICD-10-CM | POA: Diagnosis not present

## 2018-10-07 DIAGNOSIS — F3341 Major depressive disorder, recurrent, in partial remission: Secondary | ICD-10-CM | POA: Diagnosis not present

## 2018-10-07 DIAGNOSIS — E538 Deficiency of other specified B group vitamins: Secondary | ICD-10-CM | POA: Diagnosis not present

## 2018-10-07 DIAGNOSIS — Z0000 Encounter for general adult medical examination without abnormal findings: Secondary | ICD-10-CM | POA: Diagnosis not present

## 2018-10-07 DIAGNOSIS — F325 Major depressive disorder, single episode, in full remission: Secondary | ICD-10-CM | POA: Diagnosis not present

## 2018-10-07 DIAGNOSIS — I10 Essential (primary) hypertension: Secondary | ICD-10-CM | POA: Diagnosis not present

## 2018-10-07 DIAGNOSIS — I639 Cerebral infarction, unspecified: Secondary | ICD-10-CM | POA: Diagnosis not present

## 2018-10-07 DIAGNOSIS — B182 Chronic viral hepatitis C: Secondary | ICD-10-CM | POA: Diagnosis not present

## 2018-12-13 DIAGNOSIS — H52223 Regular astigmatism, bilateral: Secondary | ICD-10-CM | POA: Diagnosis not present

## 2018-12-13 DIAGNOSIS — H5213 Myopia, bilateral: Secondary | ICD-10-CM | POA: Diagnosis not present

## 2018-12-13 DIAGNOSIS — H524 Presbyopia: Secondary | ICD-10-CM | POA: Diagnosis not present

## 2019-02-10 DIAGNOSIS — Z23 Encounter for immunization: Secondary | ICD-10-CM | POA: Diagnosis not present

## 2019-04-26 DIAGNOSIS — R202 Paresthesia of skin: Secondary | ICD-10-CM | POA: Diagnosis not present

## 2019-04-26 DIAGNOSIS — Z125 Encounter for screening for malignant neoplasm of prostate: Secondary | ICD-10-CM | POA: Diagnosis not present

## 2019-04-26 DIAGNOSIS — I10 Essential (primary) hypertension: Secondary | ICD-10-CM | POA: Diagnosis not present

## 2019-04-26 DIAGNOSIS — R200 Anesthesia of skin: Secondary | ICD-10-CM | POA: Diagnosis not present

## 2019-04-26 DIAGNOSIS — F17210 Nicotine dependence, cigarettes, uncomplicated: Secondary | ICD-10-CM | POA: Diagnosis not present

## 2019-04-26 DIAGNOSIS — Z0001 Encounter for general adult medical examination with abnormal findings: Secondary | ICD-10-CM | POA: Diagnosis not present

## 2019-04-26 DIAGNOSIS — B182 Chronic viral hepatitis C: Secondary | ICD-10-CM | POA: Diagnosis not present

## 2019-05-05 DIAGNOSIS — R202 Paresthesia of skin: Secondary | ICD-10-CM | POA: Diagnosis not present

## 2019-05-05 DIAGNOSIS — R200 Anesthesia of skin: Secondary | ICD-10-CM | POA: Diagnosis not present

## 2019-05-08 ENCOUNTER — Ambulatory Visit: Payer: Medicare HMO | Attending: Internal Medicine

## 2019-05-08 DIAGNOSIS — Z20822 Contact with and (suspected) exposure to covid-19: Secondary | ICD-10-CM

## 2019-05-09 ENCOUNTER — Telehealth: Payer: Self-pay | Admitting: *Deleted

## 2019-05-09 ENCOUNTER — Telehealth: Payer: Self-pay

## 2019-05-09 LAB — NOVEL CORONAVIRUS, NAA: SARS-CoV-2, NAA: NOT DETECTED

## 2019-05-09 NOTE — Telephone Encounter (Signed)
Patient given negative result and verbalized understanding  

## 2019-05-09 NOTE — Telephone Encounter (Signed)
Pt called to check status of the covid 19 test result. Pt advised that results are not back yet. He voiced understanding.

## 2019-05-24 ENCOUNTER — Ambulatory Visit: Payer: Self-pay | Admitting: Urology

## 2019-05-24 ENCOUNTER — Encounter: Payer: Self-pay | Admitting: Urology

## 2019-07-11 DIAGNOSIS — R200 Anesthesia of skin: Secondary | ICD-10-CM | POA: Diagnosis not present

## 2019-07-11 DIAGNOSIS — M25512 Pain in left shoulder: Secondary | ICD-10-CM | POA: Diagnosis not present

## 2019-07-19 DIAGNOSIS — R202 Paresthesia of skin: Secondary | ICD-10-CM | POA: Diagnosis not present

## 2019-07-19 DIAGNOSIS — M4802 Spinal stenosis, cervical region: Secondary | ICD-10-CM | POA: Diagnosis not present

## 2019-07-19 DIAGNOSIS — M25512 Pain in left shoulder: Secondary | ICD-10-CM | POA: Diagnosis not present

## 2019-07-19 DIAGNOSIS — R200 Anesthesia of skin: Secondary | ICD-10-CM | POA: Diagnosis not present

## 2019-07-19 DIAGNOSIS — G8929 Other chronic pain: Secondary | ICD-10-CM | POA: Diagnosis not present

## 2019-07-20 ENCOUNTER — Other Ambulatory Visit: Payer: Self-pay | Admitting: Acute Care

## 2019-07-20 DIAGNOSIS — M4802 Spinal stenosis, cervical region: Secondary | ICD-10-CM

## 2019-08-02 ENCOUNTER — Ambulatory Visit: Payer: Medicare HMO

## 2019-08-16 DIAGNOSIS — M25512 Pain in left shoulder: Secondary | ICD-10-CM | POA: Diagnosis not present

## 2019-08-16 DIAGNOSIS — M19012 Primary osteoarthritis, left shoulder: Secondary | ICD-10-CM | POA: Diagnosis not present

## 2019-08-16 DIAGNOSIS — F17210 Nicotine dependence, cigarettes, uncomplicated: Secondary | ICD-10-CM | POA: Diagnosis not present

## 2019-12-21 DIAGNOSIS — R972 Elevated prostate specific antigen [PSA]: Secondary | ICD-10-CM | POA: Diagnosis not present

## 2019-12-21 DIAGNOSIS — Z125 Encounter for screening for malignant neoplasm of prostate: Secondary | ICD-10-CM | POA: Diagnosis not present

## 2019-12-21 DIAGNOSIS — M109 Gout, unspecified: Secondary | ICD-10-CM | POA: Diagnosis not present

## 2019-12-21 DIAGNOSIS — I10 Essential (primary) hypertension: Secondary | ICD-10-CM | POA: Diagnosis not present

## 2019-12-21 DIAGNOSIS — K219 Gastro-esophageal reflux disease without esophagitis: Secondary | ICD-10-CM | POA: Diagnosis not present

## 2019-12-21 DIAGNOSIS — E538 Deficiency of other specified B group vitamins: Secondary | ICD-10-CM | POA: Diagnosis not present

## 2019-12-21 DIAGNOSIS — F3341 Major depressive disorder, recurrent, in partial remission: Secondary | ICD-10-CM | POA: Diagnosis not present
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
## 2020-01-10 ENCOUNTER — Telehealth: Payer: Self-pay | Admitting: Urology

## 2020-01-10 ENCOUNTER — Ambulatory Visit: Payer: Medicare HMO | Admitting: Urology

## 2020-01-10 NOTE — Telephone Encounter (Signed)
This patient was a no-show today for his new patient evaluation for elevated/rising PSA.  Reviewed chart, patient's PSA trend is highly concerning.  I called the patient personally to let him know my concerns.  He states that he was unaware of this appointment today.  He would in fact like to be evaluated in the near future.  We will plan to work him into clinic next Wednesday at 3 PM Oct 6th .  He is agreeable this plan.  Hollice Espy, MD

## 2020-01-16 NOTE — Progress Notes (Signed)
01/17/2020 4:53 PM   [name] 12-09-1960 [id number]  Referring provider: [name], MD [place],  [place] [address] Chief Complaint  Patient presents with  . Elevated PSA    HPI: Edward Weber is a 59 y.o. male who presents today for evaluation and management of elevated PSA. Patient is accompanied by his girl friend.   CT chest, abdomen and pelvis on 03/10/2018 noted hazy soft tissue stranding lateral to the right greater trochanter, suggestive of contusion. No other acute traumatic injury within the chest, abdomen, and pelvis. A 9 mm nonobstructive left renal nephrolithiasis. Moderate aorto bi-iliac atherosclerotic disease. Ascending aortic aneurysm measures up to 4 cm.  Patient was originally referred by Dr. Edwina Barth when PSA was elevated at 7.01. Recent routine PSA was 13.32 on 12/21/2019.  PVR 35 mL. He has some urgency and frequency. He has nocturia x 4-5. Symptoms have been ongoing x 3 months. No hematuria or dysuria.   He has never seen a urologist. He has never taken any medication for urinary symptoms. He has never mentioned any urinary symptoms to his PCP.   Denies family history of prostate cancer.   He has a history of gout. Denies being a diabetic.   PSA trend: 02/18/2015: 2.13 06/10/2017: 3.64 04/16/2019: 7.01 12/21/2019: 13.32    PMH: Past Medical History:  Diagnosis Date  . Alcoholism ([place])   . Allergic rhinitis   . Allergic state   . Anemia   . Arthritis   . Chronic hepatitis C ([place])   . Depression   . Esophagitis, reflux   . GERD (gastroesophageal reflux disease)   . Helicobacter pylori gastritis   . Hepatitis   . Hypertension   . Neuromuscular disorder ([place])   . Osteoarthrosis   . Peripheral neuropathy   . Rhabdomyolysis   . Stroke ([place])   . Stroke ([place])   . Vitiligo     Surgical History: Past Surgical History:  Procedure Laterality Date  . COLON SURGERY    . COLONOSCOPY WITH PROPOFOL N/A  05/09/2015   Procedure: COLONOSCOPY WITH PROPOFOL;  Surgeon: Hulen Luster, MD;  Location: [facility] ENDOSCOPY;  Service: Gastroenterology;  Laterality: N/A;  . ESOPHAGOGASTRODUODENOSCOPY N/A 05/09/2015   Procedure: ESOPHAGOGASTRODUODENOSCOPY (EGD);  Surgeon: Hulen Luster, MD;  Location[facility] ENDOSCOPY;  Service: Gastroenterology;  Laterality: N/A;  . ESOPHAGOGASTRODUODENOSCOPY (EGD) WITH PROPOFOL N/A 05/04/2017   Procedure: ESOPHAGOGASTRODUODENOSCOPY (EGD) WITH PROPOFOL;  Surgeon: Toledo, Benay Pike, MD;  Location: [facility] ENDOSCOPY;  Service: Gastroenterology;  Laterality: N/A;  . [place]   s/p infected dog bite  . TEE WITHOUT CARDIOVERSION N/A 05/20/2012   Procedure: TRANSESOPHAGEAL ECHOCARDIOGRAM (TEE);  Surgeon: Fay Records, MD;  Location: [facility] ENDOSCOPY;  Service: Cardiovascular;  Laterality: N/A;    Home Medications:  Allergies as of 01/17/2020      Reactions   Beef-derived Products    Personal preference   Lactalbumin    Lactase Nausea And Vomiting   Intoloerance   Milk-related Compounds Nausea And Vomiting   Intoloerance   Pegademase Bovine    Other reaction(s): Unknown Personal preference Personal preference Personal preference      Medication List       Accurate as of January 17, 2020 11:59 PM. If you have any questions, ask your nurse or doctor.        albuterol 108 (90 Base) MCG/ACT inhaler Commonly known as: VENTOLIN HFA Inhale into the lungs every 6 (six) hours as needed for wheezing or  shortness of breath.   allopurinol 100 MG tablet Commonly known as: ZYLOPRIM Take 100 mg by mouth 2 (two) times daily.   amLODipine-atorvastatin 5-10 MG tablet Commonly known as: CADUET Take 1 tablet by mouth daily.   benzonatate 100 MG capsule Commonly known as: Tessalon Perles Take 2 capsules (200 mg total) by mouth 3 (three) times daily as needed for cough.   clobetasol cream 0.05 % Commonly known as: TEMOVATE Apply 1 application topically 2 (two) times daily.    colchicine 0.6 MG tablet Take 1 tablet (0.6 mg total) by mouth 2 (two) times daily.   cyanocobalamin 1000 MCG tablet Take 1,000 mcg by mouth daily.   doxepin 25 MG capsule Commonly known as: SINEQUAN Take 1 capsule (25 mg total) by mouth at bedtime.   DULoxetine 60 MG capsule Commonly known as: CYMBALTA Take 1 capsule (60 mg total) by mouth daily.   escitalopram 20 MG tablet Commonly known as: LEXAPRO Take 20 mg by mouth daily.   famotidine 20 MG tablet Commonly known as: PEPCID Take 20 mg by mouth 2 (two) times daily.   ferrous gluconate 324 MG tablet Commonly known as: FERGON Take 324 mg by mouth daily with breakfast.   fluticasone 50 MCG/ACT nasal spray Commonly known as: FLONASE Place 2 sprays into both nostrils daily.   gabapentin 300 MG capsule Commonly known as: NEURONTIN Take 1 capsule (300 mg total) by mouth 2 (two) times daily.   hydrochlorothiazide 25 MG tablet Commonly known as: HYDRODIURIL Take 1 tablet (25 mg total) by mouth daily with breakfast.   hydrocortisone cream 1 % Apply 1 application topically daily as needed for itching.   hydrOXYzine 25 MG tablet Commonly known as: ATARAX/VISTARIL Take 25 mg by mouth 3 (three) times daily as needed. For itching   lansoprazole 15 MG capsule Commonly known as: PREVACID Take 15 mg by mouth daily as needed. For acid reflux   lisinopril-hydrochlorothiazide 20-25 MG tablet Commonly known as: ZESTORETIC Take 1 tablet by mouth daily.   meloxicam 15 MG tablet Commonly known as: MOBIC Take 1 tablet (15 mg total) by mouth daily.   multivitamin tablet Take 1 tablet by mouth daily.   naproxen 500 MG tablet Commonly known as: Naprosyn Take 1 tablet (500 mg total) by mouth 2 (two) times daily with a meal.   oxyCODONE-acetaminophen 5-325 MG tablet Commonly known as: Roxicet Take 1-2 tablets by mouth every 4 (four) hours as needed (Pain).   oxyCODONE-acetaminophen 5-325 MG tablet Commonly known as:  Roxicet Take 1 tablet by mouth every 6 (six) hours as needed for moderate pain.   pantoprazole 40 MG tablet Commonly known as: Protonix Take 1 tablet (40 mg total) by mouth daily.   predniSONE 20 MG tablet Commonly known as: DELTASONE Take 20 mg by mouth daily with breakfast.   ranitidine 150 MG capsule Commonly known as: ZANTAC Take 150 mg by mouth 2 (two) times daily.   senna-docusate 8.6-50 MG tablet Commonly known as: Senokot-S Take 1 tablet by mouth 2 (two) times daily as needed for mild constipation.   sildenafil 20 MG tablet Commonly known as: REVATIO TAKE 3 TABLETS BY MOUTH ONCE DAILY AS NEEDED   sucralfate 1 g tablet Commonly known as: Carafate Take 1 tablet (1 g total) by mouth 4 (four) times daily.   thiamine 100 MG tablet Take 100 mg by mouth daily.   tiZANidine 4 MG capsule Commonly known as: ZANAFLEX Take 4 mg by mouth 3 (three) times daily.  Allergies:  Allergies  Allergen Reactions  . Beef-Derived Products     Personal preference  . Lactalbumin   . Lactase Nausea And Vomiting    Intoloerance  . Milk-Related Compounds Nausea And Vomiting    Intoloerance  . Pegademase Bovine     Other reaction(s): Unknown Personal preference Personal preference Personal preference     Family History: No family history on file.  Social History:  reports that he has been smoking cigarettes. He has never used smokeless tobacco. He reports current alcohol use of about 9.0 standard drinks of alcohol per week. He reports current drug use. Drugs: Marijuana and Cocaine.   Physical Exam: BP 133/76 (BP Location: Left Arm, Patient Position: Sitting, Cuff Size: Normal)   Pulse 76   Ht 5\' 8"  (1.727 m)   Wt 179 lb 12.8 oz (81.6 kg)   BMI 27.34 kg/m   Constitutional:  Alert and oriented, No acute distress. HEENT: [place] AT, moist mucus membranes.  Trachea midline, no masses. Cardiovascular: No clubbing, cyanosis, or edema. Respiratory: Normal respiratory effort,  no increased work of breathing. GI: Abdomen is soft, nontender, nondistended, no abdominal masses Rectal: Normal sphincter exam, 50 + prostate, smooth no nodules/non tender Skin: No rashes, bruises or suspicious lesions. Neurologic: Grossly intact, no focal deficits, moving all 4 extremities. Psychiatric: Normal mood and affect.   Pertinent image: Results for orders placed or performed in visit on 01/17/20  Microscopic Examination   Urine  Result Value Ref Range   WBC, UA 0-5 0 - 5 /hpf   RBC 0-2 0 - 2 /hpf   Epithelial Cells (non renal) 0-10 0 - 10 /hpf   Bacteria, UA None seen None seen/Few  Urinalysis, Complete  Result Value Ref Range   Specific Gravity, UA <1.005 (L) 1.005 - 1.030   pH, UA 5.5 5.0 - 7.5   Color, UA Yellow Yellow   Appearance Ur Clear Clear   Leukocytes,UA Negative Negative   Protein,UA 1+ (A) Negative/Trace   Glucose, UA Negative Negative   Ketones, UA Negative Negative   RBC, UA Trace (A) Negative   Bilirubin, UA Negative Negative   Urobilinogen, Ur 0.2 0.2 - 1.0 mg/dL   Nitrite, UA Negative Negative   Microscopic Examination See below:   Bladder Scan (Post Void Residual) in office  Result Value Ref Range   Scan Result 12mL      Assessment & Plan:    1. Elevated PSA We reviewed the implications of an elevated PSA and the uncertainty surrounding it. In general, a man's PSA increases with age and is produced by both normal and cancerous prostate tissue. Differential for elevated PSA is BPH, prostate cancer, infection, recent intercourse/ejaculation, prostate infarction, recent urethroscopic manipulation (foley placement/cystoscopy) and prostatitis. Management of an elevated PSA can include observation or prostate biopsy and wediscussed this in detail.  We discussed that indications for prostate biopsy are defined by age and race specific PSA cutoffs as well as a PSA velocity of 0.75/year.  Rectal exam today is reassuring however PSA is marketely  elevated, risking, and overall trend is upwards  Recommend biopsy  We discussed prostate biopsy in detail including the procedure itself, the risks of blood in the urine, stool, and ejaculate, serious infection, and discomfort. He is willing to proceed with this as discussed.  Patient is agreeable with plan.   2. Urgency/frequency  UA today- no evidence of infection as contributing factor Most bothersome symptoms are urgency and frequency. PVR 35 mL.  Will address further at  follow up   [facility] [address] [place], [place] [address] [phone]  I, Selena Batten, am acting as a scribe for Dr. Hollice Espy.  I have reviewed the above documentation for accuracy and completeness, and I agree with the above.   Hollice Espy, MD  I spent 45 total minutes on the day of the encounter including pre-visit review of the medical record, face-to-face time with the patient, and post visit ordering of labs/imaging/tests.

## 2020-01-17 ENCOUNTER — Other Ambulatory Visit: Payer: Self-pay

## 2020-01-17 ENCOUNTER — Ambulatory Visit (INDEPENDENT_AMBULATORY_CARE_PROVIDER_SITE_OTHER): Payer: Medicare HMO | Admitting: Urology

## 2020-01-17 VITALS — BP 133/76 | HR 76 | Ht 68.0 in | Wt 179.8 lb

## 2020-01-17 DIAGNOSIS — R350 Frequency of micturition: Secondary | ICD-10-CM

## 2020-01-17 DIAGNOSIS — R972 Elevated prostate specific antigen [PSA]: Secondary | ICD-10-CM | POA: Diagnosis not present

## 2020-01-17 LAB — URINALYSIS, COMPLETE
Bilirubin, UA: NEGATIVE
Glucose, UA: NEGATIVE
Ketones, UA: NEGATIVE
Leukocytes,UA: NEGATIVE
Nitrite, UA: NEGATIVE
Specific Gravity, UA: <1.005 — ABNORMAL LOW (ref 1.005–1.030)
Urobilinogen, Ur: 0.2 mg/dL (ref 0.2–1.0)
pH, UA: 5.5 (ref 5.0–7.5)

## 2020-01-17 LAB — MICROSCOPIC EXAMINATION: Bacteria, UA: NONE SEEN

## 2020-01-17 LAB — BLADDER SCAN AMB NON-IMAGING

## 2020-01-17 NOTE — Patient Instructions (Signed)

## 2020-02-06 NOTE — Progress Notes (Signed)
Prostate Biopsy Procedure   Informed consent was obtained after discussing risks/benefits of the procedure.  A time out was performed to ensure correct patient identity.  Pre-Procedure: - Last PSA Level: 13.32 on 12/21/2019 - Gentamicin given prophylactically - Levaquin 500 mg administered PO -Transrectal Ultrasound performed revealing a 35.3 gm prostate -No significant hypoechoic or median lobe noted  Procedure: - Prostate block performed using 10 cc 1% lidocaine and biopsies taken from sextant areas, a total of 12 under ultrasound guidance.  Post-Procedure: - Patient tolerated the procedure well - He was counseled to seek immediate medical attention if experiences any severe pain, significant bleeding, or fevers - Return in one week to discuss biopsy results    I, Selena Batten, am acting as a scribe for Dr. Hollice Espy.  I have reviewed the above documentation for accuracy and completeness, and I agree with the above.   Hollice Espy, MD

## 2020-02-07 ENCOUNTER — Ambulatory Visit (INDEPENDENT_AMBULATORY_CARE_PROVIDER_SITE_OTHER): Payer: Medicare HMO | Admitting: Urology

## 2020-02-07 ENCOUNTER — Encounter: Payer: Self-pay | Admitting: Urology

## 2020-02-07 ENCOUNTER — Other Ambulatory Visit: Payer: Self-pay

## 2020-02-07 VITALS — BP 151/91 | HR 76

## 2020-02-07 DIAGNOSIS — R972 Elevated prostate specific antigen [PSA]: Secondary | ICD-10-CM

## 2020-02-07 DIAGNOSIS — C61 Malignant neoplasm of prostate: Secondary | ICD-10-CM | POA: Diagnosis not present

## 2020-02-07 MED ORDER — LEVOFLOXACIN 500 MG PO TABS
500.0000 mg | ORAL_TABLET | Freq: Once | ORAL | Status: AC
  Administered 2020-02-07: 500 mg via ORAL

## 2020-02-07 MED ORDER — GENTAMICIN SULFATE 40 MG/ML IJ SOLN
80.0000 mg | Freq: Once | INTRAMUSCULAR | Status: AC
  Administered 2020-02-07: 80 mg via INTRAMUSCULAR

## 2020-02-12 LAB — SURGICAL PATHOLOGY

## 2020-02-12 NOTE — Progress Notes (Signed)
02/13/2020 3:18 PM   [name] 1961/01/13 [id number]  Referring provider: Glendon Axe, MD No address on file Chief Complaint  Patient presents with  . Follow-up    HPI: Edward Weber is a 59 y.o. male who returns for 1 week follow up to discuss prostate biopsy results.   CT chest, abdomen and pelvis on 03/10/2018 noted hazy soft tissue stranding lateral to the right greater trochanter, suggestive of contusion. No other acute traumatic injury within the chest, abdomen, and pelvis. A 9 mm nonobstructive left renal nephrolithiasis. Moderate aorto bi-iliac atherosclerotic disease. Ascending aortic aneurysm measures up to 4 cm.  Patient was originally referred by Dr. Edwina Barth when PSA was elevated at 7.01. Recent routine PSA was 13.32 on 12/21/2019.  He had never seen a urologist. He had never taken any medication for urinary symptoms. He had never mentioned any urinary symptoms to his PCP.   He underwent a prostate biopsy on 02/07/2020. Pathology revealed high volume Gleason score (4+3, 3+4, 3+5) in 11/12 cores. See epic for details.   The patient has a girlfriend who can provide supportive care.   Denies family history of prostate cancer.   He has a history of gout. Denies being a diabetic.   PSA trend: 02/18/2015: 2.13 06/10/2017: 3.64 04/16/2019: 7.01 12/21/2019: 13.32   IPSS    Row Name 02/13/20 1500         International Prostate Symptom Score   How often have you had the sensation of not emptying your bladder? Less than 1 in 5     How often have you had to urinate less than every two hours? Less than half the time     How often have you found you stopped and started again several times when you urinated? Less than 1 in 5 times     How often have you found it difficult to postpone urination? Less than half the time     How often have you had a weak urinary stream? Less than 1 in 5 times     How often have you had to strain to start urination? About  half the time     How many times did you typically get up at night to urinate? 3 Times     Total IPSS Score 13       Quality of Life due to urinary symptoms   If you were to spend the rest of your life with your urinary condition just the way it is now how would you feel about that? Mostly Satisfied            Score:  1-7 Mild 8-19 Moderate 20-35 Severe   SHIM    Row Name 02/13/20 1516         SHIM: Over the last 6 months:   How do you rate your confidence that you could get and keep an erection? Very Low     When you had erections with sexual stimulation, how often were your erections hard enough for penetration (entering your partner)? Sometimes (about half the time)     During sexual intercourse, how often were you able to maintain your erection after you had penetrated (entered) your partner? Almost Never or Never     During sexual intercourse, how difficult was it to maintain your erection to completion of intercourse? Extremely Difficult     When you attempted sexual intercourse, how often was it satisfactory for you? A Few Times (much less than half the time)  SHIM Total Score   SHIM 8            Score: 1-7 Severe ED 8-11 Moderate ED 12-16 Mild-Moderate ED 17-21 Mild ED 22-25 No ED   PMH: Past Medical History:  Diagnosis Date  . Alcoholism ([place])   . Allergic rhinitis   . Allergic state   . Anemia   . Arthritis   . Chronic hepatitis C ([place])   . Depression   . Esophagitis, reflux   . GERD (gastroesophageal reflux disease)   . Helicobacter pylori gastritis   . Hepatitis   . Hypertension   . Neuromuscular disorder ([place])   . Osteoarthrosis   . Peripheral neuropathy   . Rhabdomyolysis   . Stroke ([place])   . Stroke ([place])   . Vitiligo     Surgical History: Past Surgical History:  Procedure Laterality Date  . COLON SURGERY    . COLONOSCOPY WITH PROPOFOL N/A 05/09/2015   Procedure: COLONOSCOPY WITH PROPOFOL;  Surgeon: Hulen Luster, MD;  Location:  [facility] ENDOSCOPY;  Service: Gastroenterology;  Laterality: N/A;  . ESOPHAGOGASTRODUODENOSCOPY N/A 05/09/2015   Procedure: ESOPHAGOGASTRODUODENOSCOPY (EGD);  Surgeon: Hulen Luster, MD;  Location: [facility] ENDOSCOPY;  Service: Gastroenterology;  Laterality: N/A;  . ESOPHAGOGASTRODUODENOSCOPY (EGD) WITH PROPOFOL N/A 05/04/2017   Procedure: ESOPHAGOGASTRODUODENOSCOPY (EGD) WITH PROPOFOL;  Surgeon: Toledo, Benay Pike, MD;  Location: [facility] ENDOSCOPY;  Service: Gastroenterology;  Laterality: N/A;  . [place]   s/p infected dog bite  . TEE WITHOUT CARDIOVERSION N/A 05/20/2012   Procedure: TRANSESOPHAGEAL ECHOCARDIOGRAM (TEE);  Surgeon: Fay Records, MD;  Location: [facility] ENDOSCOPY;  Service: Cardiovascular;  Laterality: N/A;    Home Medications:  Allergies as of 02/13/2020      Reactions   Beef-derived Products    Personal preference   Lactalbumin    Lactase Nausea And Vomiting   Intoloerance   Milk-related Compounds Nausea And Vomiting   Intoloerance   Pegademase Bovine    Other reaction(s): Unknown Personal preference Personal preference Personal preference      Medication List       Accurate as of February 13, 2020  3:18 PM. If you have any questions, ask your nurse or doctor.        albuterol 108 (90 Base) MCG/ACT inhaler Commonly known as: VENTOLIN HFA Inhale into the lungs every 6 (six) hours as needed for wheezing or shortness of breath.   allopurinol 100 MG tablet Commonly known as: ZYLOPRIM Take 100 mg by mouth 2 (two) times daily.   amLODipine-atorvastatin 5-10 MG tablet Commonly known as: CADUET Take 1 tablet by mouth daily.   benzonatate 100 MG capsule Commonly known as: Tessalon Perles Take 2 capsules (200 mg total) by mouth 3 (three) times daily as needed for cough.   clobetasol cream 0.05 % Commonly known as: TEMOVATE Apply 1 application topically 2 (two) times daily.   colchicine 0.6 MG tablet Take 1 tablet (0.6 mg total) by mouth 2 (two) times daily.    cyanocobalamin 1000 MCG tablet Take 1,000 mcg by mouth daily.   doxepin 25 MG capsule Commonly known as: SINEQUAN Take 1 capsule (25 mg total) by mouth at bedtime.   DULoxetine 60 MG capsule Commonly known as: CYMBALTA Take 1 capsule (60 mg total) by mouth daily.   escitalopram 20 MG tablet Commonly known as: LEXAPRO Take 20 mg by mouth daily.   famotidine 20 MG tablet Commonly known as: PEPCID Take 20 mg by mouth 2 (two) times daily.   ferrous  gluconate 324 MG tablet Commonly known as: FERGON Take 324 mg by mouth daily with breakfast.   fluticasone 50 MCG/ACT nasal spray Commonly known as: FLONASE Place 2 sprays into both nostrils daily.   gabapentin 300 MG capsule Commonly known as: NEURONTIN Take 1 capsule (300 mg total) by mouth 2 (two) times daily.   gabapentin 100 MG capsule Commonly known as: NEURONTIN   hydrochlorothiazide 25 MG tablet Commonly known as: HYDRODIURIL Take 1 tablet (25 mg total) by mouth daily with breakfast.   hydrocortisone cream 1 % Apply 1 application topically daily as needed for itching.   hydrOXYzine 25 MG tablet Commonly known as: ATARAX/VISTARIL Take 25 mg by mouth 3 (three) times daily as needed. For itching   lansoprazole 15 MG capsule Commonly known as: PREVACID Take 15 mg by mouth daily as needed. For acid reflux   lisinopril-hydrochlorothiazide 20-25 MG tablet Commonly known as: ZESTORETIC Take 1 tablet by mouth daily.   meloxicam 15 MG tablet Commonly known as: MOBIC Take 1 tablet (15 mg total) by mouth daily.   multivitamin tablet Take 1 tablet by mouth daily.   naproxen 500 MG tablet Commonly known as: Naprosyn Take 1 tablet (500 mg total) by mouth 2 (two) times daily with a meal.   oxyCODONE-acetaminophen 5-325 MG tablet Commonly known as: Roxicet Take 1-2 tablets by mouth every 4 (four) hours as needed (Pain).   oxyCODONE-acetaminophen 5-325 MG tablet Commonly known as: Roxicet Take 1 tablet by mouth  every 6 (six) hours as needed for moderate pain.   pantoprazole 40 MG tablet Commonly known as: Protonix Take 1 tablet (40 mg total) by mouth daily.   predniSONE 20 MG tablet Commonly known as: DELTASONE Take 20 mg by mouth daily with breakfast.   ranitidine 150 MG capsule Commonly known as: ZANTAC Take 150 mg by mouth 2 (two) times daily.   senna-docusate 8.6-50 MG tablet Commonly known as: Senokot-S Take 1 tablet by mouth 2 (two) times daily as needed for mild constipation.   sildenafil 20 MG tablet Commonly known as: REVATIO TAKE 3 TABLETS BY MOUTH ONCE DAILY AS NEEDED   sucralfate 1 g tablet Commonly known as: Carafate Take 1 tablet (1 g total) by mouth 4 (four) times daily.   thiamine 100 MG tablet Take 100 mg by mouth daily.   tiZANidine 4 MG capsule Commonly known as: ZANAFLEX Take 4 mg by mouth 3 (three) times daily.       Allergies:  Allergies  Allergen Reactions  . Beef-Derived Products     Personal preference  . Lactalbumin   . Lactase Nausea And Vomiting    Intoloerance  . Milk-Related Compounds Nausea And Vomiting    Intoloerance  . Pegademase Bovine     Other reaction(s): Unknown Personal preference Personal preference Personal preference     Family History: No family history on file.  Social History:  reports that he has been smoking cigarettes. He has never used smokeless tobacco. He reports current alcohol use of about 9.0 standard drinks of alcohol per week. He reports current drug use. Drugs: Marijuana and Cocaine.   Physical Exam: BP (!) 153/91   Pulse 81   Wt 178 lb (80.7 kg)   BMI 27.06 kg/m   Constitutional:  Alert and oriented, No acute distress. HEENT: [place] AT, moist mucus membranes.  Trachea midline, no masses. Cardiovascular: No clubbing, cyanosis, or edema. Respiratory: Normal respiratory effort, no increased work of breathing. Skin: No rashes, bruises or suspicious lesions. Neurologic: Grossly intact, no  focal deficits,  moving all 4 extremities. Psychiatric: Normal mood and affect.   Assessment & Plan:    1. High risk prostate cancer  59 year old male with newly diagnosed high risk, high-volume prostate cancer.  The patient was counseled about the natural history of prostate cancer and the standard treatment options that are available for prostate cancer. It was explained to him how his age and life expectancy, clinical stage, Gleason score, and PSA affect his prognosis, the decision to proceed with additional staging studies, as well as how that information influences recommended treatment strategies. We discussed the roles for active surveillance, radiation therapy, surgical therapy, androgen deprivation, as well as ablative therapy options for the treatment of prostate cancer as appropriate to his individual cancer situation.   Baseline IPSS and Shim as above.  I will order a CT A/P and bone scan for staging given his high risk disease and to rule out metastasis.  We will have him return next week to discuss his treatment options based on staging.  He was given the book "100 questions".  All questions answered today, he is fairly anxious and will try to expedite his follow-up.  He understands importance of follow-up.  He occasionally has difficulty answering his phone and in fact seemed to be unaware of his appointment today.  He was seen later in the afternoon after we called him multiple times.  We stressed the importance of timely follow-up and all appointments/imaging was scheduled while he was in the office and written down for him.  We also urged him to bring a family member or friend with him to his follow-up appointment.    He refused to sign a DPR but did indicate that we could reach out to his girlfriend to try to get in touch with him but not disclose medical information to her.  2. Urgency/frequency  IPSS score: 13, moderate.   3. Erectile dysfunction SHIM score is 8 which is moderate  ED.   [facility] [address] [place], [place] [address] [phone]  I, Selena Batten, am acting as a scribe for Dr. Hollice Espy.  I have reviewed the above documentation for accuracy and completeness, and I agree with the above.   Hollice Espy, MD

## 2020-02-13 ENCOUNTER — Other Ambulatory Visit: Payer: Self-pay

## 2020-02-13 ENCOUNTER — Ambulatory Visit (INDEPENDENT_AMBULATORY_CARE_PROVIDER_SITE_OTHER): Payer: Medicare HMO | Admitting: Urology

## 2020-02-13 VITALS — BP 153/91 | HR 81 | Wt 178.0 lb

## 2020-02-13 DIAGNOSIS — C61 Malignant neoplasm of prostate: Secondary | ICD-10-CM

## 2020-02-14 ENCOUNTER — Ambulatory Visit: Payer: Self-pay | Admitting: Urology

## 2020-02-14 LAB — CREATININE, SERUM
Creatinine, Ser: 1.09 mg/dL (ref 0.76–1.27)
GFR calc Af Amer: 84 mL/min/{1.73_m2} (ref >59)
GFR calc non Af Amer: 73 mL/min/{1.73_m2} (ref >59)

## 2020-02-16 ENCOUNTER — Encounter: Payer: Self-pay | Admitting: *Deleted

## 2020-02-16 ENCOUNTER — Ambulatory Visit
Admission: RE | Admit: 2020-02-16 | Discharge: 2020-02-16 | Disposition: A | Discharge status: Home / Self Care | Payer: Medicare HMO | Source: Ambulatory Visit | Attending: Urology | Admitting: Urology

## 2020-02-16 ENCOUNTER — Encounter
Admission: RE | Admit: 2020-02-16 | Discharge: 2020-02-16 | Disposition: A | Discharge status: Home / Self Care | Payer: Medicare HMO | Source: Ambulatory Visit | Attending: Urology | Admitting: Urology

## 2020-02-16 ENCOUNTER — Other Ambulatory Visit: Payer: Self-pay

## 2020-02-16 DIAGNOSIS — M170 Bilateral primary osteoarthritis of knee: Secondary | ICD-10-CM | POA: Diagnosis not present

## 2020-02-16 DIAGNOSIS — M19012 Primary osteoarthritis, left shoulder: Secondary | ICD-10-CM | POA: Diagnosis not present

## 2020-02-16 DIAGNOSIS — M160 Bilateral primary osteoarthritis of hip: Secondary | ICD-10-CM | POA: Diagnosis not present

## 2020-02-16 DIAGNOSIS — C61 Malignant neoplasm of prostate: Secondary | ICD-10-CM | POA: Insufficient documentation

## 2020-02-16 DIAGNOSIS — K449 Diaphragmatic hernia without obstruction or gangrene: Secondary | ICD-10-CM | POA: Diagnosis not present

## 2020-02-16 DIAGNOSIS — M19011 Primary osteoarthritis, right shoulder: Secondary | ICD-10-CM | POA: Diagnosis not present

## 2020-02-16 DIAGNOSIS — N200 Calculus of kidney: Secondary | ICD-10-CM | POA: Diagnosis not present

## 2020-02-16 HISTORY — DX: Sickle-cell disease without crisis: D57.1

## 2020-02-16 HISTORY — DX: Malignant (primary) neoplasm, unspecified: C80.1

## 2020-02-16 MED ORDER — TECHNETIUM TC 99M MEDRONATE IV KIT
20.0000 | PACK | Freq: Once | INTRAVENOUS | Status: AC | PRN
  Administered 2020-02-16: 21.41 via INTRAVENOUS

## 2020-02-16 MED ORDER — IOHEXOL 300 MG/ML  SOLN
100.0000 mL | Freq: Once | INTRAMUSCULAR | Status: AC | PRN
  Administered 2020-02-16: 100 mL via INTRAVENOUS

## 2020-02-20 ENCOUNTER — Ambulatory Visit (INDEPENDENT_AMBULATORY_CARE_PROVIDER_SITE_OTHER): Payer: Medicare HMO | Admitting: Urology

## 2020-02-20 ENCOUNTER — Other Ambulatory Visit: Payer: Self-pay

## 2020-02-20 VITALS — BP 173/105 | HR 77 | Wt 178.0 lb

## 2020-02-20 DIAGNOSIS — C61 Malignant neoplasm of prostate: Secondary | ICD-10-CM | POA: Diagnosis not present

## 2020-02-20 NOTE — Progress Notes (Signed)
02/20/2020 9:07 AM   [name] January 01, 1961 [id number]  Referring provider: Glendon Axe, MD No address on file  Cc: Prostate cancer  HPI: Edward Weber is a 59 y.o. male who with newly diagnosed prostate cancer who returns today for follow-up staging imaging to discuss management options.   Patient was originally referred by Dr. Edwina Barth when PSA was elevated at 7.01. Recent routine PSA was 13.32 on 12/21/2019.  He had never seen a urologist. He had never taken any medication for urinary symptoms. He had never mentioned any urinary symptoms to his PCP.   He underwent a prostate biopsy on 02/07/2020. Pathology revealed high volume Gleason score (4+3, 3+4, 3+5) in 11/12 cores. See epic for details.   In the interim, he is undergone CT scan and bone scan on 02/16/2019.  CT scan showed a heterogeneous prostate without evidence this.  Incidental additional findings degenerative disease as well as nonobstructing kidney stones.  IPSS and Shim for baseline on previous note.  No previous abdominal surgery.  He does have a personal history of substance abuse.  PSA trend: 02/18/2015: 2.13 06/10/2017: 3.64 04/16/2019: 7.01 12/21/2019: 13.32   PMH: Past Medical History:  Diagnosis Date  . Alcoholism ([place])   . Allergic rhinitis   . Allergic state   . Anemia   . Arthritis   . Cancer ([place])   . Chronic hepatitis C ([place])   . Depression   . Esophagitis, reflux   . GERD (gastroesophageal reflux disease)   . Helicobacter pylori gastritis   . Hepatitis   . Hypertension   . Neuromuscular disorder ([place])   . Osteoarthrosis   . Peripheral neuropathy   . Rhabdomyolysis   . Sickle cell anemia (H[facility])   . Stroke ([place])   . Stroke ([place])   . Vitiligo     Surgical History: Past Surgical History:  Procedure Laterality Date  . COLON SURGERY    . COLONOSCOPY WITH PROPOFOL N/A 05/09/2015   Procedure: COLONOSCOPY WITH PROPOFOL;  Surgeon: Hulen Luster, MD;  Location: [facility]  ENDOSCOPY;  Service: Gastroenterology;  Laterality: N/A;  . ESOPHAGOGASTRODUODENOSCOPY N/A 05/09/2015   Procedure: ESOPHAGOGASTRODUODENOSCOPY (EGD);  Surgeon: Hulen Luster, MD;  Location[facility] ENDOSCOPY;  Service: Gastroenterology;  Laterality: N/A;  . ESOPHAGOGASTRODUODENOSCOPY (EGD) WITH PROPOFOL N/A 05/04/2017   Procedure: ESOPHAGOGASTRODUODENOSCOPY (EGD) WITH PROPOFOL;  Surgeon: Toledo, Benay Pike, MD;  Location: [facility] ENDOSCOPY;  Service: Gastroenterology;  Laterality: N/A;  . [place]   s/p infected dog bite  . TEE WITHOUT CARDIOVERSION N/A 05/20/2012   Procedure: TRANSESOPHAGEAL ECHOCARDIOGRAM (TEE);  Surgeon: Fay Records, MD;  Location: [facility] ENDOSCOPY;  Service: Cardiovascular;  Laterality: N/A;    Home Medications:  Allergies as of 02/20/2020      Reactions   Beef-derived Products    Personal preference   Lactalbumin    Lactase Nausea And Vomiting   Intoloerance   Milk-related Compounds Nausea And Vomiting   Intoloerance   Pegademase Bovine    Other reaction(s): Unknown Personal preference Personal preference Personal preference      Medication List       Accurate as of February 20, 2020 11:59 PM. If you have any questions, ask your nurse or doctor.        albuterol 108 (90 Base) MCG/ACT inhaler Commonly known as: VENTOLIN HFA Inhale into the lungs every 6 (six) hours as needed for wheezing or shortness of breath.   allopurinol 100 MG tablet Commonly known as: ZYLOPRIM Take 100 mg by  mouth 2 (two) times daily.   amLODipine-atorvastatin 5-10 MG tablet Commonly known as: CADUET Take 1 tablet by mouth daily.   benzonatate 100 MG capsule Commonly known as: Tessalon Perles Take 2 capsules (200 mg total) by mouth 3 (three) times daily as needed for cough.   clobetasol cream 0.05 % Commonly known as: TEMOVATE Apply 1 application topically 2 (two) times daily.   colchicine 0.6 MG tablet Take 1 tablet (0.6 mg total) by mouth 2 (two) times daily.    cyanocobalamin 1000 MCG tablet Take 1,000 mcg by mouth daily.   doxepin 25 MG capsule Commonly known as: SINEQUAN Take 1 capsule (25 mg total) by mouth at bedtime.   DULoxetine 60 MG capsule Commonly known as: CYMBALTA Take 1 capsule (60 mg total) by mouth daily.   escitalopram 20 MG tablet Commonly known as: LEXAPRO Take 20 mg by mouth daily.   famotidine 20 MG tablet Commonly known as: PEPCID Take 20 mg by mouth 2 (two) times daily.   ferrous gluconate 324 MG tablet Commonly known as: FERGON Take 324 mg by mouth daily with breakfast.   fluticasone 50 MCG/ACT nasal spray Commonly known as: FLONASE Place 2 sprays into both nostrils daily.   gabapentin 300 MG capsule Commonly known as: NEURONTIN Take 1 capsule (300 mg total) by mouth 2 (two) times daily.   gabapentin 100 MG capsule Commonly known as: NEURONTIN   hydrochlorothiazide 25 MG tablet Commonly known as: HYDRODIURIL Take 1 tablet (25 mg total) by mouth daily with breakfast.   hydrocortisone cream 1 % Apply 1 application topically daily as needed for itching.   hydrOXYzine 25 MG tablet Commonly known as: ATARAX/VISTARIL Take 25 mg by mouth 3 (three) times daily as needed. For itching   lansoprazole 15 MG capsule Commonly known as: PREVACID Take 15 mg by mouth daily as needed. For acid reflux   lisinopril-hydrochlorothiazide 20-25 MG tablet Commonly known as: ZESTORETIC Take 1 tablet by mouth daily.   meloxicam 15 MG tablet Commonly known as: MOBIC Take 1 tablet (15 mg total) by mouth daily.   multivitamin tablet Take 1 tablet by mouth daily.   naproxen 500 MG tablet Commonly known as: Naprosyn Take 1 tablet (500 mg total) by mouth 2 (two) times daily with a meal.   oxyCODONE-acetaminophen 5-325 MG tablet Commonly known as: Roxicet Take 1-2 tablets by mouth every 4 (four) hours as needed (Pain).   oxyCODONE-acetaminophen 5-325 MG tablet Commonly known as: Roxicet Take 1 tablet by mouth  every 6 (six) hours as needed for moderate pain.   pantoprazole 40 MG tablet Commonly known as: Protonix Take 1 tablet (40 mg total) by mouth daily.   predniSONE 20 MG tablet Commonly known as: DELTASONE Take 20 mg by mouth daily with breakfast.   ranitidine 150 MG capsule Commonly known as: ZANTAC Take 150 mg by mouth 2 (two) times daily.   senna-docusate 8.6-50 MG tablet Commonly known as: Senokot-S Take 1 tablet by mouth 2 (two) times daily as needed for mild constipation.   sildenafil 20 MG tablet Commonly known as: REVATIO TAKE 3 TABLETS BY MOUTH ONCE DAILY AS NEEDED   sucralfate 1 g tablet Commonly known as: Carafate Take 1 tablet (1 g total) by mouth 4 (four) times daily.   thiamine 100 MG tablet Take 100 mg by mouth daily.   tiZANidine 4 MG capsule Commonly known as: ZANAFLEX Take 4 mg by mouth 3 (three) times daily.       Allergies:  Allergies  Allergen  Reactions  . Beef-Derived Products     Personal preference  . Lactalbumin   . Lactase Nausea And Vomiting    Intoloerance  . Milk-Related Compounds Nausea And Vomiting    Intoloerance  . Pegademase Bovine     Other reaction(s): Unknown Personal preference Personal preference Personal preference     Family History: No family history on file.  Social History:  reports that he has been smoking cigarettes. He has never used smokeless tobacco. He reports current alcohol use of about 9.0 standard drinks of alcohol per week. He reports current drug use. Drugs: Marijuana and Cocaine.   Physical Exam: BP (!) 173/105   Pulse 77   Wt 178 lb (80.7 kg)   BMI 27.06 kg/m   Constitutional:  Alert and oriented, No acute distress. HEENT: [place] AT, moist mucus membranes.  Trachea midline, no masses. Cardiovascular: No clubbing, cyanosis, or edema. Respiratory: Normal respiratory effort, no increased work of breathing. Skin: No rashes, bruises or suspicious lesions. Neurologic: Grossly intact, no focal  deficits, moving all 4 extremities. Psychiatric: Normal mood and affect.   Pertinent Imaging: Narrative & Impression  CLINICAL DATA:  Staging of newly diagnosed prostate cancer  EXAM: CT ABDOMEN AND PELVIS WITH CONTRAST  TECHNIQUE: Multidetector CT imaging of the abdomen and pelvis was performed using the standard protocol following bolus administration of intravenous contrast.  CONTRAST:  126mL OMNIPAQUE IOHEXOL 300 MG/ML  SOLN  COMPARISON:  02/15/2015 and report from 03/10/2018.  FINDINGS: Lower chest: Small type 1 hiatal hernia. Borderline cardiomegaly. Right coronary artery atherosclerotic calcification.  Hepatobiliary: Nodular hepatic contour compatible with cirrhosis. No discrete hepatic mass is identified. Gallbladder unremarkable. No biliary dilatation.  Pancreas: Unremarkable  Spleen: Unremarkable  Adrenals/Urinary Tract: Nonspecific 5 mm hypodense right mid kidney lesion on image 72 of series 5.  0.7 cm nonobstructive left kidney lower pole calculus.  Urinary bladder unremarkable.  Stomach/Bowel: Small type 1 hiatal hernia.  Vascular/Lymphatic: Aortoiliac atherosclerotic vascular disease. No pathologic adenopathy.  Reproductive: Heterogeneous enhancement in the prostate gland.  Other: No supplemental non-categorized findings.  Musculoskeletal: Transitional S1 vertebra. Bilateral chronic pars defects at L5 with 1.0 cm anterolisthesis of L5 on S1. In conjunction with spondylosis and degenerative disc disease there is notable bilateral foraminal impingement at L4-5 and L5-S1.  Degenerative bilateral hip arthropathy. Chondrocalcinosis in the acetabular labrum bilaterally.  IMPRESSION: 1. Heterogeneous enhancement in the prostate gland. No adenopathy or findings of osseous metastatic disease. 2. Other imaging findings of potential clinical significance: Coronary atherosclerosis with borderline cardiomegaly. Small type 1 hiatal  hernia. Bilateral chronic pars defects at L5 with 1.0 cm anterolisthesis of L5 on S1. Bilateral foraminal impingement at L4-5 and L5-S1. Degenerative bilateral hip arthropathy. Chondrocalcinosis in the acetabular labrum bilaterally, query CPPD arthropathy. 0.7 cm nonobstructive left kidney lower pole calculus. 3. Aortic atherosclerosis.  Aortic Atherosclerosis (ICD10-I70.0).   Electronically Signed   By: Van Clines M.D.   On: 02/16/2020 15:29    CLINICAL DATA:  Prostate cancer staging  EXAM: NUCLEAR MEDICINE WHOLE BODY BONE SCAN  TECHNIQUE: Whole body anterior and posterior images were obtained approximately 3 hours after intravenous injection of radiopharmaceutical.  RADIOPHARMACEUTICALS:  21.4 mCi Technetium-18m MDP IV  COMPARISON:  Multiple exams, including CT abdomen 02/16/2020  FINDINGS: Degenerative findings are observed in the right [facility] joint, bilateral knees, bilateral ankles, along the sternoclavicular joints, and in the cervical spine and upper thoracic spine. Degenerative findings are also present in the elbows and wrists. Degenerative activity at the L5-S1 level.  No findings  characteristic for osseous metastatic disease. Expected renal excretion of contrast, initial images had urinary contamination and repeat images of the pelvis and lower extremities were performed to clear this.  IMPRESSION: 1. No current findings of osseous metastatic disease. Degenerative joint findings as noted above.   Electronically Signed   By: Van Clines M.D.   On: 02/16/2020 15:31   CT scan of pelvis, personally reviewed today.  Agree that there is no evidence of metastatic disease.  Assessment & Plan:    1. Prostate cancer [facility]) Diagnosed high risk high-volume prostate cancer without evidence of metastatic disease.  Based on, he does have a fairly high risk of extracapsular extension another locally advanced pathology.  We discussed the roles  for active surveillance, radiation therapy, surgical therapy, androgen deprivation, as well as ablative therapy options for the treatment of prostate cancer as appropriate to his individual cancer situation. We discussed the risks and benefits of these options with regard to their impact on cancer control and also in terms of potential adverse events, complications, and impact on quality of life particularly related to urinary, bowel, and sexual function. The patient was encouraged to ask questions throughout the discussion today and all questions were answered to his stated satisfaction. In addition, the patient was provided with and/or directed to appropriate resources and literature for further education about prostate cancer treatment options.  We discussed surgical therapy for prostate cancer including the different available surgical approaches.  Specifically, we discussed robotic prostatectomy with pelvic lymph node dissection based on his restratification.  We discussed, in detail, the risks and expectations of surgery with regard to cancer control, urinary control, and erectile dysfunction as well as expected post operative recovery processed. Additional risks of surgery including but not limited to bleeding, infection, hernia formation, nerve damage, fistula formation, bowel/rectal injury, potentially necessitating colostomy, damage to the urinary tract resulting in urinary leakage, urethral stricture, and cardiopulmonary risk such as myocardial infarction, stroke, death, thromboembolism etc. were explained.   Although he is relatively young, he does have multiple medical comorbidities as well as history of substance, etc.  He is not sure if surgical intervention is the right option for him.  He like to discuss his radiation options with Dr. Baruch Gouty.  He understands that if he chose to have external beam radiation, he would also need adjuvant androgen deprivation therapy for 2 to 3 years based on his  disease we discussed side effects of that as well.  Plan for radiation oncology referral and then follow back up with me in a few weeks for final decision.  He is agreeable this plan.   - Ambulatory referral to [place], MD  [facility] [address], [place], [place] [address] [phone]  I spent 30 total minutes on the day of the encounter including pre-visit review of the medical record, face-to-face time with the patient, and post visit ordering of labs/imaging/tests.

## 2020-02-26 ENCOUNTER — Telehealth: Payer: Self-pay

## 2020-02-26 ENCOUNTER — Other Ambulatory Visit: Payer: Self-pay

## 2020-02-26 ENCOUNTER — Encounter: Payer: Self-pay | Admitting: Radiation Oncology

## 2020-02-26 ENCOUNTER — Ambulatory Visit
Admission: RE | Admit: 2020-02-26 | Discharge: 2020-02-26 | Disposition: A | Discharge status: Home / Self Care | Payer: Medicare HMO | Source: Ambulatory Visit | Attending: Radiation Oncology | Admitting: Radiation Oncology

## 2020-02-26 VITALS — BP 133/82 | HR 72 | Temp 97.0°F | Wt 179.3 lb

## 2020-02-26 DIAGNOSIS — C61 Malignant neoplasm of prostate: Secondary | ICD-10-CM | POA: Diagnosis not present

## 2020-02-26 NOTE — Consult Note (Signed)
NEW PATIENT EVALUATION  Name: Edward Weber  MRN: [id number]  Date:   02/26/2020     DOB: [birth year]   This 59 y.o. male patient presents to the clinic for initial evaluation of high risk high-volume 1 core of Gleason 8 (3+5) presenting with a PSA of over 12.  REFERRING PHYSICIAN: [name], MD  CHIEF COMPLAINT:  Chief Complaint  Patient presents with  . Consult    DIAGNOSIS: The encounter diagnosis was Prostate cancer ([place]).   PREVIOUS INVESTIGATIONS:  CT scan and bone scan reviewed Pathology report reviewed Clinical notes reviewed  HPI: Patient is a 59 year old male with multiple comorbidities including a sending aortic aneurysm measuring up to 4 cm history of alcoholism and chronic hepatitis C also status post CVA.  He presented with increasing PSA over the past year most recently in September was 13.3.  This prompted transrectal ultrasound-guided biopsy showing widespread involvement of adenocarcinoma in the range of Gleason scores from Gleason 7 (4+3) Gleason 73+4) and a single core positive for Gleason 8 (3+5).  In total of 9 of 12 cores were positive.  Bone scan was negative for metastatic disease.  CT scan showed heterogeneous enhancement of the prostate gland with no evidence of adenopathy or findings suspicious for metastatic osseous disease.  Again does have a aortic aneurysm.  Patient does have nocturia x3-4 some urgency and frequency of urination.  Has been seen by urology and patient is leaning from surgery and would like to explore radiation options.  PLANNED TREATMENT REGIMEN: Image guided IMRT radiation therapy to both prostate and pelvic nodes  PAST MEDICAL HISTORY:  has a past medical history of Alcoholism ([place]), Allergic rhinitis, Allergic state, Anemia, Arthritis, Cancer ([place]), Chronic hepatitis C ([place]), Depression, Esophagitis, reflux, GERD (gastroesophageal reflux disease), Helicobacter pylori gastritis, Hepatitis, Hypertension, Neuromuscular disorder  ([place]), Osteoarthrosis, Peripheral neuropathy, Rhabdomyolysis, Sickle cell anemia ([place]), Stroke ([place]), Stroke ([place]), and Vitiligo.    PAST SURGICAL HISTORY:  Past Surgical History:  Procedure Laterality Date  . COLON SURGERY    . COLONOSCOPY WITH PROPOFOL N/A 05/09/2015   Procedure: COLONOSCOPY WITH PROPOFOL;  Surgeon: Hulen Luster, MD;  Location: [facility] ENDOSCOPY;  Service: Gastroenterology;  Laterality: N/A;  . ESOPHAGOGASTRODUODENOSCOPY N/A 05/09/2015   Procedure: ESOPHAGOGASTRODUODENOSCOPY (EGD);  Surgeon: Hulen Luster, MD;  Location[facility] ENDOSCOPY;  Service: Gastroenterology;  Laterality: N/A;  . ESOPHAGOGASTRODUODENOSCOPY (EGD) WITH PROPOFOL N/A 05/04/2017   Procedure: ESOPHAGOGASTRODUODENOSCOPY (EGD) WITH PROPOFOL;  Surgeon: Toledo, Benay Pike, MD;  Location: [facility] ENDOSCOPY;  Service: Gastroenterology;  Laterality: N/A;  . [place]   s/p infected dog bite  . TEE WITHOUT CARDIOVERSION N/A 05/20/2012   Procedure: TRANSESOPHAGEAL ECHOCARDIOGRAM (TEE);  Surgeon: Fay Records, MD;  Location: [place];  Service: Cardiovascular;  Laterality: N/A;    FAMILY HISTORY: family history is not on file.  SOCIAL HISTORY:  reports that he has been smoking cigarettes. He has never used smokeless tobacco. He reports current alcohol use of about 9.0 standard drinks of alcohol per week. He reports current drug use. Drugs: Marijuana and Cocaine.  ALLERGIES: Beef-derived products, Lactalbumin, Lactase, Milk-related compounds, and Pegademase bovine  MEDICATIONS:  Current Outpatient Medications  Medication Sig Dispense Refill  . albuterol (PROVENTIL HFA;VENTOLIN HFA) 108 (90 Base) MCG/ACT inhaler Inhale into the lungs every 6 (six) hours as needed for wheezing or shortness of breath.    . allopurinol (ZYLOPRIM) 100 MG tablet Take 100 mg by mouth 2 (two) times daily.    Marland Kitchen amLODipine-atorvastatin (CADUET) 5-10  MG tablet Take 1 tablet by mouth daily.    . benzonatate (TESSALON PERLES) 100 MG capsule  Take 2 capsules (200 mg total) by mouth 3 (three) times daily as needed for cough. 30 capsule 0  . clobetasol cream (TEMOVATE) 1.85 % Apply 1 application topically 2 (two) times daily.    . colchicine 0.6 MG tablet Take 1 tablet (0.6 mg total) by mouth 2 (two) times daily. 60 tablet 1  . cyanocobalamin 1000 MCG tablet Take 1,000 mcg by mouth daily.    Marland Kitchen doxepin (SINEQUAN) 25 MG capsule Take 1 capsule (25 mg total) by mouth at bedtime. 30 capsule 1  . DULoxetine (CYMBALTA) 60 MG capsule Take 1 capsule (60 mg total) by mouth daily. 30 capsule 1  . escitalopram (LEXAPRO) 20 MG tablet Take 20 mg by mouth daily.    . famotidine (PEPCID) 20 MG tablet Take 20 mg by mouth 2 (two) times daily.    . ferrous gluconate (FERGON) 324 MG tablet Take 324 mg by mouth daily with breakfast.    . fluticasone (FLONASE) 50 MCG/ACT nasal spray Place 2 sprays into both nostrils daily.    Marland Kitchen gabapentin (NEURONTIN) 100 MG capsule     . gabapentin (NEURONTIN) 300 MG capsule Take 1 capsule (300 mg total) by mouth 2 (two) times daily. 60 capsule 1  . hydrochlorothiazide (HYDRODIURIL) 25 MG tablet Take 1 tablet (25 mg total) by mouth daily with breakfast. 30 tablet 1  . hydrocortisone cream 1 % Apply 1 application topically daily as needed for itching.    . hydrOXYzine (ATARAX/VISTARIL) 25 MG tablet Take 25 mg by mouth 3 (three) times daily as needed. For itching    . lansoprazole (PREVACID) 15 MG capsule Take 15 mg by mouth daily as needed. For acid reflux    . lisinopril-hydrochlorothiazide (PRINZIDE,ZESTORETIC) 20-25 MG tablet Take 1 tablet by mouth daily.    . meloxicam (MOBIC) 15 MG tablet Take 1 tablet (15 mg total) by mouth daily. 30 tablet 0  . Multiple Vitamin (MULTIVITAMIN) tablet Take 1 tablet by mouth daily.    . naproxen (NAPROSYN) 500 MG tablet Take 1 tablet (500 mg total) by mouth 2 (two) times daily with a meal. 20 tablet 2  . oxyCODONE-acetaminophen (ROXICET) 5-325 MG per tablet Take 1-2 tablets by mouth every  4 (four) hours as needed (Pain). 60 tablet 0  . oxyCODONE-acetaminophen (ROXICET) 5-325 MG tablet Take 1 tablet by mouth every 6 (six) hours as needed for moderate pain. 12 tablet 0  . predniSONE (DELTASONE) 20 MG tablet Take 20 mg by mouth daily with breakfast.    . ranitidine (ZANTAC) 150 MG capsule Take 150 mg by mouth 2 (two) times daily.    Marland Kitchen senna-docusate (SENOKOT-S) 8.6-50 MG per tablet Take 1 tablet by mouth 2 (two) times daily as needed for mild constipation.    . sildenafil (REVATIO) 20 MG tablet TAKE 3 TABLETS BY MOUTH ONCE DAILY AS NEEDED    . sucralfate (CARAFATE) 1 G tablet Take 1 tablet (1 g total) by mouth 4 (four) times daily. 60 tablet 0  . thiamine 100 MG tablet Take 100 mg by mouth daily.    Marland Kitchen tiZANidine (ZANAFLEX) 4 MG capsule Take 4 mg by mouth 3 (three) times daily.    . pantoprazole (PROTONIX) 40 MG tablet Take 1 tablet (40 mg total) by mouth daily. 30 tablet 1   No current facility-administered medications for this encounter.    ECOG PERFORMANCE STATUS:  0 - Asymptomatic  REVIEW OF SYSTEMS: Patient denies any weight loss, fatigue, weakness, fever, chills or night sweats. Patient denies any loss of vision, blurred vision. Patient denies any ringing  of the ears or hearing loss. No irregular heartbeat. Patient denies heart murmur or history of fainting. Patient denies any chest pain or pain radiating to her upper extremities. Patient denies any shortness of breath, difficulty breathing at night, cough or hemoptysis. Patient denies any swelling in the lower legs. Patient denies any nausea vomiting, vomiting of blood, or coffee ground material in the vomitus. Patient denies any stomach pain. Patient states has had normal bowel movements no significant constipation or diarrhea. Patient denies any dysuria, hematuria or significant nocturia. Patient denies any problems walking, swelling in the joints or loss of balance. Patient denies any skin changes, loss of hair or loss of  weight. Patient denies any excessive worrying or anxiety or significant depression. Patient denies any problems with insomnia. Patient denies excessive thirst, polyuria, polydipsia. Patient denies any swollen glands, patient denies easy bruising or easy bleeding. Patient denies any recent infections, allergies or URI. Patient "s visual fields have not changed significantly in recent time.   PHYSICAL EXAM: BP 133/82   Pulse 72   Temp (!) 97 F (36.1 C) (Tympanic)   Wt 179 lb 4.8 oz (81.3 kg)   BMI 27.26 kg/m  Well-developed well-nourished patient in NAD. HEENT reveals PERLA, EOMI, discs not visualized.  Oral cavity is clear. No oral mucosal lesions are identified. Neck is clear without evidence of cervical or supraclavicular adenopathy. Lungs are clear to A&P. Cardiac examination is essentially unremarkable with regular rate and rhythm without murmur rub or thrill. Abdomen is benign with no organomegaly or masses noted. Motor sensory and DTR levels are equal and symmetric in the upper and lower extremities. Cranial nerves II through XII are grossly intact. Proprioception is intact. No peripheral adenopathy or edema is identified. No motor or sensory levels are noted. Crude visual fields are within normal range.  LABORATORY DATA: Pathology report reviewed    RADIOLOGY RESULTS: Bone scan and CT scan of abdomen pelvis reviewed compatible with above-stated findings   IMPRESSION: Stage IIb adenocarcinoma the prostate highest Gleason score of 8 (22+19) in 59 year old male  PLAN: At this time I have run the [facility] nomogram showing only a 20% chance of organ confined disease in a 77% chance of extracapsular extension as well as a 26% chance of lymph node involvement.  Based on these parameters I have offered image guided IMRT radiation therapy.  I would treat both his prostate and pelvic nodes.  We treat his prostate 54 Pearline Cables and his pelvic lymph nodes to 54 Gy over 8 weeks.  Risks and  benefits of treatment including skin reaction fatigue alteration of blood counts possible in increased lower urinary tract symptoms and diarrhea all were explained in detail to the patient.  I am asking Dr. Erlene Quan to place fiducial markers for daily image guided treatment.  I also am recommending androgen deprivation therapy and of asked Dr. Saul Fordyce office to provide Eligard at this time.  Patient comprehends my treatment plan well and has decided he wants to go for radiation treatments with external beam.  I have personally set up and ordered CT simulation after he has his fiducial markers placed.   I would like to take this opportunity to thank you for allowing me to participate in the care of your patient.Marland Kitchen

## 2020-02-26 NOTE — Telephone Encounter (Signed)
Benefits verification form faxed for Eligard/Lupron.

## 2020-02-28 ENCOUNTER — Telehealth: Payer: Self-pay | Admitting: Urology

## 2020-02-28 NOTE — Telephone Encounter (Signed)
I called and left a message for this patient to call me back to discuss scheduling his gold seed markers app 1. Is he on ASA If so need to have her CMA do a clearance before we can schedule 2. Go over instructions   Same as BX 3. Remind him to bring his markers with him  (long white box he got from cancer ctr) 4. Needs to be in a BX spot with Dr. Erlene Quan 5. He also needs to have his Eligard injection same day ( he has one scheduled that needs to be cx) I need to be made aware of app and so does [place].  Thanks, Sharyn Lull

## 2020-03-20 ENCOUNTER — Other Ambulatory Visit: Payer: Self-pay

## 2020-03-20 ENCOUNTER — Ambulatory Visit (INDEPENDENT_AMBULATORY_CARE_PROVIDER_SITE_OTHER): Payer: Medicare HMO | Admitting: Urology

## 2020-03-20 VITALS — BP 185/108 | HR 71

## 2020-03-20 DIAGNOSIS — C61 Malignant neoplasm of prostate: Secondary | ICD-10-CM | POA: Diagnosis not present

## 2020-03-20 MED ORDER — LEUPROLIDE ACETATE (6 MONTH) 45 MG ~~LOC~~ KIT
45.0000 mg | PACK | Freq: Once | SUBCUTANEOUS | Status: AC
  Administered 2020-03-20: 45 mg via SUBCUTANEOUS

## 2020-03-20 MED ORDER — GENTAMICIN SULFATE 40 MG/ML IJ SOLN
80.0000 mg | Freq: Once | INTRAMUSCULAR | Status: AC
  Administered 2020-03-20: 80 mg via INTRAMUSCULAR

## 2020-03-20 MED ORDER — LEVOFLOXACIN 500 MG PO TABS
500.0000 mg | ORAL_TABLET | Freq: Once | ORAL | Status: AC
  Administered 2020-03-20: 500 mg via ORAL

## 2020-03-20 NOTE — Progress Notes (Signed)
03/20/20  CC: gold seed markers  HPI: 59 y.o. male with prostate cancer who presents today for placement of fiducial seed markers in anticipation of his upcoming IMRT with Dr. Baruch Gouty.  He was also started on Eligard today, previously counseled on side effects.     Prostate Gold Seed Marker Placement Procedure   Informed consent was obtained after discussing risks/benefits of the procedure.  A time out was performed to ensure correct patient identity.  Pre-Procedure: - Gentamicin given prophylactically - PO Levaquin 500 mg also given today  Procedure: -Lidocaine jelly was administered per rectum -Rectal ultrasound probe was placed without difficulty and the prostate visualized - 3 fiducial gold seed markers placed, one at right base, one at left base, one at apex of prostate gland under transrectal ultrasound guidance  Post-Procedure: - Patient tolerated the procedure well - He was counseled to seek immediate medical attention if experiences any severe pain, significant bleeding, or fevers  Hollice Espy, MD

## 2020-03-21 NOTE — Progress Notes (Signed)
Eligard SubQ Injection   Due to Prostate Cancer patient is present today for a Eligard Injection.  Medication: Eligard 6 month Dose: 45 mg  Location: right  Lot: [id number] Exp: 07/11/2021  Patient tolerated well, no complications were noted  Performed by: Kerman Passey, RMA  Per Dr. Erlene Quan patient is to continue therapy for 6 months . Patient's next follow up was scheduled for 6 months. This appointment was scheduled using wheel and given to patient today along with reminder continue on Vitamin D 800-1000iu and Calium 1000-1200mg  daily while on Androgen Deprivation Therapy.  PA approval dates: ;

## 2020-03-22 ENCOUNTER — Ambulatory Visit: Payer: Medicare HMO

## 2020-03-25 ENCOUNTER — Ambulatory Visit
Admission: RE | Admit: 2020-03-25 | Discharge: 2020-03-25 | Disposition: A | Discharge status: Home / Self Care | Payer: Medicare HMO | Source: Ambulatory Visit | Attending: Radiation Oncology | Admitting: Radiation Oncology

## 2020-03-25 DIAGNOSIS — Z510 Encounter for antineoplastic radiation therapy: Secondary | ICD-10-CM | POA: Insufficient documentation

## 2020-03-25 DIAGNOSIS — C775 Secondary and unspecified malignant neoplasm of intrapelvic lymph nodes: Secondary | ICD-10-CM | POA: Diagnosis not present

## 2020-03-25 DIAGNOSIS — C61 Malignant neoplasm of prostate: Secondary | ICD-10-CM | POA: Insufficient documentation

## 2020-03-26 NOTE — Telephone Encounter (Signed)
No PA required. Ref #[id number].

## 2020-03-27 ENCOUNTER — Ambulatory Visit: Payer: Self-pay | Admitting: Urology

## 2020-03-27 DIAGNOSIS — Z510 Encounter for antineoplastic radiation therapy: Secondary | ICD-10-CM | POA: Diagnosis not present

## 2020-03-27 DIAGNOSIS — C61 Malignant neoplasm of prostate: Secondary | ICD-10-CM | POA: Diagnosis not present

## 2020-03-27 DIAGNOSIS — C775 Secondary and unspecified malignant neoplasm of intrapelvic lymph nodes: Secondary | ICD-10-CM | POA: Diagnosis not present

## 2020-03-29 ENCOUNTER — Other Ambulatory Visit: Payer: Self-pay | Admitting: *Deleted

## 2020-03-29 DIAGNOSIS — C61 Malignant neoplasm of prostate: Secondary | ICD-10-CM

## 2020-04-02 ENCOUNTER — Ambulatory Visit: Admission: RE | Admit: 2020-04-02 | Payer: Medicare HMO | Source: Ambulatory Visit

## 2020-04-03 ENCOUNTER — Ambulatory Visit
Admission: RE | Admit: 2020-04-03 | Discharge: 2020-04-03 | Disposition: A | Discharge status: Home / Self Care | Payer: Medicare HMO | Source: Ambulatory Visit | Attending: Radiation Oncology | Admitting: Radiation Oncology

## 2020-04-03 DIAGNOSIS — Z510 Encounter for antineoplastic radiation therapy: Secondary | ICD-10-CM | POA: Diagnosis not present

## 2020-04-03 DIAGNOSIS — C775 Secondary and unspecified malignant neoplasm of intrapelvic lymph nodes: Secondary | ICD-10-CM | POA: Diagnosis not present

## 2020-04-03 DIAGNOSIS — C61 Malignant neoplasm of prostate: Secondary | ICD-10-CM | POA: Diagnosis not present

## 2020-04-04 ENCOUNTER — Ambulatory Visit
Admission: RE | Admit: 2020-04-04 | Discharge: 2020-04-04 | Disposition: A | Discharge status: Home / Self Care | Payer: Medicare HMO | Source: Ambulatory Visit | Attending: Radiation Oncology | Admitting: Radiation Oncology

## 2020-04-04 DIAGNOSIS — C61 Malignant neoplasm of prostate: Secondary | ICD-10-CM | POA: Diagnosis not present

## 2020-04-04 DIAGNOSIS — C775 Secondary and unspecified malignant neoplasm of intrapelvic lymph nodes: Secondary | ICD-10-CM | POA: Diagnosis not present

## 2020-04-04 DIAGNOSIS — Z510 Encounter for antineoplastic radiation therapy: Secondary | ICD-10-CM | POA: Diagnosis not present

## 2020-04-08 ENCOUNTER — Ambulatory Visit
Admission: RE | Admit: 2020-04-08 | Discharge: 2020-04-08 | Disposition: A | Discharge status: Home / Self Care | Payer: Medicare HMO | Source: Ambulatory Visit | Attending: Radiation Oncology | Admitting: Radiation Oncology

## 2020-04-08 DIAGNOSIS — C775 Secondary and unspecified malignant neoplasm of intrapelvic lymph nodes: Secondary | ICD-10-CM | POA: Diagnosis not present

## 2020-04-08 DIAGNOSIS — Z510 Encounter for antineoplastic radiation therapy: Secondary | ICD-10-CM | POA: Diagnosis not present

## 2020-04-08 DIAGNOSIS — C61 Malignant neoplasm of prostate: Secondary | ICD-10-CM | POA: Diagnosis not present

## 2020-04-09 ENCOUNTER — Ambulatory Visit
Admission: RE | Admit: 2020-04-09 | Discharge: 2020-04-09 | Disposition: A | Discharge status: Home / Self Care | Payer: Medicare HMO | Source: Ambulatory Visit | Attending: Radiation Oncology | Admitting: Radiation Oncology

## 2020-04-09 DIAGNOSIS — C775 Secondary and unspecified malignant neoplasm of intrapelvic lymph nodes: Secondary | ICD-10-CM | POA: Diagnosis not present

## 2020-04-09 DIAGNOSIS — Z510 Encounter for antineoplastic radiation therapy: Secondary | ICD-10-CM | POA: Diagnosis not present

## 2020-04-09 DIAGNOSIS — C61 Malignant neoplasm of prostate: Secondary | ICD-10-CM | POA: Diagnosis not present

## 2020-04-10 ENCOUNTER — Ambulatory Visit
Admission: RE | Admit: 2020-04-10 | Discharge: 2020-04-10 | Disposition: A | Discharge status: Home / Self Care | Payer: Medicare HMO | Source: Ambulatory Visit | Attending: Radiation Oncology | Admitting: Radiation Oncology

## 2020-04-10 DIAGNOSIS — C61 Malignant neoplasm of prostate: Secondary | ICD-10-CM | POA: Diagnosis not present

## 2020-04-10 DIAGNOSIS — C775 Secondary and unspecified malignant neoplasm of intrapelvic lymph nodes: Secondary | ICD-10-CM | POA: Diagnosis not present

## 2020-04-10 DIAGNOSIS — Z510 Encounter for antineoplastic radiation therapy: Secondary | ICD-10-CM | POA: Diagnosis not present

## 2020-04-11 ENCOUNTER — Ambulatory Visit
Admission: RE | Admit: 2020-04-11 | Discharge: 2020-04-11 | Disposition: A | Discharge status: Home / Self Care | Payer: Medicare HMO | Source: Ambulatory Visit | Attending: Radiation Oncology | Admitting: Radiation Oncology

## 2020-04-11 DIAGNOSIS — C61 Malignant neoplasm of prostate: Secondary | ICD-10-CM | POA: Diagnosis not present

## 2020-04-11 DIAGNOSIS — C775 Secondary and unspecified malignant neoplasm of intrapelvic lymph nodes: Secondary | ICD-10-CM | POA: Diagnosis not present

## 2020-04-11 DIAGNOSIS — Z510 Encounter for antineoplastic radiation therapy: Secondary | ICD-10-CM | POA: Diagnosis not present

## 2020-04-15 ENCOUNTER — Ambulatory Visit
Admission: RE | Admit: 2020-04-15 | Discharge: 2020-04-15 | Disposition: A | Discharge status: Home / Self Care | Payer: Medicare (Managed Care) | Source: Ambulatory Visit | Attending: Radiation Oncology | Admitting: Radiation Oncology

## 2020-04-15 DIAGNOSIS — C775 Secondary and unspecified malignant neoplasm of intrapelvic lymph nodes: Secondary | ICD-10-CM | POA: Insufficient documentation

## 2020-04-15 DIAGNOSIS — Z510 Encounter for antineoplastic radiation therapy: Secondary | ICD-10-CM | POA: Insufficient documentation

## 2020-04-15 DIAGNOSIS — C61 Malignant neoplasm of prostate: Secondary | ICD-10-CM | POA: Insufficient documentation

## 2020-04-16 ENCOUNTER — Ambulatory Visit
Admission: RE | Admit: 2020-04-16 | Discharge: 2020-04-16 | Disposition: A | Discharge status: Home / Self Care | Payer: Medicare (Managed Care) | Source: Ambulatory Visit | Attending: Radiation Oncology | Admitting: Radiation Oncology

## 2020-04-16 DIAGNOSIS — Z510 Encounter for antineoplastic radiation therapy: Secondary | ICD-10-CM | POA: Diagnosis not present

## 2020-04-17 ENCOUNTER — Ambulatory Visit
Admission: RE | Admit: 2020-04-17 | Discharge: 2020-04-17 | Disposition: A | Discharge status: Home / Self Care | Payer: Medicare (Managed Care) | Source: Ambulatory Visit | Attending: Radiation Oncology | Admitting: Radiation Oncology

## 2020-04-17 ENCOUNTER — Inpatient Hospital Stay: Payer: Medicare (Managed Care) | Attending: Radiation Oncology

## 2020-04-17 DIAGNOSIS — C61 Malignant neoplasm of prostate: Secondary | ICD-10-CM | POA: Insufficient documentation

## 2020-04-17 DIAGNOSIS — Z510 Encounter for antineoplastic radiation therapy: Secondary | ICD-10-CM | POA: Diagnosis not present

## 2020-04-17 LAB — CBC
HCT: 37.2 % — ABNORMAL LOW (ref 39.0–52.0)
Hemoglobin: 13 g/dL (ref 13.0–17.0)
MCH: 32.9 pg (ref 26.0–34.0)
MCHC: 34.9 g/dL (ref 30.0–36.0)
MCV: 94.2 fL (ref 80.0–100.0)
Platelets: 168 10*3/uL (ref 150–400)
RBC: 3.95 MIL/uL — ABNORMAL LOW (ref 4.22–5.81)
RDW: 13 % (ref 11.5–15.5)
WBC: 3.9 10*3/uL — ABNORMAL LOW (ref 4.0–10.5)
nRBC: 0 % (ref 0.0–0.2)

## 2020-04-18 ENCOUNTER — Ambulatory Visit
Admission: RE | Admit: 2020-04-18 | Discharge: 2020-04-18 | Disposition: A | Discharge status: Home / Self Care | Payer: Medicare (Managed Care) | Source: Ambulatory Visit | Attending: Radiation Oncology | Admitting: Radiation Oncology

## 2020-04-18 DIAGNOSIS — Z510 Encounter for antineoplastic radiation therapy: Secondary | ICD-10-CM | POA: Diagnosis not present

## 2020-04-19 ENCOUNTER — Ambulatory Visit
Admission: RE | Admit: 2020-04-19 | Discharge: 2020-04-19 | Disposition: A | Discharge status: Home / Self Care | Payer: Medicare (Managed Care) | Source: Ambulatory Visit | Attending: Radiation Oncology | Admitting: Radiation Oncology

## 2020-04-19 DIAGNOSIS — Z510 Encounter for antineoplastic radiation therapy: Secondary | ICD-10-CM | POA: Diagnosis not present

## 2020-04-22 ENCOUNTER — Ambulatory Visit
Admission: RE | Admit: 2020-04-22 | Discharge: 2020-04-22 | Disposition: A | Discharge status: Home / Self Care | Payer: Medicare (Managed Care) | Source: Ambulatory Visit | Attending: Radiation Oncology | Admitting: Radiation Oncology

## 2020-04-22 DIAGNOSIS — Z510 Encounter for antineoplastic radiation therapy: Secondary | ICD-10-CM | POA: Diagnosis not present

## 2020-04-23 ENCOUNTER — Ambulatory Visit
Admission: RE | Admit: 2020-04-23 | Discharge: 2020-04-23 | Disposition: A | Discharge status: Home / Self Care | Payer: Medicare (Managed Care) | Source: Ambulatory Visit | Attending: Radiation Oncology | Admitting: Radiation Oncology

## 2020-04-23 DIAGNOSIS — Z510 Encounter for antineoplastic radiation therapy: Secondary | ICD-10-CM | POA: Diagnosis not present

## 2020-04-24 ENCOUNTER — Ambulatory Visit
Admission: RE | Admit: 2020-04-24 | Discharge: 2020-04-24 | Disposition: A | Discharge status: Home / Self Care | Payer: Medicare (Managed Care) | Source: Ambulatory Visit | Attending: Radiation Oncology | Admitting: Radiation Oncology

## 2020-04-24 DIAGNOSIS — Z510 Encounter for antineoplastic radiation therapy: Secondary | ICD-10-CM | POA: Diagnosis not present

## 2020-04-25 ENCOUNTER — Other Ambulatory Visit: Payer: Medicare (Managed Care)

## 2020-04-25 ENCOUNTER — Ambulatory Visit
Admission: RE | Admit: 2020-04-25 | Discharge: 2020-04-25 | Disposition: A | Discharge status: Home / Self Care | Payer: Medicare (Managed Care) | Source: Ambulatory Visit | Attending: Radiation Oncology | Admitting: Radiation Oncology

## 2020-04-25 DIAGNOSIS — Z510 Encounter for antineoplastic radiation therapy: Secondary | ICD-10-CM | POA: Diagnosis not present

## 2020-04-26 ENCOUNTER — Ambulatory Visit
Admission: RE | Admit: 2020-04-26 | Discharge: 2020-04-26 | Disposition: A | Discharge status: Home / Self Care | Payer: Medicare (Managed Care) | Source: Ambulatory Visit | Attending: Radiation Oncology | Admitting: Radiation Oncology

## 2020-04-26 DIAGNOSIS — Z510 Encounter for antineoplastic radiation therapy: Secondary | ICD-10-CM | POA: Diagnosis not present

## 2020-04-29 ENCOUNTER — Ambulatory Visit: Payer: Medicare (Managed Care)

## 2020-04-30 ENCOUNTER — Ambulatory Visit
Admission: RE | Admit: 2020-04-30 | Discharge: 2020-04-30 | Disposition: A | Discharge status: Home / Self Care | Payer: Medicare (Managed Care) | Source: Ambulatory Visit | Attending: Radiation Oncology | Admitting: Radiation Oncology

## 2020-04-30 DIAGNOSIS — Z510 Encounter for antineoplastic radiation therapy: Secondary | ICD-10-CM | POA: Diagnosis not present

## 2020-05-01 ENCOUNTER — Inpatient Hospital Stay: Payer: Medicare (Managed Care)

## 2020-05-01 ENCOUNTER — Ambulatory Visit
Admission: RE | Admit: 2020-05-01 | Discharge: 2020-05-01 | Disposition: A | Discharge status: Home / Self Care | Payer: Medicare (Managed Care) | Source: Ambulatory Visit | Attending: Radiation Oncology | Admitting: Radiation Oncology

## 2020-05-01 DIAGNOSIS — C61 Malignant neoplasm of prostate: Secondary | ICD-10-CM

## 2020-05-01 DIAGNOSIS — Z510 Encounter for antineoplastic radiation therapy: Secondary | ICD-10-CM | POA: Diagnosis not present

## 2020-05-01 LAB — CBC
HCT: 34.4 % — ABNORMAL LOW (ref 39.0–52.0)
Hemoglobin: 12.6 g/dL — ABNORMAL LOW (ref 13.0–17.0)
MCH: 33.5 pg (ref 26.0–34.0)
MCHC: 36.6 g/dL — ABNORMAL HIGH (ref 30.0–36.0)
MCV: 91.5 fL (ref 80.0–100.0)
Platelets: 156 10*3/uL (ref 150–400)
RBC: 3.76 MIL/uL — ABNORMAL LOW (ref 4.22–5.81)
RDW: 13.1 % (ref 11.5–15.5)
WBC: 4.8 10*3/uL (ref 4.0–10.5)
nRBC: 0 % (ref 0.0–0.2)

## 2020-05-02 ENCOUNTER — Ambulatory Visit
Admission: RE | Admit: 2020-05-02 | Discharge: 2020-05-02 | Disposition: A | Discharge status: Home / Self Care | Payer: Medicare (Managed Care) | Source: Ambulatory Visit | Attending: Radiation Oncology | Admitting: Radiation Oncology

## 2020-05-02 DIAGNOSIS — Z510 Encounter for antineoplastic radiation therapy: Secondary | ICD-10-CM | POA: Diagnosis not present

## 2020-05-03 ENCOUNTER — Ambulatory Visit
Admission: RE | Admit: 2020-05-03 | Discharge: 2020-05-03 | Disposition: A | Discharge status: Home / Self Care | Payer: Medicare (Managed Care) | Source: Ambulatory Visit | Attending: Radiation Oncology | Admitting: Radiation Oncology

## 2020-05-03 DIAGNOSIS — Z510 Encounter for antineoplastic radiation therapy: Secondary | ICD-10-CM | POA: Diagnosis not present

## 2020-05-06 ENCOUNTER — Ambulatory Visit
Admission: RE | Admit: 2020-05-06 | Discharge: 2020-05-06 | Disposition: A | Discharge status: Home / Self Care | Payer: Medicare (Managed Care) | Source: Ambulatory Visit | Attending: Radiation Oncology | Admitting: Radiation Oncology

## 2020-05-06 DIAGNOSIS — Z510 Encounter for antineoplastic radiation therapy: Secondary | ICD-10-CM | POA: Diagnosis not present

## 2020-05-07 ENCOUNTER — Ambulatory Visit
Admission: RE | Admit: 2020-05-07 | Discharge: 2020-05-07 | Disposition: A | Discharge status: Home / Self Care | Payer: Medicare (Managed Care) | Source: Ambulatory Visit | Attending: Radiation Oncology | Admitting: Radiation Oncology

## 2020-05-07 DIAGNOSIS — Z510 Encounter for antineoplastic radiation therapy: Secondary | ICD-10-CM | POA: Diagnosis not present

## 2020-05-08 ENCOUNTER — Ambulatory Visit
Admission: RE | Admit: 2020-05-08 | Discharge: 2020-05-08 | Disposition: A | Discharge status: Home / Self Care | Payer: Medicare (Managed Care) | Source: Ambulatory Visit | Attending: Radiation Oncology | Admitting: Radiation Oncology

## 2020-05-08 DIAGNOSIS — Z510 Encounter for antineoplastic radiation therapy: Secondary | ICD-10-CM | POA: Diagnosis not present

## 2020-05-09 ENCOUNTER — Ambulatory Visit
Admission: RE | Admit: 2020-05-09 | Discharge: 2020-05-09 | Disposition: A | Discharge status: Home / Self Care | Payer: Medicare (Managed Care) | Source: Ambulatory Visit | Attending: Radiation Oncology | Admitting: Radiation Oncology

## 2020-05-09 DIAGNOSIS — Z510 Encounter for antineoplastic radiation therapy: Secondary | ICD-10-CM | POA: Diagnosis not present

## 2020-05-10 ENCOUNTER — Ambulatory Visit
Admission: RE | Admit: 2020-05-10 | Discharge: 2020-05-10 | Disposition: A | Discharge status: Home / Self Care | Payer: Medicare (Managed Care) | Source: Ambulatory Visit | Attending: Radiation Oncology | Admitting: Radiation Oncology

## 2020-05-10 DIAGNOSIS — Z510 Encounter for antineoplastic radiation therapy: Secondary | ICD-10-CM | POA: Diagnosis not present

## 2020-05-13 ENCOUNTER — Ambulatory Visit
Admission: RE | Admit: 2020-05-13 | Discharge: 2020-05-13 | Disposition: A | Discharge status: Home / Self Care | Payer: Medicare (Managed Care) | Source: Ambulatory Visit | Attending: Radiation Oncology | Admitting: Radiation Oncology

## 2020-05-13 DIAGNOSIS — Z510 Encounter for antineoplastic radiation therapy: Secondary | ICD-10-CM | POA: Diagnosis not present

## 2020-05-14 ENCOUNTER — Ambulatory Visit
Admission: RE | Admit: 2020-05-14 | Discharge: 2020-05-14 | Disposition: A | Discharge status: Home / Self Care | Payer: Medicare Other | Source: Ambulatory Visit | Attending: Radiation Oncology | Admitting: Radiation Oncology

## 2020-05-14 DIAGNOSIS — Z510 Encounter for antineoplastic radiation therapy: Secondary | ICD-10-CM | POA: Insufficient documentation

## 2020-05-14 DIAGNOSIS — C61 Malignant neoplasm of prostate: Secondary | ICD-10-CM | POA: Diagnosis present

## 2020-05-14 DIAGNOSIS — C775 Secondary and unspecified malignant neoplasm of intrapelvic lymph nodes: Secondary | ICD-10-CM | POA: Insufficient documentation

## 2020-05-15 ENCOUNTER — Ambulatory Visit
Admission: RE | Admit: 2020-05-15 | Discharge: 2020-05-15 | Disposition: A | Discharge status: Home / Self Care | Payer: Medicare Other | Source: Ambulatory Visit | Attending: Radiation Oncology | Admitting: Radiation Oncology

## 2020-05-15 ENCOUNTER — Inpatient Hospital Stay: Payer: Medicare Other | Attending: Radiation Oncology

## 2020-05-15 DIAGNOSIS — Z510 Encounter for antineoplastic radiation therapy: Secondary | ICD-10-CM | POA: Diagnosis not present

## 2020-05-16 ENCOUNTER — Ambulatory Visit
Admission: RE | Admit: 2020-05-16 | Discharge: 2020-05-16 | Disposition: A | Discharge status: Home / Self Care | Payer: Medicare Other | Source: Ambulatory Visit | Attending: Radiation Oncology | Admitting: Radiation Oncology

## 2020-05-16 DIAGNOSIS — Z510 Encounter for antineoplastic radiation therapy: Secondary | ICD-10-CM | POA: Diagnosis not present

## 2020-05-17 ENCOUNTER — Ambulatory Visit
Admission: RE | Admit: 2020-05-17 | Discharge: 2020-05-17 | Disposition: A | Discharge status: Home / Self Care | Payer: Medicare Other | Source: Ambulatory Visit | Attending: Radiation Oncology | Admitting: Radiation Oncology

## 2020-05-17 DIAGNOSIS — Z510 Encounter for antineoplastic radiation therapy: Secondary | ICD-10-CM | POA: Diagnosis not present

## 2020-05-20 ENCOUNTER — Ambulatory Visit
Admission: RE | Admit: 2020-05-20 | Discharge: 2020-05-20 | Disposition: A | Discharge status: Home / Self Care | Payer: Medicare Other | Source: Ambulatory Visit | Attending: Radiation Oncology | Admitting: Radiation Oncology

## 2020-05-20 DIAGNOSIS — Z510 Encounter for antineoplastic radiation therapy: Secondary | ICD-10-CM | POA: Diagnosis not present

## 2020-05-21 ENCOUNTER — Ambulatory Visit
Admission: RE | Admit: 2020-05-21 | Discharge: 2020-05-21 | Disposition: A | Discharge status: Home / Self Care | Payer: Medicare Other | Source: Ambulatory Visit | Attending: Radiation Oncology | Admitting: Radiation Oncology

## 2020-05-21 DIAGNOSIS — Z510 Encounter for antineoplastic radiation therapy: Secondary | ICD-10-CM | POA: Diagnosis not present

## 2020-05-22 ENCOUNTER — Ambulatory Visit
Admission: RE | Admit: 2020-05-22 | Discharge: 2020-05-22 | Disposition: A | Discharge status: Home / Self Care | Payer: Medicare Other | Source: Ambulatory Visit | Attending: Radiation Oncology | Admitting: Radiation Oncology

## 2020-05-22 DIAGNOSIS — Z510 Encounter for antineoplastic radiation therapy: Secondary | ICD-10-CM | POA: Diagnosis not present

## 2020-05-23 ENCOUNTER — Ambulatory Visit
Admission: RE | Admit: 2020-05-23 | Discharge: 2020-05-23 | Disposition: A | Discharge status: Home / Self Care | Payer: Medicare Other | Source: Ambulatory Visit | Attending: Radiation Oncology | Admitting: Radiation Oncology

## 2020-05-23 DIAGNOSIS — Z510 Encounter for antineoplastic radiation therapy: Secondary | ICD-10-CM | POA: Diagnosis not present

## 2020-05-24 ENCOUNTER — Ambulatory Visit
Admission: RE | Admit: 2020-05-24 | Discharge: 2020-05-24 | Disposition: A | Discharge status: Home / Self Care | Payer: Medicare Other | Source: Ambulatory Visit | Attending: Radiation Oncology | Admitting: Radiation Oncology

## 2020-05-24 DIAGNOSIS — Z510 Encounter for antineoplastic radiation therapy: Secondary | ICD-10-CM | POA: Diagnosis not present

## 2020-05-27 ENCOUNTER — Ambulatory Visit
Admission: RE | Admit: 2020-05-27 | Discharge: 2020-05-27 | Disposition: A | Discharge status: Home / Self Care | Payer: Medicare Other | Source: Ambulatory Visit | Attending: Radiation Oncology | Admitting: Radiation Oncology

## 2020-05-27 DIAGNOSIS — Z510 Encounter for antineoplastic radiation therapy: Secondary | ICD-10-CM | POA: Diagnosis not present

## 2020-05-28 ENCOUNTER — Ambulatory Visit
Admission: RE | Admit: 2020-05-28 | Discharge: 2020-05-28 | Disposition: A | Discharge status: Home / Self Care | Payer: Medicare Other | Source: Ambulatory Visit | Attending: Radiation Oncology | Admitting: Radiation Oncology

## 2020-05-28 DIAGNOSIS — Z510 Encounter for antineoplastic radiation therapy: Secondary | ICD-10-CM | POA: Diagnosis not present

## 2020-05-29 ENCOUNTER — Ambulatory Visit
Admission: RE | Admit: 2020-05-29 | Discharge: 2020-05-29 | Disposition: A | Discharge status: Home / Self Care | Payer: Medicare Other | Source: Ambulatory Visit | Attending: Radiation Oncology | Admitting: Radiation Oncology

## 2020-05-29 DIAGNOSIS — Z510 Encounter for antineoplastic radiation therapy: Secondary | ICD-10-CM | POA: Diagnosis not present

## 2020-05-30 ENCOUNTER — Ambulatory Visit
Admission: RE | Admit: 2020-05-30 | Discharge: 2020-05-30 | Disposition: A | Discharge status: Home / Self Care | Payer: Medicare Other | Source: Ambulatory Visit | Attending: Radiation Oncology | Admitting: Radiation Oncology

## 2020-05-30 ENCOUNTER — Ambulatory Visit: Payer: Medicare Other

## 2020-05-30 DIAGNOSIS — Z510 Encounter for antineoplastic radiation therapy: Secondary | ICD-10-CM | POA: Diagnosis not present

## 2020-05-31 ENCOUNTER — Encounter: Payer: Self-pay | Admitting: *Deleted

## 2020-05-31 ENCOUNTER — Ambulatory Visit
Admission: RE | Admit: 2020-05-31 | Discharge: 2020-05-31 | Disposition: A | Discharge status: Home / Self Care | Payer: Medicare Other | Source: Ambulatory Visit | Attending: Radiation Oncology | Admitting: Radiation Oncology

## 2020-05-31 DIAGNOSIS — Z510 Encounter for antineoplastic radiation therapy: Secondary | ICD-10-CM | POA: Diagnosis not present

## 2020-07-03 ENCOUNTER — Ambulatory Visit: Payer: Medicare Other | Admitting: Radiation Oncology

## 2020-07-03 ENCOUNTER — Telehealth: Payer: Self-pay | Admitting: Radiation Oncology

## 2020-07-03 NOTE — Telephone Encounter (Signed)
Message from pt transferred from triage nurse--pt stated that he missed his appt today and would like a call back to reschedule.

## 2020-07-05 ENCOUNTER — Ambulatory Visit
Admission: RE | Admit: 2020-07-05 | Discharge: 2020-07-05 | Disposition: A | Discharge status: Home / Self Care | Payer: Medicare Other | Source: Ambulatory Visit | Attending: Radiation Oncology | Admitting: Radiation Oncology

## 2020-07-05 ENCOUNTER — Encounter: Payer: Self-pay | Admitting: Radiation Oncology

## 2020-07-05 ENCOUNTER — Other Ambulatory Visit: Payer: Self-pay | Admitting: *Deleted

## 2020-07-05 VITALS — BP 157/92 | HR 80 | Temp 97.0°F | Wt 183.0 lb

## 2020-07-05 DIAGNOSIS — C61 Malignant neoplasm of prostate: Secondary | ICD-10-CM

## 2020-07-05 NOTE — Progress Notes (Signed)
Radiation Oncology Follow up Note  Name: Edward Weber   Date:   07/05/2020 MRN:  [id number] DOB: 1960-09-18    This 60 y.o. male presents to the clinic today for 1 month follow-up status post.  Radiation therapy to both his prostate and pelvic nodes for a Gleason 8 (3+5) presenting with a PSA of 12.  REFERRING PROVIDER: [name], MD  HPI: Patient is a 60 year old male now out 1 month having completed IMRT radiation therapy to both his prostate and pelvic nodes for Gleason 8 (3+5) presenting with a PSA of 12 seen today in routine follow-up he is still somewhat fatigued.  I think this is his normal baseline.  He specifically denies any increased lower urinary tract symptoms diarrhea..  COMPLICATIONS OF TREATMENT: none  FOLLOW UP COMPLIANCE: keeps appointments   PHYSICAL EXAM:  BP (!) 157/92    Pulse 80    Temp (!) 97 F (36.1 C) (Tympanic)    Wt 183 lb (83 kg)    BMI 27.83 kg/m  Well-developed well-nourished patient in NAD. HEENT reveals PERLA, EOMI, discs not visualized.  Oral cavity is clear. No oral mucosal lesions are identified. Neck is clear without evidence of cervical or supraclavicular adenopathy. Lungs are clear to A&P. Cardiac examination is essentially unremarkable with regular rate and rhythm without murmur rub or thrill. Abdomen is benign with no organomegaly or masses noted. Motor sensory and DTR levels are equal and symmetric in the upper and lower extremities. Cranial nerves II through XII are grossly intact. Proprioception is intact. No peripheral adenopathy or edema is identified. No motor or sensory levels are noted. Crude visual fields are within normal range.  RADIOLOGY RESULTS: No current films to review  PLAN: Present time patient is doing well with low side effect profile from radiation therapy.  And pleased with his overall progress.  We will start tracking his PSA in 3 months and have asked to see him back at that time.  Would like to continue ADT therapy  which will be provided through urology.  Patient already has follow-up appointments with urology.  Patient knows to call with any concerns.  I would like to take this opportunity to thank you for allowing me to participate in the care of your patient.Noreene Filbert, MD

## 2020-07-30 ENCOUNTER — Other Ambulatory Visit: Payer: Self-pay | Admitting: Family Medicine

## 2020-07-30 DIAGNOSIS — R972 Elevated prostate specific antigen [PSA]: Secondary | ICD-10-CM

## 2020-07-30 DIAGNOSIS — C61 Malignant neoplasm of prostate: Secondary | ICD-10-CM

## 2020-09-18 ENCOUNTER — Other Ambulatory Visit: Payer: Self-pay

## 2020-09-20 ENCOUNTER — Encounter: Payer: Self-pay | Admitting: Urology

## 2020-09-24 ENCOUNTER — Telehealth: Payer: Self-pay | Admitting: Urology

## 2020-09-24 ENCOUNTER — Ambulatory Visit: Payer: Self-pay | Admitting: Urology

## 2020-09-24 NOTE — Telephone Encounter (Signed)
Scheduled pt. Pt aware of importance.

## 2020-09-24 NOTE — Telephone Encounter (Signed)
No show today for ADT / PSA.  Please call him to reschedule f/u and stress the importance in timely cancer treatments.    Hollice Espy, MD

## 2020-10-01 ENCOUNTER — Ambulatory Visit: Payer: Self-pay | Admitting: Urology

## 2020-10-04 ENCOUNTER — Other Ambulatory Visit: Payer: Self-pay | Admitting: *Deleted

## 2020-10-04 DIAGNOSIS — C61 Malignant neoplasm of prostate: Secondary | ICD-10-CM

## 2020-10-07 ENCOUNTER — Ambulatory Visit: Payer: Medicare Other | Admitting: Radiation Oncology

## 2020-10-07 ENCOUNTER — Inpatient Hospital Stay: Payer: Medicare Other | Attending: Radiation Oncology

## 2020-10-08 ENCOUNTER — Other Ambulatory Visit: Payer: Medicare Other

## 2020-10-11 ENCOUNTER — Encounter: Payer: Self-pay | Admitting: Urology

## 2020-10-24 ENCOUNTER — Encounter: Payer: Self-pay | Admitting: Urology

## 2020-10-24 ENCOUNTER — Ambulatory Visit (INDEPENDENT_AMBULATORY_CARE_PROVIDER_SITE_OTHER): Payer: Medicare Other | Admitting: Urology

## 2020-10-24 ENCOUNTER — Other Ambulatory Visit: Payer: Self-pay

## 2020-10-24 DIAGNOSIS — N5235 Erectile dysfunction following radiation therapy: Secondary | ICD-10-CM | POA: Diagnosis not present

## 2020-10-24 DIAGNOSIS — C61 Malignant neoplasm of prostate: Secondary | ICD-10-CM

## 2020-10-24 MED ORDER — LEUPROLIDE ACETATE (6 MONTH) 45 MG ~~LOC~~ KIT
45.0000 mg | PACK | Freq: Once | SUBCUTANEOUS | Status: AC
  Administered 2020-10-24: 45 mg via SUBCUTANEOUS

## 2020-10-24 MED ORDER — SILDENAFIL CITRATE 20 MG PO TABS: ORAL_TABLET | ORAL | 6 refills | Status: DC

## 2020-10-24 NOTE — Progress Notes (Signed)
10/24/2020 1:40 PM   Edward Weber Edward Weber May 11, 1960 [id number]  Referring provider: [name], MD [place],  [place] [address]  Chief Complaint  Patient presents with   Prostate Cancer    HPI: 60 year old male with high risk prostate cancer returns today for 26-month follow-up.  Notably, his PSA rose to 13.3 to in 12/2019 at which time he underwent prostate biopsy indicating high risk prostate cancer.  He underwent a prostate biopsy on 02/07/2020. Pathology revealed high volume Gleason score (4+3, 3+4, 3+5) in 11/12 cores.  CT scan and bone scan were negative for metastatic disease.  He ultimately like to undergo IMRT with ADT.  He has final treatment of radiation in 05/2020.  He is overdue for follow-up today.  He no-show to last appointment.  He has not had a PSA in the interim.  Today, he is accompanied by his girlfriend.  He has been having some hot flashes from time to time but otherwise is doing well.  He denies any urinary issues.  He tolerated radiation well.  He is concerned today with his erections.  He is having more difficulty maintaining and achieving his erections.  He would like a prescription today for sildenafil.   PMH: Past Medical History:  Diagnosis Date   Alcoholism ([place])    Allergic rhinitis    Allergic state    Anemia    Arthritis    Cancer (HCC)    Chronic hepatitis C (HCC)    Depression    Esophagitis, reflux    GERD (gastroesophageal reflux disease)    Helicobacter pylori gastritis    Hepatitis    Hypertension    Neuromuscular disorder (HCC)    Osteoarthrosis    Peripheral neuropathy    Rhabdomyolysis    Sickle cell anemia (HCC)    Stroke (HCC)    Stroke [facility])    Vitiligo     Surgical History: Past Surgical History:  Procedure Laterality Date   COLON SURGERY     COLONOSCOPY WITH PROPOFOL N/A 05/09/2015   Procedure: COLONOSCOPY WITH PROPOFOL;  Surgeon: Hulen Luster, MD;  Location: [facility] ENDOSCOPY;  Service:  Gastroenterology;  Laterality: N/A;   ESOPHAGOGASTRODUODENOSCOPY N/A 05/09/2015   Procedure: ESOPHAGOGASTRODUODENOSCOPY (EGD);  Surgeon: Hulen Luster, MD;  Location: [facility] ENDOSCOPY;  Service: Gastroenterology;  Laterality: N/A;   ESOPHAGOGASTRODUODENOSCOPY (EGD) WITH PROPOFOL N/A 05/04/2017   Procedure: ESOPHAGOGASTRODUODENOSCOPY (EGD) WITH PROPOFOL;  Surgeon: Toledo, Benay Pike, MD;  Location: [facility] ENDOSCOPY;  Service: Gastroenterology;  Laterality: N/A;   FINGER DEBRIDEMENT  1976   s/p infected dog bite   TEE WITHOUT CARDIOVERSION N/A 05/20/2012   Procedure: TRANSESOPHAGEAL ECHOCARDIOGRAM (TEE);  Surgeon: Fay Records, MD;  Location: [facility] ENDOSCOPY;  Service: Cardiovascular;  Laterality: N/A;    Home Medications:  Allergies as of 10/24/2020       Reactions   Beef-derived Products    Personal preference   Lactalbumin    Lactase Nausea And Vomiting   Intoloerance   Milk-related Compounds Nausea And Vomiting   Intoloerance   Pegademase Bovine    Other reaction(s): Unknown Personal preference Personal preference Personal preference        Medication List        Accurate as of October 24, 2020  1:40 PM. If you have any questions, ask your nurse or doctor.          albuterol 108 (90 Base) MCG/ACT inhaler Commonly known as: VENTOLIN HFA Inhale into the lungs every 6 (six) hours as  needed for wheezing or shortness of breath.   allopurinol 100 MG tablet Commonly known as: ZYLOPRIM Take 100 mg by mouth 2 (two) times daily.   amLODipine 5 MG tablet Commonly known as: NORVASC   clobetasol cream 0.05 % Commonly known as: TEMOVATE Apply 1 application topically 2 (two) times daily.   colchicine 0.6 MG tablet Take 1 tablet (0.6 mg total) by mouth 2 (two) times daily.   cyanocobalamin 1000 MCG tablet Take 1,000 mcg by mouth daily.   doxepin 25 MG capsule Commonly known as: SINEQUAN Take 1 capsule (25 mg total) by mouth at bedtime.   DULoxetine 60 MG capsule Commonly known as:  CYMBALTA Take 1 capsule (60 mg total) by mouth daily.   escitalopram 20 MG tablet Commonly known as: LEXAPRO Take 20 mg by mouth daily.   famotidine 20 MG tablet Commonly known as: PEPCID Take 20 mg by mouth 2 (two) times daily.   ferrous gluconate 324 MG tablet Commonly known as: FERGON Take 324 mg by mouth daily with breakfast.   fluticasone 50 MCG/ACT nasal spray Commonly known as: FLONASE Place 2 sprays into both nostrils daily.   gabapentin 300 MG capsule Commonly known as: NEURONTIN Take 1 capsule (300 mg total) by mouth 2 (two) times daily.   gabapentin 100 MG capsule Commonly known as: NEURONTIN   hydrochlorothiazide 25 MG tablet Commonly known as: HYDRODIURIL Take 1 tablet (25 mg total) by mouth daily with breakfast.   hydrocortisone cream 1 % Apply 1 application topically daily as needed for itching.   hydrOXYzine 25 MG tablet Commonly known as: ATARAX/VISTARIL Take 25 mg by mouth 3 (three) times daily as needed. For itching   lansoprazole 15 MG capsule Commonly known as: PREVACID Take 15 mg by mouth daily as needed. For acid reflux   lisinopril-hydrochlorothiazide 20-12.5 MG tablet Commonly known as: ZESTORETIC   meloxicam 15 MG tablet Commonly known as: MOBIC Take 1 tablet (15 mg total) by mouth daily.   multivitamin tablet Take 1 tablet by mouth daily.   naproxen 500 MG tablet Commonly known as: Naprosyn Take 1 tablet (500 mg total) by mouth 2 (two) times daily with a meal.   oxyCODONE-acetaminophen 5-325 MG tablet Commonly known as: Roxicet Take 1-2 tablets by mouth every 4 (four) hours as needed (Pain).   pantoprazole 40 MG tablet Commonly known as: Protonix Take 1 tablet (40 mg total) by mouth daily.   predniSONE 20 MG tablet Commonly known as: DELTASONE Take 20 mg by mouth daily with breakfast.   ranitidine 150 MG capsule Commonly known as: ZANTAC Take 150 mg by mouth 2 (two) times daily.   senna-docusate 8.6-50 MG  tablet Commonly known as: Senokot-S Take 1 tablet by mouth 2 (two) times daily as needed for mild constipation.   sildenafil 20 MG tablet Commonly known as: REVATIO Take 3-5 tablets as needed 1 hour prior intercourse What changed: See the new instructions. Changed by: Hollice Espy, MD   sucralfate 1 g tablet Commonly known as: Carafate Take 1 tablet (1 g total) by mouth 4 (four) times daily.   thiamine 100 MG tablet Take 100 mg by mouth daily.   tiZANidine 4 MG capsule Commonly known as: ZANAFLEX Take 4 mg by mouth 3 (three) times daily.        Allergies:  Allergies  Allergen Reactions   Beef-Derived Products     Personal preference   Lactalbumin    Lactase Nausea And Vomiting    Intoloerance   Milk-Related Compounds Nausea  And Vomiting    Intoloerance   Pegademase Bovine     Other reaction(s): Unknown Personal preference Personal preference Personal preference     Family History: History reviewed. No pertinent family history.  Social History:  reports that he has been smoking cigarettes. He has never used smokeless tobacco. He reports current alcohol use of about 9.0 standard drinks of alcohol per week. He reports current drug use. Drugs: Marijuana and Cocaine.   Physical Exam: BP 120/72   Pulse 83   Ht 5\' 8"  (1.727 m)   Wt 179 lb (81.2 kg)   BMI 27.22 kg/m   Constitutional:  Alert and oriented, No acute distress. HEENT: [place] AT, moist mucus membranes.  Trachea midline, no masses. Cardiovascular: No clubbing, cyanosis, or edema. Respiratory: Normal respiratory effort, no increased work of breathing. Skin: No rashes, bruises or suspicious lesions. Neurologic: Grossly intact, no focal deficits, moving all 4 extremities. Psychiatric: Normal mood and affect.  Laboratory Data: Lab Results  Component Value Date   WBC 4.8 05/01/2020   HGB 12.6 (L) 05/01/2020   HCT 34.4 (L) 05/01/2020   MCV 91.5 05/01/2020   PLT 156 05/01/2020    Lab Results   Component Value Date   CREATININE 1.09 02/13/2020    Assessment & Plan:    1. Prostate cancer [facility]) overdue for PSA, drawn today and is pending  He was administered another Depo of leuprolide, 1-month injection  Reviewed bone and cardiovascular health prevention recommendations.  Discussed hot flashes, desires no intervention at this time.  Plan to continue ADT for at least 2 years  - PSA - leuprolide (6 Month) (ELIGARD) injection 45 mg - sildenafil (REVATIO) 20 MG tablet; Take 3-5 tablets as needed 1 hour prior intercourse  Dispense: 30 tablet; Refill: 6  2. Erectile dysfunction following radiation therapy Prescribed sildenafil, discussed optimal administration and possible side effects - PSA - sildenafil (REVATIO) 20 MG tablet; Take 3-5 tablets as needed 1 hour prior intercourse  Dispense: 30 tablet; Refill: 6  F/u 6 months with PSA prior  Hollice Espy, MD  [facility] [address] [place], [place] [address] [phone]

## 2020-10-24 NOTE — Progress Notes (Signed)
Eligard SubQ Injection   Due to Prostate Cancer patient is present today for a Eligard Injection.  Medication: Eligard 6 month Dose: 45 mg  Location: right lower abdominal  Lot: [id number] Exp: 05/2022  Patient tolerated well, no complications were noted  Performed by: Verlene Mayer, [place]  Per Dr. Erlene Quan patient is to continue therapy for 6 months . Patient's next follow up was scheduled for 05/06/20. This appointment was scheduled using wheel and given to patient today along with reminder continue on Vitamin D 800-1000iu and Calium 1000-1200mg  daily while on Androgen Deprivation Therapy.   PA approval dates: No approval Required

## 2020-10-25 ENCOUNTER — Telehealth: Payer: Self-pay

## 2020-10-25 LAB — PSA: Prostate Specific Ag, Serum: <0.1 ng/mL (ref 0.0–4.0)

## 2020-10-25 NOTE — Telephone Encounter (Signed)
Patient notified

## 2020-10-25 NOTE — Telephone Encounter (Signed)
-----   Message from Hollice Espy, MD sent at 10/25/2020 10:58 AM EDT ----- Your PSA is undetectable, great news  Hollice Espy, MD

## 2020-10-27 ENCOUNTER — Emergency Department
Admission: EM | Admit: 2020-10-27 | Discharge: 2020-10-27 | Disposition: A | Discharge status: Home / Self Care | Payer: Medicare Other | Attending: Emergency Medicine | Admitting: Emergency Medicine

## 2020-10-27 DIAGNOSIS — T63441A Toxic effect of venom of bees, accidental (unintentional), initial encounter: Secondary | ICD-10-CM | POA: Insufficient documentation

## 2020-10-27 DIAGNOSIS — F17210 Nicotine dependence, cigarettes, uncomplicated: Secondary | ICD-10-CM | POA: Insufficient documentation

## 2020-10-27 DIAGNOSIS — I10 Essential (primary) hypertension: Secondary | ICD-10-CM | POA: Diagnosis not present

## 2020-10-27 DIAGNOSIS — Z859 Personal history of malignant neoplasm, unspecified: Secondary | ICD-10-CM | POA: Insufficient documentation

## 2020-10-27 DIAGNOSIS — Z79899 Other long term (current) drug therapy: Secondary | ICD-10-CM | POA: Diagnosis not present

## 2020-10-27 MED ORDER — DIPHENHYDRAMINE HCL 50 MG/ML IJ SOLN
25.0000 mg | Freq: Once | INTRAMUSCULAR | Status: AC
  Administered 2020-10-27: 25 mg via INTRAVENOUS
  Filled 2020-10-27: qty 1

## 2020-10-27 MED ORDER — METHYLPREDNISOLONE SODIUM SUCC 125 MG IJ SOLR
125.0000 mg | Freq: Once | INTRAMUSCULAR | Status: AC
  Administered 2020-10-27: 125 mg via INTRAVENOUS
  Filled 2020-10-27: qty 2

## 2020-10-27 MED ORDER — FAMOTIDINE IN NACL 20-0.9 MG/50ML-% IV SOLN
20.0000 mg | Freq: Once | INTRAVENOUS | Status: AC
  Administered 2020-10-27: 20 mg via INTRAVENOUS
  Filled 2020-10-27: qty 50
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
## 2020-10-27 NOTE — ED Triage Notes (Addendum)
Pt stung by bee approx 1 hour prior to arrival on the bottom lip. Pt took 50mg  Benadryl 30 minutes prior to arrival. Pt has swelling to the lower lip and all along bottom jaw. Pt able to swallow with no pain or difficulty. Tongue not swollen. Pt having hard time keeping mouth closed and has the feeling he may start to drool. Oxygen RA @ 93%.

## 2020-11-30 ENCOUNTER — Other Ambulatory Visit: Payer: Self-pay

## 2020-11-30 ENCOUNTER — Emergency Department
Admission: EM | Admit: 2020-11-30 | Discharge: 2020-11-30 | Disposition: A | Discharge status: Home / Self Care | Payer: Medicare Other | Attending: Emergency Medicine | Admitting: Emergency Medicine

## 2020-11-30 DIAGNOSIS — R112 Nausea with vomiting, unspecified: Secondary | ICD-10-CM | POA: Insufficient documentation

## 2020-11-30 DIAGNOSIS — R55 Syncope and collapse: Secondary | ICD-10-CM | POA: Insufficient documentation

## 2020-11-30 DIAGNOSIS — Z5321 Procedure and treatment not carried out due to patient leaving prior to being seen by health care provider: Secondary | ICD-10-CM | POA: Diagnosis not present

## 2020-11-30 LAB — CBC
HCT: 35.9 % — ABNORMAL LOW (ref 39.0–52.0)
Hemoglobin: 12.2 g/dL — ABNORMAL LOW (ref 13.0–17.0)
MCH: 32.1 pg (ref 26.0–34.0)
MCHC: 34 g/dL (ref 30.0–36.0)
MCV: 94.5 fL (ref 80.0–100.0)
Platelets: 289 10*3/uL (ref 150–400)
RBC: 3.8 MIL/uL — ABNORMAL LOW (ref 4.22–5.81)
RDW: 13.6 % (ref 11.5–15.5)
WBC: 8.3 10*3/uL (ref 4.0–10.5)
nRBC: 0 % (ref 0.0–0.2)

## 2020-11-30 LAB — BASIC METABOLIC PANEL
Anion gap: 13 (ref 5–15)
BUN: 24 mg/dL — ABNORMAL HIGH (ref 8–23)
CO2: 19 mmol/L — ABNORMAL LOW (ref 22–32)
Calcium: 9.3 mg/dL (ref 8.9–10.3)
Chloride: 106 mmol/L (ref 98–111)
Creatinine, Ser: 1.76 mg/dL — ABNORMAL HIGH (ref 0.61–1.24)
GFR, Estimated: 43 mL/min — ABNORMAL LOW (ref >60)
Glucose, Bld: 118 mg/dL — ABNORMAL HIGH (ref 70–99)
Potassium: 4.1 mmol/L (ref 3.5–5.1)
Sodium: 138 mmol/L (ref 135–145)
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
## 2020-12-02 ENCOUNTER — Telehealth: Payer: Self-pay | Admitting: Emergency Medicine

## 2020-12-02 NOTE — Telephone Encounter (Signed)
Called patient due to left emergency department before provider exam to inquire about condition and follow up plans.  He has appt Wednesday with pcp.

## 2021-03-18 DIAGNOSIS — C61 Malignant neoplasm of prostate: Secondary | ICD-10-CM | POA: Insufficient documentation

## 2021-04-09 ENCOUNTER — Other Ambulatory Visit: Payer: Self-pay

## 2021-04-09 DIAGNOSIS — C61 Malignant neoplasm of prostate: Secondary | ICD-10-CM

## 2021-04-10 ENCOUNTER — Telehealth: Payer: Self-pay

## 2021-04-10 NOTE — Telephone Encounter (Signed)
Eligard PA via [facility], Dates: 04/10/21 - 04/10/22.

## 2021-04-28 ENCOUNTER — Other Ambulatory Visit: Payer: Self-pay

## 2021-04-28 ENCOUNTER — Other Ambulatory Visit: Payer: Medicare Other

## 2021-04-28 DIAGNOSIS — C61 Malignant neoplasm of prostate: Secondary | ICD-10-CM

## 2021-04-29 LAB — PSA: Prostate Specific Ag, Serum: <0.1 ng/mL (ref 0.0–4.0)

## 2021-05-05 NOTE — Progress Notes (Signed)
05/06/21 11:53 AM   Edward Weber Edward Weber May 26, 1960 [id number]  Referring provider:  [name], MD [place],  [place] [address] Chief Complaint  Patient presents with   Prostate Cancer     HPI: Edward Weber is a 61 y.o.male with a personal history of high risk prostate cancer, who presents today 6 month follow-up with PSA prior and Eligard.   Notably, his PSA rose to 13.3 to in 12/2019 at which time he underwent prostate biopsy indicating high risk prostate cancer.  He underwent a prostate biopsy on 02/07/2020. Pathology revealed high volume Gleason score (4+3, 3+4, 3+5) in 11/12 cores.   CT scan and bone scan were negative for metastatic disease.  He ultimately like to undergo IMRT with ADT.  He has final treatment of radiation in 05/2020.  PSA on 05/06/2021 was undetectable.   He is accompanied today by his partner. He reports today that he has been having hot flashes around 6-7 a day.  These are very intense and do not seem to be improving.  He has no other urinary issues.    He is concerned today with his erections.  He is having more difficulty maintaining and achieving his erections. He is currently on sildenafil and has found no improvement. He is bothered by this.   PMH: Past Medical History:  Diagnosis Date   Alcoholism ([place])    Allergic rhinitis    Allergic state    Anemia    Arthritis    Cancer (HCC)    Chronic hepatitis C (HCC)    Depression    Esophagitis, reflux    GERD (gastroesophageal reflux disease)    Helicobacter pylori gastritis    Hepatitis    Hypertension    Neuromuscular disorder (HCC)    Osteoarthrosis    Peripheral neuropathy    Rhabdomyolysis    Sickle cell anemia (HCC)    Stroke (HCC)    Stroke [facility])    Vitiligo     Surgical History: Past Surgical History:  Procedure Laterality Date   COLON SURGERY     COLONOSCOPY WITH PROPOFOL N/A 05/09/2015   Procedure: COLONOSCOPY WITH PROPOFOL;  Surgeon: Hulen Luster, MD;   Location: [facility] ENDOSCOPY;  Service: Gastroenterology;  Laterality: N/A;   ESOPHAGOGASTRODUODENOSCOPY N/A 05/09/2015   Procedure: ESOPHAGOGASTRODUODENOSCOPY (EGD);  Surgeon: Hulen Luster, MD;  Location: [facility] ENDOSCOPY;  Service: Gastroenterology;  Laterality: N/A;   ESOPHAGOGASTRODUODENOSCOPY (EGD) WITH PROPOFOL N/A 05/04/2017   Procedure: ESOPHAGOGASTRODUODENOSCOPY (EGD) WITH PROPOFOL;  Surgeon: Toledo, Benay Pike, MD;  Location: [facility] ENDOSCOPY;  Service: Gastroenterology;  Laterality: N/A;   FINGER DEBRIDEMENT  1976   s/p infected dog bite   TEE WITHOUT CARDIOVERSION N/A 05/20/2012   Procedure: TRANSESOPHAGEAL ECHOCARDIOGRAM (TEE);  Surgeon: Fay Records, MD;  Location: [facility] ENDOSCOPY;  Service: Cardiovascular;  Laterality: N/A;    Home Medications:  Allergies as of 05/06/2021       Reactions   Bee Venom Anaphylaxis   Beef-derived Products    Personal preference   Lactalbumin    Milk-related Compounds Nausea And Vomiting   Intoloerance   Pegademase Bovine    Other reaction(s): Unknown Personal preference Personal preference Personal preference   Tilactase Nausea And Vomiting   Intoloerance        Medication List        Accurate as of May 06, 2021 11:53 AM. If you have any questions, ask your nurse or doctor.          STOP  taking these medications    hydrochlorothiazide 25 MG tablet Commonly known as: HYDRODIURIL Stopped by: Hollice Espy, MD   lisinopril-hydrochlorothiazide 20-12.5 MG tablet Commonly known as: ZESTORETIC Stopped by: Hollice Espy, MD   sildenafil 20 MG tablet Commonly known as: REVATIO Stopped by: Hollice Espy, MD       TAKE these medications    albuterol 108 (90 Base) MCG/ACT inhaler Commonly known as: VENTOLIN HFA Inhale into the lungs every 6 (six) hours as needed for wheezing or shortness of breath.   allopurinol 100 MG tablet Commonly known as: ZYLOPRIM Take 100 mg by mouth 2 (two) times daily.   amLODipine 5 MG  tablet Commonly known as: NORVASC   clobetasol cream 0.05 % Commonly known as: TEMOVATE Apply 1 application topically 2 (two) times daily.   colchicine 0.6 MG tablet Take 1 tablet (0.6 mg total) by mouth 2 (two) times daily.   cyanocobalamin 1000 MCG tablet Take 1,000 mcg by mouth daily.   doxepin 25 MG capsule Commonly known as: SINEQUAN Take 1 capsule (25 mg total) by mouth at bedtime.   DULoxetine 60 MG capsule Commonly known as: CYMBALTA Take 1 capsule (60 mg total) by mouth daily.   escitalopram 20 MG tablet Commonly known as: LEXAPRO Take 20 mg by mouth daily.   famotidine 20 MG tablet Commonly known as: PEPCID Take 20 mg by mouth 2 (two) times daily.   ferrous gluconate 324 MG tablet Commonly known as: FERGON Take 324 mg by mouth daily with breakfast.   fluticasone 50 MCG/ACT nasal spray Commonly known as: FLONASE Place 2 sprays into both nostrils daily.   gabapentin 300 MG capsule Commonly known as: NEURONTIN Take 1 capsule (300 mg total) by mouth 2 (two) times daily.   gabapentin 100 MG capsule Commonly known as: NEURONTIN   hydrocortisone cream 1 % Apply 1 application topically daily as needed for itching.   hydrOXYzine 25 MG tablet Commonly known as: ATARAX Take 25 mg by mouth 3 (three) times daily as needed. For itching   lansoprazole 15 MG capsule Commonly known as: PREVACID Take 15 mg by mouth daily as needed. For acid reflux   lisinopril 20 MG tablet Commonly known as: ZESTRIL Take 20 mg by mouth daily.   meloxicam 15 MG tablet Commonly known as: MOBIC Take 1 tablet (15 mg total) by mouth daily.   multivitamin tablet Take 1 tablet by mouth daily.   naproxen 500 MG tablet Commonly known as: Naprosyn Take 1 tablet (500 mg total) by mouth 2 (two) times daily with a meal.   oxyCODONE-acetaminophen 5-325 MG tablet Commonly known as: Roxicet Take 1-2 tablets by mouth every 4 (four) hours as needed (Pain).   pantoprazole 40 MG  tablet Commonly known as: Protonix Take 1 tablet (40 mg total) by mouth daily.   predniSONE 20 MG tablet Commonly known as: DELTASONE Take 20 mg by mouth daily with breakfast.   ranitidine 150 MG capsule Commonly known as: ZANTAC Take 150 mg by mouth 2 (two) times daily.   senna-docusate 8.6-50 MG tablet Commonly known as: Senokot-S Take 1 tablet by mouth 2 (two) times daily as needed for mild constipation.   sucralfate 1 g tablet Commonly known as: Carafate Take 1 tablet (1 g total) by mouth 4 (four) times daily.   tadalafil 20 MG tablet Commonly known as: CIALIS 1-5 tablets one hour prior to intercourse as needed. Started by: Hollice Espy, MD   thiamine 100 MG tablet Take 100 mg by mouth daily.  tiZANidine 4 MG capsule Commonly known as: ZANAFLEX Take 4 mg by mouth 3 (three) times daily.        Allergies:  Allergies  Allergen Reactions   Bee Venom Anaphylaxis   Beef-Derived Products     Personal preference   Lactalbumin    Milk-Related Compounds Nausea And Vomiting    Intoloerance   Pegademase Bovine     Other reaction(s): Unknown Personal preference Personal preference Personal preference    Tilactase Nausea And Vomiting    Intoloerance    Family History: No family history on file.  Social History:  reports that he has been smoking cigarettes. He has never used smokeless tobacco. He reports current alcohol use of about 9.0 standard drinks per week. He reports current drug use. Drugs: Marijuana and Cocaine.   Physical Exam: BP (!) 148/92    Pulse 65    Ht 5' 8.5" (1.74 m)    Wt 183 lb (83 kg)    BMI 27.42 kg/m   Constitutional:  Alert and oriented, No acute distress. HEENT: [place] AT, moist mucus membranes.  Trachea midline, no masses. Cardiovascular: No clubbing, cyanosis, or edema. Respiratory: Normal respiratory effort, no increased work of breathing. Skin: No rashes, bruises or suspicious lesions. Neurologic: Grossly intact, no focal deficits,  moving all 4 extremities. Psychiatric: Normal mood and affect.  Laboratory Data:  Lab Results  Component Value Date   CREATININE 1.76 (H) 11/30/2020   Lab Results  Component Value Date   HGBA1C 5.7 (H) 05/18/2012    Assessment & Plan:   1. Prostate cancer ([place]) - PSA is stable  - He was administered another Depo of leuprolide, 27-month injection -Will continue course 2 to 3 years in the setting of high risk prostate cancer - Reviewed bone and cardiovascular health prevention recommendations.   - PSA; future  - leuprolide (6 Month) (ELIGARD) injection 45 mg  2. Hot flashes  - Secondary to hormones for prostate cancer  - Discussed hot flashes and treatment options such as supplements as well as pharmaceuticals including SSRIs -We discussed some herbal options including plant estrogens, black cohosh, ginseng, dong quai, vit E -He was advised to try this for a month and if it fails to improve, will consider alternative therapies  2. Erectile dysfunction following radiation therapy - Had no improvement on sildenafil will switch his medication to generic Cialis (tadalafil).  - Tadalafil prescribed -If this is not effective, will we will have him return to discuss the possibility of injection therapy   F/u 6 months with PSA prior  Conley Rolls as a scribe for Hollice Espy, MD.,have documented all relevant documentation on the behalf of Hollice Espy, MD,as directed by  Hollice Espy, MD while in the presence of Hollice Espy, MD.  I have reviewed the above documentation for accuracy and completeness, and I agree with the above.   Hollice Espy, MD   [facility] [address], [place], [place] [address] [phone]

## 2021-05-06 ENCOUNTER — Other Ambulatory Visit: Payer: Self-pay

## 2021-05-06 ENCOUNTER — Ambulatory Visit (INDEPENDENT_AMBULATORY_CARE_PROVIDER_SITE_OTHER): Payer: Medicare Other | Admitting: Urology

## 2021-05-06 VITALS — BP 148/92 | HR 65 | Ht 68.5 in | Wt 183.0 lb

## 2021-05-06 DIAGNOSIS — C61 Malignant neoplasm of prostate: Secondary | ICD-10-CM | POA: Diagnosis not present

## 2021-05-06 MED ORDER — TADALAFIL 20 MG PO TABS: ORAL_TABLET | ORAL | 4 refills | Status: DC

## 2021-05-06 MED ORDER — LEUPROLIDE ACETATE (6 MONTH) 45 MG ~~LOC~~ KIT
45.0000 mg | PACK | Freq: Once | SUBCUTANEOUS | Status: AC
  Administered 2021-05-06: 45 mg via SUBCUTANEOUS

## 2021-05-06 NOTE — Patient Instructions (Addendum)
Dietary supplements People often assume that "natural" products cause no harm. However, all supplements may have potentially harmful side effects, and supplements can also interact with medications you're taking for other medical conditions. Always review what you're taking with your doctor.  Dietary supplements commonly considered for menopause symptoms include:  Plant estrogens. Asian women, who consume soy regularly, are less likely to report hot flashes and other menopausal symptoms than are women in other parts of the world. One reason might be related to the estrogen-like compounds in soy.  However, studies have generally found little or no benefit with plant estrogens, although research is ongoing to determine whether specific components of soy, such as genistein, help hot flashes.  Black cohosh. Black cohosh has been popular among many women with menopausal symptoms. Studies of black cohosh's effectiveness have had mixed results, and the supplement might be harmful to the liver in rare circumstances.  Ginseng. While ginseng may help with mood symptoms and insomnia, it doesn't appear to reduce hot flashes.  Dong quai. Study results indicate that dong quai isn't effective for hot flashes. The supplement can increase the effectiveness of blood-thinning medications, which can cause bleeding problems. Vitamin E. Taking a vitamin E supplement might offer some relief from mild hot flashes. In high doses, it can increase your risk of bleeding

## 2021-11-03 ENCOUNTER — Other Ambulatory Visit: Payer: Medicare Other

## 2021-11-03 DIAGNOSIS — C61 Malignant neoplasm of prostate: Secondary | ICD-10-CM

## 2021-11-04 LAB — PSA: Prostate Specific Ag, Serum: <0.1 ng/mL (ref 0.0–4.0)

## 2021-11-05 ENCOUNTER — Ambulatory Visit: Payer: Medicare Other | Admitting: Urology

## 2021-11-13 ENCOUNTER — Encounter: Payer: Self-pay | Admitting: Urology

## 2021-11-13 ENCOUNTER — Ambulatory Visit (INDEPENDENT_AMBULATORY_CARE_PROVIDER_SITE_OTHER): Payer: Medicare Other | Admitting: Urology

## 2021-11-13 VITALS — BP 136/85 | HR 77 | Ht 68.5 in | Wt 183.0 lb

## 2021-11-13 DIAGNOSIS — C61 Malignant neoplasm of prostate: Secondary | ICD-10-CM | POA: Diagnosis not present

## 2021-11-13 DIAGNOSIS — R232 Flushing: Secondary | ICD-10-CM

## 2021-11-13 DIAGNOSIS — N5235 Erectile dysfunction following radiation therapy: Secondary | ICD-10-CM | POA: Diagnosis not present

## 2021-11-13 MED ORDER — LEUPROLIDE ACETATE (6 MONTH) 45 MG ~~LOC~~ KIT
45.0000 mg | PACK | Freq: Once | SUBCUTANEOUS | Status: AC
  Administered 2021-11-13: 45 mg via SUBCUTANEOUS

## 2021-11-13 MED ORDER — TADALAFIL 20 MG PO TABS: ORAL_TABLET | ORAL | 4 refills | Status: DC

## 2021-11-13 NOTE — Patient Instructions (Signed)
Herbal options including plant estrogens, black cohosh, ginseng, dong quai, vit E  Continue Vitamin D and Calcium

## 2021-11-13 NOTE — Progress Notes (Signed)
Eligard SubQ Injection   Due to Prostate Cancer patient is present today for a Eligard Injection.  Medication: Eligard 6 month Dose: 45 mg  Location: right subcutaneous hip Lot: [id number] Exp: 03/11/23  Patient tolerated well, no complications were noted  Performed [id number] Dave Mannes, CMA   Continue on Vitamin D 800-1000iu and Calcium 1000-'1200mg'$  daily while on Androgen Deprivation Therapy.

## 2021-11-13 NOTE — Progress Notes (Signed)
11/13/21 9:06 AM   [name] 31-Mar-1961 [id number]  Referring provider:  [name], MD [place],  [place] [address] Chief Complaint  Patient presents with   Prostate Cancer     HPI: Edward Weber is a 61 y.o.male with a personal history of high risk prostate cancer and ED following radiation who presents today 6 month follow-up with PSA prior and Eligard.   Notably, his PSA rose to 13.3 to in 12/2019 at which time he underwent prostate biopsy indicating high risk prostate cancer.  He underwent a prostate biopsy on 02/07/2020. Pathology revealed high volume Gleason score (4+3, 3+4, 3+5) in 11/12 cores.   CT scan and bone scan were negative for metastatic disease.  He ultimately like to undergo IMRT with ADT.  He has final treatment of radiation in 05/2020.  His PSA remains undetectable on 11/03/2021.   He reports that his hot flashes are still very intense. He has no other urinary issues. He has improvement with erections on tadalafil.    PMH: Past Medical History:  Diagnosis Date   Alcoholism ([place])    Allergic rhinitis    Allergic state    Anemia    Arthritis    Cancer (HCC)    Chronic hepatitis C (HCC)    Depression    Esophagitis, reflux    GERD (gastroesophageal reflux disease)    Helicobacter pylori gastritis    Hepatitis    Hypertension    Neuromuscular disorder (HCC)    Osteoarthrosis    Peripheral neuropathy    Rhabdomyolysis    Sickle cell anemia (HCC)    Stroke (HCC)    Stroke [facility])    Vitiligo     Surgical History: Past Surgical History:  Procedure Laterality Date   COLON SURGERY     COLONOSCOPY WITH PROPOFOL N/A 05/09/2015   Procedure: COLONOSCOPY WITH PROPOFOL;  Surgeon: Hulen Luster, MD;  Location[facility] ENDOSCOPY;  Service: Gastroenterology;  Laterality: N/A;   ESOPHAGOGASTRODUODENOSCOPY N/A 05/09/2015   Procedure: ESOPHAGOGASTRODUODENOSCOPY (EGD);  Surgeon: Hulen Luster, MD;  Location: [facility] ENDOSCOPY;  Service:  Gastroenterology;  Laterality: N/A;   ESOPHAGOGASTRODUODENOSCOPY (EGD) WITH PROPOFOL N/A 05/04/2017   Procedure: ESOPHAGOGASTRODUODENOSCOPY (EGD) WITH PROPOFOL;  Surgeon: Toledo, Benay Pike, MD;  Location: [facility] ENDOSCOPY;  Service: Gastroenterology;  Laterality: N/A;   FINGER DEBRIDEMENT  1976   s/p infected dog bite   TEE WITHOUT CARDIOVERSION N/A 05/20/2012   Procedure: TRANSESOPHAGEAL ECHOCARDIOGRAM (TEE);  Surgeon: Fay Records, MD;  Location: [facility] ENDOSCOPY;  Service: Cardiovascular;  Laterality: N/A;    Home Medications:  Allergies as of 11/13/2021       Reactions   Bee Venom Anaphylaxis   Beef-derived Products    Personal preference   Lactalbumin    Milk-related Compounds Nausea And Vomiting   Intoloerance   Pegademase Bovine    Other reaction(s): Unknown Personal preference Personal preference Personal preference   Tilactase Nausea And Vomiting   Intoloerance        Medication List        Accurate as of November 13, 2021 11:59 PM. If you have any questions, ask your nurse or doctor.          albuterol 108 (90 Base) MCG/ACT inhaler Commonly known as: VENTOLIN HFA Inhale into the lungs every 6 (six) hours as needed for wheezing or shortness of breath.   allopurinol 100 MG tablet Commonly known as: ZYLOPRIM Take 100 mg by mouth 2 (two) times daily.  amLODipine 5 MG tablet Commonly known as: NORVASC   clobetasol cream 0.05 % Commonly known as: TEMOVATE Apply 1 application topically 2 (two) times daily.   colchicine 0.6 MG tablet Take 1 tablet (0.6 mg total) by mouth 2 (two) times daily.   cyanocobalamin 1000 MCG tablet Take 1,000 mcg by mouth daily.   doxepin 25 MG capsule Commonly known as: SINEQUAN Take 1 capsule (25 mg total) by mouth at bedtime.   DULoxetine 60 MG capsule Commonly known as: CYMBALTA Take 1 capsule (60 mg total) by mouth daily.   escitalopram 20 MG tablet Commonly known as: LEXAPRO Take 20 mg by mouth daily.   famotidine 20 MG  tablet Commonly known as: PEPCID Take 20 mg by mouth 2 (two) times daily.   ferrous gluconate 324 MG tablet Commonly known as: FERGON Take 324 mg by mouth daily with breakfast.   fluticasone 50 MCG/ACT nasal spray Commonly known as: FLONASE Place 2 sprays into both nostrils daily.   gabapentin 300 MG capsule Commonly known as: NEURONTIN Take 1 capsule (300 mg total) by mouth 2 (two) times daily.   gabapentin 100 MG capsule Commonly known as: NEURONTIN   hydrocortisone cream 1 % Apply 1 application topically daily as needed for itching.   hydrOXYzine 25 MG tablet Commonly known as: ATARAX Take 25 mg by mouth 3 (three) times daily as needed. For itching   lansoprazole 15 MG capsule Commonly known as: PREVACID Take 15 mg by mouth daily as needed. For acid reflux   lisinopril 20 MG tablet Commonly known as: ZESTRIL Take 20 mg by mouth daily.   lisinopril-hydrochlorothiazide 20-12.5 MG tablet Commonly known as: ZESTORETIC Take 2 tablets by mouth daily.   meloxicam 15 MG tablet Commonly known as: MOBIC Take 1 tablet (15 mg total) by mouth daily.   multivitamin tablet Take 1 tablet by mouth daily.   naproxen 500 MG tablet Commonly known as: Naprosyn Take 1 tablet (500 mg total) by mouth 2 (two) times daily with a meal.   oxyCODONE-acetaminophen 5-325 MG tablet Commonly known as: Roxicet Take 1-2 tablets by mouth every 4 (four) hours as needed (Pain).   pantoprazole 40 MG tablet Commonly known as: Protonix Take 1 tablet (40 mg total) by mouth daily.   predniSONE 20 MG tablet Commonly known as: DELTASONE Take 20 mg by mouth daily with breakfast.   ranitidine 150 MG capsule Commonly known as: ZANTAC Take 150 mg by mouth 2 (two) times daily.   senna-docusate 8.6-50 MG tablet Commonly known as: Senokot-S Take 1 tablet by mouth 2 (two) times daily as needed for mild constipation.   sucralfate 1 g tablet Commonly known as: Carafate Take 1 tablet (1 g total) by  mouth 4 (four) times daily.   tadalafil 20 MG tablet Commonly known as: CIALIS 1-5 tablets one hour prior to intercourse as needed.   thiamine 100 MG tablet Commonly known as: VITAMIN B1 Take 100 mg by mouth daily.   tiZANidine 4 MG capsule Commonly known as: ZANAFLEX Take 4 mg by mouth 3 (three) times daily.        Allergies:  Allergies  Allergen Reactions   Bee Venom Anaphylaxis   Beef-Derived Products     Personal preference   Lactalbumin    Milk-Related Compounds Nausea And Vomiting    Intoloerance   Pegademase Bovine     Other reaction(s): Unknown Personal preference Personal preference Personal preference    Tilactase Nausea And Vomiting    Intoloerance    Family  History: No family history on file.  Social History:  reports that he has been smoking cigarettes. He has never used smokeless tobacco. He reports current alcohol use of about 9.0 standard drinks of alcohol per week. He reports current drug use. Drugs: Marijuana and Cocaine.   Physical Exam: BP 136/85   Pulse 77   Ht 5' 8.5" (1.74 m)   Wt 183 lb (83 kg)   BMI 27.42 kg/m   Constitutional:  Alert and oriented, No acute distress. HEENT: [place] AT, moist mucus membranes.  Trachea midline, no masses. Cardiovascular: No clubbing, cyanosis, or edema. Respiratory: Normal respiratory effort, no increased work of breathing. Skin: No rashes, bruises or suspicious lesions. Neurologic: Grossly intact, no focal deficits, moving all 4 extremities. Psychiatric: Normal mood and affect.  Laboratory Data: Lab Results  Component Value Date   CREATININE 1.76 (H) 11/30/2020   Lab Results  Component Value Date   HGBA1C 5.7 (H) 05/18/2012    Assessment & Plan:   1. Prostate cancer ([place]) - PSA remains undetectable - He will have completed 2.5 years of Depo after this injection. In light of undetectable PSA and hot flashes this will be is last Depo injection  -leuprolide (6 Month) (ELIGARD) injection 45  mg  2. Hot flashes  - Secondary to hormones for prostate cancer  - Expect improvement off Eligard  -Never tried supplements, given a list of those again today to try  3. Erectile dysfunction following radiation therapy - Improvement on Tadalafil; continue this mediation. Refill sent   Follow-up 6 months with PSA  Conley Rolls as a scribe for Hollice Espy, MD.,have documented all relevant documentation on the behalf of Hollice Espy, MD,as directed by  Hollice Espy, MD while in the presence of Hollice Espy, MD.  I have reviewed the above documentation for accuracy and completeness, and I agree with the above.   Hollice Espy, MD  [facility] [address] [place], [place] [address] [phone]

## 2021-12-11 ENCOUNTER — Telehealth: Payer: Self-pay | Admitting: Internal Medicine

## 2021-12-11 ENCOUNTER — Telehealth: Payer: Self-pay | Admitting: Urology

## 2021-12-11 NOTE — Telephone Encounter (Signed)
Explained hot flashes should improve, informed patient of herbal supplements to take. Voiced understanding.

## 2021-12-11 NOTE — Telephone Encounter (Signed)
OPENED IN ERROR

## 2021-12-11 NOTE — Telephone Encounter (Signed)
Patient stopped by office to let us know he has been having hot flashes and patient thinks it is from the medication that he is on from here.   [phone] or [phone]

## 2022-01-17 ENCOUNTER — Emergency Department: Admission: EM | Admit: 2022-01-17 | Payer: Self-pay | Source: Home / Self Care

## 2022-01-17 ENCOUNTER — Observation Stay
Admission: EM | Admit: 2022-01-17 | Discharge: 2022-01-18 | Disposition: A | Discharge status: Home / Self Care | Payer: Medicare Other | Attending: Internal Medicine | Admitting: Internal Medicine

## 2022-01-17 ENCOUNTER — Encounter: Payer: Self-pay | Admitting: Emergency Medicine

## 2022-01-17 ENCOUNTER — Emergency Department: Payer: Medicare Other

## 2022-01-17 ENCOUNTER — Other Ambulatory Visit: Payer: Self-pay

## 2022-01-17 DIAGNOSIS — Z8673 Personal history of transient ischemic attack (TIA), and cerebral infarction without residual deficits: Secondary | ICD-10-CM | POA: Insufficient documentation

## 2022-01-17 DIAGNOSIS — R531 Weakness: Secondary | ICD-10-CM | POA: Insufficient documentation

## 2022-01-17 DIAGNOSIS — N1831 Chronic kidney disease, stage 3a: Secondary | ICD-10-CM | POA: Diagnosis not present

## 2022-01-17 DIAGNOSIS — I129 Hypertensive chronic kidney disease with stage 1 through stage 4 chronic kidney disease, or unspecified chronic kidney disease: Secondary | ICD-10-CM | POA: Diagnosis not present

## 2022-01-17 DIAGNOSIS — I10 Essential (primary) hypertension: Secondary | ICD-10-CM

## 2022-01-17 DIAGNOSIS — F1010 Alcohol abuse, uncomplicated: Secondary | ICD-10-CM | POA: Diagnosis present

## 2022-01-17 DIAGNOSIS — K219 Gastro-esophageal reflux disease without esophagitis: Secondary | ICD-10-CM

## 2022-01-17 DIAGNOSIS — M109 Gout, unspecified: Secondary | ICD-10-CM | POA: Diagnosis not present

## 2022-01-17 DIAGNOSIS — F17210 Nicotine dependence, cigarettes, uncomplicated: Secondary | ICD-10-CM | POA: Insufficient documentation

## 2022-01-17 DIAGNOSIS — R55 Syncope and collapse: Secondary | ICD-10-CM | POA: Diagnosis not present

## 2022-01-17 DIAGNOSIS — R0789 Other chest pain: Secondary | ICD-10-CM | POA: Insufficient documentation

## 2022-01-17 DIAGNOSIS — N183 Chronic kidney disease, stage 3 unspecified: Secondary | ICD-10-CM

## 2022-01-17 DIAGNOSIS — Z859 Personal history of malignant neoplasm, unspecified: Secondary | ICD-10-CM | POA: Insufficient documentation

## 2022-01-17 DIAGNOSIS — Z79899 Other long term (current) drug therapy: Secondary | ICD-10-CM | POA: Diagnosis not present

## 2022-01-17 DIAGNOSIS — G629 Polyneuropathy, unspecified: Secondary | ICD-10-CM

## 2022-01-17 DIAGNOSIS — Z20822 Contact with and (suspected) exposure to covid-19: Secondary | ICD-10-CM | POA: Diagnosis not present

## 2022-01-17 DIAGNOSIS — R079 Chest pain, unspecified: Secondary | ICD-10-CM

## 2022-01-17 LAB — CBC
HCT: 38.9 % — ABNORMAL LOW (ref 39.0–52.0)
Hemoglobin: 12.5 g/dL — ABNORMAL LOW (ref 13.0–17.0)
MCH: 31.1 pg (ref 26.0–34.0)
MCHC: 32.1 g/dL (ref 30.0–36.0)
MCV: 96.8 fL (ref 80.0–100.0)
Platelets: 312 10*3/uL (ref 150–400)
RBC: 4.02 MIL/uL — ABNORMAL LOW (ref 4.22–5.81)
RDW: 13.3 % (ref 11.5–15.5)
WBC: 11 10*3/uL — ABNORMAL HIGH (ref 4.0–10.5)
nRBC: 0 % (ref 0.0–0.2)

## 2022-01-17 LAB — BASIC METABOLIC PANEL
Anion gap: 12 (ref 5–15)
BUN: 30 mg/dL — ABNORMAL HIGH (ref 8–23)
CO2: 20 mmol/L — ABNORMAL LOW (ref 22–32)
Calcium: 9.4 mg/dL (ref 8.9–10.3)
Chloride: 105 mmol/L (ref 98–111)
Creatinine, Ser: 1.88 mg/dL — ABNORMAL HIGH (ref 0.61–1.24)
GFR, Estimated: 40 mL/min — ABNORMAL LOW (ref >60)
Glucose, Bld: 143 mg/dL — ABNORMAL HIGH (ref 70–99)
Potassium: 3.8 mmol/L (ref 3.5–5.1)
Sodium: 137 mmol/L (ref 135–145)

## 2022-01-17 LAB — TROPONIN I (HIGH SENSITIVITY)
Troponin I (High Sensitivity): 8 ng/L (ref <18)
Troponin I (High Sensitivity): 6 ng/L (ref <18)

## 2022-01-17 MED ORDER — SODIUM CHLORIDE 0.9 % IV BOLUS
1000.0000 mL | Freq: Once | INTRAVENOUS | Status: AC
  Administered 2022-01-17: 1000 mL via INTRAVENOUS

## 2022-01-17 NOTE — ED Provider Notes (Signed)
[facility] Provider Note    Event Date/Time   First MD Initiated Contact with Patient 01/17/22 1739     (approximate)   History   Near Syncope and Emesis   HPI  Edward Weber is a 61 y.o. male  who presents to the emergency department today because of concern for syncopal episode. The patient states that he has been having issues with syncopal episodes for months, although they do not happen frequently. Does have some slight chest discomfort when it happens. Happened twice today. He does not think that he hurt anything when he passed out. Has spoken to his doctor about this who thinks it might be due to medication. Otherwise the patient denies any recent sick symptoms.    Physical Exam   Triage Vital Signs: ED Triage Vitals  Enc Vitals Group     BP 01/17/22 1724 104/72     Pulse Rate 01/17/22 1724 80     Resp 01/17/22 1724 18     Temp 01/17/22 1724 97.6 F (36.4 C)     Temp src --      SpO2 01/17/22 1724 96 %     Weight 01/17/22 1725 180 lb (81.6 kg)     Height 01/17/22 1725 '5\' 8"'$  (1.727 m)     Head Circumference --      Peak Flow --      Pain Score 01/17/22 1725 0     Pain Loc --      Pain Edu? --      Excl. in [place]? --     Most recent vital signs: Vitals:   01/17/22 1724 01/17/22 1730  BP: 104/72 116/88  Pulse: 80 83  Resp: 18 18  Temp: 97.6 F (36.4 C)   SpO2: 96% 98%   General: Awake, alert, oriented. CV:  Good peripheral perfusion. Regular rate and rhythm. Resp:  Normal effort. Lungs clear. Abd:  No distention.     ED Results / Procedures / Treatments   Labs (all labs ordered are listed, but only abnormal results are displayed) Labs Reviewed  BASIC METABOLIC PANEL - Abnormal; Notable for the following components:      Result Value   CO2 20 (*)    Glucose, Bld 143 (*)    BUN 30 (*)    Creatinine, Ser 1.88 (*)    GFR, Estimated 40 (*)    All other components within normal limits  CBC - Abnormal; Notable for the  following components:   WBC 11.0 (*)    RBC 4.02 (*)    Hemoglobin 12.5 (*)    HCT 38.9 (*)    All other components within normal limits  RESP PANEL BY RT-PCR (FLU A&B, COVID) ARPGX2  URINALYSIS, ROUTINE W REFLEX MICROSCOPIC  URINALYSIS, COMPLETE (UACMP) WITH MICROSCOPIC  CBG MONITORING, ED  TROPONIN I (HIGH SENSITIVITY)  TROPONIN I (HIGH SENSITIVITY)     EKG  I, Nance Pear, attending physician, personally viewed and interpreted this EKG  EKG Time: 1721 Rate: 77 Rhythm: sinus rhythm Axis: left axis deviation Intervals: qtc 484 QRS: narrow, q waves v1 ST changes: no st elevation Impression: abnormal ekg  PROCEDURES:  Critical Care performed: No  Procedures   MEDICATIONS ORDERED IN ED: Medications - No data to display   IMPRESSION / MDM / [place] / ED COURSE  I reviewed the triage vital signs and the nursing notes.  Differential diagnosis includes, but is not limited to, anemia, dehydration, electrolyte abnormality, cardiac etiology  Patient's presentation is most consistent with acute presentation with potential threat to life or bodily function.  Patient presented to the emergency department today because of concerns for syncopal episode.  Patient does state that he has had similar symptoms over the past few months.  States he is going to his doctor.  On exam patient was afebrile.  Initial troponin was negative.  Blood work without concerning anemia or electrolyte abnormality.  Patient was given IV fluids.  Repeat troponin was again negative.  Patient stated that he felt better after IV fluids however upon attempted ambulation he was very weak and was unable to ambulate unassisted.  He states normally he ambulates without difficulty.  He states he feels weak in both of his legs.  We will add on further testing to evaluate for weakness.   FINAL CLINICAL IMPRESSION(S) / ED DIAGNOSES   Syncope Weakness    Note:  This  document was prepared using Dragon voice recognition software and may include unintentional dictation errors.    Nance Pear, MD 01/17/22 2248

## 2022-01-17 NOTE — ED Notes (Signed)
Pt unable to fully bear weight when standing and unable to take a step forward. EDP notified.

## 2022-01-17 NOTE — ED Triage Notes (Signed)
Pt to ED via [facility] from home for syncope x 2 and emesis. Per EMS pt had 2 witnessed syncopal episodes today, one by family and one by FD. Pt also has near syncope episode with them as well. Pt reports that he started feeling sick earlier today. Pt reports that he has vomited x 3. Pt has hx/o colon cancer, pt was taking once a month injection for treatment but states that he has not had that in about 5 months. Pt family reported to EMS that pt is paler than normal.  Pt given 500 cc NS in route as well as 4 mg of Zofran.

## 2022-01-17 NOTE — ED Notes (Signed)
Pt given phone per request

## 2022-01-18 ENCOUNTER — Observation Stay: Payer: Medicare Other

## 2022-01-18 ENCOUNTER — Observation Stay
Admit: 2022-01-18 | Discharge: 2022-01-18 | Disposition: A | Discharge status: Home / Self Care | Payer: Medicare Other | Attending: Family Medicine | Admitting: Family Medicine

## 2022-01-18 DIAGNOSIS — R55 Syncope and collapse: Principal | ICD-10-CM | POA: Diagnosis present

## 2022-01-18 DIAGNOSIS — R079 Chest pain, unspecified: Secondary | ICD-10-CM

## 2022-01-18 DIAGNOSIS — F1010 Alcohol abuse, uncomplicated: Secondary | ICD-10-CM | POA: Diagnosis not present

## 2022-01-18 DIAGNOSIS — G629 Polyneuropathy, unspecified: Secondary | ICD-10-CM

## 2022-01-18 DIAGNOSIS — K219 Gastro-esophageal reflux disease without esophagitis: Secondary | ICD-10-CM | POA: Diagnosis not present

## 2022-01-18 DIAGNOSIS — R296 Repeated falls: Secondary | ICD-10-CM | POA: Insufficient documentation

## 2022-01-18 DIAGNOSIS — N1831 Chronic kidney disease, stage 3a: Secondary | ICD-10-CM

## 2022-01-18 DIAGNOSIS — M109 Gout, unspecified: Secondary | ICD-10-CM

## 2022-01-18 DIAGNOSIS — I10 Essential (primary) hypertension: Secondary | ICD-10-CM | POA: Diagnosis not present

## 2022-01-18 DIAGNOSIS — N183 Chronic kidney disease, stage 3 unspecified: Secondary | ICD-10-CM

## 2022-01-18 LAB — URINALYSIS, ROUTINE W REFLEX MICROSCOPIC
Bilirubin Urine: NEGATIVE
Glucose, UA: NEGATIVE mg/dL
Hgb urine dipstick: NEGATIVE
Ketones, ur: NEGATIVE mg/dL
Leukocytes,Ua: NEGATIVE
Nitrite: NEGATIVE
Protein, ur: NEGATIVE mg/dL
Specific Gravity, Urine: 1.012 (ref 1.005–1.030)
pH: 5 (ref 5.0–8.0)

## 2022-01-18 LAB — RESP PANEL BY RT-PCR (FLU A&B, COVID) ARPGX2
Influenza A by PCR: NEGATIVE
Influenza B by PCR: NEGATIVE
SARS Coronavirus 2 by RT PCR: NEGATIVE

## 2022-01-18 LAB — BASIC METABOLIC PANEL
Anion gap: 8 (ref 5–15)
BUN: 28 mg/dL — ABNORMAL HIGH (ref 8–23)
CO2: 22 mmol/L (ref 22–32)
Calcium: 9.3 mg/dL (ref 8.9–10.3)
Chloride: 108 mmol/L (ref 98–111)
Creatinine, Ser: 1.56 mg/dL — ABNORMAL HIGH (ref 0.61–1.24)
GFR, Estimated: 50 mL/min — ABNORMAL LOW (ref >60)
Glucose, Bld: 105 mg/dL — ABNORMAL HIGH (ref 70–99)
Potassium: 4.1 mmol/L (ref 3.5–5.1)
Sodium: 138 mmol/L (ref 135–145)

## 2022-01-18 LAB — ECHOCARDIOGRAM COMPLETE
AR max vel: 3.63 cm2
AV Peak grad: 4.3 mmHg
Ao pk vel: 1.04 m/s
Area-P 1/2: 2.19 cm2
Calc EF: 59.1 %
Height: 68 in
P 1/2 time: 595 msec
S' Lateral: 2.7 cm
Single Plane A2C EF: 58.2 %
Single Plane A4C EF: 60.9 %
Weight: 2880 oz

## 2022-01-18 LAB — HIV ANTIBODY (ROUTINE TESTING W REFLEX): HIV Screen 4th Generation wRfx: NONREACTIVE

## 2022-01-18 LAB — CBC
HCT: 36.8 % — ABNORMAL LOW (ref 39.0–52.0)
Hemoglobin: 12.1 g/dL — ABNORMAL LOW (ref 13.0–17.0)
MCH: 31.6 pg (ref 26.0–34.0)
MCHC: 32.9 g/dL (ref 30.0–36.0)
MCV: 96.1 fL (ref 80.0–100.0)
Platelets: 259 10*3/uL (ref 150–400)
RBC: 3.83 MIL/uL — ABNORMAL LOW (ref 4.22–5.81)
RDW: 13.3 % (ref 11.5–15.5)
WBC: 9.4 10*3/uL (ref 4.0–10.5)
nRBC: 0 % (ref 0.0–0.2)

## 2022-01-18 LAB — TROPONIN I (HIGH SENSITIVITY): Troponin I (High Sensitivity): 11 ng/L (ref <18)

## 2022-01-18 MED ORDER — ALLOPURINOL 100 MG PO TABS
100.0000 mg | ORAL_TABLET | Freq: Two times a day (BID) | ORAL | Status: DC
  Administered 2022-01-18: 100 mg via ORAL
  Filled 2022-01-18: qty 1

## 2022-01-18 MED ORDER — BUTALBITAL-APAP-CAFFEINE 50-325-40 MG PO TABS
2.0000 | ORAL_TABLET | Freq: Once | ORAL | Status: AC
  Administered 2022-01-18: 2 via ORAL
  Filled 2022-01-18: qty 2

## 2022-01-18 MED ORDER — MORPHINE SULFATE (PF) 2 MG/ML IV SOLN: 2.0000 mg | INTRAVENOUS | Status: DC | PRN

## 2022-01-18 MED ORDER — DULOXETINE HCL 60 MG PO CPEP: 60.0000 mg | ORAL_CAPSULE | Freq: Every day | ORAL | Status: DC

## 2022-01-18 MED ORDER — GABAPENTIN 600 MG PO TABS: 300.0000 mg | ORAL_TABLET | Freq: Two times a day (BID) | ORAL | Status: DC

## 2022-01-18 MED ORDER — PANTOPRAZOLE SODIUM 40 MG PO TBEC
40.0000 mg | DELAYED_RELEASE_TABLET | Freq: Every day | ORAL | Status: DC
  Administered 2022-01-18: 40 mg via ORAL
  Filled 2022-01-18: qty 1

## 2022-01-18 MED ORDER — NITROGLYCERIN 0.4 MG SL SUBL: 0.4000 mg | SUBLINGUAL_TABLET | SUBLINGUAL | Status: DC | PRN

## 2022-01-18 MED ORDER — TIZANIDINE HCL 2 MG PO TABS: 4.0000 mg | ORAL_TABLET | Freq: Three times a day (TID) | ORAL | Status: DC | PRN

## 2022-01-18 MED ORDER — ASPIRIN 325 MG PO TBEC
325.0000 mg | DELAYED_RELEASE_TABLET | Freq: Every day | ORAL | Status: DC
  Administered 2022-01-18: 325 mg via ORAL
  Filled 2022-01-18: qty 1

## 2022-01-18 MED ORDER — COLCHICINE 0.6 MG PO TABS
0.6000 mg | ORAL_TABLET | Freq: Two times a day (BID) | ORAL | Status: DC
  Administered 2022-01-18: 0.6 mg via ORAL
  Filled 2022-01-18: qty 1

## 2022-01-18 MED ORDER — PROCHLORPERAZINE EDISYLATE 10 MG/2ML IJ SOLN
10.0000 mg | Freq: Once | INTRAMUSCULAR | Status: AC
  Administered 2022-01-18: 10 mg via INTRAVENOUS
  Filled 2022-01-18: qty 2

## 2022-01-18 MED ORDER — LACTATED RINGERS IV BOLUS
1000.0000 mL | Freq: Once | INTRAVENOUS | Status: AC
  Administered 2022-01-18: 1000 mL via INTRAVENOUS

## 2022-01-18 MED ORDER — SUCRALFATE 1 G PO TABS: 1.0000 g | ORAL_TABLET | Freq: Four times a day (QID) | ORAL | Status: DC

## 2022-01-18 MED ORDER — ESCITALOPRAM OXALATE 10 MG PO TABS
20.0000 mg | ORAL_TABLET | Freq: Every day | ORAL | Status: DC
  Administered 2022-01-18: 20 mg via ORAL
  Filled 2022-01-18: qty 2

## 2022-01-18 MED ORDER — SODIUM CHLORIDE 0.9 % IV SOLN: INTRAVENOUS | Status: DC

## 2022-01-18 MED ORDER — ENOXAPARIN SODIUM 40 MG/0.4ML IJ SOSY
40.0000 mg | PREFILLED_SYRINGE | INTRAMUSCULAR | Status: DC
  Administered 2022-01-18: 40 mg via SUBCUTANEOUS
  Filled 2022-01-18: qty 0.4

## 2022-01-18 MED ORDER — SODIUM CHLORIDE 0.9% FLUSH
3.0000 mL | Freq: Two times a day (BID) | INTRAVENOUS | Status: DC
  Administered 2022-01-18: 3 mL via INTRAVENOUS

## 2022-01-18 NOTE — Assessment & Plan Note (Addendum)
.-  He will be placed on as needed sublingual nitroglycerin and IV morphine sulfate for pain. - We will place him on aspirin as well as statin therapy. - Cardiology consult will be obtained. - We will follow serial troponins. - I notified Dr. Saralyn Pilar about the patient.

## 2022-01-18 NOTE — Assessment & Plan Note (Addendum)
-   The patient will be hydrated with IV normal saline and will follow his BMP. -His renal functions are comparable to previous levels last year.

## 2022-01-18 NOTE — Assessment & Plan Note (Signed)
-   We will continue amlodipine and Zestril.

## 2022-01-18 NOTE — Assessment & Plan Note (Signed)
-   We will continue Neurontin. ?

## 2022-01-18 NOTE — Assessment & Plan Note (Addendum)
-   He stated he randomly drinks but his recurrent falls could be related to alcoholism as well. - He will be monitored for alcohol withdrawal while he is here. - We will place on as needed IV Ativan.

## 2022-01-18 NOTE — Progress Notes (Signed)
*  PRELIMINARY RESULTS* Echocardiogram 2D Echocardiogram has been performed.  Edward Weber 01/18/2022, 11:07 AM

## 2022-01-18 NOTE — Consult Note (Signed)
[facility] Cardiology  CARDIOLOGY CONSULT NOTE  Patient ID: Edward Weber MRN: [id number] DOB/AGE: 1960-09-13 61 y.o.  Admit date: 01/17/2022 Referring Physician Jimmye Norman Primary Physician [facility] Primary Cardiologist  Reason for Consultation syncope  HPI: 61 year old gentleman referred for evaluation of syncope.  Patient has a history of EtOH abuse, hypertension, depression, and prior CVA.  She presented to [facility] ED on 01/17/2022, following a brief episode of near loss of consciousness.  He reports he continues to drink 1 or 2 beers a couple days a week.  On presentation, patient reported 7 out of 10 substernal chest pain.  ECG revealed sinus rhythm with incomplete right bundle branch block.  High-sensitivity troponin was normal (6, 11).  The patient currently denies chest pain.  2D echocardiogram reveals LVEF 60-65%.  Review of systems complete and found to be negative unless listed above     Past Medical History:  Diagnosis Date   Alcoholism ([place])    Allergic rhinitis    Allergic state    Anemia    Arthritis    Cancer (HCC)    Chronic hepatitis C (HCC)    Depression    Esophagitis, reflux    GERD (gastroesophageal reflux disease)    Helicobacter pylori gastritis    Hepatitis    Hypertension    Neuromuscular disorder (HCC)    Osteoarthrosis    Peripheral neuropathy    Rhabdomyolysis    Sickle cell anemia (HCC)    Stroke (HCC)    Stroke ([place])    Vitiligo     Past Surgical History:  Procedure Laterality Date   COLON SURGERY     COLONOSCOPY WITH PROPOFOL N/A 05/09/2015   Procedure: COLONOSCOPY WITH PROPOFOL;  Surgeon: Hulen Luster, MD;  Location: [facility] ENDOSCOPY;  Service: Gastroenterology;  Laterality: N/A;   ESOPHAGOGASTRODUODENOSCOPY N/A 05/09/2015   Procedure: ESOPHAGOGASTRODUODENOSCOPY (EGD);  Surgeon: Hulen Luster, MD;  Location: [facility] ENDOSCOPY;  Service: Gastroenterology;  Laterality: N/A;   ESOPHAGOGASTRODUODENOSCOPY (EGD) WITH PROPOFOL N/A 05/04/2017   Procedure:  ESOPHAGOGASTRODUODENOSCOPY (EGD) WITH PROPOFOL;  Surgeon: Toledo, Benay Pike, MD;  Location: [facility] ENDOSCOPY;  Service: Gastroenterology;  Laterality: N/A;   FINGER DEBRIDEMENT  1976   s/p infected dog bite   TEE WITHOUT CARDIOVERSION N/A 05/20/2012   Procedure: TRANSESOPHAGEAL ECHOCARDIOGRAM (TEE);  Surgeon: Fay Records, MD;  Location: [facility] ENDOSCOPY;  Service: Cardiovascular;  Laterality: N/A;    (Not in a hospital admission)  Social History   Socioeconomic History   Marital status: Single    Spouse name: Not on file   Number of children: Not on file   Years of education: Not on file   Highest education level: Not on file  Occupational History   Not on file  Tobacco Use   Smoking status: Every Day    Types: Cigarettes   Smokeless tobacco: Never  Substance and Sexual Activity   Alcohol use: Yes    Alcohol/week: 9.0 standard drinks of alcohol    Types: 6 Cans of beer, 3 Shots of liquor per week    Comment: only on the weekends   Drug use: Yes    Types: Marijuana, Cocaine   Sexual activity: Not on file  Other Topics Concern   Not on file  Social History Narrative   Not on file   Social Determinants of Health   Financial Resource Strain: Not on file  Food Insecurity: Not on file  Transportation Needs: Not on file  Physical Activity: Not on file  Stress: Not on file  Social  Connections: Not on file  Intimate Partner Violence: Not on file    No family history on file.    Review of systems complete and found to be negative unless listed above      PHYSICAL EXAM  General: Well developed, well nourished, in no acute distress HEENT:  Normocephalic and atramatic Neck:  No JVD.  Lungs: Clear bilaterally to auscultation and percussion. Heart: HRRR . Normal S1 and S2 without gallops or murmurs.  Abdomen: Bowel sounds are positive, abdomen soft and non-tender  Msk:  Back normal, normal gait. Normal strength and tone for age. Extremities: No clubbing, cyanosis or edema.    Neuro: Alert and oriented X 3. Psych:  Good affect, responds appropriately  Labs:   Lab Results  Component Value Date   WBC 9.4 01/18/2022   HGB 12.1 (L) 01/18/2022   HCT 36.8 (L) 01/18/2022   MCV 96.1 01/18/2022   PLT 259 01/18/2022    Recent Labs  Lab 01/18/22 0425  NA 138  K 4.1  CL 108  CO2 22  BUN 28*  CREATININE 1.56*  CALCIUM 9.3  GLUCOSE 105*   Lab Results  Component Value Date   CKTOTAL 314 03/10/2018   TROPONINI <0.03 03/14/2018    Lab Results  Component Value Date   CHOL 178 05/18/2012   Lab Results  Component Value Date   HDL 20 (L) 05/18/2012   Lab Results  Component Value Date   LDLCALC 87 05/18/2012   Lab Results  Component Value Date   TRIG 354 (H) 05/18/2012   Lab Results  Component Value Date   CHOLHDL 8.9 05/18/2012   No results found for: "LDLDIRECT"    Radiology: ECHOCARDIOGRAM COMPLETE  Result Date: 01/18/2022    ECHOCARDIOGRAM REPORT   Patient Name:   Edward Weber Date of Exam: 01/18/2022 Medical Rec #:  [id number]        Height:       68.0 in Accession #:    [id number]       Weight:       180.0 lb Date of Birth:  02-Aug-1960        BSA:          1.954 m Patient Age:    19 years         BP:           146/93 mmHg Patient Gender: M                HR:           53 bpm. Exam Location:  [facility] Procedure: 2D Echo Indications:     Syncope R55  History:         Patient has no prior history of Echocardiogram examinations.  Sonographer:     Kathlen Brunswick RDCS Referring Phys:  [id number] [place] Diagnosing Phys: [place]  1. Left ventricular ejection fraction, by estimation, is 60 to 65%. The left ventricle has normal function. The left ventricle has no regional wall motion abnormalities. There is moderate concentric left ventricular hypertrophy. Left ventricular diastolic parameters are consistent with Grade I diastolic dysfunction (impaired relaxation).  2. Right ventricular systolic function is normal. The right ventricular  size is normal.  3. Left atrial size was mildly dilated.  4. The mitral valve is normal in structure. Trivial mitral valve regurgitation. No evidence of mitral stenosis.  5. The aortic valve is normal in structure. Aortic valve regurgitation is mild. No aortic stenosis is present.  6. The inferior vena cava is normal in size with greater than 50% respiratory variability, suggesting right atrial pressure of 3 mmHg. FINDINGS  Left Ventricle: Left ventricular ejection fraction, by estimation, is 60 to 65%. The left ventricle has normal function. The left ventricle has no regional wall motion abnormalities. The left ventricular internal cavity size was normal in size. There is  moderate concentric left ventricular hypertrophy. Left ventricular diastolic parameters are consistent with Grade I diastolic dysfunction (impaired relaxation). Right Ventricle: The right ventricular size is normal. No increase in right ventricular wall thickness. Right ventricular systolic function is normal. Left Atrium: Left atrial size was mildly dilated. Right Atrium: Right atrial size was normal in size. Pericardium: There is no evidence of pericardial effusion. Mitral Valve: The mitral valve is normal in structure. Trivial mitral valve regurgitation. No evidence of mitral valve stenosis. Tricuspid Valve: The tricuspid valve is normal in structure. Tricuspid valve regurgitation is trivial. No evidence of tricuspid stenosis. Aortic Valve: The aortic valve is normal in structure. Aortic valve regurgitation is mild. Aortic regurgitation PHT measures 595 msec. No aortic stenosis is present. Aortic valve peak gradient measures 4.3 mmHg. Pulmonic Valve: The pulmonic valve was normal in structure. Pulmonic valve regurgitation is not visualized. No evidence of pulmonic stenosis. Aorta: The aortic root is normal in size and structure. Venous: The inferior vena cava is normal in size with greater than 50% respiratory variability, suggesting right  atrial pressure of 3 mmHg. IAS/Shunts: No atrial level shunt detected by color flow Doppler.  LEFT VENTRICLE PLAX 2D LVIDd:         4.30 cm     Diastology LVIDs:         2.70 cm     LV e' medial:    8.00 cm/s LV PW:         1.10 cm     LV E/e' medial:  5.5 LV IVS:        1.30 cm     LV e' lateral:   10.20 cm/s LVOT diam:     2.50 cm     LV E/e' lateral: 4.3 LV SV:         91 LV SV Index:   47 LVOT Area:     4.91 cm  LV Volumes (MOD) LV vol d, MOD A2C: 36.4 ml LV vol d, MOD A4C: 72.7 ml LV vol s, MOD A2C: 15.2 ml LV vol s, MOD A4C: 28.4 ml LV SV MOD A2C:     21.2 ml LV SV MOD A4C:     72.7 ml LV SV MOD BP:      31.5 ml RIGHT VENTRICLE RV Basal diam:  3.00 cm RV S prime:     16.20 cm/s TAPSE (M-mode): 2.0 cm LEFT ATRIUM           Index       RIGHT ATRIUM           Index LA diam:      3.30 cm 1.69 cm/m  RA Area:     12.10 cm LA Vol (A2C): 19.5 ml 9.98 ml/m  RA Volume:   24.10 ml  12.33 ml/m LA Vol (A4C): 18.3 ml 9.36 ml/m  AORTIC VALVE                 PULMONIC VALVE AV Area (Vmax): 3.63 cm     PV Vmax:          0.96 m/s AV Vmax:  104.00 cm/s  PV Peak grad:     3.7 mmHg AV Peak Grad:   4.3 mmHg     PR End Diast Vel: 3.70 msec LVOT Vmax:      77.00 cm/s LVOT Vmean:     48.700 cm/s LVOT VTI:       0.186 m AI PHT:         595 msec  AORTA Ao Root diam: 3.70 cm Ao Asc diam:  3.50 cm MITRAL VALVE MV Area (PHT): 2.19 cm    SHUNTS MV Decel Time: 346 msec    Systemic VTI:  0.19 m MV E velocity: 44.00 cm/s  Systemic Diam: 2.50 cm MV A velocity: 58.95 cm/s MV E/A ratio:  0.75 Shaukat Khan Electronically signed by Neoma Laming Signature Date/Time: 01/18/2022/11:37:28 AM    Final    DG Chest Portable 1 View  Result Date: 01/17/2022 CLINICAL DATA:  Syncopal episodes and weakness. EXAM: PORTABLE CHEST 1 VIEW COMPARISON:  PA Lat 03/14/2018. FINDINGS: The heart size is normal and mediastinal contours are stable with mild aortic ectasia and tortuosity, mild aortic atherosclerosis. Both lungs are clear. The visualized  skeletal structures are unremarkable. IMPRESSION: No active disease.  Stable chest. Electronically Signed   By: Telford Nab M.D.   On: 01/17/2022 23:35   CT Head Wo Contrast  Result Date: 01/17/2022 CLINICAL DATA:  Syncope and lower extremity weakness. EXAM: CT HEAD WITHOUT CONTRAST TECHNIQUE: Contiguous axial images were obtained from the base of the skull through the vertex without intravenous contrast. RADIATION DOSE REDUCTION: This exam was performed according to the departmental dose-optimization program which includes automated exposure control, adjustment of the mA and/or kV according to patient size and/or use of iterative reconstruction technique. COMPARISON:  March 10, 2018 FINDINGS: Brain: There is mild cerebral atrophy with widening of the extra-axial spaces and ventricular dilatation. There are areas of decreased attenuation within the white matter tracts of the supratentorial brain, consistent with microvascular disease changes. Small, chronic bilateral basal ganglia lacunar infarcts are noted. Vascular: No hyperdense vessel or unexpected calcification. Skull: A right frontoparietal craniotomy defect is seen. Sinuses/Orbits: A chronic deformity is seen involving the medial wall of the left orbit. Other: None. IMPRESSION: 1. No acute intracranial abnormality. 2. Generalized cerebral atrophy and microvascular disease changes of the supratentorial brain. 3. Small, chronic bilateral basal ganglia lacunar infarcts. 4. Right frontoparietal craniotomy defect. 5. Chronic deformity involving the medial wall of the left orbit. Electronically Signed   By: Virgina Norfolk M.D.   On: 01/17/2022 23:02    EKG: Sinus rhythm with incomplete right bundle branch block  ASSESSMENT AND PLAN:   1.  Syncope/near syncope, atypical, no obvious etiology, ECG unremarkable, no evidence for arrhythmia, normal left ventricular function on echocardiogram, Doppler results pending 2.  Chest pain, atypical, normal  high-sensitivity troponin, nondiagnostic ECG 3.  Alcohol abuse 4.  Essential hypertension 5.  Chronic kidney disease stage III  Recommendations  1.  Agree with current therapy 2.  Await carotid Doppler results 3.  Consider discharge home pending carotid Doppler results 4.  Follow-up with me 1 to 2 weeks for outpatient Holter monitor  Signed: Isaias Cowman MD,PhD, [facility] 01/18/2022, 11:44 AM

## 2022-01-18 NOTE — Assessment & Plan Note (Addendum)
-   We will continue PPI therapy as well as Carafate.

## 2022-01-18 NOTE — Assessment & Plan Note (Addendum)
-   We will continue allopurinol and colchicine. 

## 2022-01-18 NOTE — Discharge Summary (Signed)
Physician Discharge Summary  Edward Weber JJO:[id number] DOB: Jan 06, 1961 DOA: 01/17/2022  PCP: [name], MD  Admit date: 01/17/2022 Discharge date: 01/18/2022  Admitted From: home  Disposition:  home   Recommendations for Outpatient Follow-up:  Follow up with PCP in 1-2 weeks F/u w/ cardio, Dr. Saralyn Pilar, in 1-2 weeks for [place]: no  Equipment/Devices:   Discharge Condition: stable  CODE STATUS: full  Diet recommendation: Heart Healthy  Brief/Interim Summary: HPI was taken from Dr. Sidney Ace: 61 year old gentleman referred for evaluation of syncope.  Patient has a history of EtOH abuse, hypertension, depression, and prior CVA.  She presented to [facility] ED on 01/17/2022, following a brief episode of near loss of consciousness.  He reports he continues to drink 1 or 2 beers a couple days a week.  On presentation, patient reported 7 out of 10 substernal chest pain.  ECG revealed sinus rhythm with incomplete right bundle branch block.  High-sensitivity troponin was normal (6, 11).  The patient currently denies chest pain.  2D echocardiogram reveals LVEF 60-65%.  As per Dr. Jimmye Norman 01/18/22: Pt presented after a syncopal episode. Work-up was unremarkable. B/l carotid US showed no significant stenosis seen in the carotid arteries b/l. Echo showed EF 16-01%, grade I diastolic dysfunction, no regional wall motion abnormalities & no atrial level shunt was detected. Troponins were neg x 3. Pt will need to f/u outpatient w/ cardio, Dr. Saralyn Pilar, in 1-2 weeks for Holter monitor.    Discharge Diagnoses:  Principal Problem:   Recurrent syncope Active Problems:   Chest pain   Alcohol abuse   Essential hypertension   GERD without esophagitis   Stage III chronic kidney disease ([place])   Gout   Peripheral neuropathy  Recurrent syncope: etiology unclear. Continue on tele. Echo shows EF 09-32%, grade I diastolic dysfunction & no regional wall motion abnormalities . B/l carotid US  showed no significant stenosis.    Chest pain: troponins neg x 3. Cardiac etiology r/o. No chest pain currently.    Alcohol abuse: alcohol cessation counseling. Monitor for alcohol w/drawl symptoms    HTN: holding home dose of amlodipine, lisinopril, HCTZ   GERD: continue on PPI, carafate    Peripheral neuropathy: continue on home dose of gabapentin    Gout: continue on home dose of allopurinol, colchicine    CKDIIIa: Cr is trending down from day prior. Avoid nephrotoxic meds. No NSAIDs  Depression: severity unknown. Continue on home dose of duloxetine, escitalopram   Discharge Instructions  Discharge Instructions     Diet - low sodium heart healthy   Complete by: As directed    Discharge instructions   Complete by: As directed    F/u w/ PCP in 1-2 weeks. F/u w/ cardio, Dr. Saralyn Pilar, in 1-2 weeks for Holter/heart monitor   Increase activity slowly   Complete by: As directed       Allergies as of 01/18/2022       Reactions   Bee Venom Anaphylaxis   Beef-derived Products    Personal preference   Lactalbumin    Milk-related Compounds Nausea And Vomiting   Intoloerance   Pegademase Bovine    Other reaction(s): Unknown Personal preference Personal preference Personal preference   Tilactase Nausea And Vomiting   Intoloerance        Medication List     STOP taking these medications    lisinopril 20 MG tablet Commonly known as: ZESTRIL   meloxicam 15 MG tablet Commonly known as: MOBIC  naproxen 500 MG tablet Commonly known as: Naprosyn   pantoprazole 40 MG tablet Commonly known as: Protonix       TAKE these medications    albuterol 108 (90 Base) MCG/ACT inhaler Commonly known as: VENTOLIN HFA Inhale into the lungs every 6 (six) hours as needed for wheezing or shortness of breath.   allopurinol 100 MG tablet Commonly known as: ZYLOPRIM Take 100 mg by mouth 2 (two) times daily.   amLODipine 5 MG tablet Commonly known as: NORVASC   clobetasol  cream 0.05 % Commonly known as: TEMOVATE Apply 1 application topically 2 (two) times daily.   colchicine 0.6 MG tablet Take 1 tablet (0.6 mg total) by mouth 2 (two) times daily.   cyanocobalamin 1000 MCG tablet Take 1,000 mcg by mouth daily.   doxepin 25 MG capsule Commonly known as: SINEQUAN Take 1 capsule (25 mg total) by mouth at bedtime.   DULoxetine 60 MG capsule Commonly known as: CYMBALTA Take 1 capsule (60 mg total) by mouth daily.   escitalopram 20 MG tablet Commonly known as: LEXAPRO Take 20 mg by mouth daily.   famotidine 20 MG tablet Commonly known as: PEPCID Take 20 mg by mouth 2 (two) times daily.   ferrous gluconate 324 MG tablet Commonly known as: FERGON Take 324 mg by mouth daily with breakfast.   fluticasone 50 MCG/ACT nasal spray Commonly known as: FLONASE Place 2 sprays into both nostrils daily.   gabapentin 300 MG capsule Commonly known as: NEURONTIN Take 1 capsule (300 mg total) by mouth 2 (two) times daily.   gabapentin 100 MG capsule Commonly known as: NEURONTIN   hydrocortisone cream 1 % Apply 1 application topically daily as needed for itching.   hydrOXYzine 25 MG tablet Commonly known as: ATARAX Take 25 mg by mouth 3 (three) times daily as needed. For itching   lansoprazole 15 MG capsule Commonly known as: PREVACID Take 15 mg by mouth daily as needed. For acid reflux   lisinopril-hydrochlorothiazide 20-12.5 MG tablet Commonly known as: ZESTORETIC Take 2 tablets by mouth daily.   multivitamin tablet Take 1 tablet by mouth daily.   oxyCODONE-acetaminophen 5-325 MG tablet Commonly known as: Roxicet Take 1-2 tablets by mouth every 4 (four) hours as needed (Pain).   predniSONE 20 MG tablet Commonly known as: DELTASONE Take 20 mg by mouth daily with breakfast.   ranitidine 150 MG capsule Commonly known as: ZANTAC Take 150 mg by mouth 2 (two) times daily.   senna-docusate 8.6-50 MG tablet Commonly known as: Senokot-S Take 1  tablet by mouth 2 (two) times daily as needed for mild constipation.   sucralfate 1 g tablet Commonly known as: Carafate Take 1 tablet (1 g total) by mouth 4 (four) times daily.   tadalafil 20 MG tablet Commonly known as: CIALIS 1-5 tablets one hour prior to intercourse as needed.   thiamine 100 MG tablet Commonly known as: VITAMIN B1 Take 100 mg by mouth daily.   tiZANidine 4 MG capsule Commonly known as: ZANAFLEX Take 4 mg by mouth 3 (three) times daily.        Follow-up Information     Paraschos, Sheppard Coil, MD Follow up.   Specialty: Cardiology Why: F/u in 1-2 weeks for Holter monitor Contact information: [facility] [place] [address] [phone]                Allergies  Allergen Reactions   Bee Venom Anaphylaxis   Beef-Derived Products     Personal  preference   Lactalbumin    Milk-Related Compounds Nausea And Vomiting    Intoloerance   Pegademase Bovine     Other reaction(s): Unknown Personal preference Personal preference Personal preference    Tilactase Nausea And Vomiting    Intoloerance    Consultations: Cardio: Dr. Saralyn Pilar    Procedures/Studies: US Carotid Bilateral  Result Date: 01/18/2022 CLINICAL DATA:  CVA, syncope, hypertension EXAM: BILATERAL CAROTID DUPLEX ULTRASOUND TECHNIQUE: Pearline Cables scale imaging, color Doppler and duplex ultrasound were performed of bilateral carotid and vertebral arteries in the neck. COMPARISON:  None Available. FINDINGS: Criteria: Quantification of carotid stenosis is based on velocity parameters that correlate the residual internal carotid diameter with NASCET-based stenosis levels, using the diameter of the distal internal carotid lumen as the denominator for stenosis measurement. The following velocity measurements were obtained: RIGHT ICA: 58 cm/sec CCA: 44 cm/sec SYSTOLIC ICA/CCA RATIO:  1.3 ECA: 91 cm/sec LEFT ICA: 91 cm/sec CCA: 51 cm/sec SYSTOLIC ICA/CCA RATIO:   1.8 ECA: 76 cm/sec RIGHT CAROTID ARTERY: Small plaques are seen RIGHT VERTEBRAL ARTERY:  Patent with antegrade flow LEFT CAROTID ARTERY:  Small plaques are seen LEFT VERTEBRAL ARTERY:  Patent with antegrade flow IMPRESSION: No significant stenosis is seen in carotid arteries on both sides. Electronically Signed   By: Elmer Picker M.D.   On: 01/18/2022 11:50   ECHOCARDIOGRAM COMPLETE  Result Date: 01/18/2022    ECHOCARDIOGRAM REPORT   Patient Name:   Edward Weber Date of Exam: 01/18/2022 Medical Rec #:  [id number]        Height:       68.0 in Accession #:    [id number]       Weight:       180.0 lb Date of Birth:  May 09, 1960        BSA:          1.954 m Patient Age:    61 years         BP:           146/93 mmHg Patient Gender: M                HR:           53 bpm. Exam Location:  [facility] Procedure: 2D Echo Indications:     Syncope R55  History:         Patient has no prior history of Echocardiogram examinations.  Sonographer:     Kathlen Brunswick RDCS Referring Phys:  [id number] [place] Diagnosing Phys: [place]  1. Left ventricular ejection fraction, by estimation, is 60 to 65%. The left ventricle has normal function. The left ventricle has no regional wall motion abnormalities. There is moderate concentric left ventricular hypertrophy. Left ventricular diastolic parameters are consistent with Grade I diastolic dysfunction (impaired relaxation).  2. Right ventricular systolic function is normal. The right ventricular size is normal.  3. Left atrial size was mildly dilated.  4. The mitral valve is normal in structure. Trivial mitral valve regurgitation. No evidence of mitral stenosis.  5. The aortic valve is normal in structure. Aortic valve regurgitation is mild. No aortic stenosis is present.  6. The inferior vena cava is normal in size with greater than 50% respiratory variability, suggesting right atrial pressure of 3 mmHg. FINDINGS  Left Ventricle: Left ventricular ejection fraction,  by estimation, is 60 to 65%. The left ventricle has normal function. The left ventricle has no regional wall motion abnormalities. The left ventricular internal cavity size was normal in  size. There is  moderate concentric left ventricular hypertrophy. Left ventricular diastolic parameters are consistent with Grade I diastolic dysfunction (impaired relaxation). Right Ventricle: The right ventricular size is normal. No increase in right ventricular wall thickness. Right ventricular systolic function is normal. Left Atrium: Left atrial size was mildly dilated. Right Atrium: Right atrial size was normal in size. Pericardium: There is no evidence of pericardial effusion. Mitral Valve: The mitral valve is normal in structure. Trivial mitral valve regurgitation. No evidence of mitral valve stenosis. Tricuspid Valve: The tricuspid valve is normal in structure. Tricuspid valve regurgitation is trivial. No evidence of tricuspid stenosis. Aortic Valve: The aortic valve is normal in structure. Aortic valve regurgitation is mild. Aortic regurgitation PHT measures 595 msec. No aortic stenosis is present. Aortic valve peak gradient measures 4.3 mmHg. Pulmonic Valve: The pulmonic valve was normal in structure. Pulmonic valve regurgitation is not visualized. No evidence of pulmonic stenosis. Aorta: The aortic root is normal in size and structure. Venous: The inferior vena cava is normal in size with greater than 50% respiratory variability, suggesting right atrial pressure of 3 mmHg. IAS/Shunts: No atrial level shunt detected by color flow Doppler.  LEFT VENTRICLE PLAX 2D LVIDd:         4.30 cm     Diastology LVIDs:         2.70 cm     LV e' medial:    8.00 cm/s LV PW:         1.10 cm     LV E/e' medial:  5.5 LV IVS:        1.30 cm     LV e' lateral:   10.20 cm/s LVOT diam:     2.50 cm     LV E/e' lateral: 4.3 LV SV:         91 LV SV Index:   47 LVOT Area:     4.91 cm  LV Volumes (MOD) LV vol d, MOD A2C: 36.4 ml LV vol d, MOD A4C:  72.7 ml LV vol s, MOD A2C: 15.2 ml LV vol s, MOD A4C: 28.4 ml LV SV MOD A2C:     21.2 ml LV SV MOD A4C:     72.7 ml LV SV MOD BP:      31.5 ml RIGHT VENTRICLE RV Basal diam:  3.00 cm RV S prime:     16.20 cm/s TAPSE (M-mode): 2.0 cm LEFT ATRIUM           Index       RIGHT ATRIUM           Index LA diam:      3.30 cm 1.69 cm/m  RA Area:     12.10 cm LA Vol (A2C): 19.5 ml 9.98 ml/m  RA Volume:   24.10 ml  12.33 ml/m LA Vol (A4C): 18.3 ml 9.36 ml/m  AORTIC VALVE                 PULMONIC VALVE AV Area (Vmax): 3.63 cm     PV Vmax:          0.96 m/s AV Vmax:        104.00 cm/s  PV Peak grad:     3.7 mmHg AV Peak Grad:   4.3 mmHg     PR End Diast Vel: 3.70 msec LVOT Vmax:      77.00 cm/s LVOT Vmean:     48.700 cm/s LVOT VTI:       0.186 m AI PHT:  595 msec  AORTA Ao Root diam: 3.70 cm Ao Asc diam:  3.50 cm MITRAL VALVE MV Area (PHT): 2.19 cm    SHUNTS MV Decel Time: 346 msec    Systemic VTI:  0.19 m MV E velocity: 44.00 cm/s  Systemic Diam: 2.50 cm MV A velocity: 58.95 cm/s MV E/A ratio:  0.75 Shaukat Khan Electronically signed by Neoma Laming Signature Date/Time: 01/18/2022/11:37:28 AM    Final    DG Chest Portable 1 View  Result Date: 01/17/2022 CLINICAL DATA:  Syncopal episodes and weakness. EXAM: PORTABLE CHEST 1 VIEW COMPARISON:  PA Lat 03/14/2018. FINDINGS: The heart size is normal and mediastinal contours are stable with mild aortic ectasia and tortuosity, mild aortic atherosclerosis. Both lungs are clear. The visualized skeletal structures are unremarkable. IMPRESSION: No active disease.  Stable chest. Electronically Signed   By: Telford Nab M.D.   On: 01/17/2022 23:35   CT Head Wo Contrast  Result Date: 01/17/2022 CLINICAL DATA:  Syncope and lower extremity weakness. EXAM: CT HEAD WITHOUT CONTRAST TECHNIQUE: Contiguous axial images were obtained from the base of the skull through the vertex without intravenous contrast. RADIATION DOSE REDUCTION: This exam was performed according to the  departmental dose-optimization program which includes automated exposure control, adjustment of the mA and/or kV according to patient size and/or use of iterative reconstruction technique. COMPARISON:  March 10, 2018 FINDINGS: Brain: There is mild cerebral atrophy with widening of the extra-axial spaces and ventricular dilatation. There are areas of decreased attenuation within the white matter tracts of the supratentorial brain, consistent with microvascular disease changes. Small, chronic bilateral basal ganglia lacunar infarcts are noted. Vascular: No hyperdense vessel or unexpected calcification. Skull: A right frontoparietal craniotomy defect is seen. Sinuses/Orbits: A chronic deformity is seen involving the medial wall of the left orbit. Other: None. IMPRESSION: 1. No acute intracranial abnormality. 2. Generalized cerebral atrophy and microvascular disease changes of the supratentorial brain. 3. Small, chronic bilateral basal ganglia lacunar infarcts. 4. Right frontoparietal craniotomy defect. 5. Chronic deformity involving the medial wall of the left orbit. Electronically Signed   By: Virgina Norfolk M.D.   On: 01/17/2022 23:02   (Echo, Carotid, EGD, Colonoscopy, ERCP)    Subjective: Pt denies any complaints    Discharge Exam: Vitals:   01/18/22 1200 01/18/22 1507  BP: (!) 138/93 (!) 149/81  Pulse: (!) 51 65  Resp: 20 18  Temp: 98.4 F (36.9 C) 98.4 F (36.9 C)  SpO2: 99% 98%   Vitals:   01/18/22 0930 01/18/22 1000 01/18/22 1200 01/18/22 1507  BP: 130/68 (!) 144/71 (!) 138/93 (!) 149/81  Pulse: 60 (!) 52 (!) 51 65  Resp: '14 15 20 18  '$ Temp:   98.4 F (36.9 C) 98.4 F (36.9 C)  TempSrc:   Oral Oral  SpO2: 95% 93% 99% 98%  Weight:      Height:        General: Pt is alert, awake, not in acute distress Cardiovascular: S1/S2 +, no rubs, no gallops Respiratory: CTA bilaterally, no wheezing, no rhonchi Abdominal: Soft, NT, ND, bowel sounds + Extremities: no edema, no  cyanosis    The results of significant diagnostics from this hospitalization (including imaging, microbiology, ancillary and laboratory) are listed below for reference.     Microbiology: Recent Results (from the past 240 hour(s))  Resp Panel by RT-PCR (Flu A&B, Covid) Anterior Nasal Swab     Status: None   Collection Time: 01/17/22 11:30 PM   Specimen: Anterior Nasal Swab  Result  Value Ref Range Status   SARS Coronavirus 2 by RT PCR NEGATIVE NEGATIVE Final    Comment: (NOTE) SARS-CoV-2 target nucleic acids are NOT DETECTED.  The SARS-CoV-2 RNA is generally detectable in upper respiratory specimens during the acute phase of infection. The lowest concentration of SARS-CoV-2 viral copies this assay can detect is 138 copies/mL. A negative result does not preclude SARS-Cov-2 infection and should not be used as the sole basis for treatment or other patient management decisions. A negative result may occur with  improper specimen collection/handling, submission of specimen other than nasopharyngeal swab, presence of viral mutation(s) within the areas targeted by this assay, and inadequate number of viral copies(<138 copies/mL). A negative result must be combined with clinical observations, patient history, and epidemiological information. The expected result is Negative.  Fact Sheet for Patients:  EntrepreneurPulse.com.au  Fact Sheet for Healthcare Providers:  IncredibleEmployment.be  This test is no t yet approved or cleared by the [place] FDA and  has been authorized for detection and/or diagnosis of SARS-CoV-2 by FDA under an Emergency Use Authorization (EUA). This EUA will remain  in effect (meaning this test can be used) for the duration of the COVID-19 declaration under Section 564(b)(1) of the Act, 21 U.S.C.section 360bbb-3(b)(1), unless the authorization is terminated  or revoked sooner.       Influenza A by PCR NEGATIVE NEGATIVE  Final   Influenza B by PCR NEGATIVE NEGATIVE Final    Comment: (NOTE) The Xpert Xpress SARS-CoV-2/FLU/RSV plus assay is intended as an aid in the diagnosis of influenza from Nasopharyngeal swab specimens and should not be used as a sole basis for treatment. Nasal washings and aspirates are unacceptable for Xpert Xpress SARS-CoV-2/FLU/RSV testing.  Fact Sheet for Patients: EntrepreneurPulse.com.au  Fact Sheet for Healthcare Providers: IncredibleEmployment.be  This test is not yet approved or cleared by the [place] FDA and has been authorized for detection and/or diagnosis of SARS-CoV-2 by FDA under an Emergency Use Authorization (EUA). This EUA will remain in effect (meaning this test can be used) for the duration of the COVID-19 declaration under Section 564(b)(1) of the Act, 21 U.S.C. section 360bbb-3(b)(1), unless the authorization is terminated or revoked.  Performed at [facility], [address] [place], [place] [address]      Labs: BNP (last 3 results) No results for input(s): "BNP" in the last 8760 hours. Basic Metabolic Panel: Recent Labs  Lab 01/17/22 1725 01/18/22 0425  NA 137 138  K 3.8 4.1  CL 105 108  CO2 20* 22  GLUCOSE 143* 105*  BUN 30* 28*  CREATININE 1.88* 1.56*  CALCIUM 9.4 9.3   Liver Function Tests: No results for input(s): "AST", "ALT", "ALKPHOS", "BILITOT", "PROT", "ALBUMIN" in the last 168 hours. No results for input(s): "LIPASE", "AMYLASE" in the last 168 hours. No results for input(s): "AMMONIA" in the last 168 hours. CBC: Recent Labs  Lab 01/17/22 1725 01/18/22 0425  WBC 11.0* 9.4  HGB 12.5* 12.1*  HCT 38.9* 36.8*  MCV 96.8 96.1  PLT 312 259   Cardiac Enzymes: No results for input(s): "CKTOTAL", "CKMB", "CKMBINDEX", "TROPONINI" in the last 168 hours. BNP: Invalid input(s): "POCBNP" CBG: No results for input(s): "GLUCAP" in the last 168 hours. D-Dimer No results for  input(s): "DDIMER" in the last 72 hours. Hgb A1c No results for input(s): "HGBA1C" in the last 72 hours. Lipid Profile No results for input(s): "CHOL", "HDL", "LDLCALC", "TRIG", "CHOLHDL", "LDLDIRECT" in the last 72 hours. Thyroid function studies No results for input(s): "  TSH", "T4TOTAL", "T3FREE", "THYROIDAB" in the last 72 hours.  Invalid input(s): "FREET3" Anemia work up No results for input(s): "VITAMINB12", "FOLATE", "FERRITIN", "TIBC", "IRON", "RETICCTPCT" in the last 72 hours. Urinalysis    Component Value Date/Time   COLORURINE YELLOW (A) 01/18/2022 0048   APPEARANCEUR CLEAR (A) 01/18/2022 0048   APPEARANCEUR Clear 01/17/2020 1550   LABSPEC 1.012 01/18/2022 0048   LABSPEC 1.010 04/01/2014 1721   PHURINE 5.0 01/18/2022 0048   GLUCOSEU NEGATIVE 01/18/2022 0048   GLUCOSEU Negative 04/01/2014 1721   HGBUR NEGATIVE 01/18/2022 0048   BILIRUBINUR NEGATIVE 01/18/2022 0048   BILIRUBINUR Negative 01/17/2020 1550   BILIRUBINUR Negative 04/01/2014 1721   KETONESUR NEGATIVE 01/18/2022 0048   PROTEINUR NEGATIVE 01/18/2022 0048   NITRITE NEGATIVE 01/18/2022 0048   LEUKOCYTESUR NEGATIVE 01/18/2022 0048   LEUKOCYTESUR Negative 04/01/2014 1721   Sepsis Labs Recent Labs  Lab 01/17/22 1725 01/18/22 0425  WBC 11.0* 9.4   Microbiology Recent Results (from the past 240 hour(s))  Resp Panel by RT-PCR (Flu A&B, Covid) Anterior Nasal Swab     Status: None   Collection Time: 01/17/22 11:30 PM   Specimen: Anterior Nasal Swab  Result Value Ref Range Status   SARS Coronavirus 2 by RT PCR NEGATIVE NEGATIVE Final    Comment: (NOTE) SARS-CoV-2 target nucleic acids are NOT DETECTED.  The SARS-CoV-2 RNA is generally detectable in upper respiratory specimens during the acute phase of infection. The lowest concentration of SARS-CoV-2 viral copies this assay can detect is 138 copies/mL. A negative result does not preclude SARS-Cov-2 infection and should not be used as the sole basis for  treatment or other patient management decisions. A negative result may occur with  improper specimen collection/handling, submission of specimen other than nasopharyngeal swab, presence of viral mutation(s) within the areas targeted by this assay, and inadequate number of viral copies(<138 copies/mL). A negative result must be combined with clinical observations, patient history, and epidemiological information. The expected result is Negative.  Fact Sheet for Patients:  EntrepreneurPulse.com.au  Fact Sheet for Healthcare Providers:  IncredibleEmployment.be  This test is no t yet approved or cleared by the [place] FDA and  has been authorized for detection and/or diagnosis of SARS-CoV-2 by FDA under an Emergency Use Authorization (EUA). This EUA will remain  in effect (meaning this test can be used) for the duration of the COVID-19 declaration under Section 564(b)(1) of the Act, 21 U.S.C.section 360bbb-3(b)(1), unless the authorization is terminated  or revoked sooner.       Influenza A by PCR NEGATIVE NEGATIVE Final   Influenza B by PCR NEGATIVE NEGATIVE Final    Comment: (NOTE) The Xpert Xpress SARS-CoV-2/FLU/RSV plus assay is intended as an aid in the diagnosis of influenza from Nasopharyngeal swab specimens and should not be used as a sole basis for treatment. Nasal washings and aspirates are unacceptable for Xpert Xpress SARS-CoV-2/FLU/RSV testing.  Fact Sheet for Patients: EntrepreneurPulse.com.au  Fact Sheet for Healthcare Providers: IncredibleEmployment.be  This test is not yet approved or cleared by the [place] FDA and has been authorized for detection and/or diagnosis of SARS-CoV-2 by FDA under an Emergency Use Authorization (EUA). This EUA will remain in effect (meaning this test can be used) for the duration of the COVID-19 declaration under Section 564(b)(1) of the Act, 21  U.S.C. section 360bbb-3(b)(1), unless the authorization is terminated or revoked.  Performed at [facility], [address], [place], [place] [address]      Time coordinating discharge: Over 30 minutes  SIGNED:   Wyvonnia Dusky, MD  [facility] 01/18/2022, 3:48 PM Pager   If 7PM-7AM, please contact night-coverage [email or web address]

## 2022-01-18 NOTE — Assessment & Plan Note (Signed)
-   The patient will be admitted to a medical telemetry bed. - We will follow serial troponins. - Neurochecks will be followed every 4 hours for 24 hours. - We will obtain a 2D echo and bilateral carotid Doppler. - She will be monitored for arrhythmias. - She will be hydrated with IV normal saline. - We will follow his orthostatics.

## 2022-01-18 NOTE — H&P (Addendum)
[place]   PATIENT NAME: Edward Weber    MR#:  [id number]  DATE OF BIRTH:  May 03, 1960  DATE OF ADMISSION:  01/17/2022  PRIMARY CARE PHYSICIAN: [name], MD   Patient is coming from: Home  REQUESTING/REFERRING PHYSICIAN:, Dylan, MD  CHIEF COMPLAINT:   Chief Complaint  Patient presents with   Near Syncope   Emesis    HISTORY OF PRESENT ILLNESS:  Edward Weber is a 61 y.o. Caucasian male with medical history significant for EtOH abuse, depression, GERD, hypertension, peripheral neuropathy, and CVA, who presented to the emergency room with acute onset of recurrent syncope with associated multiple falls and subsequent difficulty with ambulation.  Patient lives alone and only uses a cane for ambulation.  He admitted to central chest pain felt as cramping with associated palpitations.  He graded 7/10 in severity and he had nausea, vomiting and diaphoresis with it.  This happened at all before he had his syncope.  He admits to headache and light sensitivity.  No cough or wheezing or dyspnea.  No diarrhea or melena or bright red bleeding per rectum.  No dysuria, oliguria or hematuria or flank pain.  No other bleeding diathesis.  He claimed that he smokes 2 cigarettes/week and drinks a beer every now and then.  ED Course: When he came to the ER, vital signs were within normal.Labs revealed a BUN of 30 and creatinine 1.88 close to previous levels.  CBC showed mild leukocytosis of 11 and anemia close to baseline. EKG as reviewed by me : EKG showed sinus rhythm with a rate of 77 with incomplete left bundle branch block and probable LVH with prolonged QT interval with QTc of 484. Imaging: Portable chest x-ray showed no acute cardiopulmonary disease. Noncontrasted head CT scan revealed no acute intracranial normality.  It showed generalized cerebral atrophy and microvascular disease changes of the supratentorial brain, small chronic bilateral basal ganglia lacunar infarcts, right  frontoparietal craniotomy defect and chronic deformity involving the medial wall of the left orbit.  The patient was given 1 L bolus of IV lactated Ringer, 10 mg of IV Compazine, 1 L bolus of IV normal saline and 2 p.o. Fioricet.  He will be admitted to an observation medical telemetry bed for further evaluation and management. PAST MEDICAL HISTORY:   Past Medical History:  Diagnosis Date   Alcoholism ([place])    Allergic rhinitis    Allergic state    Anemia    Arthritis    Cancer (HCC)    Chronic hepatitis C (HCC)    Depression    Esophagitis, reflux    GERD (gastroesophageal reflux disease)    Helicobacter pylori gastritis    Hepatitis    Hypertension    Neuromuscular disorder (HCC)    Osteoarthrosis    Peripheral neuropathy    Rhabdomyolysis    Sickle cell anemia (HCC)    Stroke (HCC)    Stroke ([place])    Vitiligo     PAST SURGICAL HISTORY:   Past Surgical History:  Procedure Laterality Date   COLON SURGERY     COLONOSCOPY WITH PROPOFOL N/A 05/09/2015   Procedure: COLONOSCOPY WITH PROPOFOL;  Surgeon: Hulen Luster, MD;  Location: [facility] ENDOSCOPY;  Service: Gastroenterology;  Laterality: N/A;   ESOPHAGOGASTRODUODENOSCOPY N/A 05/09/2015   Procedure: ESOPHAGOGASTRODUODENOSCOPY (EGD);  Surgeon: Hulen Luster, MD;  Location: [facility] ENDOSCOPY;  Service: Gastroenterology;  Laterality: N/A;   ESOPHAGOGASTRODUODENOSCOPY (EGD) WITH PROPOFOL N/A 05/04/2017   Procedure: ESOPHAGOGASTRODUODENOSCOPY (EGD) WITH  PROPOFOL;  Surgeon: Toledo, Benay Pike, MD;  Location: [facility] ENDOSCOPY;  Service: Gastroenterology;  Laterality: N/A;   FINGER DEBRIDEMENT  1976   s/p infected dog bite   TEE WITHOUT CARDIOVERSION N/A 05/20/2012   Procedure: TRANSESOPHAGEAL ECHOCARDIOGRAM (TEE);  Surgeon: Fay Records, MD;  Location: [facility] ENDOSCOPY;  Service: Cardiovascular;  Laterality: N/A;    SOCIAL HISTORY:   Social History   Tobacco Use   Smoking status: Every Day    Types: Cigarettes   Smokeless tobacco: Never   Substance Use Topics   Alcohol use: Yes    Alcohol/week: 9.0 standard drinks of alcohol    Types: 6 Cans of beer, 3 Shots of liquor per week    Comment: only on the weekends    FAMILY HISTORY:  No family history on file.  No familial diseases per his report.  DRUG ALLERGIES:   Allergies  Allergen Reactions   Bee Venom Anaphylaxis   Beef-Derived Products     Personal preference   Lactalbumin    Milk-Related Compounds Nausea And Vomiting    Intoloerance   Pegademase Bovine     Other reaction(s): Unknown Personal preference Personal preference Personal preference    Tilactase Nausea And Vomiting    Intoloerance    REVIEW OF SYSTEMS:   ROS As per history of present illness. All pertinent systems were reviewed above. Constitutional, HEENT, cardiovascular, respiratory, GI, GU, musculoskeletal, neuro, psychiatric, endocrine, integumentary and hematologic systems were reviewed and are otherwise negative/unremarkable except for positive findings mentioned above in the HPI.   MEDICATIONS AT HOME:   Prior to Admission medications   Medication Sig Start Date End Date Taking? Authorizing Provider  albuterol (PROVENTIL HFA;VENTOLIN HFA) 108 (90 Base) MCG/ACT inhaler Inhale into the lungs every 6 (six) hours as needed for wheezing or shortness of breath.    [provider]  allopurinol (ZYLOPRIM) 100 MG tablet Take 100 mg by mouth 2 (two) times daily.    [provider]  amLODipine (NORVASC) 5 MG tablet  02/29/20   [provider]  clobetasol cream (TEMOVATE) 9.37 % Apply 1 application topically 2 (two) times daily.    [provider]  colchicine 0.6 MG tablet Take 1 tablet (0.6 mg total) by mouth 2 (two) times daily. 05/27/12   Love, Ivan Anchors, PA-C  cyanocobalamin 1000 MCG tablet Take 1,000 mcg by mouth daily.    [provider]  doxepin (SINEQUAN) 25 MG capsule Take 1 capsule (25 mg total) by mouth at bedtime. 05/27/12   Love, Ivan Anchors,  PA-C  DULoxetine (CYMBALTA) 60 MG capsule Take 1 capsule (60 mg total) by mouth daily. 05/27/12   Love, Ivan Anchors, PA-C  escitalopram (LEXAPRO) 20 MG tablet Take 20 mg by mouth daily.    [provider]  famotidine (PEPCID) 20 MG tablet Take 20 mg by mouth 2 (two) times daily.    [provider]  ferrous gluconate (FERGON) 324 MG tablet Take 324 mg by mouth daily with breakfast.    [provider]  fluticasone (FLONASE) 50 MCG/ACT nasal spray Place 2 sprays into both nostrils daily.    [provider]  gabapentin (NEURONTIN) 100 MG capsule  01/23/20   [provider]  gabapentin (NEURONTIN) 300 MG capsule Take 1 capsule (300 mg total) by mouth 2 (two) times daily. 05/27/12   Love, Ivan Anchors, PA-C  hydrocortisone cream 1 % Apply 1 application topically daily as needed for itching.    [provider]  hydrOXYzine (ATARAX/VISTARIL) 25  MG tablet Take 25 mg by mouth 3 (three) times daily as needed. For itching    [provider]  lansoprazole (PREVACID) 15 MG capsule Take 15 mg by mouth daily as needed. For acid reflux    [provider]  lisinopril (ZESTRIL) 20 MG tablet Take 20 mg by mouth daily.    [provider]  lisinopril-hydrochlorothiazide (ZESTORETIC) 20-12.5 MG tablet Take 2 tablets by mouth daily. 09/03/21   [provider]  meloxicam (MOBIC) 15 MG tablet Take 1 tablet (15 mg total) by mouth daily. 11/19/15   Sable Feil, PA-C  Multiple Vitamin (MULTIVITAMIN) tablet Take 1 tablet by mouth daily.    [provider]  naproxen (NAPROSYN) 500 MG tablet Take 1 tablet (500 mg total) by mouth 2 (two) times daily with a meal. 03/14/18   Lavonia Drafts, MD  oxyCODONE-acetaminophen (ROXICET) 5-325 MG per tablet Take 1-2 tablets by mouth every 4 (four) hours as needed (Pain). 05/03/13   Lisette Abu, PA-C  pantoprazole (PROTONIX) 40 MG tablet Take 1 tablet (40 mg total) by mouth daily. 02/15/15 02/15/16   Nance Pear, MD  predniSONE (DELTASONE) 20 MG tablet Take 20 mg by mouth daily with breakfast.    [provider]  ranitidine (ZANTAC) 150 MG capsule Take 150 mg by mouth 2 (two) times daily.    [provider]  senna-docusate (SENOKOT-S) 8.6-50 MG per tablet Take 1 tablet by mouth 2 (two) times daily as needed for mild constipation.    [provider]  sucralfate (CARAFATE) 1 G tablet Take 1 tablet (1 g total) by mouth 4 (four) times daily. 02/15/15   Nance Pear, MD  tadalafil (CIALIS) 20 MG tablet 1-5 tablets one hour prior to intercourse as needed. 11/13/21   Hollice Espy, MD  thiamine 100 MG tablet Take 100 mg by mouth daily.    [provider]  tiZANidine (ZANAFLEX) 4 MG capsule Take 4 mg by mouth 3 (three) times daily.    [provider]      VITAL SIGNS:  Blood pressure (!) 155/78, pulse (!) 56, temperature 97.8 F (36.6 C), temperature source Oral, resp. rate 17, height '5\' 8"'$  (1.727 m), weight 81.6 kg, SpO2 97 %.  PHYSICAL EXAMINATION:  Physical Exam  GENERAL:  61 y.o.-year-old Caucasian male patient lying in the bed with no acute distress.  EYES: Pupils equal, round, reactive to light and accommodation. No scleral icterus. Extraocular muscles intact.  HEENT: Head atraumatic, normocephalic. Oropharynx and nasopharynx clear.  NECK:  Supple, no jugular venous distention. No thyroid enlargement, no tenderness.  LUNGS: Normal breath sounds bilaterally, no wheezing, rales,rhonchi or crepitation. No use of accessory muscles of respiration.  CARDIOVASCULAR: Regular rate and rhythm, S1, S2 normal. No murmurs, rubs, or gallops.  ABDOMEN: Soft, nondistended, nontender. Bowel sounds present. No organomegaly or mass.  EXTREMITIES: No pedal edema, cyanosis, or clubbing.  NEUROLOGIC: Cranial nerves II through XII are intact. Muscle strength 5/5 in all extremities. Sensation intact. Gait not checked.  PSYCHIATRIC: The patient is alert and  oriented x 3.  Normal affect and good eye contact. SKIN: No obvious rash, lesion, or ulcer.   LABORATORY PANEL:   CBC Recent Labs  Lab 01/18/22 0425  WBC 9.4  HGB 12.1*  HCT 36.8*  PLT 259   ------------------------------------------------------------------------------------------------------------------  Chemistries  Recent Labs  Lab 01/18/22 0425  NA 138  K 4.1  CL 108  CO2 22  GLUCOSE 105*  BUN 28*  CREATININE 1.56*  CALCIUM 9.3   ------------------------------------------------------------------------------------------------------------------  Cardiac Enzymes No results for input(s): "TROPONINI" in the last 168 hours. ------------------------------------------------------------------------------------------------------------------  RADIOLOGY:  DG Chest Portable 1 View  Result Date: 01/17/2022 CLINICAL DATA:  Syncopal episodes and weakness. EXAM: PORTABLE CHEST 1 VIEW COMPARISON:  PA Lat 03/14/2018. FINDINGS: The heart size is normal and mediastinal contours are stable with mild aortic ectasia and tortuosity, mild aortic atherosclerosis. Both lungs are clear. The visualized skeletal structures are unremarkable. IMPRESSION: No active disease.  Stable chest. Electronically Signed   By: Telford Nab M.D.   On: 01/17/2022 23:35   CT Head Wo Contrast  Result Date: 01/17/2022 CLINICAL DATA:  Syncope and lower extremity weakness. EXAM: CT HEAD WITHOUT CONTRAST TECHNIQUE: Contiguous axial images were obtained from the base of the skull through the vertex without intravenous contrast. RADIATION DOSE REDUCTION: This exam was performed according to the departmental dose-optimization program which includes automated exposure control, adjustment of the mA and/or kV according to patient size and/or use of iterative reconstruction technique. COMPARISON:  March 10, 2018 FINDINGS: Brain: There is mild cerebral atrophy with widening of the extra-axial spaces and ventricular  dilatation. There are areas of decreased attenuation within the white matter tracts of the supratentorial brain, consistent with microvascular disease changes. Small, chronic bilateral basal ganglia lacunar infarcts are noted. Vascular: No hyperdense vessel or unexpected calcification. Skull: A right frontoparietal craniotomy defect is seen. Sinuses/Orbits: A chronic deformity is seen involving the medial wall of the left orbit. Other: None. IMPRESSION: 1. No acute intracranial abnormality. 2. Generalized cerebral atrophy and microvascular disease changes of the supratentorial brain. 3. Small, chronic bilateral basal ganglia lacunar infarcts. 4. Right frontoparietal craniotomy defect. 5. Chronic deformity involving the medial wall of the left orbit. Electronically Signed   By: Virgina Norfolk M.D.   On: 01/17/2022 23:02      IMPRESSION AND PLAN:  Assessment and Plan: * Recurrent syncope - The patient will be admitted to a medical telemetry bed. - We will follow serial troponins. - Neurochecks will be followed every 4 hours for 24 hours. - We will obtain a 2D echo and bilateral carotid Doppler. - She will be monitored for arrhythmias. - She will be hydrated with IV normal saline. - We will follow his orthostatics.  Chest pain .-He will be placed on as needed sublingual nitroglycerin and IV morphine sulfate for pain. - We will place him on aspirin as well as statin therapy. - Cardiology consult will be obtained. - We will follow serial troponins. - I notified Dr. Saralyn Pilar about the patient.  Alcohol abuse - He stated he randomly drinks but his recurrent falls could be related to alcoholism as well. - He will be monitored for alcohol withdrawal while he is here. - We will place on as needed IV Ativan.  Essential hypertension - We will continue amlodipine and Zestril.  GERD without esophagitis - We will continue PPI therapy as well as Carafate.  Peripheral neuropathy - We will  continue Neurontin.  Gout - We will continue allopurinol and colchicine.  Stage III chronic kidney disease ([place]) - The patient will be hydrated with IV normal saline and will follow his BMP. -His renal functions are comparable to previous levels last year.   DVT prophylaxis: Lovenox.  Advanced Care Planning:  Code Status: full code.  Family Communication:  The plan of care was discussed in details with the patient (and family). I answered all questions. The patient agreed to proceed with the above mentioned plan. Further management will depend upon hospital course. Disposition  Plan: Back to previous home environment Consults called: none.  All the records are reviewed and case discussed with ED provider.  Status is: Observation  I certify that at the time of admission, it is my clinical judgment that the patient will require hospital care extending less than 2 midnights.                            Dispo: The patient is from: Home              Anticipated d/c is to: Home              Patient currently is not medically stable to d/c.              Difficult to place patient: No  Christel Mormon M.D on 01/18/2022 at 5:43 AM  [facility]   From 7 PM-7 AM, contact night-coverage [email or web address]  CC: Primary care physician; [name], MD

## 2022-01-26 ENCOUNTER — Ambulatory Visit (INDEPENDENT_AMBULATORY_CARE_PROVIDER_SITE_OTHER): Payer: Self-pay | Admitting: Podiatry

## 2022-01-26 DIAGNOSIS — Z91199 Patient's noncompliance with other medical treatment and regimen due to unspecified reason: Secondary | ICD-10-CM

## 2022-01-29 NOTE — Progress Notes (Signed)
Patient was no-show for appointment today 

## 2022-05-11 ENCOUNTER — Emergency Department: Payer: 59

## 2022-05-11 ENCOUNTER — Other Ambulatory Visit: Payer: Self-pay

## 2022-05-11 ENCOUNTER — Encounter: Payer: Self-pay | Admitting: Emergency Medicine

## 2022-05-11 ENCOUNTER — Emergency Department
Admission: EM | Admit: 2022-05-11 | Discharge: 2022-05-11 | Disposition: A | Discharge status: Home / Self Care | Payer: 59 | Attending: Emergency Medicine | Admitting: Emergency Medicine

## 2022-05-11 DIAGNOSIS — I10 Essential (primary) hypertension: Secondary | ICD-10-CM | POA: Insufficient documentation

## 2022-05-11 DIAGNOSIS — Z1152 Encounter for screening for COVID-19: Secondary | ICD-10-CM | POA: Diagnosis not present

## 2022-05-11 DIAGNOSIS — R42 Dizziness and giddiness: Secondary | ICD-10-CM | POA: Insufficient documentation

## 2022-05-11 LAB — URINALYSIS, ROUTINE W REFLEX MICROSCOPIC
Bacteria, UA: NONE SEEN
Bilirubin Urine: NEGATIVE
Glucose, UA: NEGATIVE mg/dL
Ketones, ur: NEGATIVE mg/dL
Leukocytes,Ua: NEGATIVE
Nitrite: NEGATIVE
Protein, ur: NEGATIVE mg/dL
Specific Gravity, Urine: 1.011 (ref 1.005–1.030)
Squamous Epithelial / HPF: NONE SEEN /HPF (ref 0–5)
WBC, UA: NONE SEEN WBC/hpf (ref 0–5)
pH: 5 (ref 5.0–8.0)

## 2022-05-11 LAB — CBC WITH DIFFERENTIAL/PLATELET
Abs Immature Granulocytes: 0.05 10*3/uL (ref 0.00–0.07)
Basophils Absolute: 0 10*3/uL (ref 0.0–0.1)
Basophils Relative: 1 %
Eosinophils Absolute: 0.2 10*3/uL (ref 0.0–0.5)
Eosinophils Relative: 3 %
HCT: 34.8 % — ABNORMAL LOW (ref 39.0–52.0)
Hemoglobin: 11.5 g/dL — ABNORMAL LOW (ref 13.0–17.0)
Immature Granulocytes: 1 %
Lymphocytes Relative: 21 %
Lymphs Abs: 1.3 10*3/uL (ref 0.7–4.0)
MCH: 31.1 pg (ref 26.0–34.0)
MCHC: 33 g/dL (ref 30.0–36.0)
MCV: 94.1 fL (ref 80.0–100.0)
Monocytes Absolute: 0.6 10*3/uL (ref 0.1–1.0)
Monocytes Relative: 10 %
Neutro Abs: 4.1 10*3/uL (ref 1.7–7.7)
Neutrophils Relative %: 64 %
Platelets: 253 10*3/uL (ref 150–400)
RBC: 3.7 MIL/uL — ABNORMAL LOW (ref 4.22–5.81)
RDW: 13.6 % (ref 11.5–15.5)
WBC: 6.3 10*3/uL (ref 4.0–10.5)
nRBC: 0 % (ref 0.0–0.2)

## 2022-05-11 LAB — COMPREHENSIVE METABOLIC PANEL
ALT: 30 U/L (ref 0–44)
AST: 34 U/L (ref 15–41)
Albumin: 4.1 g/dL (ref 3.5–5.0)
Alkaline Phosphatase: 53 U/L (ref 38–126)
Anion gap: 11 (ref 5–15)
BUN: 18 mg/dL (ref 8–23)
CO2: 22 mmol/L (ref 22–32)
Calcium: 9.2 mg/dL (ref 8.9–10.3)
Chloride: 102 mmol/L (ref 98–111)
Creatinine, Ser: 1.01 mg/dL (ref 0.61–1.24)
GFR, Estimated: >60 mL/min (ref >60)
Glucose, Bld: 103 mg/dL — ABNORMAL HIGH (ref 70–99)
Potassium: 3.5 mmol/L (ref 3.5–5.1)
Sodium: 135 mmol/L (ref 135–145)
Total Bilirubin: 0.7 mg/dL (ref 0.3–1.2)
Total Protein: 7.3 g/dL (ref 6.5–8.1)

## 2022-05-11 LAB — RESP PANEL BY RT-PCR (RSV, FLU A&B, COVID)  RVPGX2
Influenza A by PCR: NEGATIVE
Influenza B by PCR: NEGATIVE
Resp Syncytial Virus by PCR: NEGATIVE
SARS Coronavirus 2 by RT PCR: NEGATIVE

## 2022-05-11 LAB — TROPONIN I (HIGH SENSITIVITY)
Troponin I (High Sensitivity): 7 ng/L (ref <18)
Troponin I (High Sensitivity): 6 ng/L (ref <18)

## 2022-05-11 MED ORDER — MECLIZINE HCL 25 MG PO TABS
25.0000 mg | ORAL_TABLET | Freq: Once | ORAL | Status: AC
  Administered 2022-05-11: 25 mg via ORAL
  Filled 2022-05-11: qty 1

## 2022-05-11 MED ORDER — IOHEXOL 350 MG/ML SOLN
75.0000 mL | Freq: Once | INTRAVENOUS | Status: AC | PRN
  Administered 2022-05-11: 75 mL via INTRAVENOUS

## 2022-05-11 MED ORDER — CARBAMIDE PEROXIDE 6.5 % OT SOLN
5.0000 [drp] | Freq: Once | OTIC | Status: AC
  Administered 2022-05-11: 5 [drp] via OTIC
  Filled 2022-05-11: qty 15

## 2022-05-11 MED ORDER — MECLIZINE HCL 25 MG PO TABS: 25.0000 mg | ORAL_TABLET | Freq: Two times a day (BID) | ORAL | 0 refills | Status: DC | PRN

## 2022-05-11 NOTE — ED Triage Notes (Signed)
Presents with some dizziness

## 2022-05-11 NOTE — Discharge Instructions (Addendum)
No signs of a new stroke.  This could be vertigo.  Follow-up with ENT.  You can take the meclizine for the dizziness try to stay well-hydrated and call the ENT doctor for follow-up and return to the ER if you develop worsening symptoms or any other concerns.  I also discussed her MRI results that seem old with our neurologist and you should follow-up with your neurologist and start taking a baby aspirin daily or '81mg'$ .   IMPRESSION: 1. No evidence of acute intracranial abnormality. 2. Remote hemorrhagic insult in the right basal ganglia. Remote lacunar infarcts.

## 2022-05-11 NOTE — ED Provider Notes (Signed)
[facility] Provider Note    Event Date/Time   First MD Initiated Contact with Patient 05/11/22 1013     (approximate)   History   Dizziness   HPI  Edward Weber is a 62 y.o. male with history of hypertension comes in with concerns for dizziness.  Patient reports was having some dizziness that he reports started this morning.  Denies any symptoms when he went to bed yesterday.  He denies having this previously.  Denies any chest pain, shortness of breath.  Does have a history of prior stroke but is not been on any aspirin.  He states that he should be taking it but just has not been.  He denies any chest pain, shortness of breath, abdominal pain or other concerns.  Physical Exam   Triage Vital Signs: ED Triage Vitals [05/11/22 1008]  Enc Vitals Group     BP      Pulse      Resp      Temp      Temp src      SpO2      Weight 179 lb 14.3 oz (81.6 kg)     Height '5\' 8"'$  (1.727 m)     Head Circumference      Peak Flow      Pain Score      Pain Loc      Pain Edu?      Excl. in [place]?     Most recent vital signs: Vitals:   05/11/22 1319 05/11/22 1418  BP:    Pulse:  62  Resp:    Temp: 98.9 F (37.2 C)   SpO2:  95%     General: Awake, no distress.  CV:  Good peripheral perfusion.  Resp:  Normal effort.  Abd:  No distention.  Soft and nontender Other:  Cranial nerves are intact.  The equal strength in arms and legs.  Pupils reactive bilaterally.  Extraocular movements are intact.  He has got some wax noted in the left TM.  Finger-to-nose overall reassuring   ED Results / Procedures / Treatments   Labs (all labs ordered are listed, but only abnormal results are displayed) Labs Reviewed  CBC WITH DIFFERENTIAL/PLATELET - Abnormal; Notable for the following components:      Result Value   RBC 3.70 (*)    Hemoglobin 11.5 (*)    HCT 34.8 (*)    All other components within normal limits  COMPREHENSIVE METABOLIC PANEL - Abnormal; Notable for  the following components:   Glucose, Bld 103 (*)    All other components within normal limits  URINALYSIS, ROUTINE W REFLEX MICROSCOPIC - Abnormal; Notable for the following components:   Color, Urine STRAW (*)    APPearance CLEAR (*)    Hgb urine dipstick SMALL (*)    All other components within normal limits  RESP PANEL BY RT-PCR (RSV, FLU A&B, COVID)  RVPGX2  TROPONIN I (HIGH SENSITIVITY)  TROPONIN I (HIGH SENSITIVITY)     EKG  My interpretation of EKG:  Normal sinus rate of 61 without any ST elevation, T wave inversions in 3 aVF V3 V4 and V5, QTc 480.  Reviewed prior EKG from October 2023 and patient had similar T wave inversions  RADIOLOGY I have reviewed the CT had personally interpreted and no evidence of intracranial hemorrhage.   PROCEDURES:  Critical Care performed: No  .1-3 Lead EKG Interpretation  Performed by: Vanessa Tyrone, MD Authorized by: Vanessa Hebron,  MD     Interpretation: normal     ECG rate:  60   ECG rate assessment: normal     Rhythm: sinus rhythm     Ectopy: none     Conduction: normal      MEDICATIONS ORDERED IN ED: Medications  meclizine (ANTIVERT) tablet 25 mg (25 mg Oral Given 05/11/22 1133)  carbamide peroxide (DEBROX) 6.5 % OTIC (EAR) solution 5 drop (5 drops Left EAR Given 05/11/22 1145)  iohexol (OMNIPAQUE) 350 MG/ML injection 75 mL (75 mLs Intravenous Contrast Given 05/11/22 1241)     IMPRESSION / MDM / ASSESSMENT AND PLAN / ED COURSE  I reviewed the triage vital signs and the nursing notes.   Patient's presentation is most consistent with acute presentation with potential threat to life or bodily function.    Differential is stroke, vertigo, related to earwax in the left ear.  ACS.  Will get lab work CT imaging to further evaluate.  CTA negative  IMPRESSION: 1. No evidence of acute intracranial abnormality. 2. Remote hemorrhagic insult in the right basal ganglia. Remote lacunar infarcts.   Asked Dr. Erlinda Hong about the MRI   sounds like he had some uncontrolled HTN in the past, that made him to have lacunar infarcts b/l BG and thalami, and also a small bleeding at right BG/external capsule. this time everything is negative, I would add ASA 81 and ask him follow up with outpt neurology.   Discussed with the patient that he should follow-up with neurology and he states that he is supposed to be taking the aspirin and would restart it.   Troponins are negative x 2.  COVID, flu are negative.  CBC shows hemoglobin around baseline 11.5..  CMP normal creatinine urine without evidence of UTI  Reevaluated patient he reports feeling much better.  He has been ambulatory with his cane.  He feels comfortable with discharge home.  Will do a short course of meclizine and have him follow-up with ENT for possible vertigo.  The patient is on the cardiac monitor to evaluate for evidence of arrhythmia and/or significant heart rate changes.      FINAL CLINICAL IMPRESSION(S) / ED DIAGNOSES   Final diagnoses:  Vertigo  Dizziness     Rx / DC Orders   ED Discharge Orders          Ordered    meclizine (ANTIVERT) 25 MG tablet  2 times daily PRN        05/11/22 1556             Note:  This document was prepared using Dragon voice recognition software and may include unintentional dictation errors.   Vanessa Severance, MD 05/11/22 [phone]

## 2022-05-15 ENCOUNTER — Other Ambulatory Visit: Payer: 59

## 2022-05-15 ENCOUNTER — Emergency Department
Admission: EM | Admit: 2022-05-15 | Discharge: 2022-05-15 | Disposition: A | Discharge status: Home / Self Care | Payer: 59 | Attending: Emergency Medicine | Admitting: Emergency Medicine

## 2022-05-15 ENCOUNTER — Emergency Department: Payer: 59

## 2022-05-15 ENCOUNTER — Other Ambulatory Visit: Payer: Self-pay

## 2022-05-15 DIAGNOSIS — H532 Diplopia: Secondary | ICD-10-CM | POA: Diagnosis present

## 2022-05-15 DIAGNOSIS — H539 Unspecified visual disturbance: Secondary | ICD-10-CM

## 2022-05-15 DIAGNOSIS — H538 Other visual disturbances: Secondary | ICD-10-CM | POA: Diagnosis not present

## 2022-05-15 DIAGNOSIS — C61 Malignant neoplasm of prostate: Secondary | ICD-10-CM

## 2022-05-15 LAB — BASIC METABOLIC PANEL
Anion gap: 11 (ref 5–15)
BUN: 23 mg/dL (ref 8–23)
CO2: 20 mmol/L — ABNORMAL LOW (ref 22–32)
Calcium: 9 mg/dL (ref 8.9–10.3)
Chloride: 106 mmol/L (ref 98–111)
Creatinine, Ser: 1.56 mg/dL — ABNORMAL HIGH (ref 0.61–1.24)
GFR, Estimated: 50 mL/min — ABNORMAL LOW (ref >60)
Glucose, Bld: 157 mg/dL — ABNORMAL HIGH (ref 70–99)
Potassium: 3.6 mmol/L (ref 3.5–5.1)
Sodium: 137 mmol/L (ref 135–145)

## 2022-05-15 LAB — CBC
HCT: 33.7 % — ABNORMAL LOW (ref 39.0–52.0)
Hemoglobin: 11.1 g/dL — ABNORMAL LOW (ref 13.0–17.0)
MCH: 31.3 pg (ref 26.0–34.0)
MCHC: 32.9 g/dL (ref 30.0–36.0)
MCV: 94.9 fL (ref 80.0–100.0)
Platelets: 246 10*3/uL (ref 150–400)
RBC: 3.55 MIL/uL — ABNORMAL LOW (ref 4.22–5.81)
RDW: 13.6 % (ref 11.5–15.5)
WBC: 6.1 10*3/uL (ref 4.0–10.5)
nRBC: 0 % (ref 0.0–0.2)

## 2022-05-15 MED ORDER — MECLIZINE HCL 25 MG PO TABS: 25.0000 mg | ORAL_TABLET | Freq: Two times a day (BID) | ORAL | 0 refills | Status: AC | PRN

## 2022-05-15 NOTE — ED Provider Notes (Signed)
[facility] Provider Note    Event Date/Time   First MD Initiated Contact with Patient 05/15/22 1508     (approximate)   History   Double vision   HPI  Edward Weber is a 62 y.o. male  who presents to the emergency department today with concern for continued double vision and dizziness. Patient was seen in the emergency department for the same complaints 4 days ago. Had MRI performed which did not show any acute abnormality. Was given prescription for meclizine which he says has helped. However he continues to have the symptoms. It does not sound like there are not any specific new symptoms today. He does state that he has almost run out of the meclizine and requesting refill.      Physical Exam   Triage Vital Signs: ED Triage Vitals  Enc Vitals Group     BP 05/15/22 1416 117/71     Pulse Rate 05/15/22 1416 72     Resp 05/15/22 1416 18     Temp 05/15/22 1418 98.2 F (36.8 C)     Temp Source 05/15/22 1418 Oral     SpO2 05/15/22 1416 92 %     Weight 05/15/22 1413 179 lb 14.3 oz (81.6 kg)     Height 05/15/22 1413 '5\' 8"'$  (1.727 m)     Head Circumference --      Peak Flow --      Pain Score 05/15/22 1413 4     Pain Loc --      Pain Edu? --      Excl. in [place]? --     Most recent vital signs: Vitals:   05/15/22 1416 05/15/22 1418  BP: 117/71   Pulse: 72   Resp: 18   Temp:  98.2 F (36.8 C)  SpO2: 92%    General: Awake, alert, oriented. CV:  Good peripheral perfusion. Regular rate and rhythm. Resp:  Normal effort. Lungs clear. Abd:  No distention.  Other:  Left eye with apparent medial rectus palsy. PERRL. Bilateral TMs without bulging or erythema.    ED Results / Procedures / Treatments   Labs (all labs ordered are listed, but only abnormal results are displayed) Labs Reviewed  BASIC METABOLIC PANEL - Abnormal; Notable for the following components:      Result Value   CO2 20 (*)    Glucose, Bld 157 (*)    Creatinine, Ser 1.56 (*)     GFR, Estimated 50 (*)    All other components within normal limits  CBC - Abnormal; Notable for the following components:   RBC 3.55 (*)    Hemoglobin 11.1 (*)    HCT 33.7 (*)    All other components within normal limits  URINALYSIS, ROUTINE W REFLEX MICROSCOPIC  CBG MONITORING, ED     EKG  I, Nance Pear, attending physician, personally viewed and interpreted this EKG  EKG Time: 1426 Rate: 76 Rhythm: normal sinus rhythm Axis: left axis deviation Intervals: qtc 470 QRS: LVH ST changes: no st elevation Impression: abnormal ekg   RADIOLOGY I independently interpreted and visualized the CT head. My interpretation: No bleed Radiology interpretation:  IMPRESSION:  No evidence of acute intracranial abnormality.      PROCEDURES:  Critical Care performed: No  Procedures   MEDICATIONS ORDERED IN ED: Medications - No data to display   IMPRESSION / MDM / [place] / ED COURSE  I reviewed the triage vital signs and the nursing notes.  Differential diagnosis includes, but is not limited to, cranial nerve palsy, CVA  Patient's presentation is most consistent with acute presentation with potential threat to life or bodily function.   Patient presented to the emergency department today with concerns for continued double vision and dizziness.  He denies any eye pain to myself.  He was seen in the emergency department 4 days ago for the same symptoms and had MRI done at that time.  CT scan today without any concerning abnormalities.  Given that the symptoms have apparently remained stable I do not feel repeat MRI is necessary at this time.  It does appear he has perhaps a left medial rectus palsy.  At this time think would be reasonable for patient to follow-up with ophthalmology.  Did discuss this with the patient.  Additionally he was requesting refill of his meclizine so we will refill that.   FINAL CLINICAL IMPRESSION(S) / ED  DIAGNOSES   Final diagnoses:  Change in vision    Note:  This document was prepared using Dragon voice recognition software and may include unintentional dictation errors.    Nance Pear, MD 05/15/22 [phone]

## 2022-05-15 NOTE — Discharge Instructions (Addendum)
Please seek medical attention for any high fevers, chest pain, shortness of breath, change in behavior, persistent vomiting, bloody stool or any other new or concerning symptoms.  

## 2022-05-15 NOTE — ED Triage Notes (Signed)
Pt to ED via POV from home. Pt reports right eye pain. Pt was seen on 1/29 for right eye pain and dizziness. Pt had a stroke workup that was negative. Pt was sent to PCP and PCP gave him meclizine. PCP also removed a ball out of his ear on Monday. Pt reports symptoms did not get better after wax removal.

## 2022-05-16 LAB — PSA: Prostate Specific Ag, Serum: <0.1 ng/mL (ref 0.0–4.0)

## 2022-05-19 ENCOUNTER — Ambulatory Visit: Payer: Medicare Other | Admitting: Urology

## 2022-05-20 ENCOUNTER — Encounter: Payer: Self-pay | Admitting: Urology

## 2022-06-11 NOTE — Progress Notes (Signed)
Covid test ordered due to pt dizziness

## 2022-06-24 ENCOUNTER — Ambulatory Visit (INDEPENDENT_AMBULATORY_CARE_PROVIDER_SITE_OTHER): Payer: 59 | Admitting: Urology

## 2022-06-24 ENCOUNTER — Encounter: Payer: Self-pay | Admitting: Urology

## 2022-06-24 VITALS — BP 126/77 | HR 84 | Ht 68.0 in | Wt 187.0 lb

## 2022-06-24 DIAGNOSIS — N5235 Erectile dysfunction following radiation therapy: Secondary | ICD-10-CM

## 2022-06-24 DIAGNOSIS — Z8546 Personal history of malignant neoplasm of prostate: Secondary | ICD-10-CM

## 2022-06-24 DIAGNOSIS — C61 Malignant neoplasm of prostate: Secondary | ICD-10-CM

## 2022-06-24 DIAGNOSIS — R232 Flushing: Secondary | ICD-10-CM

## 2022-06-24 MED ORDER — SILDENAFIL CITRATE 100 MG PO TABS: 100.0000 mg | ORAL_TABLET | Freq: Every day | ORAL | 0 refills | Status: DC | PRN

## 2022-06-24 NOTE — Progress Notes (Signed)
Haze Rushing Plume,acting as a scribe for Hollice Espy, MD.,have documented all relevant documentation on the behalf of Hollice Espy, MD,as directed by  Hollice Espy, MD while in the presence of Hollice Espy, MD.  06/24/2022 10:14 AM   Edward Weber 26-Feb-1961 [id number]  Referring provider: [name], MD [place],  [place] [address]  Chief Complaint  Patient presents with   Prostate Cancer    HPI: 62 y.o.male with a personal history of high risk prostate cancer and ED following radiation who presents today 6 month follow-up.  Notably, his PSA rose to 13.3 to in 12/2019 at which time he underwent prostate biopsy indicating high risk prostate cancer.  He underwent a prostate biopsy on 02/07/2020. Pathology revealed high volume Gleason score (4+3, 3+4, 3+5) in 11/12 cores.   CT scan and bone scan were negative for metastatic disease.  He ultimately like to undergo IMRT with ADT.  He has final treatment of radiation in 05/2020.  His most recent PSA on 05/15/2022 was unremarkable.   Today, he reports continued issues with hot flashes. He notes an occasional "woozy" sensation during these. He has been using tadalafil for erectile dysfunction. He reports that tadalafil has not been effective and would like to discuss other options.   PMH: Past Medical History:  Diagnosis Date   Alcoholism ([place])    Allergic rhinitis    Allergic state    Anemia    Arthritis    Cancer (HCC)    Chronic hepatitis C (HCC)    Depression    Esophagitis, reflux    GERD (gastroesophageal reflux disease)    Helicobacter pylori gastritis    Hepatitis    Hypertension    Neuromuscular disorder (HCC)    Osteoarthrosis    Peripheral neuropathy    Rhabdomyolysis    Sickle cell anemia (HCC)    Stroke (HCC)    Stroke [facility])    Vitiligo     Surgical History: Past Surgical History:  Procedure Laterality Date   COLON SURGERY     COLONOSCOPY WITH PROPOFOL N/A 05/09/2015    Procedure: COLONOSCOPY WITH PROPOFOL;  Surgeon: Hulen Luster, MD;  Location[facility]COPY;  Service: Gastroenterology;  Laterality: N/A;   ESOPHAGOGASTRODUODENOSCOPY N/A 05/09/2015   Procedure: ESOPHAGOGASTRODUODENOSCOPY (EGD);  Surgeon: Hulen Luster, MD;  Location: [facility] ENDOSCOPY;  Service: Gastroenterology;  Laterality: N/A;   ESOPHAGOGASTRODUODENOSCOPY (EGD) WITH PROPOFOL N/A 05/04/2017   Procedure: ESOPHAGOGASTRODUODENOSCOPY (EGD) WITH PROPOFOL;  Surgeon: Toledo, Benay Pike, MD;  Location: [facility] ENDOSCOPY;  Service: Gastroenterology;  Laterality: N/A;   FINGER DEBRIDEMENT  1976   s/p infected dog bite   TEE WITHOUT CARDIOVERSION N/A 05/20/2012   Procedure: TRANSESOPHAGEAL ECHOCARDIOGRAM (TEE);  Surgeon: Fay Records, MD;  Location[facility] ENDOSCOPY;  Service: Cardiovascular;  Laterality: N/A;    Home Medications:  Allergies as of 06/24/2022       Reactions   Bee Venom Anaphylaxis   Beef-derived Products    Personal preference   Lactalbumin    Milk-related Compounds Nausea And Vomiting   Intoloerance   Pegademase Bovine    Other reaction(s): Unknown Personal preference Personal preference Personal preference   Tilactase Nausea And Vomiting   Intoloerance        Medication List        Accurate as of June 24, 2022 10:14 AM. If you have any questions, ask your nurse or doctor.          albuterol 108 (90 Base) MCG/ACT inhaler  Commonly known as: VENTOLIN HFA Inhale 2 puffs into the lungs daily as needed for wheezing or shortness of breath.   allopurinol 100 MG tablet Commonly known as: ZYLOPRIM Take 100 mg by mouth 2 (two) times daily.   amLODipine 5 MG tablet Commonly known as: NORVASC Take 5 mg by mouth daily.   colchicine 0.6 MG tablet Take 0.6 mg by mouth 2 (two) times daily as needed (gout flare).   escitalopram 20 MG tablet Commonly known as: LEXAPRO Take 20 mg by mouth daily.   gabapentin 100 MG capsule Commonly known as: NEURONTIN Take 100 mg by mouth 3 (three)  times daily.   lisinopril-hydrochlorothiazide 20-12.5 MG tablet Commonly known as: ZESTORETIC Take 2 tablets by mouth daily.   pantoprazole 40 MG tablet Commonly known as: PROTONIX Take 40 mg by mouth daily.        Allergies:  Allergies  Allergen Reactions   Bee Venom Anaphylaxis   Beef-Derived Products     Personal preference   Lactalbumin    Milk-Related Compounds Nausea And Vomiting    Intoloerance   Pegademase Bovine     Other reaction(s): Unknown Personal preference Personal preference Personal preference    Tilactase Nausea And Vomiting    Intoloerance    Social History:  reports that he has been smoking cigarettes. He has never used smokeless tobacco. He reports current alcohol use of about 9.0 standard drinks of alcohol per week. He reports current drug use. Drugs: Marijuana and Cocaine.   Physical Exam: BP 126/77   Pulse 84   Ht '5\' 8"'$  (1.727 m)   Wt 187 lb (84.8 kg)   BMI 28.43 kg/m   Constitutional:  Alert and oriented, No acute distress. HEENT: [place] AT, moist mucus membranes.  Trachea midline, no masses. Neurologic: Grossly intact, no focal deficits, moving all 4 extremities. Psychiatric: Normal mood and affect.  Assessment & Plan:    1. Prostate cancer - No further ADT at this time as he has completed 2.5 years.  - PSA remains undetectable.  - Continue acute 6 month PSA checks.   2. Erectile dysfunction - As tadalafil was not effective, we will switch to sildenafil 100 mg.  - We discussed the pathophysiology of erectile dysfunction today along with possible contributing factors. Discussed possible treatment options including PDE 5 inhibitors, vacuum erectile device, intracavernosal injection, MUSE, and placement of the inflatable or malleable penile prosthesis for refractory cases.  In terms of PDE 5 inhibitors, we discussed contraindications for this medication as well as common side effects. Patient was counseled on optimal use. All of his  questions were answered in detail.  3. Hot flashes -side effect of his Eligard injections. As his last injection was 6 months ago, he should be able to expect a decrease in his symptoms. They should improve with time. - List of recommended supplements was provided to him regarding his hot flashes again today.    Return in about 6 months (around 12/25/2022) for repeat PSA. Another office visit in one year. .  I have reviewed the above documentation for accuracy and completeness, and I agree with the above.   Hollice Espy, MD   [facility] [address], [place], [place] [address] [phone]

## 2022-06-24 NOTE — Patient Instructions (Signed)
Plant or soy estrogens (although it may be more helpful to add these foods to your diet than to take supplements) Black cohosh Evening primrose oil Ginseng Dong quai Wild yam Vitamin E

## 2022-09-17 ENCOUNTER — Inpatient Hospital Stay
Admission: EM | Admit: 2022-09-17 | Discharge: 2022-09-20 | DRG: 557 | Disposition: A | Discharge status: Home / Self Care | Payer: 59 | Attending: Internal Medicine | Admitting: Internal Medicine

## 2022-09-17 ENCOUNTER — Emergency Department: Payer: 59

## 2022-09-17 ENCOUNTER — Other Ambulatory Visit: Payer: Self-pay

## 2022-09-17 DIAGNOSIS — Z87892 Personal history of anaphylaxis: Secondary | ICD-10-CM

## 2022-09-17 DIAGNOSIS — E512 Wernicke's encephalopathy: Secondary | ICD-10-CM | POA: Diagnosis present

## 2022-09-17 DIAGNOSIS — G9341 Metabolic encephalopathy: Secondary | ICD-10-CM | POA: Diagnosis present

## 2022-09-17 DIAGNOSIS — I129 Hypertensive chronic kidney disease with stage 1 through stage 4 chronic kidney disease, or unspecified chronic kidney disease: Secondary | ICD-10-CM | POA: Diagnosis present

## 2022-09-17 DIAGNOSIS — Z8673 Personal history of transient ischemic attack (TIA), and cerebral infarction without residual deficits: Secondary | ICD-10-CM

## 2022-09-17 DIAGNOSIS — R296 Repeated falls: Secondary | ICD-10-CM | POA: Diagnosis present

## 2022-09-17 DIAGNOSIS — Z91030 Bee allergy status: Secondary | ICD-10-CM

## 2022-09-17 DIAGNOSIS — F17210 Nicotine dependence, cigarettes, uncomplicated: Secondary | ICD-10-CM | POA: Diagnosis present

## 2022-09-17 DIAGNOSIS — C61 Malignant neoplasm of prostate: Secondary | ICD-10-CM | POA: Diagnosis present

## 2022-09-17 DIAGNOSIS — N1831 Chronic kidney disease, stage 3a: Secondary | ICD-10-CM | POA: Diagnosis present

## 2022-09-17 DIAGNOSIS — Z91011 Allergy to milk products: Secondary | ICD-10-CM

## 2022-09-17 DIAGNOSIS — B182 Chronic viral hepatitis C: Secondary | ICD-10-CM | POA: Diagnosis present

## 2022-09-17 DIAGNOSIS — M6282 Rhabdomyolysis: Principal | ICD-10-CM | POA: Diagnosis present

## 2022-09-17 DIAGNOSIS — I10 Essential (primary) hypertension: Secondary | ICD-10-CM | POA: Diagnosis present

## 2022-09-17 DIAGNOSIS — E872 Acidosis, unspecified: Secondary | ICD-10-CM | POA: Diagnosis present

## 2022-09-17 DIAGNOSIS — K219 Gastro-esophageal reflux disease without esophagitis: Secondary | ICD-10-CM | POA: Diagnosis present

## 2022-09-17 DIAGNOSIS — N179 Acute kidney failure, unspecified: Secondary | ICD-10-CM | POA: Diagnosis present

## 2022-09-17 DIAGNOSIS — M109 Gout, unspecified: Secondary | ICD-10-CM | POA: Diagnosis present

## 2022-09-17 DIAGNOSIS — Z79899 Other long term (current) drug therapy: Secondary | ICD-10-CM

## 2022-09-17 DIAGNOSIS — W19XXXA Unspecified fall, initial encounter: Secondary | ICD-10-CM | POA: Diagnosis present

## 2022-09-17 DIAGNOSIS — I351 Nonrheumatic aortic (valve) insufficiency: Secondary | ICD-10-CM | POA: Diagnosis present

## 2022-09-17 DIAGNOSIS — Z91014 Allergy to mammalian meats: Secondary | ICD-10-CM

## 2022-09-17 DIAGNOSIS — Z888 Allergy status to other drugs, medicaments and biological substances status: Secondary | ICD-10-CM

## 2022-09-17 DIAGNOSIS — F0390 Unspecified dementia without behavioral disturbance: Secondary | ICD-10-CM | POA: Diagnosis present

## 2022-09-17 DIAGNOSIS — M79601 Pain in right arm: Secondary | ICD-10-CM | POA: Diagnosis present

## 2022-09-17 DIAGNOSIS — F1020 Alcohol dependence, uncomplicated: Secondary | ICD-10-CM | POA: Diagnosis present

## 2022-09-17 LAB — URINALYSIS, ROUTINE W REFLEX MICROSCOPIC
Bilirubin Urine: NEGATIVE
Glucose, UA: NEGATIVE mg/dL
Hgb urine dipstick: NEGATIVE
Ketones, ur: NEGATIVE mg/dL
Leukocytes,Ua: NEGATIVE
Nitrite: NEGATIVE
Protein, ur: 30 mg/dL — AB
Specific Gravity, Urine: 1.017 (ref 1.005–1.030)
pH: 5 (ref 5.0–8.0)

## 2022-09-17 LAB — COMPREHENSIVE METABOLIC PANEL
ALT: 29 U/L (ref 0–44)
AST: 41 U/L (ref 15–41)
Albumin: 5 g/dL (ref 3.5–5.0)
Alkaline Phosphatase: 60 U/L (ref 38–126)
Anion gap: 16 — ABNORMAL HIGH (ref 5–15)
BUN: 47 mg/dL — ABNORMAL HIGH (ref 8–23)
CO2: 15 mmol/L — ABNORMAL LOW (ref 22–32)
Calcium: 9.9 mg/dL (ref 8.9–10.3)
Chloride: 106 mmol/L (ref 98–111)
Creatinine, Ser: 3.03 mg/dL — ABNORMAL HIGH (ref 0.61–1.24)
GFR, Estimated: 22 mL/min — ABNORMAL LOW (ref >60)
Glucose, Bld: 162 mg/dL — ABNORMAL HIGH (ref 70–99)
Potassium: 3.6 mmol/L (ref 3.5–5.1)
Sodium: 137 mmol/L (ref 135–145)
Total Bilirubin: 1 mg/dL (ref 0.3–1.2)
Total Protein: 8.9 g/dL — ABNORMAL HIGH (ref 6.5–8.1)

## 2022-09-17 LAB — CBC
HCT: 40.5 % (ref 39.0–52.0)
Hemoglobin: 13.6 g/dL (ref 13.0–17.0)
MCH: 31.4 pg (ref 26.0–34.0)
MCHC: 33.6 g/dL (ref 30.0–36.0)
MCV: 93.5 fL (ref 80.0–100.0)
Platelets: 306 10*3/uL (ref 150–400)
RBC: 4.33 MIL/uL (ref 4.22–5.81)
RDW: 13.9 % (ref 11.5–15.5)
WBC: 7.8 10*3/uL (ref 4.0–10.5)
nRBC: 0 % (ref 0.0–0.2)

## 2022-09-17 LAB — TROPONIN I (HIGH SENSITIVITY): Troponin I (High Sensitivity): 12 ng/L (ref <18)

## 2022-09-17 LAB — LIPASE, BLOOD: Lipase: 39 U/L (ref 11–51)

## 2022-09-17 LAB — URIC ACID: Uric Acid, Serum: 8.9 mg/dL — ABNORMAL HIGH (ref 3.7–8.6)

## 2022-09-17 LAB — CK: Total CK: 777 U/L — ABNORMAL HIGH (ref 49–397)

## 2022-09-17 MED ORDER — FENTANYL CITRATE PF 50 MCG/ML IJ SOSY
50.0000 ug | PREFILLED_SYRINGE | Freq: Once | INTRAMUSCULAR | Status: AC
  Administered 2022-09-17: 50 ug via INTRAVENOUS
  Filled 2022-09-17: qty 1

## 2022-09-17 MED ORDER — LACTATED RINGERS IV BOLUS
1000.0000 mL | Freq: Once | INTRAVENOUS | Status: AC
  Administered 2022-09-17: 1000 mL via INTRAVENOUS

## 2022-09-17 MED ORDER — ONDANSETRON HCL 4 MG/2ML IJ SOLN
4.0000 mg | Freq: Once | INTRAMUSCULAR | Status: AC
  Administered 2022-09-17: 4 mg via INTRAVENOUS
  Filled 2022-09-17: qty 2

## 2022-09-17 MED ORDER — SODIUM CHLORIDE 0.9 % IV BOLUS
1000.0000 mL | Freq: Once | INTRAVENOUS | Status: AC
  Administered 2022-09-17: 1000 mL via INTRAVENOUS

## 2022-09-17 NOTE — Discharge Instructions (Addendum)
                  Intensive Outpatient Programs  [facility]    [facility] [address]     [address] [place],  [place]     [place], [place] [phone]      [phone]  [facility]   [facility]  (Inpatient and outpatient)  [phone] (Suboxone and Methadone) [address]           [phone]           [facility]    [facility] - Intensive Outpatient [address]     [address] [place], [place] [address]     [place], [place]  [phone]      [phone]  [facility] (Outpatient, Inpatient, Chemical  [facility] (Groups and [facility]idental) (insurance only) [phone]    [place], [place]          [phone]       [facility]    [facility] (for caregivers and family) [address]     [address] [place], [place]     [place], [place] [phone]      [phone]  Residential Treatment Programs  [facility]  [facility](2 years) Residential: 90 days)  [facility] [address]      [address] [place], [place]     [place], [place] [phone]      [phone] or [phone]  [facility]    [facility] [address]      [address] [place], [place]     [place], [place] [phone]      [phone]  [facility]   [facility] ([facility] [address]     [address] [place], [place] [address]     [place], [place] [phone]      [phone] Admissions: 8am-3pm M-F  [facility]: Residential Program (90 Days)              [facility]: [facility]  [place], [place]     [place], [place]  [phone] or [phone]    (Walk in Hours over the weekend or by referral)   [place] [facility]: [facility]:[phone] (for crisis  response 24 hours a day) 

## 2022-09-17 NOTE — ED Provider Notes (Signed)
[facility] Provider Note    Event Date/Time   First MD Initiated Contact with Patient 09/17/22 1906     (approximate)   History   Knee Pain and Emesis   HPI  Edward Weber is a 62 y.o. male with history of hypertension, CVA, cytotoxic brain edema, gout and as listed in EMR presents to the emergency department for treatment and evaluation of gout in right knee and right great toe since Tuesday. No injury. He feels that the pain is causing him to vomit. He denies chest pain, shortness of breath, fever or diarrhea. Wife also states that he has been having hot flashes and has had trouble getting to the bathroom in time before urinating. He denies dysuria. She says he has had little energy or appetite and has spent most of the past 2 days in bed.    Physical Exam   Triage Vital Signs: ED Triage Vitals  Enc Vitals Group     BP 09/17/22 1811 108/75     Pulse Rate 09/17/22 1811 60     Resp 09/17/22 1811 18     Temp 09/17/22 1811 98 F (36.7 C)     Temp Source 09/17/22 1811 Oral     SpO2 09/17/22 1811 94 %     Weight 09/17/22 1812 185 lb 3 oz (84 kg)     Height --      Head Circumference --      Peak Flow --      Pain Score 09/17/22 1812 5     Pain Loc --      Pain Edu? --      Excl. in [place]? --     Most recent vital signs: Vitals:   09/17/22 2133 09/17/22 2306  BP: 110/80   Pulse: 62 66  Resp: 18   Temp: 98 F (36.7 C) 98 F (36.7 C)  SpO2: 96%     General: Awake, no distress.  CV:  Good peripheral perfusion.  Resp:  Normal effort. Breath sounds are clear. Abd:  No distention. Soft Other:  Joints of right lower extremity are of normal color and without edema. No weakness in extremities. Active range of motion in extremities.   ED Results / Procedures / Treatments   Labs (all labs ordered are listed, but only abnormal results are displayed) Labs Reviewed  COMPREHENSIVE METABOLIC PANEL - Abnormal; Notable for the following components:       Result Value   CO2 15 (*)    Glucose, Bld 162 (*)    BUN 47 (*)    Creatinine, Ser 3.03 (*)    Total Protein 8.9 (*)    GFR, Estimated 22 (*)    Anion gap 16 (*)    All other components within normal limits  URIC ACID - Abnormal; Notable for the following components:   Uric Acid, Serum 8.9 (*)    All other components within normal limits  CK - Abnormal; Notable for the following components:   Total CK 777 (*)    All other components within normal limits  URINALYSIS, ROUTINE W REFLEX MICROSCOPIC - Abnormal; Notable for the following components:   Color, Urine YELLOW (*)    APPearance CLOUDY (*)    Protein, ur 30 (*)    Bacteria, UA RARE (*)    All other components within normal limits  LIPASE, BLOOD  CBC  LACTIC ACID, PLASMA  LACTIC ACID, PLASMA  TROPONIN I (HIGH SENSITIVITY)  TROPONIN I (HIGH SENSITIVITY)  EKG    RADIOLOGY  Image and radiology report reviewed and interpreted by me. Radiology report consistent with the same.  Chest x-ray negative for acute concerns.  PROCEDURES:  Critical Care performed: No  Procedures   MEDICATIONS ORDERED IN ED:  Medications  sodium chloride 0.9 % bolus 1,000 mL (0 mLs Intravenous Stopped 09/17/22 2100)  ondansetron (ZOFRAN) injection 4 mg (4 mg Intravenous Given 09/17/22 1940)  fentaNYL (SUBLIMAZE) injection 50 mcg (50 mcg Intravenous Given 09/17/22 1940)  lactated ringers bolus 1,000 mL (1,000 mLs Intravenous New Bag/Given 09/17/22 2132)     IMPRESSION / MDM / ASSESSMENT AND PLAN / ED COURSE   I have reviewed the triage note.  Differential diagnosis includes, but is not limited to, gout, viral syndrome, acute cystitis, colitis,   Patient's presentation is most consistent with acute illness / injury with system symptoms.  62 year old male presenting to the emergency department for treatment and evaluation of joint pain and 3 episodes of vomiting.  Symptoms started 2 days ago. Wife also reports he has been in bed  most of the last 2 days. Patient states that he just hurts all over.  On exam, right knee and great toe are not red, warm, or swollen.  He is able to perform active range of motion.  Plan will be to get some labs and urinalysis as I do not think that gout is the reason for his pain or the reason for his decreased energy and appetite for the past 2 days.  Labs show normal CBC.  CMP is concerning for BUN of 47 and a creatinine of 3.03 with a GFR of 22.  Review of previous records show he had a normal BUN and creatinine 4 months ago and has no history of renal failure.  IV fluids are infusing.  Troponin is normal.  Uric acid level is elevated at 8.9.  Lipase is normal.  Awaiting urinalysis, however patient will likely require admission for acute kidney injury.  This was discussed with the patient and his wife.  They are agreeable to the plan.  Urinalysis shows rare bacteria, protein but is otherwise unremarkable. Patient denies any dysuria.  Case was discussed with ED attending, Dr. Augusto Gamble who agrees with plan for admission.  Hospitalist consult requested.  ----------------------------------------- 11:12 PM on 09/17/2022 -----------------------------------------  Dr. Allena Katz has accepted patient for admission.      FINAL CLINICAL IMPRESSION(S) / ED DIAGNOSES   Final diagnoses:  AKI (acute kidney injury) (HCC)     Rx / DC Orders   ED Discharge Orders     None        Note:  This document was prepared using Dragon voice recognition software and may include unintentional dictation errors.   Chinita Pester, FNP 09/17/22 2314    Minna Antis, MD 09/18/22 2306

## 2022-09-17 NOTE — H&P (Signed)
History and Physical    Patient: Edward Weber:[id number] DOB: 20-Mar-1961 DOA: 09/17/2022 DOS: the patient was seen and examined on 09/18/2022 PCP: Gracelyn Nurse, MD  Patient coming from: Home  Chief Complaint:  Chief Complaint  Patient presents with   Knee Pain   Emesis    HPI: Edward Weber is a 62 y.o. male with medical history significant for alcoholism with his last drink at 3 3 PM today states he does not have any withdrawal or shakes if he does not drink, coming for right arm pain.  Per ED provider when wife was at bedside she reported that he has been having some nausea and vomiting and hot flashes and difficulty with urination.  Patient is a very limited historian and suspect secondary to underlying dementia.  No reports of falls or trauma.  No reports of fevers or chills no headaches or blurred vision by patient.  Called home to speak to wife and no answer.  Chart review shows that patient high-grade prostate cancer and is followed by urology.  Although today's presentation is unclear and patient is a limited historian due to his history of Warnicke's and possibly dementia.  Suspect that patient may have had a fall at home because of else alcohol.  From wife's report it is clear that the patient has not been drinking or eating. In the emergency room patient is alert and awake and is oriented to self intermittently when answering questions he perseverates and does not know the answers.  Patient is being followed at [facility] for his Wernicke's syndrome.  Patient had a head CTA and February of this year which showed small lacunar infarcts in the right thalamus in the bilateral basal ganglia on, patient has also had right frontoparietal craniotomy defect. Blood pressure 110/80, pulse 66, temperature 98 F (36.7 C), temperature source Oral, resp. rate 18, weight 84 kg, SpO2 96 %. In the emergency room vitals are stable patient is afebrile, cooperative. CMP shows glucose of 162 bicarb  of 15 AKI with a creatinine of 3.03 and EGFR of 22, anion gap of 16 uric acid of 8.9 normal AST ALT. CPK of 777.  Troponin of 12 and 14. Lactic acidosis of 2.6.  CBC is within normal limits.  Viral panel negative for flu COVID and RSV.  Urinalysis done in the emergency room is cloudy with yellow urine.  EKG is ordered.  Chest x-ray negative for any acute findings.  Alcohol level is ordered and pending.  Head CT noncontrast was ordered and pending patient is at high risk of fall and bleed. 2d echo 2023: 1. Left ventricular ejection fraction, by estimation, is 60 to 65% . The left ventricle has normal function. The left ventricle has no regional wall motion abnormalities. There is moderate concentric left ventricular hypertrophy. Left ventricular diastolic parameters are consistent with Grade I diastolic dysfunction ( impaired relaxation) . 2. Right ventricular systolic function is normal. The right ventricular size is normal. 3. Left atrial size was mildly dilated. 4. The mitral valve is normal in structure. Trivial mitral valve regurgitation. No evidence of mitral stenosis. 5. The aortic valve is normal in structure. Aortic valve regurgitation is mild. No aortic stenosis is present. 6. The inferior vena cava is normal in size with greater than 50% respiratory variability, suggesting right atrial pressure of 3 mmHg.  Review of Systems  Constitutional:  Positive for chills, diaphoresis and fever.  HENT: Negative.    Eyes: Negative.   Respiratory: Negative.  Cardiovascular: Negative.   Gastrointestinal: Negative.   Neurological:  Positive for weakness.  Psychiatric/Behavioral: Negative.     Past Medical History:  Diagnosis Date   Alcoholism (HCC)    Allergic rhinitis    Allergic state    Anemia    Arthritis    Cancer (HCC)    Chronic hepatitis C (HCC)    Depression    Esophagitis, reflux    GERD (gastroesophageal reflux disease)    Helicobacter pylori gastritis    Hepatitis    Hypertension     Neuromuscular disorder (HCC)    Osteoarthrosis    Peripheral neuropathy    Rhabdomyolysis    Sickle cell anemia (HCC)    Stroke (HCC)    Stroke [facility])    Vitiligo    Past Surgical History:  Procedure Laterality Date   COLON SURGERY     COLONOSCOPY WITH PROPOFOL N/A 05/09/2015   Procedure: COLONOSCOPY WITH PROPOFOL;  Surgeon: Wallace Cullens, MD;  Location: [facility] ENDOSCOPY;  Service: Gastroenterology;  Laterality: N/A;   ESOPHAGOGASTRODUODENOSCOPY N/A 05/09/2015   Procedure: ESOPHAGOGASTRODUODENOSCOPY (EGD);  Surgeon: Wallace Cullens, MD;  Location: [facility] ENDOSCOPY;  Service: Gastroenterology;  Laterality: N/A;   ESOPHAGOGASTRODUODENOSCOPY (EGD) WITH PROPOFOL N/A 05/04/2017   Procedure: ESOPHAGOGASTRODUODENOSCOPY (EGD) WITH PROPOFOL;  Surgeon: Toledo, Boykin Nearing, MD;  Location: [facility] ENDOSCOPY;  Service: Gastroenterology;  Laterality: N/A;   FINGER DEBRIDEMENT  1976   s/p infected dog bite   TEE WITHOUT CARDIOVERSION N/A 05/20/2012   Procedure: TRANSESOPHAGEAL ECHOCARDIOGRAM (TEE);  Surgeon: Pricilla Riffle, MD;  Location: [facility] ENDOSCOPY;  Service: Cardiovascular;  Laterality: N/A;   Social History:  reports that he has been smoking cigarettes. He has never used smokeless tobacco. He reports current alcohol use of about 9.0 standard drinks of alcohol per week. He reports current drug use. Drugs: Marijuana and Cocaine.  Allergies  Allergen Reactions   Bee Venom Anaphylaxis   Beef-Derived Products     Personal preference   Lactalbumin    Milk-Related Compounds Nausea And Vomiting    Intoloerance   Pegademase Bovine     Other reaction(s): Unknown Personal preference Personal preference Personal preference    Tilactase Nausea And Vomiting    Intoloerance    No family history on file.  Prior to Admission medications   Medication Sig Start Date End Date Taking? Authorizing Provider  albuterol (PROVENTIL HFA;VENTOLIN HFA) 108 (90 Base) MCG/ACT inhaler Inhale 2 puffs into the lungs daily as needed for  wheezing or shortness of breath.    [provider]  allopurinol (ZYLOPRIM) 100 MG tablet Take 100 mg by mouth 2 (two) times daily.    [provider]  amLODipine (NORVASC) 5 MG tablet Take 5 mg by mouth daily.    [provider]  colchicine 0.6 MG tablet Take 0.6 mg by mouth 2 (two) times daily as needed (gout flare).    [provider]  escitalopram (LEXAPRO) 20 MG tablet Take 20 mg by mouth daily.    [provider]  gabapentin (NEURONTIN) 100 MG capsule Take 100 mg by mouth 3 (three) times daily.    [provider]  lisinopril-hydrochlorothiazide (ZESTORETIC) 20-12.5 MG tablet Take 2 tablets by mouth daily.    [provider]  pantoprazole (PROTONIX) 40 MG tablet Take 40 mg by mouth daily.    [provider]  sildenafil (VIAGRA) 100 MG tablet Take 1 tablet (100 mg total) by mouth daily as needed for erectile dysfunction. 06/24/22   Vanna Scotland, MD  Physical Exam: Vitals:   09/17/22 1811 09/17/22 1812 09/17/22 2133 09/17/22 2306  BP: 108/75  110/80   Pulse: 60  62 66  Resp: 18  18   Temp: 98 F (36.7 C)  98 F (36.7 C) 98 F (36.7 C)  TempSrc: Oral   Oral  SpO2: 94%  96%   Weight:  84 kg     Physical Exam Vitals reviewed.  Constitutional:      Appearance: He is not ill-appearing.  HENT:     Head: Normocephalic and atraumatic.     Right Ear: External ear normal.     Left Ear: External ear normal.  Eyes:     Extraocular Movements: Extraocular movements intact.     Pupils: Pupils are equal, round, and reactive to light.  Cardiovascular:     Rate and Rhythm: Normal rate and regular rhythm.     Pulses: Normal pulses.     Heart sounds: Normal heart sounds.  Pulmonary:     Effort: Pulmonary effort is normal.     Breath sounds: Normal breath sounds.  Abdominal:     General: Bowel sounds are normal.     Palpations: Abdomen is soft.  Musculoskeletal:        General: Normal range of motion.  Skin:     General: Skin is warm.  Neurological:     General: No focal deficit present.     Mental Status: He is alert. He is disoriented.  Psychiatric:        Mood and Affect: Mood normal.        Behavior: Behavior normal.   Labs on Admission: I have personally reviewed following labs and imaging studies  CBC: Recent Labs  Lab 09/17/22 1818  WBC 7.8  HGB 13.6  HCT 40.5  MCV 93.5  PLT 306   Basic Metabolic Panel: Recent Labs  Lab 09/17/22 1818  NA 137  K 3.6  CL 106  CO2 15*  GLUCOSE 162*  BUN 47*  CREATININE 3.03*  CALCIUM 9.9   GFR: Estimated Creatinine Clearance: 26 mL/min (A) (by C-G formula based on SCr of 3.03 mg/dL (H)). Liver Function Tests: Recent Labs  Lab 09/17/22 1818  AST 41  ALT 29  ALKPHOS 60  BILITOT 1.0  PROT 8.9*  ALBUMIN 5.0   Recent Labs  Lab 09/17/22 1818  LIPASE 39   No results for input(s): "AMMONIA" in the last 168 hours. Coagulation Profile: Recent Labs  Lab 09/18/22 0051  INR 1.3*   Cardiac Enzymes: Recent Labs  Lab 09/17/22 1818  CKTOTAL 777*   Urine analysis:    Component Value Date/Time   COLORURINE YELLOW (A) 09/17/2022 2214   APPEARANCEUR CLOUDY (A) 09/17/2022 2214   APPEARANCEUR Clear 01/17/2020 1550   LABSPEC 1.017 09/17/2022 2214   LABSPEC 1.010 04/01/2014 1721   PHURINE 5.0 09/17/2022 2214   GLUCOSEU NEGATIVE 09/17/2022 2214   GLUCOSEU Negative 04/01/2014 1721   HGBUR NEGATIVE 09/17/2022 2214   BILIRUBINUR NEGATIVE 09/17/2022 2214   BILIRUBINUR Negative 01/17/2020 1550   BILIRUBINUR Negative 04/01/2014 1721   KETONESUR NEGATIVE 09/17/2022 2214   PROTEINUR 30 (A) 09/17/2022 2214   NITRITE NEGATIVE 09/17/2022 2214   LEUKOCYTESUR NEGATIVE 09/17/2022 2214   LEUKOCYTESUR Negative 04/01/2014 1721   Radiological Exams on Admission: CT HEAD WO CONTRAST ( )  Result Date: 09/18/2022 CLINICAL DATA:  Nausea and vomiting EXAM: CT HEAD WITHOUT CONTRAST TECHNIQUE: Contiguous axial images were obtained from the base of  the skull through the vertex without intravenous contrast.  RADIATION DOSE REDUCTION: This exam was performed according to the departmental dose-optimization program which includes automated exposure control, adjustment of the mA and/or kV according to patient size and/or use of iterative reconstruction technique. COMPARISON:  None Available. FINDINGS: Brain: No evidence of acute infarction, hemorrhage, hydrocephalus, extra-axial collection or mass lesion/mass effect. Chronic white matter ischemic changes are noted. Scattered lacunar infarcts are again noted in the right thalamus and basal ganglia bilaterally. Vascular: No hyperdense vessel or unexpected calcification. Skull: Changes consistent with right-sided parietal craniotomy. Sinuses/Orbits: No acute finding. Other: None. IMPRESSION: Chronic changes without acute abnormality. Electronically Signed   By: Alcide Clever M.D.   On: 09/18/2022 01:15   US RENAL  Result Date: 09/18/2022 CLINICAL DATA:  Acute renal injury EXAM: RENAL / URINARY TRACT ULTRASOUND COMPLETE COMPARISON:  02/16/2020 FINDINGS: Right Kidney: Renal measurements: 9.5 x 4.7 x 4.3 cm. = volume: 99 mL. Echogenicity within normal limits. No mass or hydronephrosis visualized. Left Kidney: Renal measurements: 9.7 x 6.3 x 5.3 cm. = volume: 172 mL. No mass lesion or hydronephrosis is noted. 8 mm stone is noted in the lower pole stable from prior CT examination Bladder: Appears normal for degree of bladder distention. Other: None. IMPRESSION: Lower pole left renal stone. No other focal abnormality is noted. Electronically Signed   By: Alcide Clever M.D.   On: 09/18/2022 00:21   DG Chest 1 View  Result Date: 09/17/2022 CLINICAL DATA:  Nausea and vomiting, initial encounter EXAM: PORTABLE CHEST 1 VIEW COMPARISON:  01/17/2022 FINDINGS: The heart size and mediastinal contours are within normal limits. Both lungs are clear. The visualized skeletal structures are unremarkable. IMPRESSION: No active disease.  Electronically Signed   By: Alcide Clever M.D.   On: 09/17/2022 20:18     Data Reviewed: Relevant notes from primary care and specialist visits, past discharge summaries as available in EHR, including Care Everywhere. Prior diagnostic testing as pertinent to current admission diagnoses Updated medications and problem lists for reconciliation ED course, including vitals, labs, imaging, treatment and response to treatment Triage notes, nursing and pharmacy notes and ED provider's notes Notable results as noted in HPI  Assessment and Plan: >>Confusion: Patient does have a baseline Warnicke's syndrome and unclear if this is worse however will obtain a head CT noncontrast to follow. Will start patient on thiamine and give a loading dose now. DVT prophylaxis with SCDs today.  Consider an MRI or EEG if mentation does not improve for return to baseline.  Free T4/TSH.  Ammonia level/A1c.  B12 and folate level.  EKG today is pending.  Troponin is negative.  PT/INR is pending.  Hemoglobin and platelet count is within normal limits.  >> Rhabdomyolysis: No reports of fall although patient could have had a mild fall.  It is difficult to discern if patient's had traumatic or nontraumatic rhabdo as patient is also on medications that may cause nontraumatic rhabdomyolysis.  Will currently hold patient's colchicine, continue with gentle IV fluid hydration overnight.  Hold any statin therapy.  >> Acute kidney injury: Bilateral renal ultrasound performed today negative for any obstruction. Lab Results  Component Value Date   CREATININE 3.03 (H) 09/17/2022   CREATININE 1.56 (H) 05/15/2022   CREATININE 1.01 05/11/2022  We will currently hold his amlodipine.  Will avoid contrast studies. Patient has received a total of 2 L of IV fluid in the emergency room. Will continue patient on LR at 75.  >> Anion gap metabolic acidosis: Attribute to patient's alcohol level, lactic acid level, acute  kidney injury,  rhabdomyolysis. Goal will be to treat the underlying cause.  Monitor electrolytes. Do not suspect patient will be needing any bicarb supplementation to correct his metabolic acidosis and anion gap. Will obtain an acetone level.  >> Essential hypertension: Vitals:   09/17/22 1811 09/17/22 2133  BP: 108/75 110/80  Blood pressures have been soft. Hold amlodipine.  >> GERD: IV PPI therapy.  >> Recurrent falls: Secondary to alcohol abuse, B12 deficiency and possibly neuropathy. Counseling, withdrawal precautions, CIWA, PT eval  prior to discharge.  >> History of intracerebral hemorrhage/history of stroke: Will obtain head CT today.  If negative will start DVT prophylaxis with heparin.   DVT prophylaxis: SCDs  Consults: None  Advance Care Planning:    Code Status: Full Code   Family Communication: None  Disposition Plan: Back to previous home environment  Severity of Illness: The appropriate patient status for this patient is INPATIENT. Inpatient status is judged to be reasonable and necessary in order to provide the required intensity of service to ensure the patient's safety. The patient's presenting symptoms, physical exam findings, and initial radiographic and laboratory data in the context of their chronic comorbidities is felt to place them at high risk for further clinical deterioration. Furthermore, it is not anticipated that the patient will be medically stable for discharge from the hospital within 2 midnights of admission.   * I certify that at the point of admission it is my clinical judgment that the patient will require inpatient hospital care spanning beyond 2 midnights from the point of admission due to high intensity of service, high risk for further deterioration and high frequency of surveillance required.*  Author: Gertha Calkin, MD 09/18/2022 1:19 AM  For on call review [email or web address].

## 2022-09-17 NOTE — ED Notes (Signed)
US at the bedside

## 2022-09-17 NOTE — ED Notes (Signed)
Pts significant other provided an update - consent obtained by pt prior to information being shared over the phone.

## 2022-09-17 NOTE — ED Triage Notes (Signed)
Pt complains of gout in right knee and right big toe, has been nauseated and vomited 3 times today. Denies fever denies diarrhea.

## 2022-09-18 ENCOUNTER — Encounter: Payer: Self-pay | Admitting: Internal Medicine

## 2022-09-18 ENCOUNTER — Observation Stay: Payer: 59

## 2022-09-18 DIAGNOSIS — C61 Malignant neoplasm of prostate: Secondary | ICD-10-CM | POA: Diagnosis present

## 2022-09-18 DIAGNOSIS — B182 Chronic viral hepatitis C: Secondary | ICD-10-CM | POA: Diagnosis present

## 2022-09-18 DIAGNOSIS — E872 Acidosis, unspecified: Secondary | ICD-10-CM | POA: Diagnosis present

## 2022-09-18 DIAGNOSIS — N1831 Chronic kidney disease, stage 3a: Secondary | ICD-10-CM | POA: Diagnosis present

## 2022-09-18 DIAGNOSIS — M79601 Pain in right arm: Secondary | ICD-10-CM | POA: Diagnosis present

## 2022-09-18 DIAGNOSIS — K219 Gastro-esophageal reflux disease without esophagitis: Secondary | ICD-10-CM | POA: Diagnosis present

## 2022-09-18 DIAGNOSIS — F1020 Alcohol dependence, uncomplicated: Secondary | ICD-10-CM | POA: Diagnosis present

## 2022-09-18 DIAGNOSIS — R296 Repeated falls: Secondary | ICD-10-CM | POA: Diagnosis present

## 2022-09-18 DIAGNOSIS — G9341 Metabolic encephalopathy: Secondary | ICD-10-CM | POA: Diagnosis present

## 2022-09-18 DIAGNOSIS — Z91011 Allergy to milk products: Secondary | ICD-10-CM | POA: Diagnosis not present

## 2022-09-18 DIAGNOSIS — Z91030 Bee allergy status: Secondary | ICD-10-CM | POA: Diagnosis not present

## 2022-09-18 DIAGNOSIS — W19XXXA Unspecified fall, initial encounter: Secondary | ICD-10-CM | POA: Diagnosis present

## 2022-09-18 DIAGNOSIS — I129 Hypertensive chronic kidney disease with stage 1 through stage 4 chronic kidney disease, or unspecified chronic kidney disease: Secondary | ICD-10-CM | POA: Diagnosis present

## 2022-09-18 DIAGNOSIS — E512 Wernicke's encephalopathy: Secondary | ICD-10-CM | POA: Diagnosis present

## 2022-09-18 DIAGNOSIS — Z87892 Personal history of anaphylaxis: Secondary | ICD-10-CM | POA: Diagnosis not present

## 2022-09-18 DIAGNOSIS — F0390 Unspecified dementia without behavioral disturbance: Secondary | ICD-10-CM | POA: Diagnosis present

## 2022-09-18 DIAGNOSIS — I351 Nonrheumatic aortic (valve) insufficiency: Secondary | ICD-10-CM | POA: Diagnosis present

## 2022-09-18 DIAGNOSIS — M6282 Rhabdomyolysis: Secondary | ICD-10-CM | POA: Diagnosis present

## 2022-09-18 DIAGNOSIS — M109 Gout, unspecified: Secondary | ICD-10-CM | POA: Diagnosis present

## 2022-09-18 DIAGNOSIS — N179 Acute kidney failure, unspecified: Secondary | ICD-10-CM | POA: Diagnosis present

## 2022-09-18 DIAGNOSIS — F17210 Nicotine dependence, cigarettes, uncomplicated: Secondary | ICD-10-CM | POA: Diagnosis present

## 2022-09-18 DIAGNOSIS — Z8673 Personal history of transient ischemic attack (TIA), and cerebral infarction without residual deficits: Secondary | ICD-10-CM | POA: Diagnosis not present

## 2022-09-18 DIAGNOSIS — Z79899 Other long term (current) drug therapy: Secondary | ICD-10-CM | POA: Diagnosis not present

## 2022-09-18 DIAGNOSIS — Z888 Allergy status to other drugs, medicaments and biological substances status: Secondary | ICD-10-CM | POA: Diagnosis not present

## 2022-09-18 DIAGNOSIS — Z91014 Allergy to mammalian meats: Secondary | ICD-10-CM | POA: Diagnosis not present

## 2022-09-18 LAB — COMPREHENSIVE METABOLIC PANEL
ALT: 22 U/L (ref 0–44)
AST: 30 U/L (ref 15–41)
Albumin: 4.1 g/dL (ref 3.5–5.0)
Alkaline Phosphatase: 45 U/L (ref 38–126)
Anion gap: 8 (ref 5–15)
BUN: 46 mg/dL — ABNORMAL HIGH (ref 8–23)
CO2: 18 mmol/L — ABNORMAL LOW (ref 22–32)
Calcium: 8.9 mg/dL (ref 8.9–10.3)
Chloride: 114 mmol/L — ABNORMAL HIGH (ref 98–111)
Creatinine, Ser: 2.46 mg/dL — ABNORMAL HIGH (ref 0.61–1.24)
GFR, Estimated: 29 mL/min — ABNORMAL LOW (ref >60)
Glucose, Bld: 118 mg/dL — ABNORMAL HIGH (ref 70–99)
Potassium: 3.7 mmol/L (ref 3.5–5.1)
Sodium: 140 mmol/L (ref 135–145)
Total Bilirubin: 0.9 mg/dL (ref 0.3–1.2)
Total Protein: 6.8 g/dL (ref 6.5–8.1)

## 2022-09-18 LAB — VITAMIN B12: Vitamin B-12: 185 pg/mL (ref 180–914)

## 2022-09-18 LAB — PROTIME-INR
INR: 1.3 — ABNORMAL HIGH (ref 0.8–1.2)
Prothrombin Time: 16.3 seconds — ABNORMAL HIGH (ref 11.4–15.2)

## 2022-09-18 LAB — CBC
HCT: 33.9 % — ABNORMAL LOW (ref 39.0–52.0)
Hemoglobin: 11.2 g/dL — ABNORMAL LOW (ref 13.0–17.0)
MCH: 31.4 pg (ref 26.0–34.0)
MCHC: 33 g/dL (ref 30.0–36.0)
MCV: 95 fL (ref 80.0–100.0)
Platelets: 228 10*3/uL (ref 150–400)
RBC: 3.57 MIL/uL — ABNORMAL LOW (ref 4.22–5.81)
RDW: 14.1 % (ref 11.5–15.5)
WBC: 7.7 10*3/uL (ref 4.0–10.5)
nRBC: 0 % (ref 0.0–0.2)

## 2022-09-18 LAB — ETHANOL: Alcohol, Ethyl (B): <10 mg/dL (ref <10)

## 2022-09-18 LAB — LACTIC ACID, PLASMA
Lactic Acid, Venous: 2.6 mmol/L (ref 0.5–1.9)
Lactic Acid, Venous: 1.6 mmol/L (ref 0.5–1.9)

## 2022-09-18 LAB — TROPONIN I (HIGH SENSITIVITY): Troponin I (High Sensitivity): 14 ng/L (ref <18)

## 2022-09-18 LAB — TSH: TSH: 0.802 u[IU]/mL (ref 0.350–4.500)

## 2022-09-18 LAB — T4, FREE: Free T4: 0.76 ng/dL (ref 0.61–1.12)

## 2022-09-18 LAB — FOLATE: Folate: 12.9 ng/mL (ref >5.9)

## 2022-09-18 LAB — AMMONIA: Ammonia: 24 umol/L (ref 9–35)

## 2022-09-18 MED ORDER — ACETAMINOPHEN 650 MG RE SUPP: 650.0000 mg | Freq: Four times a day (QID) | RECTAL | Status: DC | PRN

## 2022-09-18 MED ORDER — ESCITALOPRAM OXALATE 10 MG PO TABS
20.0000 mg | ORAL_TABLET | Freq: Every day | ORAL | Status: DC
  Administered 2022-09-18 – 2022-09-20 (×3): 20 mg via ORAL
  Filled 2022-09-18 (×3): qty 2

## 2022-09-18 MED ORDER — HEPARIN SODIUM (PORCINE) 5000 UNIT/ML IJ SOLN
5000.0000 [IU] | Freq: Three times a day (TID) | INTRAMUSCULAR | Status: DC
  Administered 2022-09-18 – 2022-09-20 (×5): 5000 [IU] via SUBCUTANEOUS
  Filled 2022-09-18 (×4): qty 1

## 2022-09-18 MED ORDER — PANTOPRAZOLE SODIUM 40 MG IV SOLR
40.0000 mg | Freq: Two times a day (BID) | INTRAVENOUS | Status: DC
  Administered 2022-09-18 – 2022-09-19 (×5): 40 mg via INTRAVENOUS
  Filled 2022-09-18 (×6): qty 10

## 2022-09-18 MED ORDER — SODIUM CHLORIDE 0.9% FLUSH
3.0000 mL | Freq: Two times a day (BID) | INTRAVENOUS | Status: DC
  Administered 2022-09-18 – 2022-09-19 (×4): 3 mL via INTRAVENOUS

## 2022-09-18 MED ORDER — LACTATED RINGERS IV SOLN: INTRAVENOUS | Status: AC

## 2022-09-18 MED ORDER — SODIUM CHLORIDE 0.9 % IV SOLN: INTRAVENOUS | Status: DC

## 2022-09-18 MED ORDER — PNEUMOCOCCAL VAC POLYVALENT 25 MCG/0.5ML IJ INJ
0.5000 mL | INJECTION | INTRAMUSCULAR | Status: DC
  Filled 2022-09-18: qty 0.5

## 2022-09-18 MED ORDER — ASPIRIN 81 MG PO TBEC
81.0000 mg | DELAYED_RELEASE_TABLET | Freq: Every day | ORAL | Status: DC
  Administered 2022-09-18 – 2022-09-20 (×3): 81 mg via ORAL
  Filled 2022-09-18 (×3): qty 1

## 2022-09-18 MED ORDER — FOLIC ACID 1 MG PO TABS
1.0000 mg | ORAL_TABLET | Freq: Every day | ORAL | Status: DC
  Administered 2022-09-18 – 2022-09-20 (×3): 1 mg via ORAL
  Filled 2022-09-18 (×3): qty 1

## 2022-09-18 MED ORDER — ACETAMINOPHEN 325 MG PO TABS
650.0000 mg | ORAL_TABLET | Freq: Four times a day (QID) | ORAL | Status: DC | PRN
  Administered 2022-09-18 – 2022-09-19 (×4): 650 mg via ORAL
  Filled 2022-09-18 (×4): qty 2

## 2022-09-18 MED ORDER — THIAMINE HCL 100 MG/ML IJ SOLN
500.0000 mg | Freq: Every day | INTRAVENOUS | Status: DC
  Administered 2022-09-18 – 2022-09-19 (×3): 500 mg via INTRAVENOUS
  Filled 2022-09-18 (×4): qty 5

## 2022-09-18 MED ORDER — TRAMADOL HCL 50 MG PO TABS
50.0000 mg | ORAL_TABLET | Freq: Four times a day (QID) | ORAL | Status: DC | PRN
  Filled 2022-09-18: qty 1

## 2022-09-18 NOTE — Plan of Care (Signed)

## 2022-09-18 NOTE — Progress Notes (Signed)
Progress Note   Patient: Edward Weber GNF:[id number] DOB: 01/20/1961 DOA: 09/17/2022     0 DOS: the patient was seen and examined on 09/18/2022    Subjective:  Patient seen this morning in the presence of patient's girlfriend and patient's nurse Admits to significant improvement in his mental status Complains of some right-sided abdominal pain but improving Denies nausea Vomiting chest pain or cough   Brief hospital course:  From HPI "Edward Weber is a 62 y.o. male with medical history significant for alcoholism with his last drink at 3 3 PM today states he does not have any withdrawal or shakes if he does not drink, coming for right arm pain.  Per ED provider when wife was at bedside she reported that he has been having some nausea and vomiting and hot flashes and difficulty with urination.  Patient is a very limited historian and suspect secondary to underlying dementia.  No reports of falls or trauma.  No reports of fevers or chills no headaches or blurred vision by patient.  Called home to speak to wife and no answer.  Chart review shows that patient high-grade prostate cancer and is followed by urology.  Although today's presentation is unclear and patient is a limited historian due to his history of Edward Weber and possibly dementia.  Suspect that patient may have had a fall at home because of else alcohol.  From wife's report it is clear that the patient has not been drinking or eating. In the emergency room patient is alert and awake and is oriented to self intermittently when answering questions he perseverates and does not know the answers.  Patient is being followed at [facility] for his Wernicke's syndrome.  Patient had a head CTA and February of this year which showed small lacunar infarcts in the right thalamus in the bilateral basal ganglia on, patient has also had right frontoparietal craniotomy defect."  Assessment and Plan:  >> Acute metabolic encephalopathy secondary to acute  rhabdomyolysis as well as acute renal failure Patient does have a baseline Edward Weber syndrome and unclear if this is worse according to family patient is much better today in terms of mental status compared to on presentation Continue thiamine therapy Monitor neurochecks closely Follow-up on free T4/TSH.  Ammonia level/A1c.  B12 and folate level.    >> Acute rhabdomyolysis: Presented with elevated CPK Holding statin and colchicine Continue IV fluid Monitor CPK closely    >> Acute kidney injury: Bilateral renal ultrasound performed today negative for any obstruction. Blood pressures have been soft. Hold amlodipine. I will continue to monitor renal function closely Given that patient's renal function was normal 4 months ago by presenting with significant creatinine elevation I have consulted nephrology Plan of care discussed with nephrologist Avoid nephrotoxic medications Renally dose all drugs Continue supplemental IV fluid for now  >> GERD: Continue Protonix   >> Recurrent falls: We will monitor CIWA and keep on CIWA protocol Patient counseled on alcohol cessation   >> History of intracerebral hemorrhage/history of stroke: I have personally reviewed patient's CT scan of the brain which was obtained  which did not show any acute intracranial pathology Aspirin initiated Holding on statin therapy on account of rhabdomyolysis   DVT prophylaxis: Heparin initiated   Consults: None   Advance Care Planning:    Code Status: Full Code    Family Communication: None   Physical Exam:   Appearance: He is not ill-appearing.  HENT:     Head: Normocephalic and atraumatic.  Eyes:     Extraocular Movements: Extraocular movements intact.  Cardiovascular:     Rate and Rhythm: Normal rate and regular rhythm.  Pulmonary:     Effort: Pulmonary effort is normal.  Abdominal:     General: Bowel sounds are normal.  Musculoskeletal:        General: Normal range of motion.  Skin:     General: Skin is warm.  Neurological: Alert and oriented x 3 Psychiatric:        Mood and Affect: Mood normal.         Data Reviewed: I have reviewed patient's labs sodium 140 potassium 3.7 creatinine 2.43 which has improved from 3.0 yesterday   Family Communication: Plan of care discussed with patient's girlfriend was present at bedside    Vitals:   09/17/22 1812 09/17/22 2133 09/17/22 2306 09/18/22 1032  BP:  110/80  127/75  Pulse:  62 66 72  Resp:  18  18  Temp:  98 F (36.7 C) 98 F (36.7 C) 98.9 F (37.2 C)  TempSrc:   Oral   SpO2:  96%  95%  Weight: 84 kg        Author: Loyce Dys, MD 09/18/2022 3:43 PM  For on call review [email or web address].

## 2022-09-18 NOTE — Consult Note (Signed)
[facility]  CONSULT NOTE    Date: 09/18/2022                  Patient Name:  Edward Weber  MRN: [id number]  DOB: 24-Jan-1961  Age / Sex: 62 y.o., male         PCP: Gracelyn Nurse, MD                 Service Requesting Consult: Dr. Meriam Sprague                 Reason for Consult: Acute Kidney Injury            History of Present Illness: Edward Weber presents with nausea, vomiting and poor PO intake for several days. Patient is unable to give much of a history due to dementia.   Patient complains of right arm pain after a fall.   Nephrology was consulted for acute kidney injury. Patient started on IV fluids.    Medications: Outpatient medications: Medications Prior to Admission  Medication Sig Dispense Refill Last Dose   albuterol (PROVENTIL HFA;VENTOLIN HFA) 108 (90 Base) MCG/ACT inhaler Inhale 2 puffs into the lungs daily as needed for wheezing or shortness of breath.   prn   allopurinol (ZYLOPRIM) 100 MG tablet Take 100 mg by mouth 2 (two) times daily.   09/16/2022   amLODipine (NORVASC) 5 MG tablet Take 5 mg by mouth daily.   09/16/2022   colchicine 0.6 MG tablet Take 0.6 mg by mouth 2 (two) times daily as needed (gout flare).   prn   escitalopram (LEXAPRO) 20 MG tablet Take 20 mg by mouth daily.   Past Month   gabapentin (NEURONTIN) 100 MG capsule Take 100 mg by mouth 3 (three) times daily.   09/16/2022   pantoprazole (PROTONIX) 40 MG tablet Take 40 mg by mouth daily.   Past Month   sildenafil (VIAGRA) 100 MG tablet Take 1 tablet (100 mg total) by mouth daily as needed for erectile dysfunction. 30 tablet 0 prn   thiamine (VITAMIN B1) 100 MG tablet Take 100 mg by mouth 2 (two) times daily.   Past Week    Current medications: Current Facility-Administered Medications  Medication Dose Route Frequency Provider Last Rate Last Admin   acetaminophen (TYLENOL) tablet 650 mg  650 mg Oral Q6H PRN Gertha Calkin, MD   650 mg at 09/18/22 1008   Or    acetaminophen (TYLENOL) suppository 650 mg  650 mg Rectal Q6H PRN Gertha Calkin, MD       aspirin EC tablet 81 mg  81 mg Oral Daily Rosezetta Schlatter T, MD       escitalopram (LEXAPRO) tablet 20 mg  20 mg Oral Daily Lowella Bandy, RPH   20 mg at 09/18/22 1533   folic acid (FOLVITE) tablet 1 mg  1 mg Oral Daily Rosezetta Schlatter T, MD   1 mg at 09/18/22 1533   heparin injection 5,000 Units  5,000 Units Subcutaneous Q8H Loyce Dys, MD       lactated ringers infusion   Intravenous Continuous Loyce Dys, MD 75 mL/hr at 09/18/22 1536 New Bag at 09/18/22 1536   pantoprazole (PROTONIX) injection 40 mg  40 mg Intravenous Q12H Irena Cords V, MD   40 mg at 09/18/22 1019   [START ON 09/19/2022] pneumococcal 23 valent vaccine (PNEUMOVAX-23) injection 0.5 mL  0.5 mL Intramuscular Tomorrow-1000 Loyce Dys, MD  sodium chloride flush (NS) 0.9 % injection 3 mL  3 mL Intravenous Q12H Irena Cords V, MD   3 mL at 09/18/22 0101   thiamine (VITAMIN B1) 500 mg in sodium chloride 0.9 % 50 mL IVPB  500 mg Intravenous Daily Irena Cords V, MD 100 mL/hr at 09/18/22 0212 500 mg at 09/18/22 0981   traMADol (ULTRAM) tablet 50 mg  50 mg Oral Q6H PRN Loyce Dys, MD          Allergies: Allergies  Allergen Reactions   Bee Venom Anaphylaxis   Beef-Derived Products     Personal preference   Lactalbumin    Milk-Related Compounds Nausea And Vomiting    Intoloerance   Pegademase Bovine     Other reaction(s): Unknown Personal preference Personal preference Personal preference    Tilactase Nausea And Vomiting    Intoloerance      Past Medical History: Past Medical History:  Diagnosis Date   Alcoholism (HCC)    Allergic rhinitis    Allergic state    Anemia    Arthritis    Cancer (HCC)    Chronic hepatitis C (HCC)    Depression    Esophagitis, reflux    GERD (gastroesophageal reflux disease)    Helicobacter pylori gastritis    Hepatitis    Hypertension    Neuromuscular disorder (HCC)     Osteoarthrosis    Peripheral neuropathy    Rhabdomyolysis    Sickle cell anemia (HCC)    Stroke (HCC)    Stroke (HCC)    Vitiligo      Past Surgical History: Past Surgical History:  Procedure Laterality Date   COLON SURGERY     COLONOSCOPY WITH PROPOFOL N/A 05/09/2015   Procedure: COLONOSCOPY WITH PROPOFOL;  Surgeon: Wallace Cullens, MD;  Location: [facility] ENDOSCOPY;  Service: Gastroenterology;  Laterality: N/A;   ESOPHAGOGASTRODUODENOSCOPY N/A 05/09/2015   Procedure: ESOPHAGOGASTRODUODENOSCOPY (EGD);  Surgeon: Wallace Cullens, MD;  Location: [facility] ENDOSCOPY;  Service: Gastroenterology;  Laterality: N/A;   ESOPHAGOGASTRODUODENOSCOPY (EGD) WITH PROPOFOL N/A 05/04/2017   Procedure: ESOPHAGOGASTRODUODENOSCOPY (EGD) WITH PROPOFOL;  Surgeon: Toledo, Boykin Nearing, MD;  Location: [facility] ENDOSCOPY;  Service: Gastroenterology;  Laterality: N/A;   FINGER DEBRIDEMENT  1976   s/p infected dog bite   TEE WITHOUT CARDIOVERSION N/A 05/20/2012   Procedure: TRANSESOPHAGEAL ECHOCARDIOGRAM (TEE);  Surgeon: Pricilla Riffle, MD;  Location: [facility] ENDOSCOPY;  Service: Cardiovascular;  Laterality: N/A;     Family History: History reviewed. No pertinent family history.   Social History: Social History   Socioeconomic History   Marital status: Single    Spouse name: Not on file   Number of children: Not on file   Years of education: Not on file   Highest education level: Not on file  Occupational History   Not on file  Tobacco Use   Smoking status: Every Day    Types: Cigarettes   Smokeless tobacco: Never  Substance and Sexual Activity   Alcohol use: Yes    Alcohol/week: 9.0 standard drinks of alcohol    Types: 6 Cans of beer, 3 Shots of liquor per week    Comment: only on the weekends   Drug use: Yes    Types: Marijuana, Cocaine   Sexual activity: Not on file  Other Topics Concern   Not on file  Social History Narrative   Not on file   Social Determinants of Health   Financial Resource Strain: Not on file  Food  Insecurity: No Food Insecurity (09/18/2022)  Hunger Vital Sign    Worried About Running Out of Food in the Last Year: Never true    Ran Out of Food in the Last Year: Never true  Transportation Needs: No Transportation Needs (09/18/2022)   PRAPARE - Administrator, Civil Service (Medical): No    Lack of Transportation (Non-Medical): No  Physical Activity: Not on file  Stress: Not on file  Social Connections: Not on file  Intimate Partner Violence: Not At Risk (09/18/2022)   Humiliation, Afraid, Rape, and Kick questionnaire    Fear of Current or Ex-Partner: No    Emotionally Abused: No    Physically Abused: No    Sexually Abused: No      Vital Signs: Blood pressure 127/75, pulse 72, temperature 98.9 F (37.2 C), resp. rate 18, weight 84 kg, SpO2 95 %.  Weight trends: Filed Weights   09/17/22 1812  Weight: 84 kg    Physical Exam: General: NAD, laying in bed  Head: Normocephalic, atraumatic. Moist oral mucosal membranes  Eyes: Anicteric, PERRL  Neck: Supple, trachea midline  Lungs:  Clear to auscultation  Heart: Regular rate and rhythm  Abdomen:  Soft, nontender,   Extremities:  no peripheral edema.  Neurologic: Nonfocal, moving all four extremities  Skin: No lesions         Lab results: Basic Metabolic Panel: Recent Labs  Lab 09/17/22 1818 09/18/22 0513  NA 137 140  K 3.6 3.7  CL 106 114*  CO2 15* 18*  GLUCOSE 162* 118*  BUN 47* 46*  CREATININE 3.03* 2.46*  CALCIUM 9.9 8.9    Liver Function Tests: Recent Labs  Lab 09/17/22 1818 09/18/22 0513  AST 41 30  ALT 29 22  ALKPHOS 60 45  BILITOT 1.0 0.9  PROT 8.9* 6.8  ALBUMIN 5.0 4.1   Recent Labs  Lab 09/17/22 1818  LIPASE 39   Recent Labs  Lab 09/18/22 0136  AMMONIA 24    CBC: Recent Labs  Lab 09/17/22 1818 09/18/22 0513  WBC 7.8 7.7  HGB 13.6 11.2*  HCT 40.5 33.9*  MCV 93.5 95.0  PLT 306 228    Cardiac Enzymes: Recent Labs  Lab 09/17/22 1818  CKTOTAL 777*     BNP: Invalid input(s): "POCBNP"  CBG: No results for input(s): "GLUCAP" in the last 168 hours.  Microbiology: Results for orders placed or performed during the hospital encounter of 09/17/22  Culture, blood (Routine X 2) w Reflex to ID Panel     Status: None (Preliminary result)   Collection Time: 09/18/22  1:36 AM   Specimen: BLOOD  Result Value Ref Range Status   Specimen Description BLOOD BLOOD LEFT HAND  Final   Special Requests   Final    BOTTLES DRAWN AEROBIC AND ANAEROBIC Blood Culture adequate volume   Culture   Final    NO GROWTH < 12 HOURS Performed at [facility], [address], [place], [place] [address]    Report Status PENDING  Incomplete  Culture, blood (Routine X 2) w Reflex to ID Panel     Status: None (Preliminary result)   Collection Time: 09/18/22  1:39 AM   Specimen: BLOOD  Result Value Ref Range Status   Specimen Description BLOOD RIGHT HAND  Final   Special Requests   Final    BOTTLES DRAWN AEROBIC AND ANAEROBIC Blood Culture adequate volume   Culture   Final    NO GROWTH < 12 HOURS Performed at [facility], [address] [place], [place] [address]    Report Status PENDING  Incomplete    Coagulation Studies: Recent Labs    09/18/22 0051  LABPROT 16.3*  INR 1.3*    Urinalysis: Recent Labs    09/17/22 2214  COLORURINE YELLOW*  LABSPEC 1.017  PHURINE 5.0  GLUCOSEU NEGATIVE  HGBUR NEGATIVE  BILIRUBINUR NEGATIVE  KETONESUR NEGATIVE  PROTEINUR 30*  NITRITE NEGATIVE  LEUKOCYTESUR NEGATIVE      Imaging: CT HEAD WO CONTRAST ( )  Result Date: 09/18/2022 CLINICAL DATA:  Nausea and vomiting EXAM: CT HEAD WITHOUT CONTRAST TECHNIQUE: Contiguous axial images were obtained from the base of the skull through the vertex without intravenous contrast. RADIATION DOSE REDUCTION: This exam was performed according to the departmental dose-optimization program which includes automated exposure control, adjustment of the mA  and/or kV according to patient size and/or use of iterative reconstruction technique. COMPARISON:  None Available. FINDINGS: Brain: No evidence of acute infarction, hemorrhage, hydrocephalus, extra-axial collection or mass lesion/mass effect. Chronic white matter ischemic changes are noted. Scattered lacunar infarcts are again noted in the right thalamus and basal ganglia bilaterally. Vascular: No hyperdense vessel or unexpected calcification. Skull: Changes consistent with right-sided parietal craniotomy. Sinuses/Orbits: No acute finding. Other: None. IMPRESSION: Chronic changes without acute abnormality. Electronically Signed   By: Alcide Clever M.D.   On: 09/18/2022 01:15   US RENAL  Result Date: 09/18/2022 CLINICAL DATA:  Acute renal injury EXAM: RENAL / URINARY TRACT ULTRASOUND COMPLETE COMPARISON:  02/16/2020 FINDINGS: Right Kidney: Renal measurements: 9.5 x 4.7 x 4.3 cm. = volume: 99 mL. Echogenicity within normal limits. No mass or hydronephrosis visualized. Left Kidney: Renal measurements: 9.7 x 6.3 x 5.3 cm. = volume: 172 mL. No mass lesion or hydronephrosis is noted. 8 mm stone is noted in the lower pole stable from prior CT examination Bladder: Appears normal for degree of bladder distention. Other: None. IMPRESSION: Lower pole left renal stone. No other focal abnormality is noted. Electronically Signed   By: Alcide Clever M.D.   On: 09/18/2022 00:21   DG Chest 1 View  Result Date: 09/17/2022 CLINICAL DATA:  Nausea and vomiting, initial encounter EXAM: PORTABLE CHEST 1 VIEW COMPARISON:  01/17/2022 FINDINGS: The heart size and mediastinal contours are within normal limits. Both lungs are clear. The visualized skeletal structures are unremarkable. IMPRESSION: No active disease. Electronically Signed   By: Alcide Clever M.D.   On: 09/17/2022 20:18     Assessment & Plan: Edward Weber is a 62 y.o.  male with alcoholism, chronic hepatitis C, sickle cell, CVA, hypertension, GERD, , who was  admitted to [facility] on 09/17/2022 for Right arm pain [M79.601] AKI (acute kidney injury) (HCC) [N17.9]  Acute Kidney Injury on chronic kidney disease stage IIIA: with baseline creatinine of 1.56 with GFR of 50 on 05/15/22. Acute kidney injury secondary to prerenal azotemia secondary to poor PO intake, nausea and vomiting. No IV contrast exposure. No obstruction on ultrasound.  - creatinine improving - Continue IVF: LR at  Hypertension with chronic kidney disease: 127/75. Holding home regimen of amlodipine.   Rhabdomyolysis: mild. On IVF.      LOS: 0 Edward Weber 6/7/20244:55 PM
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
## 2022-09-19 DIAGNOSIS — M79601 Pain in right arm: Secondary | ICD-10-CM | POA: Diagnosis not present

## 2022-09-19 LAB — BASIC METABOLIC PANEL
Anion gap: 9 (ref 5–15)
BUN: 24 mg/dL — ABNORMAL HIGH (ref 8–23)
CO2: 18 mmol/L — ABNORMAL LOW (ref 22–32)
Calcium: 8.7 mg/dL — ABNORMAL LOW (ref 8.9–10.3)
Chloride: 106 mmol/L (ref 98–111)
Creatinine, Ser: 1.44 mg/dL — ABNORMAL HIGH (ref 0.61–1.24)
GFR, Estimated: 54 mL/min — ABNORMAL LOW (ref >60)
Glucose, Bld: 130 mg/dL — ABNORMAL HIGH (ref 70–99)
Potassium: 3.6 mmol/L (ref 3.5–5.1)
Sodium: 133 mmol/L — ABNORMAL LOW (ref 135–145)

## 2022-09-19 LAB — CULTURE, BLOOD (ROUTINE X 2)

## 2022-09-19 NOTE — Plan of Care (Signed)

## 2022-09-19 NOTE — Progress Notes (Signed)
[facility]  ROUNDING NOTE   Subjective:   Patient seen laying in bed No family present Tolerating small meals without nausea or vomiting Room air No lower extremity edema  Objective:  Vital signs in last 24 hours:  Temp:  [98.8 F (37.1 C)-99.5 F (37.5 C)] 99.5 F (37.5 C) (06/08 0829) Pulse Rate:  [52-57] 52 (06/08 0829) Resp:  [18-19] 19 (06/08 0829) BP: (123-144)/(79-80) 144/79 (06/08 0829) SpO2:  [94 %-98 %] 98 % (06/08 0829)  Weight change:  Filed Weights   09/17/22 1812  Weight: 84 kg    Intake/Output: I/O last 3 completed shifts: In: 2100 [I.V.:2000; IV Piggyback:100] Out: -    Intake/Output this shift:  Total I/O In: 240 [P.O.:240] Out: -   Physical Exam: General: NAD  Head: Normocephalic, atraumatic. Moist oral mucosal membranes  Eyes: Anicteric  Lungs:  Clear to auscultation, normal effort  Heart: Regular rate and rhythm  Abdomen:  Soft, nontender  Extremities:  No peripheral edema.  Neurologic: Nonfocal, moving all four extremities  Skin: No lesions  Access: None    Basic Metabolic Panel: Recent Labs  Lab 09/17/22 1818 09/18/22 0513 09/19/22 1002  NA 137 140 133*  K 3.6 3.7 3.6  CL 106 114* 106  CO2 15* 18* 18*  GLUCOSE 162* 118* 130*  BUN 47* 46* 24*  CREATININE 3.03* 2.46* 1.44*  CALCIUM 9.9 8.9 8.7*    Liver Function Tests: Recent Labs  Lab 09/17/22 1818 09/18/22 0513  AST 41 30  ALT 29 22  ALKPHOS 60 45  BILITOT 1.0 0.9  PROT 8.9* 6.8  ALBUMIN 5.0 4.1   Recent Labs  Lab 09/17/22 1818  LIPASE 39   Recent Labs  Lab 09/18/22 0136  AMMONIA 24    CBC: Recent Labs  Lab 09/17/22 1818 09/18/22 0513  WBC 7.8 7.7  HGB 13.6 11.2*  HCT 40.5 33.9*  MCV 93.5 95.0  PLT 306 228    Cardiac Enzymes: Recent Labs  Lab 09/17/22 1818  CKTOTAL 777*    BNP: Invalid input(s): "POCBNP"  CBG: No results for input(s): "GLUCAP" in the last 168 hours.  Microbiology: Results for orders placed or  performed during the hospital encounter of 09/17/22  Culture, blood (Routine X 2) w Reflex to ID Panel     Status: None (Preliminary result)   Collection Time: 09/18/22  1:36 AM   Specimen: BLOOD  Result Value Ref Range Status   Specimen Description BLOOD BLOOD LEFT HAND  Final   Special Requests   Final    BOTTLES DRAWN AEROBIC AND ANAEROBIC Blood Culture adequate volume   Culture   Final    NO GROWTH 1 DAY Performed at [facility], [address], [place], [place] [address]    Report Status PENDING  Incomplete  Culture, blood (Routine X 2) w Reflex to ID Panel     Status: None (Preliminary result)   Collection Time: 09/18/22  1:39 AM   Specimen: BLOOD  Result Value Ref Range Status   Specimen Description BLOOD RIGHT HAND  Final   Special Requests   Final    BOTTLES DRAWN AEROBIC AND ANAEROBIC Blood Culture adequate volume   Culture   Final    NO GROWTH 1 DAY Performed at [facility], [address] [place], [place] [address]    Report Status PENDING  Incomplete    Coagulation Studies: Recent Labs    09/18/22 0051  LABPROT 16.3*  INR 1.3*    Urinalysis: Recent  Labs    09/17/22 2214  COLORURINE YELLOW*  LABSPEC 1.017  PHURINE 5.0  GLUCOSEU NEGATIVE  HGBUR NEGATIVE  BILIRUBINUR NEGATIVE  KETONESUR NEGATIVE  PROTEINUR 30*  NITRITE NEGATIVE  LEUKOCYTESUR NEGATIVE      Imaging: CT HEAD WO CONTRAST ( )  Result Date: 09/18/2022 CLINICAL DATA:  Nausea and vomiting EXAM: CT HEAD WITHOUT CONTRAST TECHNIQUE: Contiguous axial images were obtained from the base of the skull through the vertex without intravenous contrast. RADIATION DOSE REDUCTION: This exam was performed according to the departmental dose-optimization program which includes automated exposure control, adjustment of the mA and/or kV according to patient size and/or use of iterative reconstruction technique. COMPARISON:  None Available. FINDINGS: Brain: No evidence of acute infarction,  hemorrhage, hydrocephalus, extra-axial collection or mass lesion/mass effect. Chronic white matter ischemic changes are noted. Scattered lacunar infarcts are again noted in the right thalamus and basal ganglia bilaterally. Vascular: No hyperdense vessel or unexpected calcification. Skull: Changes consistent with right-sided parietal craniotomy. Sinuses/Orbits: No acute finding. Other: None. IMPRESSION: Chronic changes without acute abnormality. Electronically Signed   By: Alcide Clever M.D.   On: 09/18/2022 01:15   US RENAL  Result Date: 09/18/2022 CLINICAL DATA:  Acute renal injury EXAM: RENAL / URINARY TRACT ULTRASOUND COMPLETE COMPARISON:  02/16/2020 FINDINGS: Right Kidney: Renal measurements: 9.5 x 4.7 x 4.3 cm. = volume: 99 mL. Echogenicity within normal limits. No mass or hydronephrosis visualized. Left Kidney: Renal measurements: 9.7 x 6.3 x 5.3 cm. = volume: 172 mL. No mass lesion or hydronephrosis is noted. 8 mm stone is noted in the lower pole stable from prior CT examination Bladder: Appears normal for degree of bladder distention. Other: None. IMPRESSION: Lower pole left renal stone. No other focal abnormality is noted. Electronically Signed   By: Alcide Clever M.D.   On: 09/18/2022 00:21   DG Chest 1 View  Result Date: 09/17/2022 CLINICAL DATA:  Nausea and vomiting, initial encounter EXAM: PORTABLE CHEST 1 VIEW COMPARISON:  01/17/2022 FINDINGS: The heart size and mediastinal contours are within normal limits. Both lungs are clear. The visualized skeletal structures are unremarkable. IMPRESSION: No active disease. Electronically Signed   By: Alcide Clever M.D.   On: 09/17/2022 20:18     Medications:    thiamine (VITAMIN B1) injection Stopped (09/18/22 2137)    aspirin EC  81 mg Oral Daily   escitalopram  20 mg Oral Daily   folic acid  1 mg Oral Daily   heparin injection (subcutaneous)  5,000 Units Subcutaneous Q8H   pantoprazole (PROTONIX) IV  40 mg Intravenous Q12H   pneumococcal 23  valent vaccine  0.5 mL Intramuscular Tomorrow-1000   sodium chloride flush  3 mL Intravenous Q12H   acetaminophen **OR** acetaminophen, traMADol  Assessment/ Plan:  [name] is a 62 y.o.  male presents with nausea, vomiting and poor PO intake for several days. Patient is unable to give much of a history due to dementia. Patient was admitted for Right arm pain [M79.601] AKI (acute kidney injury) (HCC) [N17.9]   Acute Kidney Injury on chronic kidney disease stage IIIA: with baseline creatinine of 1.56 with GFR of 50 on 05/15/22. Acute kidney injury secondary to prerenal azotemia secondary to poor PO intake, nausea and vomiting. No IV contrast exposure. No obstruction on ultrasound.  - Renal function improving with IVF - Oral intake has also improved - Okay with stopping IVF and evaluate renal indices.   Lab Results  Component Value Date   CREATININE 1.44 (H)  09/19/2022   CREATININE 2.46 (H) 09/18/2022   CREATININE 3.03 (H) 09/17/2022    Intake/Output Summary (Last 24 hours) at 09/19/2022 1149 Last data filed at 09/19/2022 1038 Gross per 24 hour  Intake 2340 ml  Output --  Net 2340 ml    Hypertension with chronic kidney disease: Holding home regimen of amlodipine.    Rhabdomyolysis: mild. IVF stopped.    LOS: 1   6/8/202411:49 AM

## 2022-09-19 NOTE — Progress Notes (Signed)
Progress Note   Patient: Edward Weber VWU:[id number] DOB: 12-10-1960 DOA: 09/17/2022     1 DOS: the patient was seen and examined on 09/19/2022    Subjective:  Patient seen and examined at bedside this morning He denies worsening abdominal pain chest pain or cough Renal function improving nicely     Brief hospital course:   From HPI "[name] is a 62 y.o. male with medical history significant for alcoholism with his last drink at 3 3 PM today states he does not have any withdrawal or shakes if he does not drink, coming for right arm pain.  Per ED provider when wife was at bedside she reported that he has been having some nausea and vomiting and hot flashes and difficulty with urination.  Patient is a very limited historian and suspect secondary to underlying dementia.  No reports of falls or trauma.  No reports of fevers or chills no headaches or blurred vision by patient.  Called home to speak to wife and no answer.  Chart review shows that patient high-grade prostate cancer and is followed by urology.  Although today's presentation is unclear and patient is a limited historian due to his history of Warnicke's and possibly dementia.  Suspect that patient may have had a fall at home because of else alcohol.  From wife's report it is clear that the patient has not been drinking or eating. In the emergency room patient is alert and awake and is oriented to self intermittently when answering questions he perseverates and does not know the answers.  Patient is being followed at [facility] for his Wernicke's syndrome.  Patient had a head CTA and February of this year which showed small lacunar infarcts in the right thalamus in the bilateral basal ganglia on, patient has also had right frontoparietal craniotomy defect."   Assessment and Plan:   >> Acute metabolic encephalopathy secondary to acute rhabdomyolysis as well as acute renal failure Patient does have a baseline Warnicke's syndrome and  unclear if this is worse according to family patient is much better today in terms of mental status compared to on presentation Continue thiamine therapy Monitor neurochecks closely Follow-up on free T4/TSH.  Ammonia level/A1c.  B12 and folate level.    >> Acute rhabdomyolysis:-Improved Presented with elevated CPK Holding statin and colchicine IV fluid discontinued today given improvement Monitor CPK closely     >> Acute kidney injury:-Improved Bilateral renal ultrasound performed today negative for any obstruction. Blood pressures have been soft. Hold amlodipine. I will continue to monitor renal function closely Avoid nephrotoxic medications Continue to monitor renal function Case discussed with nephrologist today and we will stop IV fluid significant improvement   >> GERD:-Stable Continue Protonix   >> Recurrent falls: We will monitor CIWA and keep on CIWA protocol Patient counseled on alcohol cessation   >> History of intracerebral hemorrhage/history of stroke: I have personally reviewed patient's CT scan of the brain which was obtained  which did not show any acute intracranial pathology Aspirin initiated Holding on statin therapy on account of rhabdomyolysis Likely will resume statin therapy at discharge   DVT prophylaxis: Heparin initiated   Consults: None   Advance Care Planning:    Code Status: Full Code    Family Communication: None     Physical Exam:    Appearance: He is not ill-appearing.  HENT:     Head: Normocephalic and atraumatic.   Eyes:     Extraocular Movements: Extraocular movements intact.  Cardiovascular:  Rate and Rhythm: Normal rate and regular rhythm.  Pulmonary:     Effort: Pulmonary effort is normal.  Abdominal:     General: Bowel sounds are normal.  Musculoskeletal:        General: Normal range of motion.  Skin:    General: Skin is warm.  Neurological: Alert and oriented x 3 Psychiatric:        Mood and Affect: Mood normal.              Data Reviewed:I personally reviewed patient's labs showing sodium 133 potassium 3.6 creatinine 1.44 which is significantly improved from 2.4 yesterday     Family Communication: I discussed the plan of care with patient and family       Vitals:   09/17/22 2306 09/18/22 1032 09/19/22 0041 09/19/22 0829  BP:  127/75 123/80 (!) 144/79  Pulse: 66 72 (!) 57 (!) 52  Resp:  18 18 19   Temp: 98 F (36.7 C) 98.9 F (37.2 C) 98.8 F (37.1 C) 99.5 F (37.5 C)  TempSrc: Oral     SpO2:  95% 94% 98%  Weight:         Author: Loyce Dys, MD 09/19/2022 3:05 PM  For on call review [email or web address].

## 2022-09-20 DIAGNOSIS — M79601 Pain in right arm: Secondary | ICD-10-CM | POA: Diagnosis not present

## 2022-09-20 LAB — CBC WITH DIFFERENTIAL/PLATELET
Abs Immature Granulocytes: 0.02 10*3/uL (ref 0.00–0.07)
Basophils Absolute: 0 10*3/uL (ref 0.0–0.1)
Basophils Relative: 1 %
Eosinophils Absolute: 0.3 10*3/uL (ref 0.0–0.5)
Eosinophils Relative: 5 %
HCT: 31.8 % — ABNORMAL LOW (ref 39.0–52.0)
Hemoglobin: 10.8 g/dL — ABNORMAL LOW (ref 13.0–17.0)
Immature Granulocytes: 0 %
Lymphocytes Relative: 36 %
Lymphs Abs: 1.9 10*3/uL (ref 0.7–4.0)
MCH: 31.8 pg (ref 26.0–34.0)
MCHC: 34 g/dL (ref 30.0–36.0)
MCV: 93.5 fL (ref 80.0–100.0)
Monocytes Absolute: 0.5 10*3/uL (ref 0.1–1.0)
Monocytes Relative: 9 %
Neutro Abs: 2.6 10*3/uL (ref 1.7–7.7)
Neutrophils Relative %: 49 %
Platelets: 208 10*3/uL (ref 150–400)
RBC: 3.4 MIL/uL — ABNORMAL LOW (ref 4.22–5.81)
RDW: 13.6 % (ref 11.5–15.5)
WBC: 5.3 10*3/uL (ref 4.0–10.5)
nRBC: 0 % (ref 0.0–0.2)

## 2022-09-20 LAB — COMPREHENSIVE METABOLIC PANEL
ALT: 25 U/L (ref 0–44)
AST: 37 U/L (ref 15–41)
Albumin: 4.3 g/dL (ref 3.5–5.0)
Alkaline Phosphatase: 43 U/L (ref 38–126)
Anion gap: 9 (ref 5–15)
BUN: 20 mg/dL (ref 8–23)
CO2: 20 mmol/L — ABNORMAL LOW (ref 22–32)
Calcium: 8.4 mg/dL — ABNORMAL LOW (ref 8.9–10.3)
Chloride: 107 mmol/L (ref 98–111)
Creatinine, Ser: 1.32 mg/dL — ABNORMAL HIGH (ref 0.61–1.24)
GFR, Estimated: >60 mL/min (ref >60)
Glucose, Bld: 100 mg/dL — ABNORMAL HIGH (ref 70–99)
Potassium: 3.6 mmol/L (ref 3.5–5.1)
Sodium: 136 mmol/L (ref 135–145)
Total Bilirubin: 0.6 mg/dL (ref 0.3–1.2)
Total Protein: 7 g/dL (ref 6.5–8.1)

## 2022-09-20 LAB — CULTURE, BLOOD (ROUTINE X 2)

## 2022-09-20 MED ORDER — ASPIRIN 81 MG PO TBEC: 81.0000 mg | DELAYED_RELEASE_TABLET | Freq: Every day | ORAL | 12 refills | Status: DC

## 2022-09-20 MED ORDER — FOLIC ACID 1 MG PO TABS: 1.0000 mg | ORAL_TABLET | Freq: Every day | ORAL | 0 refills | Status: AC

## 2022-09-20 NOTE — Progress Notes (Signed)
Patient and family received written and verbal discharge instructions, acknowledge understanding and states he will comply with appointments and medication regimen.Taken to car by wheelchair no distress when leaving the floor.

## 2022-09-20 NOTE — Progress Notes (Signed)
[facility]  ROUNDING NOTE   Subjective:   Patient resting in bed Tolerating meals without nausea or vomiting Remains on room air No lower extremity edema  Creatinine 1.32  Objective:  Vital signs in last 24 hours:  Temp:  [98.2 F (36.8 C)] 98.2 F (36.8 C) (06/08 1716) Pulse Rate:  [61] 61 (06/08 1716) Resp:  [18] 18 (06/08 1716) BP: (148)/(89) 148/89 (06/08 1716) SpO2:  [100 %] 100 % (06/08 1716)  Weight change:  Filed Weights   09/17/22 1812  Weight: 84 kg    Intake/Output: I/O last 3 completed shifts: In: 1553.6 [P.O.:480; I.V.:1023.6; IV Piggyback:50] Out: 400 [Urine:400]   Intake/Output this shift:  Total I/O In: 240 [P.O.:240] Out: -   Physical Exam: General: NAD  Head: Normocephalic, atraumatic. Moist oral mucosal membranes  Eyes: Anicteric  Lungs:  Clear to auscultation, normal effort  Heart: Regular rate and rhythm  Abdomen:  Soft, nontender  Extremities:  No peripheral edema.  Neurologic: Nonfocal, moving all four extremities  Skin: No lesions  Access: None    Basic Metabolic Panel: Recent Labs  Lab 09/17/22 1818 09/18/22 0513 09/19/22 1002 09/20/22 0425  NA 137 140 133* 136  K 3.6 3.7 3.6 3.6  CL 106 114* 106 107  CO2 15* 18* 18* 20*  GLUCOSE 162* 118* 130* 100*  BUN 47* 46* 24* 20  CREATININE 3.03* 2.46* 1.44* 1.32*  CALCIUM 9.9 8.9 8.7* 8.4*     Liver Function Tests: Recent Labs  Lab 09/17/22 1818 09/18/22 0513 09/20/22 0425  AST 41 30 37  ALT 29 22 25   ALKPHOS 60 45 43  BILITOT 1.0 0.9 0.6  PROT 8.9* 6.8 7.0  ALBUMIN 5.0 4.1 4.3    Recent Labs  Lab 09/17/22 1818  LIPASE 39    Recent Labs  Lab 09/18/22 0136  AMMONIA 24     CBC: Recent Labs  Lab 09/17/22 1818 09/18/22 0513 09/20/22 0425  WBC 7.8 7.7 5.3  NEUTROABS  --   --  2.6  HGB 13.6 11.2* 10.8*  HCT 40.5 33.9* 31.8*  MCV 93.5 95.0 93.5  PLT 306 228 208     Cardiac Enzymes: Recent Labs  Lab 09/17/22 1818  CKTOTAL 777*      BNP: Invalid input(s): "POCBNP"  CBG: No results for input(s): "GLUCAP" in the last 168 hours.  Microbiology: Results for orders placed or performed during the hospital encounter of 09/17/22  Culture, blood (Routine X 2) w Reflex to ID Panel     Status: None (Preliminary result)   Collection Time: 09/18/22  1:36 AM   Specimen: BLOOD  Result Value Ref Range Status   Specimen Description BLOOD BLOOD LEFT HAND  Final   Special Requests   Final    BOTTLES DRAWN AEROBIC AND ANAEROBIC Blood Culture adequate volume   Culture   Final    NO GROWTH 2 DAYS Performed at [facility], [address], [place], [place] [address]    Report Status PENDING  Incomplete  Culture, blood (Routine X 2) w Reflex to ID Panel     Status: None (Preliminary result)   Collection Time: 09/18/22  1:39 AM   Specimen: BLOOD  Result Value Ref Range Status   Specimen Description BLOOD RIGHT HAND  Final   Special Requests   Final    BOTTLES DRAWN AEROBIC AND ANAEROBIC Blood Culture adequate volume   Culture   Final    NO GROWTH 2 DAYS Performed at [facility], [address] [place], [place] [address]    Report Status PENDING  Incomplete    Coagulation Studies: Recent Labs    09/18/22 0051  LABPROT 16.3*  INR 1.3*     Urinalysis: Recent Labs    09/17/22 2214  COLORURINE YELLOW*  LABSPEC 1.017  PHURINE 5.0  GLUCOSEU NEGATIVE  HGBUR NEGATIVE  BILIRUBINUR NEGATIVE  KETONESUR NEGATIVE  PROTEINUR 30*  NITRITE NEGATIVE  LEUKOCYTESUR NEGATIVE       Imaging: No results found.   Medications:    thiamine (VITAMIN B1) injection 500 mg (09/19/22 2124)    aspirin EC  81 mg Oral Daily   escitalopram  20 mg Oral Daily   folic acid  1 mg Oral Daily   heparin injection (subcutaneous)  5,000 Units Subcutaneous Q8H   pantoprazole (PROTONIX) IV  40 mg Intravenous Q12H   pneumococcal 23 valent vaccine  0.5 mL Intramuscular Tomorrow-1000   sodium chloride flush  3 mL  Intravenous Q12H   acetaminophen **OR** acetaminophen, traMADol  Assessment/ Plan:  [name] is a 62 y.o.  male presents with nausea, vomiting and poor PO intake for several days. Patient is unable to give much of a history due to dementia. Patient was admitted for Right arm pain [M79.601] AKI (acute kidney injury) (HCC) [N17.9]   Acute Kidney Injury on chronic kidney disease stage IIIA: with baseline creatinine of 1.56 with GFR of 50 on 05/15/22. Acute kidney injury secondary to prerenal azotemia secondary to poor PO intake, nausea and vomiting. No IV contrast exposure. No obstruction on ultrasound.  - Creatinine has improved beyond stated baseline - Patient cleared to discharge from renal stance. Will schedule follow up appt with our office at discharge.   Lab Results  Component Value Date   CREATININE 1.32 (H) 09/20/2022   CREATININE 1.44 (H) 09/19/2022   CREATININE 2.46 (H) 09/18/2022    Intake/Output Summary (Last 24 hours) at 09/20/2022 1054 Last data filed at 09/20/2022 1043 Gross per 24 hour  Intake 480 ml  Output 400 ml  Net 80 ml     Hypertension with chronic kidney disease: Holding home regimen of amlodipine. Held at this time   Rhabdomyolysis: mild.   LOS: 2   6/9/202410:54 AM

## 2022-09-20 NOTE — Discharge Summary (Signed)
Physician Discharge Summary   Patient: Edward Weber MRN: [id number] DOB: Jan 22, 1961  Admit date:     09/17/2022  Discharge date: 09/20/22  Discharge Physician: Loyce Dys   PCP: Gracelyn Nurse, MD    Discharge Diagnoses: Acute metabolic encephalopathy secondary to acute rhabdomyolysis as well as acute renal failure >> Acute rhabdomyolysis:-Improved >> Acute kidney injury:-Improved >> GERD:-Stable >> Recurrent falls: >> History of intracerebral hemorrhage/history of stroke:  Hospital Course: Edward Weber is a 62 y.o. male with medical history significant for alcoholism states he does not have any withdrawal or shakes if he does not drink, coming for right arm pain.  Per ED provider when wife was at bedside she reported that he has been having some nausea and vomiting and hot flashes and difficulty with urination.  Chart review shows that patient high-grade prostate cancer and is followed by urology. In the emergency room patient is alert and awake and is oriented to self intermittently when answering questions he perseverates and does not know the answers.  He was found to be in no acute renal failure with associated acute metabolic encephalopathy.  Nephrologist was consulted.  Patient responded greatly to the IV fluid therapy with improvement in renal function.  Patient is now fully awake and alert and has been cleared by nephrologist for discharge today and will follow-up with them at the clinic.  Consultants: Neurology Procedures performed: None Disposition: Home Diet recommendation:  Discharge Diet Orders (From admission, onward)     Start     Ordered   09/20/22 0000  Diet - low sodium heart healthy        09/20/22 1217           Cardiac diet DISCHARGE MEDICATION: Allergies as of 09/20/2022       Reactions   Bee Venom Anaphylaxis   Beef-derived Products    Personal preference   Lactalbumin    Milk-related Compounds Nausea And Vomiting   Intoloerance    Pegademase Bovine    Other reaction(s): Unknown Personal preference Personal preference Personal preference   Tilactase Nausea And Vomiting   Intoloerance        Medication List     STOP taking these medications    allopurinol 100 MG tablet Commonly known as: ZYLOPRIM   colchicine 0.6 MG tablet       TAKE these medications    albuterol 108 (90 Base) MCG/ACT inhaler Commonly known as: VENTOLIN HFA Inhale 2 puffs into the lungs daily as needed for wheezing or shortness of breath.   amLODipine 5 MG tablet Commonly known as: NORVASC Take 5 mg by mouth daily.   aspirin EC 81 MG tablet Take 1 tablet (81 mg total) by mouth daily. Swallow whole. Start taking on: September 21, 2022   escitalopram 20 MG tablet Commonly known as: LEXAPRO Take 20 mg by mouth daily.   folic acid 1 MG tablet Commonly known as: FOLVITE Take 1 tablet (1 mg total) by mouth daily. Start taking on: September 21, 2022   gabapentin 100 MG capsule Commonly known as: NEURONTIN Take 100 mg by mouth 3 (three) times daily.   pantoprazole 40 MG tablet Commonly known as: PROTONIX Take 40 mg by mouth daily.   sildenafil 100 MG tablet Commonly known as: VIAGRA Take 1 tablet (100 mg total) by mouth daily as needed for erectile dysfunction.   thiamine 100 MG tablet Commonly known as: VITAMIN B1 Take 100 mg by mouth 2 (two) times daily.  Discharge Exam: Filed Weights   09/17/22 1812  Weight: 84 kg   Appearance: He is not ill-appearing.  HENT:     Head: Normocephalic and atraumatic.   Eyes:     Extraocular Movements: Extraocular movements intact.  Cardiovascular:     Rate and Rhythm: Normal rate and regular rhythm.  Pulmonary:     Effort: Pulmonary effort is normal.  Abdominal:     General: Bowel sounds are normal.  Musculoskeletal:        General: Normal range of motion.  Skin:    General: Skin is warm.  Neurological: Alert and oriented x 3 Psychiatric:        Mood and Affect: Mood  normal.  Condition at discharge: good    Discharge time spent: I spent 35 minutes discharging this patient  Signed: Loyce Dys, MD [facility] 09/20/2022

## 2022-09-21 LAB — VOLATILES,BLD-ACETONE,ETHANOL,ISOPROP,METHANOL
Acetone, blood: <0.01 g/dL (ref 0.000–0.010)
Ethanol, blood: <0.01 g/dL (ref 0.000–0.010)
Isopropanol, blood: <0.01 g/dL (ref 0.000–0.010)
Methanol, blood: <0.01 g/dL (ref 0.000–0.010)

## 2022-09-22 LAB — CULTURE, BLOOD (ROUTINE X 2): Special Requests: ADEQUATE

## 2022-09-23 LAB — CULTURE, BLOOD (ROUTINE X 2)
Culture: NO GROWTH
Culture: NO GROWTH
Special Requests: ADEQUATE

## 2022-09-29 ENCOUNTER — Telehealth: Payer: Self-pay | Admitting: Urology

## 2022-09-29 NOTE — Telephone Encounter (Signed)
Dr Apolinar Junes reviewed message, patient should try OTC supplements for hot flashes. Symptoms should start improving soon. Patient advised and understood.

## 2022-09-29 NOTE — Telephone Encounter (Signed)
Pt stopped by office and says Eligard has been giving him hot flashes. He says they have been worse here lately.  He had last injection 11/2021.  He wants to speak w/someone.

## 2022-12-24 ENCOUNTER — Other Ambulatory Visit: Payer: Self-pay | Admitting: *Deleted

## 2022-12-24 DIAGNOSIS — C61 Malignant neoplasm of prostate: Secondary | ICD-10-CM

## 2022-12-25 ENCOUNTER — Telehealth: Payer: Self-pay | Admitting: Urology

## 2022-12-25 ENCOUNTER — Other Ambulatory Visit: Payer: 59

## 2022-12-25 DIAGNOSIS — C61 Malignant neoplasm of prostate: Secondary | ICD-10-CM

## 2022-12-25 NOTE — Telephone Encounter (Signed)
Spoke with patient. Sildenafil does not work, he has tried Tadalafil in the past also.  He has tried supplement OTC for sweating-could not tell me the name of it, sweating has not improved. He would like to know what to do about both issues at this time.

## 2022-12-25 NOTE — Telephone Encounter (Signed)
Patient was in office for lab appointment this morning and has some questions/concerns about Eligard injection he receives. He stated that the injection causes him to sweat all the time. He also said that Sildenafil is not working. He requested a call to discuss.

## 2022-12-25 NOTE — Telephone Encounter (Signed)
He is done with ADT so hot flashes should resolve with time.    OK to make appt with PA to discuss intra cavernosal injection vs. IPP.  Vanna Scotland, MD

## 2022-12-25 NOTE — Telephone Encounter (Signed)
Patient advised and scheduled appointment

## 2022-12-26 LAB — PSA: Prostate Specific Ag, Serum: <0.1 ng/mL (ref 0.0–4.0)

## 2023-01-13 NOTE — Progress Notes (Unsigned)
01/14/2023 12:32 PM   Edward Weber 1960-06-12 [id number]  Referring provider: Gracelyn Nurse, MD [address] [place],  [place] [address]  Urological history: 1.  Prostate cancer -PSA (12/2022) - <0.1 -IMRT w/ ADT (completed 2022)  2. ED -Contributing factors of age, prostate cancer pelvic surgery, alcoholism, depression, stroke, hypertension, sickle cell anemia, stroke, smoking cigarettes, alcohol consumption, marijuana use, cocaine use and vitiligo -failed PDE5i's   No chief complaint on file.  HPI: Sahmir Weber is a 62 y.o. male who presents today for ed.   Previous records reviewed.     Score: 1-7 Severe ED 8-11 Moderate ED 12-16 Mild-Moderate ED 17-21 Mild ED 22-25 No ED   PMH: Past Medical History:  Diagnosis Date   Alcoholism (HCC)    Allergic rhinitis    Allergic state    Anemia    Arthritis    Cancer (HCC)    Chronic hepatitis C (HCC)    Depression    Esophagitis, reflux    GERD (gastroesophageal reflux disease)    Helicobacter pylori gastritis    Hepatitis    Hypertension    Neuromuscular disorder (HCC)    Osteoarthrosis    Peripheral neuropathy    Rhabdomyolysis    Sickle cell anemia (HCC)    Stroke (HCC)    Stroke [facility])    Vitiligo     Surgical History: Past Surgical History:  Procedure Laterality Date   COLON SURGERY     COLONOSCOPY WITH PROPOFOL N/A 05/09/2015   Procedure: COLONOSCOPY WITH PROPOFOL;  Surgeon: Wallace Cullens, MD;  Location: [facility] ENDOSCOPY;  Service: Gastroenterology;  Laterality: N/A;   ESOPHAGOGASTRODUODENOSCOPY N/A 05/09/2015   Procedure: ESOPHAGOGASTRODUODENOSCOPY (EGD);  Surgeon: Wallace Cullens, MD;  Location: [facility] ENDOSCOPY;  Service: Gastroenterology;  Laterality: N/A;   ESOPHAGOGASTRODUODENOSCOPY (EGD) WITH PROPOFOL N/A 05/04/2017   Procedure: ESOPHAGOGASTRODUODENOSCOPY (EGD) WITH PROPOFOL;  Surgeon: Toledo, Boykin Nearing, MD;  Location: [facility] ENDOSCOPY;  Service: Gastroenterology;  Laterality: N/A;   FINGER  DEBRIDEMENT  1976   s/p infected dog bite   TEE WITHOUT CARDIOVERSION N/A 05/20/2012   Procedure: TRANSESOPHAGEAL ECHOCARDIOGRAM (TEE);  Surgeon: Pricilla Riffle, MD;  Location: [facility] ENDOSCOPY;  Service: Cardiovascular;  Laterality: N/A;    Home Medications:  Allergies as of 01/14/2023       Reactions   Bee Venom Anaphylaxis   Beef-derived Products    Personal preference   Lactalbumin    Milk-related Compounds Nausea And Vomiting   Intoloerance   Pegademase Bovine    Other reaction(s): Unknown Personal preference Personal preference Personal preference   Tilactase Nausea And Vomiting   Intoloerance        Medication List        Accurate as of January 13, 2023 12:32 PM. If you have any questions, ask your nurse or doctor.          albuterol 108 (90 Base) MCG/ACT inhaler Commonly known as: VENTOLIN HFA Inhale 2 puffs into the lungs daily as needed for wheezing or shortness of breath.   amLODipine 5 MG tablet Commonly known as: NORVASC Take 5 mg by mouth daily.   aspirin EC 81 MG tablet Take 1 tablet (81 mg total) by mouth daily. Swallow whole.   escitalopram 20 MG tablet Commonly known as: LEXAPRO Take 20 mg by mouth daily.   folic acid 1 MG tablet Commonly known as: FOLVITE Take 1 tablet (1 mg total) by mouth daily.   gabapentin 100 MG capsule Commonly known as: NEURONTIN  Take 100 mg by mouth 3 (three) times daily.   pantoprazole 40 MG tablet Commonly known as: PROTONIX Take 40 mg by mouth daily.   sildenafil 100 MG tablet Commonly known as: VIAGRA Take 1 tablet (100 mg total) by mouth daily as needed for erectile dysfunction.   thiamine 100 MG tablet Commonly known as: VITAMIN B1 Take 100 mg by mouth 2 (two) times daily.        Allergies:  Allergies  Allergen Reactions   Bee Venom Anaphylaxis   Beef-Derived Products     Personal preference   Lactalbumin    Milk-Related Compounds Nausea And Vomiting    Intoloerance   Pegademase Bovine      Other reaction(s): Unknown Personal preference Personal preference Personal preference    Tilactase Nausea And Vomiting    Intoloerance    Family History: No family history on file.  Social History:  reports that he has been smoking cigarettes. He has never used smokeless tobacco. He reports current alcohol use of about 9.0 standard drinks of alcohol per week. He reports current drug use. Drugs: Marijuana and Cocaine.  ROS: Pertinent ROS in HPI  Physical Exam: There were no vitals taken for this visit.  Constitutional:  Well nourished. Alert and oriented, No acute distress. HEENT: [place] AT, moist mucus membranes.  Trachea midline, no masses. Cardiovascular: No clubbing, cyanosis, or edema. Respiratory: Normal respiratory effort, no increased work of breathing. GI: Abdomen is soft, non tender, non distended, no abdominal masses. Liver and spleen not palpable.  No hernias appreciated.  Stool sample for occult testing is not indicated.   GU: No CVA tenderness.  No bladder fullness or masses.  Patient with circumcised/uncircumcised phallus. ***Foreskin easily retracted***  Urethral meatus is patent.  No penile discharge. No penile lesions or rashes. Scrotum without lesions, cysts, rashes and/or edema.  Testicles are located scrotally bilaterally. No masses are appreciated in the testicles. Left and right epididymis are normal. Rectal: Patient with  normal sphincter tone. Anus and perineum without scarring or rashes. No rectal masses are appreciated. Prostate is approximately *** grams, *** nodules are appreciated. Seminal vesicles are normal. Skin: No rashes, bruises or suspicious lesions. Lymph: No cervical or inguinal adenopathy. Neurologic: Grossly intact, no focal deficits, moving all 4 extremities. Psychiatric: Normal mood and affect.  Laboratory Data: Lab Results  Component Value Date   WBC 5.3 09/20/2022   HGB 10.8 (L) 09/20/2022   HCT 31.8 (L) 09/20/2022   MCV 93.5 09/20/2022    PLT 208 09/20/2022   Lab Results  Component Value Date   CREATININE 1.32 (H) 09/20/2022   Lab Results  Component Value Date   TSH 0.802 09/18/2022       Component Value Date/Time   CHOL 178 05/18/2012 1008   HDL 20 (L) 05/18/2012 1008   CHOLHDL 8.9 05/18/2012 1008   VLDL 71 (H) 05/18/2012 1008   LDLCALC 87 05/18/2012 1008   Lab Results  Component Value Date   AST 37 09/20/2022   Lab Results  Component Value Date   ALT 25 09/20/2022    Urinalysis    Component Value Date/Time   COLORURINE YELLOW (A) 09/17/2022 2214   APPEARANCEUR CLOUDY (A) 09/17/2022 2214   APPEARANCEUR Clear 01/17/2020 1550   LABSPEC 1.017 09/17/2022 2214   LABSPEC 1.010 04/01/2014 1721   PHURINE 5.0 09/17/2022 2214   GLUCOSEU NEGATIVE 09/17/2022 2214   GLUCOSEU Negative 04/01/2014 1721   HGBUR NEGATIVE 09/17/2022 2214   BILIRUBINUR NEGATIVE 09/17/2022 2214   BILIRUBINUR  Negative 01/17/2020 1550   BILIRUBINUR Negative 04/01/2014 1721   KETONESUR NEGATIVE 09/17/2022 2214   PROTEINUR 30 (A) 09/17/2022 2214   NITRITE NEGATIVE 09/17/2022 2214   LEUKOCYTESUR NEGATIVE 09/17/2022 2214   LEUKOCYTESUR Negative 04/01/2014 1721  I have reviewed the labs.   Pertinent Imaging: N/A  Assessment & Plan:  ***  1. ED - I explained to the patient that in order to achieve an erection it takes good functioning of the nervous system (parasympathetic and rs, sympathetic, sensory and motor), good blood flow into the erectile tissue of the penis and a desire to have sex - I explained that conditions like diabetes, hypertension, coronary artery disease, peripheral vascular disease, smoking, alcohol consumption, age, sleep apnea and BPH can diminish the ability to have an erection - I explained the ED may be a risk marker for underlying CVD and he should follow up with PCP for further studies *** - we will obtain a serum testosterone level at this time; if it is abnormal we will need to repeat the study for  confirmation *** - A recent study published in Sex Med 2018 Apr 13 revealed moderate to vigorous aerobic exercise for 40 minutes 4 times per week can decrease erectile problems caused by physical inactivity, obesity, hypertension, metabolic syndrome and/or cardiovascular diseases *** - We discussed trying a *** different PDE5 inhibitor, intra-urethral suppositories, intracavernous vasoactive drug injection therapy, vacuum erection devices, LI-ESWT and penile prosthesis implantation   2. Prostate cancer -recent PSA undetectable  -has follow up in March   No follow-ups on file.  These notes generated with voice recognition software. I apologize for typographical errors.  Cloretta Ned  [facility] [address]  [address] [place], [place] [address] [phone]

## 2023-01-14 ENCOUNTER — Ambulatory Visit: Payer: 59 | Admitting: Urology

## 2023-01-14 ENCOUNTER — Encounter: Payer: Self-pay | Admitting: Urology

## 2023-01-14 VITALS — BP 138/86 | HR 79

## 2023-01-14 DIAGNOSIS — Z8546 Personal history of malignant neoplasm of prostate: Secondary | ICD-10-CM

## 2023-01-14 DIAGNOSIS — N5235 Erectile dysfunction following radiation therapy: Secondary | ICD-10-CM | POA: Diagnosis not present

## 2023-01-14 DIAGNOSIS — C61 Malignant neoplasm of prostate: Secondary | ICD-10-CM

## 2023-01-14 MED ORDER — AMBULATORY NON FORMULARY MEDICATION
0 refills | Status: DC
Start: 2023-01-14 — End: 2024-02-22

## 2023-02-02 NOTE — Progress Notes (Unsigned)
02/04/2023 8:11 AM  [name] [birth year] [id number]   Referring provider: Gracelyn Nurse, MD [address] [facility] [place],  [place] [address]  Urological history: 1. Prostate cancer -PSA (12/2022) - <0.1 -IMRT w/ ADT (completed 2022)   2. ED -Contributing factors of age, prostate cancer pelvic surgery, alcoholism, depression, stroke, hypertension, stroke, smoking cigarettes, alcohol consumption, marijuana use, cocaine use and vitiligo -failed PDE5i's   Chief Complaint  Patient presents with   Trimix teaching   HPI: Gould Snide is a 62 y.o. male who presents today for Trimix titration.    The procedure is discussed with patient.  He is allowed to ask questions.  Questions were answered to his satisfaction.    Unfortunately, he did not bring his medication.     Assessment & Plan:    1.  Erectile dysfunction -could not proceed with appointment     Return for reschedule Trimix appointment.  Michiel Cowboy, PA-C   [facility] [address] [place], [place] [address] [phone]

## 2023-02-04 ENCOUNTER — Ambulatory Visit: Payer: 59 | Admitting: Urology

## 2023-02-04 DIAGNOSIS — Z538 Procedure and treatment not carried out for other reasons: Secondary | ICD-10-CM

## 2023-02-22 NOTE — Progress Notes (Deleted)
02/24/2023 12:08 PM   [name] [birth year] [id number]  Referring provider: Gracelyn Nurse, MD [address] [facility] [address] [facility] [address] [facility] [place],  [place] [address]  Urological history: 1. Prostate cancer -PSA (12/2022) - <0.1 -IMRT w/ ADT (completed 2022)   2. ED -Contributing factors of age, prostate cancer pelvic surgery, alcoholism, depression, stroke, hypertension, stroke, smoking cigarettes, alcohol consumption, marijuana use, cocaine use and vitiligo -failed PDE5i's   No chief complaint on file.  HPI: Edward Weber is a 62 y.o. male who presents today for Trimix titration.   Previous records reviewed.   He is no longer having spontaneous erections.  He has failed PDE 5 inhibitors.  He is no longer having spontaneous erections.  PMH: Past Medical History:  Diagnosis Date   Alcoholism (HCC)    Allergic rhinitis    Allergic state    Anemia    Arthritis    Cancer (HCC)    Chronic hepatitis C (HCC)    Depression    Esophagitis, reflux    GERD (gastroesophageal reflux disease)    Helicobacter pylori gastritis    Hepatitis    Hypertension    Neuromuscular disorder (HCC)    Osteoarthrosis    Peripheral neuropathy    Rhabdomyolysis    Sickle cell anemia (HCC)    Stroke (HCC)    Stroke [facility])    Vitiligo     Surgical History: Past Surgical History:  Procedure Laterality Date   COLON SURGERY     COLONOSCOPY WITH PROPOFOL N/A 05/09/2015   Procedure: COLONOSCOPY WITH PROPOFOL;  Surgeon: Wallace Cullens, MD;  Location: [facility] ENDOSCOPY;  Service: Gastroenterology;  Laterality: N/A;   ESOPHAGOGASTRODUODENOSCOPY N/A 05/09/2015   Procedure: ESOPHAGOGASTRODUODENOSCOPY (EGD);  Surgeon: Wallace Cullens, MD;  Location: [facility] ENDOSCOPY;  Service: Gastroenterology;  Laterality: N/A;   ESOPHAGOGASTRODUODENOSCOPY (EGD) WITH PROPOFOL N/A 05/04/2017   Procedure: ESOPHAGOGASTRODUODENOSCOPY (EGD) WITH PROPOFOL;  Surgeon: Toledo, Boykin Nearing, MD;  Location: [facility] ENDOSCOPY;  Service:  Gastroenterology;  Laterality: N/A;   FINGER DEBRIDEMENT  1976   s/p infected dog bite   TEE WITHOUT CARDIOVERSION N/A 05/20/2012   Procedure: TRANSESOPHAGEAL ECHOCARDIOGRAM (TEE);  Surgeon: Pricilla Riffle, MD;  Location: [facility] ENDOSCOPY;  Service: Cardiovascular;  Laterality: N/A;    Home Medications:  Allergies as of 02/24/2023       Reactions   Bee Venom Anaphylaxis   Beef-derived Products    Personal preference   Lactalbumin    Milk-related Compounds Nausea And Vomiting   Intoloerance   Pegademase Bovine    Other reaction(s): Unknown Personal preference Personal preference Personal preference   Tilactase Nausea And Vomiting   Intoloerance        Medication List        Accurate as of February 22, 2023 12:08 PM. If you have any questions, ask your nurse or doctor.          acetaminophen 500 MG tablet Commonly known as: TYLENOL Take by mouth.   albuterol 108 (90 Base) MCG/ACT inhaler Commonly known as: VENTOLIN HFA Inhale 2 puffs into the lungs daily as needed for wheezing or shortness of breath.   AMBULATORY NON FORMULARY MEDICATION Trimix (30/1/10)-(Pap/Phent/PGE)  Test Dose  1ml vial   Qty #3 Refills 0  [facility] [phone] Fax [phone]   amLODipine 5 MG tablet Commonly known as: NORVASC Take 5 mg by mouth daily.   aspirin EC 81 MG tablet Take 1 tablet (81 mg total) by mouth daily. Swallow whole.   escitalopram  20 MG tablet Commonly known as: LEXAPRO Take 20 mg by mouth daily.   folic acid 1 MG tablet Commonly known as: FOLVITE Take 1 tablet (1 mg total) by mouth daily.   gabapentin 100 MG capsule Commonly known as: NEURONTIN Take 100 mg by mouth 3 (three) times daily.   nicotine 7 mg/24hr patch Commonly known as: NICODERM CQ - dosed in mg/24 hr Place onto the skin. Start taking on: March 11, 2023   pantoprazole 40 MG tablet Commonly known as: PROTONIX Take 40 mg by mouth daily.   sildenafil 100 MG tablet Commonly  known as: VIAGRA Take 1 tablet (100 mg total) by mouth daily as needed for erectile dysfunction.   thiamine 100 MG tablet Commonly known as: VITAMIN B1 Take 100 mg by mouth 2 (two) times daily.        Allergies:  Allergies  Allergen Reactions   Bee Venom Anaphylaxis   Beef-Derived Products     Personal preference   Lactalbumin    Milk-Related Compounds Nausea And Vomiting    Intoloerance   Pegademase Bovine     Other reaction(s): Unknown Personal preference Personal preference Personal preference    Tilactase Nausea And Vomiting    Intoloerance    Family History: No family history on file.  Social History:  reports that he has been smoking cigarettes. He has never used smokeless tobacco. He reports current alcohol use of about 9.0 standard drinks of alcohol per week. He reports current drug use. Drugs: Marijuana and Cocaine.  ROS: Pertinent ROS in HPI  Physical Exam: There were no vitals taken for this visit.  Constitutional:  Well nourished. Alert and oriented, No acute distress. HEENT: [place]egino Ramirez AT, moist mucus membranes.  Trachea midline, no masses. Cardiovascular: No clubbing, cyanosis, or edema. Respiratory: Normal respiratory effort, no increased work of breathing. GI: Abdomen is soft, non tender, non distended, no abdominal masses. Liver and spleen not palpable.  No hernias appreciated.  Stool sample for occult testing is not indicated.   GU: No CVA tenderness.  No bladder fullness or masses.  Patient with circumcised/uncircumcised phallus. ***Foreskin easily retracted***  Urethral meatus is patent.  No penile discharge. No penile lesions or rashes. Scrotum without lesions, cysts, rashes and/or edema.  Testicles are located scrotally bilaterally. No masses are appreciated in the testicles. Left and right epididymis are normal. Rectal: Patient with  normal sphincter tone. Anus and perineum without scarring or rashes. No rectal masses are appreciated. Prostate is  approximately *** grams, *** nodules are appreciated. Seminal vesicles are normal. Skin: No rashes, bruises or suspicious lesions. Lymph: No cervical or inguinal adenopathy. Neurologic: Grossly intact, no focal deficits, moving all 4 extremities. Psychiatric: Normal mood and affect.  Laboratory Data: N/A  Pertinent Imaging: N/A  Procedure: Patient's left corpus cavernosum is identified.  An area near the base of the penis is cleansed with rubbing alcohol.  Careful to avoid the dorsal vein, *** mcg of Trimix (papaverine 30 mg, phentolamine 1 mg and prostaglandin E1 10 mcg, Lot # ***@*** exp # *** is injected at a 90 degree angle into the left *** corpus cavernosum near the base of the penis.  Patient experienced a very firm erection in 15 minutes.     Assessment & Plan:  ***  1. Severe ED *** -Advised patient of the condition of priapism, painful erection lasting for more than four hours, and to contact the office or seek treatment in the ED immediately   2. Prostate cancer -Recent PSA undetectable -  Has follow-up in March  No follow-ups on file.  These notes generated with voice recognition software. I apologize for typographical errors.  Cloretta Ned  [facility] [address]  [address] [place], [place] [address] [phone]

## 2023-02-24 ENCOUNTER — Ambulatory Visit: Payer: 59 | Admitting: Urology

## 2023-02-24 DIAGNOSIS — N5235 Erectile dysfunction following radiation therapy: Secondary | ICD-10-CM

## 2023-02-24 DIAGNOSIS — C61 Malignant neoplasm of prostate: Secondary | ICD-10-CM

## 2023-02-25 ENCOUNTER — Encounter: Payer: Self-pay | Admitting: Urology

## 2023-06-10 ENCOUNTER — Emergency Department
Admission: EM | Admit: 2023-06-10 | Discharge: 2023-06-10 | Disposition: A | Discharge status: Home / Self Care | Payer: 59 | Attending: Emergency Medicine | Admitting: Emergency Medicine

## 2023-06-10 ENCOUNTER — Other Ambulatory Visit: Payer: Self-pay

## 2023-06-10 DIAGNOSIS — Z8673 Personal history of transient ischemic attack (TIA), and cerebral infarction without residual deficits: Secondary | ICD-10-CM | POA: Insufficient documentation

## 2023-06-10 DIAGNOSIS — I10 Essential (primary) hypertension: Secondary | ICD-10-CM | POA: Diagnosis not present

## 2023-06-10 DIAGNOSIS — G8929 Other chronic pain: Secondary | ICD-10-CM | POA: Diagnosis not present

## 2023-06-10 DIAGNOSIS — M25551 Pain in right hip: Secondary | ICD-10-CM | POA: Insufficient documentation

## 2023-06-10 DIAGNOSIS — M5431 Sciatica, right side: Secondary | ICD-10-CM | POA: Diagnosis not present

## 2023-06-10 MED ORDER — DEXAMETHASONE SODIUM PHOSPHATE 10 MG/ML IJ SOLN
10.0000 mg | Freq: Once | INTRAMUSCULAR | Status: AC
  Administered 2023-06-10: 10 mg via INTRAMUSCULAR
  Filled 2023-06-10: qty 1

## 2023-06-10 MED ORDER — KETOROLAC TROMETHAMINE 15 MG/ML IJ SOLN
15.0000 mg | Freq: Once | INTRAMUSCULAR | Status: AC
  Administered 2023-06-10: 15 mg via INTRAMUSCULAR
  Filled 2023-06-10: qty 1

## 2023-06-10 NOTE — ED Provider Notes (Signed)
 [facility] Emergency Department Provider Note     Event Date/Time   First MD Initiated Contact with Patient 06/10/23 1350     (approximate)   History   Hip Pain   HPI  Dekker Verga is a 63 y.o. male with a past medical history of HTN, arthritis, stroke, neuromuscular disorder, and peripheral neuropathy presents to the ED for evaluation of right hip pain without injury.  Patient reports yesterday he was at the grocery store and could not get out of his car due to pain out of nowhere to his right hip.  3 of the same in which he states he has had a shot injected which improved the pain.  Patient denies weakness, back pain and injury.     Physical Exam   Triage Vital Signs: ED Triage Vitals  Encounter Vitals Group     BP 06/10/23 1258 135/76     Systolic BP Percentile --      Diastolic BP Percentile --      Pulse Rate 06/10/23 1258 77     Resp 06/10/23 1258 18     Temp 06/10/23 1258 98.4 F (36.9 C)     Temp Source 06/10/23 1258 Oral     SpO2 06/10/23 1258 95 %     Weight 06/10/23 1259 192 lb (87.1 kg)     Height 06/10/23 1259 5\' 8"  (1.727 m)     Head Circumference --      Peak Flow --      Pain Score 06/10/23 1259 8     Pain Loc --      Pain Education --      Exclude from Growth Chart --     Most recent vital signs: Vitals:   06/10/23 1258  BP: 135/76  Pulse: 77  Resp: 18  Temp: 98.4 F (36.9 C)  SpO2: 95%    General Awake, no distress.  HEENT NCAT. PERRL. EOMI. No rhinorrhea. Mucous membranes are moist.  CV:  Good peripheral perfusion.  RESP:  Normal effort.  ABD:  No distention.  MSK:   Full ROM in all LE joints. No swelling, deformity or tenderness.  NEURO: Cranial nerves intact. No focal deficits. Sensation and motor function intact. 5/5 muscle strength of LE. Gait is steady with assistance of a cane.  OTHER:  Negative Hoffmann sign. (+) Straight leg raise test at 45 degrees of the right lower extremity.  Palpable are  performed on pedal pulses-equal and symmetric bilaterally.    ED Results / Procedures / Treatments   Labs (all labs ordered are listed, but only abnormal results are displayed) Labs Reviewed - No data to display  No results found.  PROCEDURES:  Critical Care performed: No  Procedures   MEDICATIONS ORDERED IN ED: Medications  ketorolac (TORADOL) 15 MG/ML injection 15 mg (has no administration in time range)  dexamethasone (DECADRON) injection 10 mg (has no administration in time range)     IMPRESSION / MDM / ASSESSMENT AND PLAN / ED COURSE  I reviewed the triage vital signs and the nursing notes.                                 63 y.o. male presents to the emergency department for evaluation and treatment of acute on chronic right hip pain. See HPI for further details.   Differential diagnosis includes, but is not limited to sciatica, osteo arthritis, arterial  occlusion  Patient's presentation is most consistent with exacerbation of chronic illness.  Patient is alert and oriented.  He is hemodynamically stable.  Physical exam findings are stated above.  No injury or trauma indicating imaging at this time.  Presentation is clinically consistent with sciatica or nerve impingement.  Patient will receive a Toradol and steroid injection in the ED and is encouraged to follow-up with his PCP for further evaluation and management.  Patient stable condition at discharge.  ED return precautions discussed.  FINAL CLINICAL IMPRESSION(S) / ED DIAGNOSES   Final diagnoses:  Right hip pain  Sciatica of right side   Rx / DC Orders   ED Discharge Orders     None        Note:  This document was prepared using Dragon voice recognition software and may include unintentional dictation errors.    Romeo Apple, Yul Diana A, PA-C 06/10/23 1503    Concha Se, MD 06/11/23 [phone]

## 2023-06-10 NOTE — Discharge Instructions (Signed)
 Follow-up with your primary care provider

## 2023-06-10 NOTE — ED Triage Notes (Signed)
 Pt sts that he was at the grocery store yesterday and that is when the pain started. Pt sts that the hip did not have any injury to it.

## 2023-06-21 ENCOUNTER — Other Ambulatory Visit: Payer: Self-pay

## 2023-06-21 ENCOUNTER — Other Ambulatory Visit: Payer: 59

## 2023-06-21 DIAGNOSIS — C61 Malignant neoplasm of prostate: Secondary | ICD-10-CM

## 2023-06-21 NOTE — Addendum Note (Signed)
 Addended by: Consuella Lose on: 06/21/2023 08:50 AM   Modules accepted: Orders

## 2023-06-22 LAB — PSA: Prostate Specific Ag, Serum: <0.1 ng/mL (ref 0.0–4.0)

## 2023-06-23 ENCOUNTER — Ambulatory Visit: Payer: Self-pay | Admitting: Urology

## 2023-06-24 ENCOUNTER — Encounter: Payer: Self-pay | Admitting: Urology

## 2023-07-16 ENCOUNTER — Ambulatory Visit (INDEPENDENT_AMBULATORY_CARE_PROVIDER_SITE_OTHER): Admitting: Physician Assistant

## 2023-07-16 ENCOUNTER — Encounter: Payer: Self-pay | Admitting: Physician Assistant

## 2023-07-16 VITALS — BP 128/80 | HR 83 | Ht 68.0 in | Wt 187.0 lb

## 2023-07-16 DIAGNOSIS — N5235 Erectile dysfunction following radiation therapy: Secondary | ICD-10-CM | POA: Diagnosis not present

## 2023-07-16 DIAGNOSIS — C61 Malignant neoplasm of prostate: Secondary | ICD-10-CM

## 2023-07-16 LAB — BLADDER SCAN AMB NON-IMAGING

## 2023-07-16 NOTE — Progress Notes (Signed)
 07/16/2023 9:46 AM   [name] Jun 14, 1960 [id number]  CC: Chief Complaint  Patient presents with   Follow-up   HPI: Edward Weber is a 63 y.o. male with PMH prostate cancer s/p IMRT and ADT and ED who failed PDE 5 inhibitors who presents today for annual follow-up.   Today he reports no bothersome urinary symptoms.  He feels he is doing well with no acute concerns.  He is not particularly bothered by his ED and while he previously considered Trimix, he does not wish to pursue titration at this time.  PSA last month remained undetectable.  IPSS 0/mixed as below.  PVR 31 mL.   IPSS     Row Name 07/16/23 0900         International Prostate Symptom Score   How often have you had the sensation of not emptying your bladder? Not at All     How often have you had to urinate less than every two hours? Not at All     How often have you found you stopped and started again several times when you urinated? Not at All     How often have you found it difficult to postpone urination? Not at All     How often have you had a weak urinary stream? Not at All     How often have you had to strain to start urination? Not at All     How many times did you typically get up at night to urinate? None     Total IPSS Score 0       Quality of Life due to urinary symptoms   If you were to spend the rest of your life with your urinary condition just the way it is now how would you feel about that? Mixed               PMH: Past Medical History:  Diagnosis Date   Alcoholism (HCC)    Allergic rhinitis    Allergic state    Anemia    Arthritis    Cancer (HCC)    Chronic hepatitis C (HCC)    Depression    Esophagitis, reflux    GERD (gastroesophageal reflux disease)    Helicobacter pylori gastritis    Hepatitis    Hypertension    Neuromuscular disorder (HCC)    Osteoarthrosis    Peripheral neuropathy    Rhabdomyolysis    Sickle cell anemia (HCC)    Stroke (HCC)    Stroke  [facility])    Vitiligo     Surgical History: Past Surgical History:  Procedure Laterality Date   COLON SURGERY     COLONOSCOPY WITH PROPOFOL N/A 05/09/2015   Procedure: COLONOSCOPY WITH PROPOFOL;  Surgeon: Wallace Cullens, MD;  Location: [facility] ENDOSCOPY;  Service: Gastroenterology;  Laterality: N/A;   ESOPHAGOGASTRODUODENOSCOPY N/A 05/09/2015   Procedure: ESOPHAGOGASTRODUODENOSCOPY (EGD);  Surgeon: Wallace Cullens, MD;  Location: [facility] ENDOSCOPY;  Service: Gastroenterology;  Laterality: N/A;   ESOPHAGOGASTRODUODENOSCOPY (EGD) WITH PROPOFOL N/A 05/04/2017   Procedure: ESOPHAGOGASTRODUODENOSCOPY (EGD) WITH PROPOFOL;  Surgeon: Toledo, Boykin Nearing, MD;  Location: [facility] ENDOSCOPY;  Service: Gastroenterology;  Laterality: N/A;   FINGER DEBRIDEMENT  1976   s/p infected dog bite   TEE WITHOUT CARDIOVERSION N/A 05/20/2012   Procedure: TRANSESOPHAGEAL ECHOCARDIOGRAM (TEE);  Surgeon: Pricilla Riffle, MD;  Location: [facility] ENDOSCOPY;  Service: Cardiovascular;  Laterality: N/A;    Home Medications:  Allergies as of 07/16/2023  Reactions   Bee Venom Anaphylaxis   Beef (bovine) Protein Other (See Comments)   Personal preference   Beef-derived Drug Products    Personal preference   Lactalbumin    Milk-related Compounds Nausea And Vomiting   Intoloerance   Pegademase Bovine    Other reaction(s): Unknown Personal preference Personal preference Personal preference   Tilactase Nausea And Vomiting   Intoloerance   Milk (cow) Nausea And Vomiting   Intoloerance        Medication List        Accurate as of July 16, 2023  9:46 AM. If you have any questions, ask your nurse or doctor.          acetaminophen 500 MG tablet Commonly known as: TYLENOL Take by mouth.   albuterol 108 (90 Base) MCG/ACT inhaler Commonly known as: VENTOLIN HFA Inhale 2 puffs into the lungs daily as needed for wheezing or shortness of breath.   AMBULATORY NON FORMULARY MEDICATION Trimix (30/1/10)-(Pap/Phent/PGE)  Test Dose  1ml vial    Qty #3 Refills 0  [facility] [phone] Fax [phone]   amLODipine 5 MG tablet Commonly known as: NORVASC Take 5 mg by mouth daily.   aspirin EC 81 MG tablet Take 1 tablet (81 mg total) by mouth daily. Swallow whole.   escitalopram 20 MG tablet Commonly known as: LEXAPRO Take 20 mg by mouth daily.   folic acid 1 MG tablet Commonly known as: FOLVITE Take 1 tablet (1 mg total) by mouth daily.   gabapentin 100 MG capsule Commonly known as: NEURONTIN Take 100 mg by mouth 3 (three) times daily.   pantoprazole 40 MG tablet Commonly known as: PROTONIX Take 40 mg by mouth daily.   sildenafil 100 MG tablet Commonly known as: VIAGRA Take 1 tablet (100 mg total) by mouth daily as needed for erectile dysfunction.   triamcinolone ointment 0.1 % Commonly known as: KENALOG Apply topically.        Allergies:  Allergies  Allergen Reactions   Bee Venom Anaphylaxis   Beef (Bovine) Protein Other (See Comments)    Personal preference   Beef-Derived Drug Products     Personal preference   Lactalbumin    Milk-Related Compounds Nausea And Vomiting    Intoloerance   Pegademase Bovine     Other reaction(s): Unknown Personal preference Personal preference Personal preference    Tilactase Nausea And Vomiting    Intoloerance   Milk (Cow) Nausea And Vomiting    Intoloerance    Family History: No family history on file.  Social History:   reports that he has been smoking cigarettes. He has never used smokeless tobacco. He reports current alcohol use of about 9.0 standard drinks of alcohol per week. He reports current drug use. Drugs: Marijuana and Cocaine.  Physical Exam: BP 128/80   Pulse 83   Ht 5\' 8"  (1.727 m)   Wt 187 lb (84.8 kg)   BMI 28.43 kg/m   Constitutional:  Alert and oriented, no acute distress, nontoxic appearing HEENT: [place], AT Cardiovascular: No clubbing, cyanosis, or edema Respiratory: Normal respiratory effort, no increased work of  breathing Skin: No rashes, bruises or suspicious lesions Neurologic: Grossly intact, no focal deficits, moving all 4 extremities Psychiatric: Normal mood and affect  Laboratory Data: Results for orders placed or performed in visit on 07/16/23  BLADDER SCAN AMB NON-IMAGING   Collection Time: 07/16/23  9:45 AM  Result Value Ref Range   Scan Result 31ml    Assessment & Plan:  1. Prostate cancer (HCC) (Primary) PSA remains undetectable.  He is feeling well with no acute concerns.  Will continue with PSA monitoring every 6 months for now. - BLADDER SCAN AMB NON-IMAGING - PSA Total (Reflex To Free); Future  2. Erectile dysfunction following radiation therapy Not particularly bothersome.  Will defer Trimix titration for now but may revisit in the future per patient request.  Return in about 6 months (around 01/15/2024) for Lab visit for PSA +1-year annual prostate cancer follow-up with PSA prior.  Carman Ching, PA-C  [facility] [address], [address] [place], [place] [address] [phone]

## 2023-08-24 ENCOUNTER — Ambulatory Visit
Admission: RE | Admit: 2023-08-24 | Discharge: 2023-08-24 | Disposition: A | Discharge status: Home / Self Care | Source: Ambulatory Visit | Attending: Family Medicine | Admitting: Family Medicine

## 2023-08-24 ENCOUNTER — Other Ambulatory Visit: Payer: Self-pay | Admitting: Family Medicine

## 2023-08-24 DIAGNOSIS — M4807 Spinal stenosis, lumbosacral region: Secondary | ICD-10-CM

## 2023-08-25 ENCOUNTER — Ambulatory Visit: Admitting: Urology

## 2023-09-15 ENCOUNTER — Telehealth: Payer: Self-pay | Admitting: Neurosurgery

## 2023-09-15 NOTE — Telephone Encounter (Signed)
 Opened encounter in error

## 2023-11-29 ENCOUNTER — Encounter: Payer: Self-pay | Admitting: Urology

## 2023-12-19 ENCOUNTER — Other Ambulatory Visit: Payer: Self-pay

## 2023-12-19 ENCOUNTER — Emergency Department
Admission: EM | Admit: 2023-12-19 | Discharge: 2023-12-19 | Discharge status: Against Medical Advice | Attending: Emergency Medicine | Admitting: Emergency Medicine

## 2023-12-19 DIAGNOSIS — Z5321 Procedure and treatment not carried out due to patient leaving prior to being seen by health care provider: Secondary | ICD-10-CM | POA: Diagnosis not present

## 2023-12-19 DIAGNOSIS — M109 Gout, unspecified: Secondary | ICD-10-CM | POA: Diagnosis not present

## 2023-12-19 DIAGNOSIS — M2550 Pain in unspecified joint: Secondary | ICD-10-CM | POA: Diagnosis present

## 2023-12-19 HISTORY — DX: Gout, unspecified: M10.9

## 2023-12-19 NOTE — ED Triage Notes (Signed)
 Patient states gout flare since Friday night.

## 2023-12-24 DIAGNOSIS — Z5321 Procedure and treatment not carried out due to patient leaving prior to being seen by health care provider: Secondary | ICD-10-CM | POA: Insufficient documentation

## 2023-12-24 DIAGNOSIS — M109 Gout, unspecified: Secondary | ICD-10-CM | POA: Diagnosis present

## 2023-12-24 NOTE — ED Notes (Signed)
 BIB [facility] from home for gout pain x 1 week. Pt states it is in all of his joints.  171/97 96% RA  81 HR

## 2023-12-24 NOTE — ED Triage Notes (Signed)
 Patient brought in by EMS for gout flare up x3-4 days, current pain 10/10 to bil ankles. Seen previously for same 6 days ago. AxOx4, pt states he's ambulatory with baseline cane/walker.

## 2023-12-25 ENCOUNTER — Other Ambulatory Visit: Payer: Self-pay

## 2023-12-25 ENCOUNTER — Emergency Department
Admission: EM | Admit: 2023-12-25 | Discharge: 2023-12-25 | Discharge status: Against Medical Advice | Attending: Emergency Medicine | Admitting: Emergency Medicine

## 2023-12-25 NOTE — ED Provider Notes (Signed)
-----------------------------------------   5:33 AM on 12/25/2023 -----------------------------------------  I sent it for the patient but unfortunately due to overwhelming ED volume tonight with substantially more acute patients, I was unable to get into the room to see him before he left without being seen.  Of note, he was able to ambulate out on his own without requiring any assistance.   Gordan Huxley, MD 12/25/23 [phone]

## 2023-12-25 NOTE — ED Notes (Signed)
 Pt visualized ambulating down the hall, pt states his sister is coming to get him and that he is going to [facility].

## 2023-12-27 ENCOUNTER — Other Ambulatory Visit: Payer: Self-pay

## 2023-12-27 ENCOUNTER — Emergency Department
Admission: EM | Admit: 2023-12-27 | Discharge: 2023-12-27 | Discharge status: Against Medical Advice | Attending: Emergency Medicine | Admitting: Emergency Medicine

## 2023-12-27 DIAGNOSIS — Z5321 Procedure and treatment not carried out due to patient leaving prior to being seen by health care provider: Secondary | ICD-10-CM | POA: Diagnosis not present

## 2023-12-27 DIAGNOSIS — M79606 Pain in leg, unspecified: Secondary | ICD-10-CM | POA: Insufficient documentation

## 2023-12-27 NOTE — ED Triage Notes (Signed)
 Patient wheeled to triage with complaints of continued gout flare up in legs and ankles. Was seen for same 2 days ago. States he is not currently on gout medication. Did not take anything OTC prior to arrival.

## 2024-01-05 ENCOUNTER — Emergency Department
Admission: EM | Admit: 2024-01-05 | Discharge: 2024-01-05 | Disposition: A | Discharge status: Home / Self Care | Attending: Emergency Medicine | Admitting: Emergency Medicine

## 2024-01-05 ENCOUNTER — Other Ambulatory Visit: Payer: Self-pay

## 2024-01-05 ENCOUNTER — Emergency Department

## 2024-01-05 ENCOUNTER — Encounter: Payer: Self-pay | Admitting: Emergency Medicine

## 2024-01-05 DIAGNOSIS — E876 Hypokalemia: Secondary | ICD-10-CM | POA: Insufficient documentation

## 2024-01-05 DIAGNOSIS — I129 Hypertensive chronic kidney disease with stage 1 through stage 4 chronic kidney disease, or unspecified chronic kidney disease: Secondary | ICD-10-CM | POA: Insufficient documentation

## 2024-01-05 DIAGNOSIS — N189 Chronic kidney disease, unspecified: Secondary | ICD-10-CM | POA: Diagnosis not present

## 2024-01-05 DIAGNOSIS — R0602 Shortness of breath: Secondary | ICD-10-CM | POA: Diagnosis present

## 2024-01-05 DIAGNOSIS — F17210 Nicotine dependence, cigarettes, uncomplicated: Secondary | ICD-10-CM | POA: Insufficient documentation

## 2024-01-05 DIAGNOSIS — J9801 Acute bronchospasm: Secondary | ICD-10-CM | POA: Insufficient documentation

## 2024-01-05 DIAGNOSIS — D72829 Elevated white blood cell count, unspecified: Secondary | ICD-10-CM | POA: Insufficient documentation

## 2024-01-05 LAB — CBC
HCT: 40.1 % (ref 39.0–52.0)
Hemoglobin: 13.3 g/dL (ref 13.0–17.0)
MCH: 31.3 pg (ref 26.0–34.0)
MCHC: 33.2 g/dL (ref 30.0–36.0)
MCV: 94.4 fL (ref 80.0–100.0)
Platelets: 287 K/uL (ref 150–400)
RBC: 4.25 MIL/uL (ref 4.22–5.81)
RDW: 13.5 % (ref 11.5–15.5)
WBC: 12 K/uL — ABNORMAL HIGH (ref 4.0–10.5)
nRBC: 0 % (ref 0.0–0.2)

## 2024-01-05 LAB — BASIC METABOLIC PANEL WITH GFR
Anion gap: 16 — ABNORMAL HIGH (ref 5–15)
BUN: 19 mg/dL (ref 8–23)
CO2: 27 mmol/L (ref 22–32)
Calcium: 8.8 mg/dL — ABNORMAL LOW (ref 8.9–10.3)
Chloride: 92 mmol/L — ABNORMAL LOW (ref 98–111)
Creatinine, Ser: 1.23 mg/dL (ref 0.61–1.24)
GFR, Estimated: >60 mL/min (ref >60)
Glucose, Bld: 201 mg/dL — ABNORMAL HIGH (ref 70–99)
Potassium: 3.1 mmol/L — ABNORMAL LOW (ref 3.5–5.1)
Sodium: 135 mmol/L (ref 135–145)

## 2024-01-05 LAB — RESP PANEL BY RT-PCR (RSV, FLU A&B, COVID)  RVPGX2
Influenza A by PCR: NEGATIVE
Influenza B by PCR: NEGATIVE
Resp Syncytial Virus by PCR: NEGATIVE
SARS Coronavirus 2 by RT PCR: NEGATIVE

## 2024-01-05 MED ORDER — IPRATROPIUM-ALBUTEROL 0.5-2.5 (3) MG/3ML IN SOLN
3.0000 mL | Freq: Once | RESPIRATORY_TRACT | Status: AC
  Administered 2024-01-05: 3 mL via RESPIRATORY_TRACT
  Filled 2024-01-05: qty 3

## 2024-01-05 MED ORDER — PREDNISONE 20 MG PO TABS
60.0000 mg | ORAL_TABLET | Freq: Once | ORAL | Status: AC
  Administered 2024-01-05: 60 mg via ORAL
  Filled 2024-01-05: qty 3

## 2024-01-05 MED ORDER — PREDNISONE 50 MG PO TABS
50.0000 mg | ORAL_TABLET | Freq: Every day | ORAL | 0 refills | Status: DC
Start: 2024-01-05 — End: 2024-02-22

## 2024-01-05 MED ORDER — IPRATROPIUM-ALBUTEROL 0.5-2.5 (3) MG/3ML IN SOLN
3.0000 mL | Freq: Once | RESPIRATORY_TRACT | Status: AC
Start: 2024-01-05 — End: 2024-01-05
  Administered 2024-01-05: 3 mL via RESPIRATORY_TRACT
  Filled 2024-01-05: qty 3

## 2024-01-05 NOTE — ED Notes (Signed)
 Patient was provided a wheelchair when leaving.

## 2024-01-05 NOTE — ED Notes (Signed)
 AMA form signed on paper.

## 2024-01-05 NOTE — ED Provider Notes (Signed)
 [facility] Provider Note    Event Date/Time   First MD Initiated Contact with Patient 01/05/24 1334     (approximate)   History   Shortness of Breath   HPI  Edward Weber is a 63 y.o. male with history of stroke, alcohol abuse, hypertension, chronic kidney disease who presents with complaints of shortness of breath.  He reports he found it hard to breathe last night.  He does smoke cigarettes.  Denies fevers or chills     Physical Exam   Triage Vital Signs: ED Triage Vitals  Encounter Vitals Group     BP 01/05/24 1205 (!) 163/84     Girls Systolic BP Percentile --      Girls Diastolic BP Percentile --      Boys Systolic BP Percentile --      Boys Diastolic BP Percentile --      Pulse Rate 01/05/24 1205 79     Resp 01/05/24 1205 17     Temp 01/05/24 1205 97.6 F (36.4 C)     Temp Source 01/05/24 1205 Oral     SpO2 01/05/24 1205 94 %     Weight 01/05/24 1204 86.2 kg (190 lb)     Height 01/05/24 1204 1.74 m (5' 8.5)     Head Circumference --      Peak Flow --      Pain Score 01/05/24 1204 6     Pain Loc --      Pain Education --      Exclude from Growth Chart --     Most recent vital signs: Vitals:   01/05/24 1205  BP: (!) 163/84  Pulse: 79  Resp: 17  Temp: 97.6 F (36.4 C)  SpO2: 94%     General: Awake, no distress.  CV:  Good peripheral perfusion.  Resp:  Normal effort.  End expiratory wheezing noted, no rales Abd:  No distention.  Other:  No calf pain or swelling   ED Results / Procedures / Treatments   Labs (all labs ordered are listed, but only abnormal results are displayed) Labs Reviewed  BASIC METABOLIC PANEL WITH GFR - Abnormal; Notable for the following components:      Result Value   Potassium 3.1 (*)    Chloride 92 (*)    Glucose, Bld 201 (*)    Calcium  8.8 (*)    Anion gap 16 (*)    All other components within normal limits  CBC - Abnormal; Notable for the following components:   WBC 12.0 (*)     All other components within normal limits  RESP PANEL BY RT-PCR (RSV, FLU A&B, COVID)  [id number]     EKG  ED ECG REPORT I, Lamar Price, the attending physician, personally viewed and interpreted this ECG.  Date: 01/05/2024  Rhythm: normal sinus rhythm QRS Axis: normal Intervals: normal ST/T Wave abnormalities: Nonspecific changes Narrative Interpretation: no evidence of acute ischemia    RADIOLOGY Chest x-ray view interpret by me, no pneumonia    PROCEDURES:  Critical Care performed:   Procedures   MEDICATIONS ORDERED IN ED: Medications  ipratropium-albuterol  (DUONEB) 0.5-2.5 (3) MG/3ML nebulizer solution 3 mL (3 mLs Nebulization Given 01/05/24 1428)  ipratropium-albuterol  (DUONEB) 0.5-2.5 (3) MG/3ML nebulizer solution 3 mL (3 mLs Nebulization Given 01/05/24 1428)  predniSONE  (DELTASONE ) tablet 60 mg (60 mg Oral Given 01/05/24 1427)     IMPRESSION / MDM / ASSESSMENT AND PLAN / ED COURSE  I reviewed the triage vital  signs and the nursing notes. Patient's presentation is most consistent with acute presentation with potential threat to life or bodily function.  Patient presents with shortness of breath as detailed above, overall well-appearing and in no acute distress, oxygen saturations in the mid 90s.  He does have expiratory wheezing on exam, suspect bronchospasm/COPD as a cause of his shortness of breath, pneumonia is also on the differential.  Pending labs and chest x-ray  Lab work is overall reassuring, chest x-ray without evidence of pneumonia or pneumothorax.  Patient treated with DuoNebs and prednisone  and then decided to leave AMA prior to treatment completion, I gather that he is feeling much better  Patient is leaving AGAINST MEDICAL ADVICE, he has decisional capacity      FINAL CLINICAL IMPRESSION(S) / ED DIAGNOSES   Final diagnoses:  Bronchospasm     Rx / DC Orders   ED Discharge Orders          Ordered    predniSONE  (DELTASONE ) 50 MG tablet   Daily with breakfast        01/05/24 1447             Note:  This document was prepared using Dragon voice recognition software and may include unintentional dictation errors.   Arlander Charleston, MD 01/05/24 1450

## 2024-01-05 NOTE — ED Notes (Signed)
 Patient requesting to leave. RN educated patient on importance of waiting on MD to come back and re-assess patient. Patient refused but agreed to sign AMA paper.

## 2024-01-05 NOTE — ED Triage Notes (Signed)
 Patient to ED from [facility] for HTN, SOB, and sweats/fatigue. Pt SOB started last night. Speaking in full sentences without difficulty. C/o headache  150/100 at [facility]

## 2024-01-17 ENCOUNTER — Other Ambulatory Visit

## 2024-02-04 ENCOUNTER — Inpatient Hospital Stay
Admission: EM | Admit: 2024-02-04 | Discharge: 2024-02-22 | DRG: 896 | Disposition: A | Discharge status: Skilled Nursing Facility | Attending: Internal Medicine | Admitting: Internal Medicine

## 2024-02-04 ENCOUNTER — Other Ambulatory Visit: Payer: Self-pay

## 2024-02-04 ENCOUNTER — Encounter: Payer: Self-pay | Admitting: Internal Medicine

## 2024-02-04 ENCOUNTER — Emergency Department

## 2024-02-04 ENCOUNTER — Observation Stay: Admit: 2024-02-04

## 2024-02-04 DIAGNOSIS — F17210 Nicotine dependence, cigarettes, uncomplicated: Secondary | ICD-10-CM | POA: Diagnosis present

## 2024-02-04 DIAGNOSIS — G9341 Metabolic encephalopathy: Secondary | ICD-10-CM | POA: Diagnosis present

## 2024-02-04 DIAGNOSIS — F10231 Alcohol dependence with withdrawal delirium: Secondary | ICD-10-CM | POA: Diagnosis present

## 2024-02-04 DIAGNOSIS — I639 Cerebral infarction, unspecified: Secondary | ICD-10-CM | POA: Diagnosis not present

## 2024-02-04 DIAGNOSIS — Z8673 Personal history of transient ischemic attack (TIA), and cerebral infarction without residual deficits: Secondary | ICD-10-CM | POA: Diagnosis not present

## 2024-02-04 DIAGNOSIS — I9789 Other postprocedural complications and disorders of the circulatory system, not elsewhere classified: Secondary | ICD-10-CM | POA: Diagnosis not present

## 2024-02-04 DIAGNOSIS — E1165 Type 2 diabetes mellitus with hyperglycemia: Secondary | ICD-10-CM | POA: Diagnosis present

## 2024-02-04 DIAGNOSIS — J13 Pneumonia due to Streptococcus pneumoniae: Secondary | ICD-10-CM | POA: Diagnosis not present

## 2024-02-04 DIAGNOSIS — E538 Deficiency of other specified B group vitamins: Secondary | ICD-10-CM | POA: Diagnosis not present

## 2024-02-04 DIAGNOSIS — N179 Acute kidney failure, unspecified: Secondary | ICD-10-CM | POA: Diagnosis present

## 2024-02-04 DIAGNOSIS — I10 Essential (primary) hypertension: Secondary | ICD-10-CM | POA: Diagnosis present

## 2024-02-04 DIAGNOSIS — I6340 Cerebral infarction due to embolism of unspecified cerebral artery: Secondary | ICD-10-CM | POA: Diagnosis not present

## 2024-02-04 DIAGNOSIS — I82621 Acute embolism and thrombosis of deep veins of right upper extremity: Secondary | ICD-10-CM | POA: Diagnosis not present

## 2024-02-04 DIAGNOSIS — B953 Streptococcus pneumoniae as the cause of diseases classified elsewhere: Secondary | ICD-10-CM | POA: Diagnosis present

## 2024-02-04 DIAGNOSIS — Z91030 Bee allergy status: Secondary | ICD-10-CM

## 2024-02-04 DIAGNOSIS — Z91011 Allergy to milk products, unspecified: Secondary | ICD-10-CM

## 2024-02-04 DIAGNOSIS — K219 Gastro-esophageal reflux disease without esophagitis: Secondary | ICD-10-CM | POA: Diagnosis present

## 2024-02-04 DIAGNOSIS — K7030 Alcoholic cirrhosis of liver without ascites: Secondary | ICD-10-CM | POA: Diagnosis present

## 2024-02-04 DIAGNOSIS — E66811 Obesity, class 1: Secondary | ICD-10-CM | POA: Diagnosis present

## 2024-02-04 DIAGNOSIS — R079 Chest pain, unspecified: Principal | ICD-10-CM | POA: Diagnosis present

## 2024-02-04 DIAGNOSIS — F10931 Alcohol use, unspecified with withdrawal delirium: Secondary | ICD-10-CM | POA: Diagnosis not present

## 2024-02-04 DIAGNOSIS — E119 Type 2 diabetes mellitus without complications: Secondary | ICD-10-CM | POA: Diagnosis not present

## 2024-02-04 DIAGNOSIS — J9601 Acute respiratory failure with hypoxia: Secondary | ICD-10-CM | POA: Diagnosis not present

## 2024-02-04 DIAGNOSIS — Z6830 Body mass index (BMI) 30.0-30.9, adult: Secondary | ICD-10-CM

## 2024-02-04 DIAGNOSIS — Z1152 Encounter for screening for COVID-19: Secondary | ICD-10-CM | POA: Diagnosis not present

## 2024-02-04 DIAGNOSIS — R451 Restlessness and agitation: Secondary | ICD-10-CM | POA: Diagnosis present

## 2024-02-04 DIAGNOSIS — I82601 Acute embolism and thrombosis of unspecified veins of right upper extremity: Secondary | ICD-10-CM | POA: Diagnosis not present

## 2024-02-04 DIAGNOSIS — G40909 Epilepsy, unspecified, not intractable, without status epilepticus: Secondary | ICD-10-CM | POA: Diagnosis present

## 2024-02-04 DIAGNOSIS — Y828 Other medical devices associated with adverse incidents: Secondary | ICD-10-CM | POA: Diagnosis not present

## 2024-02-04 DIAGNOSIS — R296 Repeated falls: Secondary | ICD-10-CM | POA: Diagnosis present

## 2024-02-04 DIAGNOSIS — F1410 Cocaine abuse, uncomplicated: Secondary | ICD-10-CM | POA: Diagnosis present

## 2024-02-04 DIAGNOSIS — A419 Sepsis, unspecified organism: Secondary | ICD-10-CM | POA: Diagnosis not present

## 2024-02-04 DIAGNOSIS — R293 Abnormal posture: Secondary | ICD-10-CM | POA: Diagnosis not present

## 2024-02-04 DIAGNOSIS — Z79899 Other long term (current) drug therapy: Secondary | ICD-10-CM

## 2024-02-04 DIAGNOSIS — F32A Depression, unspecified: Secondary | ICD-10-CM | POA: Diagnosis present

## 2024-02-04 DIAGNOSIS — F1010 Alcohol abuse, uncomplicated: Secondary | ICD-10-CM | POA: Diagnosis not present

## 2024-02-04 DIAGNOSIS — E1140 Type 2 diabetes mellitus with diabetic neuropathy, unspecified: Secondary | ICD-10-CM | POA: Diagnosis present

## 2024-02-04 DIAGNOSIS — R55 Syncope and collapse: Secondary | ICD-10-CM | POA: Diagnosis present

## 2024-02-04 DIAGNOSIS — M6282 Rhabdomyolysis: Secondary | ICD-10-CM | POA: Diagnosis present

## 2024-02-04 DIAGNOSIS — D571 Sickle-cell disease without crisis: Secondary | ICD-10-CM | POA: Diagnosis not present

## 2024-02-04 DIAGNOSIS — Z7982 Long term (current) use of aspirin: Secondary | ICD-10-CM

## 2024-02-04 DIAGNOSIS — F10139 Alcohol abuse with withdrawal, unspecified: Secondary | ICD-10-CM | POA: Diagnosis not present

## 2024-02-04 DIAGNOSIS — J690 Pneumonitis due to inhalation of food and vomit: Secondary | ICD-10-CM | POA: Diagnosis present

## 2024-02-04 DIAGNOSIS — R569 Unspecified convulsions: Secondary | ICD-10-CM | POA: Diagnosis not present

## 2024-02-04 DIAGNOSIS — F10288 Alcohol dependence with other alcohol-induced disorder: Secondary | ICD-10-CM | POA: Diagnosis not present

## 2024-02-04 DIAGNOSIS — K709 Alcoholic liver disease, unspecified: Secondary | ICD-10-CM | POA: Diagnosis present

## 2024-02-04 DIAGNOSIS — J440 Chronic obstructive pulmonary disease with acute lower respiratory infection: Secondary | ICD-10-CM | POA: Diagnosis present

## 2024-02-04 DIAGNOSIS — B182 Chronic viral hepatitis C: Secondary | ICD-10-CM | POA: Diagnosis present

## 2024-02-04 DIAGNOSIS — F419 Anxiety disorder, unspecified: Secondary | ICD-10-CM | POA: Diagnosis present

## 2024-02-04 DIAGNOSIS — R410 Disorientation, unspecified: Secondary | ICD-10-CM | POA: Diagnosis not present

## 2024-02-04 DIAGNOSIS — R4182 Altered mental status, unspecified: Secondary | ICD-10-CM | POA: Diagnosis not present

## 2024-02-04 DIAGNOSIS — M4802 Spinal stenosis, cervical region: Secondary | ICD-10-CM | POA: Diagnosis present

## 2024-02-04 DIAGNOSIS — R5381 Other malaise: Secondary | ICD-10-CM | POA: Diagnosis present

## 2024-02-04 DIAGNOSIS — F1210 Cannabis abuse, uncomplicated: Secondary | ICD-10-CM | POA: Diagnosis present

## 2024-02-04 DIAGNOSIS — I77819 Aortic ectasia, unspecified site: Secondary | ICD-10-CM | POA: Diagnosis present

## 2024-02-04 DIAGNOSIS — Z91014 Allergy to mammalian meats: Secondary | ICD-10-CM

## 2024-02-04 DIAGNOSIS — F1990 Other psychoactive substance use, unspecified, uncomplicated: Secondary | ICD-10-CM | POA: Diagnosis not present

## 2024-02-04 DIAGNOSIS — M109 Gout, unspecified: Secondary | ICD-10-CM | POA: Diagnosis present

## 2024-02-04 DIAGNOSIS — E512 Wernicke's encephalopathy: Secondary | ICD-10-CM | POA: Diagnosis not present

## 2024-02-04 LAB — URINE DRUG SCREEN, QUALITATIVE (ARMC ONLY)
Amphetamines, Ur Screen: NOT DETECTED
Barbiturates, Ur Screen: NOT DETECTED
Benzodiazepine, Ur Scrn: NOT DETECTED
Cannabinoid 50 Ng, Ur ~~LOC~~: POSITIVE — AB
Cocaine Metabolite,Ur ~~LOC~~: NOT DETECTED
MDMA (Ecstasy)Ur Screen: NOT DETECTED
Methadone Scn, Ur: NOT DETECTED
Opiate, Ur Screen: POSITIVE — AB
Phencyclidine (PCP) Ur S: NOT DETECTED
Tricyclic, Ur Screen: NOT DETECTED

## 2024-02-04 LAB — CBC WITH DIFFERENTIAL/PLATELET
Abs Immature Granulocytes: 0.06 K/uL (ref 0.00–0.07)
Basophils Absolute: 0 K/uL (ref 0.0–0.1)
Basophils Relative: 0 %
Eosinophils Absolute: 0.1 K/uL (ref 0.0–0.5)
Eosinophils Relative: 1 %
HCT: 34.9 % — ABNORMAL LOW (ref 39.0–52.0)
Hemoglobin: 11.5 g/dL — ABNORMAL LOW (ref 13.0–17.0)
Immature Granulocytes: 1 %
Lymphocytes Relative: 16 %
Lymphs Abs: 1.2 K/uL (ref 0.7–4.0)
MCH: 31.1 pg (ref 26.0–34.0)
MCHC: 33 g/dL (ref 30.0–36.0)
MCV: 94.3 fL (ref 80.0–100.0)
Monocytes Absolute: 0.5 K/uL (ref 0.1–1.0)
Monocytes Relative: 7 %
Neutro Abs: 5.5 K/uL (ref 1.7–7.7)
Neutrophils Relative %: 75 %
Platelets: 166 K/uL (ref 150–400)
RBC: 3.7 MIL/uL — ABNORMAL LOW (ref 4.22–5.81)
RDW: 14.1 % (ref 11.5–15.5)
WBC: 7.3 K/uL (ref 4.0–10.5)
nRBC: 0 % (ref 0.0–0.2)

## 2024-02-04 LAB — URINALYSIS, ROUTINE W REFLEX MICROSCOPIC
Bacteria, UA: NONE SEEN
Bilirubin Urine: NEGATIVE
Glucose, UA: NEGATIVE mg/dL
Hgb urine dipstick: NEGATIVE
Ketones, ur: NEGATIVE mg/dL
Leukocytes,Ua: NEGATIVE
Nitrite: NEGATIVE
Protein, ur: 100 mg/dL — AB
Specific Gravity, Urine: 1.021 (ref 1.005–1.030)
pH: 7 (ref 5.0–8.0)

## 2024-02-04 LAB — BRAIN NATRIURETIC PEPTIDE: B Natriuretic Peptide: 81.8 pg/mL (ref 0.0–100.0)

## 2024-02-04 LAB — TROPONIN I (HIGH SENSITIVITY)
Troponin I (High Sensitivity): 17 ng/L (ref <18)
Troponin I (High Sensitivity): 14 ng/L (ref <18)

## 2024-02-04 LAB — COMPREHENSIVE METABOLIC PANEL WITH GFR
ALT: 22 U/L (ref 0–44)
AST: 28 U/L (ref 15–41)
Albumin: 4 g/dL (ref 3.5–5.0)
Alkaline Phosphatase: 44 U/L (ref 38–126)
Anion gap: 11 (ref 5–15)
BUN: 12 mg/dL (ref 8–23)
CO2: 24 mmol/L (ref 22–32)
Calcium: 8.5 mg/dL — ABNORMAL LOW (ref 8.9–10.3)
Chloride: 106 mmol/L (ref 98–111)
Creatinine, Ser: 1.01 mg/dL (ref 0.61–1.24)
GFR, Estimated: >60 mL/min (ref >60)
Glucose, Bld: 166 mg/dL — ABNORMAL HIGH (ref 70–99)
Potassium: 4 mmol/L (ref 3.5–5.1)
Sodium: 141 mmol/L (ref 135–145)
Total Bilirubin: 0.7 mg/dL (ref 0.0–1.2)
Total Protein: 6.9 g/dL (ref 6.5–8.1)

## 2024-02-04 LAB — HEMOGLOBIN A1C
Hgb A1c MFr Bld: 8.1 % — ABNORMAL HIGH (ref 4.8–5.6)
Mean Plasma Glucose: 185.77 mg/dL

## 2024-02-04 LAB — RESP PANEL BY RT-PCR (RSV, FLU A&B, COVID)  RVPGX2
Influenza A by PCR: NEGATIVE
Influenza B by PCR: NEGATIVE
Resp Syncytial Virus by PCR: NEGATIVE
SARS Coronavirus 2 by RT PCR: NEGATIVE

## 2024-02-04 LAB — TSH: TSH: 0.844 u[IU]/mL (ref 0.350–4.500)

## 2024-02-04 MED ORDER — LORAZEPAM 2 MG/ML IJ SOLN
2.0000 mg | Freq: Once | INTRAMUSCULAR | Status: AC
  Administered 2024-02-04: 2 mg via INTRAVENOUS
  Filled 2024-02-04: qty 1

## 2024-02-04 MED ORDER — SODIUM CHLORIDE 0.9 % IV SOLN: 250.0000 mL | INTRAVENOUS | Status: DC | PRN

## 2024-02-04 MED ORDER — HALOPERIDOL LACTATE 5 MG/ML IJ SOLN: 2.0000 mg | Freq: Four times a day (QID) | INTRAMUSCULAR | Status: DC | PRN

## 2024-02-04 MED ORDER — THIAMINE HCL 100 MG/ML IJ SOLN: 100.0000 mg | Freq: Once | INTRAMUSCULAR | Status: DC

## 2024-02-04 MED ORDER — ONDANSETRON HCL 4 MG PO TABS
4.0000 mg | ORAL_TABLET | Freq: Four times a day (QID) | ORAL | Status: DC | PRN
Start: 2024-02-04 — End: 2024-02-07

## 2024-02-04 MED ORDER — LORAZEPAM 2 MG/ML IJ SOLN
1.0000 mg | INTRAMUSCULAR | Status: DC | PRN
  Administered 2024-02-04: 1 mg via INTRAVENOUS
  Administered 2024-02-04 (×2): 3 mg via INTRAVENOUS
  Administered 2024-02-05: 2 mg via INTRAVENOUS
  Administered 2024-02-05: 1 mg via INTRAVENOUS
  Administered 2024-02-05: 2 mg via INTRAVENOUS
  Administered 2024-02-05: 4 mg via INTRAVENOUS
  Administered 2024-02-05 – 2024-02-06 (×5): 2 mg via INTRAVENOUS
  Filled 2024-02-04 (×7): qty 1
  Filled 2024-02-04: qty 2
  Filled 2024-02-04: qty 1
  Filled 2024-02-04: qty 2
  Filled 2024-02-04: qty 1
  Filled 2024-02-04: qty 2

## 2024-02-04 MED ORDER — LORAZEPAM 1 MG PO TABS: 1.0000 mg | ORAL_TABLET | Freq: Four times a day (QID) | ORAL | Status: DC | PRN

## 2024-02-04 MED ORDER — ACETAMINOPHEN 650 MG RE SUPP: 650.0000 mg | Freq: Four times a day (QID) | RECTAL | Status: DC | PRN

## 2024-02-04 MED ORDER — HYDROXYZINE HCL 25 MG PO TABS: 25.0000 mg | ORAL_TABLET | Freq: Four times a day (QID) | ORAL | Status: DC | PRN

## 2024-02-04 MED ORDER — THIAMINE MONONITRATE 100 MG PO TABS
100.0000 mg | ORAL_TABLET | Freq: Every day | ORAL | Status: DC
  Filled 2024-02-04 (×2): qty 1

## 2024-02-04 MED ORDER — TRAZODONE HCL 50 MG PO TABS
50.0000 mg | ORAL_TABLET | Freq: Every day | ORAL | Status: DC
  Administered 2024-02-04: 50 mg via ORAL
  Filled 2024-02-04 (×2): qty 1

## 2024-02-04 MED ORDER — ACETAMINOPHEN 325 MG PO TABS
650.0000 mg | ORAL_TABLET | Freq: Four times a day (QID) | ORAL | Status: DC | PRN
  Administered 2024-02-07 – 2024-02-22 (×12): 650 mg via ORAL
  Filled 2024-02-04 (×12): qty 2

## 2024-02-04 MED ORDER — HYDRALAZINE HCL 20 MG/ML IJ SOLN
10.0000 mg | Freq: Once | INTRAMUSCULAR | Status: AC
  Administered 2024-02-04: 10 mg via INTRAVENOUS
  Filled 2024-02-04: qty 1

## 2024-02-04 MED ORDER — NITROGLYCERIN 0.4 MG SL SUBL: 0.4000 mg | SUBLINGUAL_TABLET | SUBLINGUAL | Status: DC | PRN

## 2024-02-04 MED ORDER — HYDRALAZINE HCL 20 MG/ML IJ SOLN
10.0000 mg | Freq: Four times a day (QID) | INTRAMUSCULAR | Status: DC | PRN
  Administered 2024-02-04 – 2024-02-06 (×6): 10 mg via INTRAVENOUS
  Filled 2024-02-04 (×6): qty 1

## 2024-02-04 MED ORDER — SODIUM CHLORIDE 0.9% FLUSH: 3.0000 mL | INTRAVENOUS | Status: DC | PRN

## 2024-02-04 MED ORDER — IOHEXOL 350 MG/ML SOLN
100.0000 mL | Freq: Once | INTRAVENOUS | Status: AC | PRN
  Administered 2024-02-04: 100 mL via INTRAVENOUS

## 2024-02-04 MED ORDER — FOLIC ACID 1 MG PO TABS
1.0000 mg | ORAL_TABLET | Freq: Every day | ORAL | Status: DC
  Administered 2024-02-04: 1 mg via ORAL
  Filled 2024-02-04: qty 1

## 2024-02-04 MED ORDER — THIAMINE HCL 100 MG/ML IJ SOLN: 100.0000 mg | Freq: Every day | INTRAMUSCULAR | Status: DC

## 2024-02-04 MED ORDER — SODIUM CHLORIDE 0.9% FLUSH
3.0000 mL | Freq: Two times a day (BID) | INTRAVENOUS | Status: DC
  Administered 2024-02-04 (×2): 3 mL via INTRAVENOUS

## 2024-02-04 MED ORDER — PANTOPRAZOLE SODIUM 40 MG PO TBEC
40.0000 mg | DELAYED_RELEASE_TABLET | Freq: Every day | ORAL | Status: DC
  Administered 2024-02-04: 40 mg via ORAL
  Filled 2024-02-04 (×3): qty 1

## 2024-02-04 MED ORDER — ASPIRIN 81 MG PO TBEC
81.0000 mg | DELAYED_RELEASE_TABLET | Freq: Every day | ORAL | Status: DC
  Administered 2024-02-04: 81 mg via ORAL
  Filled 2024-02-04 (×2): qty 1

## 2024-02-04 MED ORDER — LOPERAMIDE HCL 2 MG PO CAPS: 2.0000 mg | ORAL_CAPSULE | ORAL | Status: DC | PRN

## 2024-02-04 MED ORDER — NICOTINE 21 MG/24HR TD PT24
21.0000 mg | MEDICATED_PATCH | Freq: Every day | TRANSDERMAL | Status: DC
  Administered 2024-02-04 – 2024-02-22 (×19): 21 mg via TRANSDERMAL
  Filled 2024-02-04 (×19): qty 1

## 2024-02-04 MED ORDER — ONDANSETRON HCL 4 MG/2ML IJ SOLN
4.0000 mg | Freq: Once | INTRAMUSCULAR | Status: AC
  Administered 2024-02-04: 4 mg via INTRAVENOUS
  Filled 2024-02-04: qty 2

## 2024-02-04 MED ORDER — LORAZEPAM 1 MG PO TABS: 1.0000 mg | ORAL_TABLET | ORAL | Status: DC | PRN

## 2024-02-04 MED ORDER — HALOPERIDOL LACTATE 5 MG/ML IJ SOLN
5.0000 mg | Freq: Once | INTRAMUSCULAR | Status: AC
  Administered 2024-02-04: 5 mg via INTRAVENOUS
  Filled 2024-02-04: qty 1

## 2024-02-04 MED ORDER — FOLIC ACID 1 MG PO TABS
1.0000 mg | ORAL_TABLET | Freq: Every day | ORAL | Status: DC
  Administered 2024-02-07 – 2024-02-12 (×6): 1 mg via ORAL
  Filled 2024-02-04 (×8): qty 1

## 2024-02-04 MED ORDER — THIAMINE MONONITRATE 100 MG PO TABS: 100.0000 mg | ORAL_TABLET | Freq: Every day | ORAL | Status: DC

## 2024-02-04 MED ORDER — POLYETHYLENE GLYCOL 3350 17 G PO PACK: 17.0000 g | PACK | Freq: Every day | ORAL | Status: DC | PRN

## 2024-02-04 MED ORDER — ADULT MULTIVITAMIN W/MINERALS CH: 1.0000 | ORAL_TABLET | Freq: Every day | ORAL | Status: DC

## 2024-02-04 MED ORDER — ADULT MULTIVITAMIN W/MINERALS CH
1.0000 | ORAL_TABLET | Freq: Every day | ORAL | Status: DC
  Administered 2024-02-07 – 2024-02-22 (×14): 1 via ORAL
  Filled 2024-02-04 (×16): qty 1

## 2024-02-04 MED ORDER — ONDANSETRON HCL 4 MG/2ML IJ SOLN: 4.0000 mg | Freq: Four times a day (QID) | INTRAMUSCULAR | Status: DC | PRN

## 2024-02-04 NOTE — ED Notes (Signed)
 Pt given pillow and bed adjusted to fit pts comfort.

## 2024-02-04 NOTE — ED Notes (Signed)
ivc 

## 2024-02-04 NOTE — ED Notes (Signed)
 Pt brought over from cpod. Pt yelling and screaming to staff. Pt being IVC due for medical reasons. Pt daughter is aware of situation.   Pt in gown on stretcher fall alarm in place, pt given warm blanket. Pt continues to yell and scream and cuss.

## 2024-02-04 NOTE — ED Notes (Signed)
 EKG obtained

## 2024-02-04 NOTE — ED Notes (Signed)
 Called CCMD to place pt on central monitoring

## 2024-02-04 NOTE — ED Notes (Signed)
 Pt continues to try and pee and states he needs to pee. Pt not urinating each time we attempt with urinal. Urinal test added on to pt sample in lab

## 2024-02-04 NOTE — ED Notes (Addendum)
 Around 1122, this RN responded to patient's bed alarm. Patient was attempting to get out of bed. Patient was very aggressive, swinging at this RN and cursing. Patient was not directable at the time of the event. This RN called for security and other staff to help calm patient down. Patient was assisted into a wheelchair. MD Pokhrel was notified immediately. MD Pokhrel ordered a dose of Haldol , which was administered. Patient was taken to new room and placed in bed with bed alarm on. Patient's daughter was also updated on the event. IVC pending at this time.

## 2024-02-04 NOTE — ED Notes (Signed)
Cup of water was given to pt.

## 2024-02-04 NOTE — ED Notes (Signed)
 Pt brought to room 34, pt denied warm blanket, pt would not allow me to hook him up to cardiac monitoring. 02 monitor is connected.
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
## 2024-02-04 NOTE — ED Notes (Signed)
 2A called and informed secretary that pt is headed to the floor.

## 2024-02-04 NOTE — ED Notes (Signed)
Pt given a urinal per request

## 2024-02-04 NOTE — ED Notes (Addendum)
 Security called to assist in transport since pt is IVC with RN Sydni

## 2024-02-04 NOTE — ED Notes (Signed)
 Pt continues to get up out of bed. Pt needed urinal this RN assisted pt but pt was already peeing on the floor. Pt cleaned up and repositioned in the bed. Pt cleaned up and placed in clean dry clothes. Pt placed in clean yellow socks. Laying back down and resting at this time.

## 2024-02-04 NOTE — Evaluation (Signed)
 Occupational Therapy Evaluation Patient Details Name: Edward Weber MRN: [id number] DOB: 05/08/1960 Today's Date: 02/04/2024   History of Present Illness   63 years old male with past medical history of hypertension, stroke, history of sickle cell anemia, GERD polysubstance abuse presented to the hospital with complaints of dizziness and passing out spell while he was trying to walk to his bathroom.  He also had some shortness of breath and exertional chest pain and dyspnea on exertion for past few days.  Family was able to catch him and lowered him to the floor but patient complains of pain on the back.  There was report that recently his medications including gabapentin  and amlodipine were removed.  Patient states that he has been smoking cigarettes and he uses cocaine.  Denies any nausea vomiting or abdominal pain.  Denies any urinary urgency frequency or dysuria.  Denies any dizziness lightheadedness currently.     Clinical Impressions Patient presenting with decreased Ind in self care,balance, functional mobility/transfers, endurance, and safety awareness, and cognition. Upon arriving to the room, pt is standing in room, with nursing, and urinating on the floor and all over clothing. Pt is argumentative and agitated during this session while staff attempting to keep him safe. Pt needing mod A to doff soiled clothing and cleanse skin. Mod A to don hospital down. Mod - max A for sit >supine. Pt does confirm being Ind with self care and using SPC for mobility. When asked about home set up he states,  Don't worry about it. Information obtained from prior admission and will need to confirm as no family present at time of evaluation. Pt with no safety awareness or insight to deficits at this time. Very unsteady on his feet as he almost slips in his own urine on floor.  Patient will benefit from acute OT to increase overall independence in the areas of ADLs, functional mobility, and safety awareness in  order to safely discharge.      If plan is discharge home, recommend the following:   A lot of help with walking and/or transfers;A lot of help with bathing/dressing/bathroom;Assistance with cooking/housework;Help with stairs or ramp for entrance;Assist for transportation;Direct supervision/assist for financial management;Direct supervision/assist for medications management;Supervision due to cognitive status     Functional Status Assessment   Patient has had a recent decline in their functional status and demonstrates the ability to make significant improvements in function in a reasonable and predictable amount of time.     Equipment Recommendations   Other (comment) (defer to next venue of care)      Precautions/Restrictions   Precautions Precautions: Fall     Mobility Bed Mobility Overal bed mobility: Needs Assistance Bed Mobility: Supine to Sit, Sit to Supine       Sit to supine: Mod assist        Transfers Overall transfer level: Needs assistance Equipment used: 1 person hand held assist Transfers: Sit to/from Stand, Bed to chair/wheelchair/BSC Sit to Stand: Min assist     Step pivot transfers: Min assist            Balance Overall balance assessment: Needs assistance Sitting-balance support: Feet supported Sitting balance-Leahy Scale: Fair     Standing balance support: During functional activity, Bilateral upper extremity supported, Single extremity supported Standing balance-Leahy Scale: Poor                             ADL either performed or assessed with  clinical judgement   ADL Overall ADL's : Needs assistance/impaired                     Lower Body Dressing: Moderate assistance   Toilet Transfer: Minimal assistance                   Vision Baseline Vision/History: 1 Wears glasses Patient Visual Report: No change from baseline       Perception         Praxis         Pertinent Vitals/Pain  Pain Assessment Pain Assessment: Faces Faces Pain Scale: Hurts even more Pain Location: general -all over Pain Descriptors / Indicators: Discomfort Pain Intervention(s): Monitored during session, Repositioned     Extremity/Trunk Assessment Upper Extremity Assessment Upper Extremity Assessment: Generalized weakness   Lower Extremity Assessment Lower Extremity Assessment: Generalized weakness       Communication Communication Communication: No apparent difficulties   Cognition Arousal: Alert Behavior During Therapy: Agitated, Impulsive Cognition: No family/caregiver present to determine baseline, Cognition impaired   Orientation impairments: Place, Time, Situation Awareness: Intellectual awareness impaired, Online awareness impaired   Attention impairment (select first level of impairment): Focused attention Executive functioning impairment (select all impairments): Initiation, Organization, Sequencing, Reasoning, Problem solving                   Following commands: Impaired Following commands impaired: Follows one step commands inconsistently, Follows one step commands with increased time     Cueing  General Comments   Cueing Techniques: Verbal cues;Gestural cues;Visual cues              Home Living Family/patient expects to be discharged to:: Private residence Living Arrangements: Non-relatives/Friends Available Help at Discharge: Family;Available PRN/intermittently Type of Home: Apartment       Home Layout: One level     Bathroom Shower/Tub: Chief Strategy Officer: Standard Bathroom Accessibility: Yes   Home Equipment: Cane - single point          Prior Functioning/Environment Prior Level of Function : Independent/Modified Independent;Patient poor historian/Family not available;History of Falls (last six months)             Mobility Comments: Pt ambulates with SPC ADLs Comments: Pt reports Ind with self care tasks     OT Problem List: Decreased strength;Decreased cognition;Decreased safety awareness;Decreased activity tolerance;Decreased knowledge of use of DME or AE;Impaired balance (sitting and/or standing);Decreased knowledge of precautions   OT Treatment/Interventions: Self-care/ADL training;Therapeutic activities;Therapeutic exercise;Cognitive remediation/compensation;Energy conservation;Patient/family education;Balance training;DME and/or AE instruction      OT Goals(Current goals can be found in the care plan section)   Acute Rehab OT Goals Patient Stated Goal: to go home OT Goal Formulation: With patient Time For Goal Achievement: 02/18/24 Potential to Achieve Goals: Fair ADL Goals Pt Will Perform Grooming: with supervision;standing Pt Will Perform Lower Body Dressing: with supervision;sit to/from stand Pt Will Transfer to Toilet: with supervision;ambulating Pt Will Perform Toileting - Clothing Manipulation and hygiene: with supervision;sit to/from stand   OT Frequency:  Min 2X/week       AM-PAC OT 6 Clicks Daily Activity     Outcome Measure Help from another person eating meals?: None Help from another person taking care of personal grooming?: A Little Help from another person toileting, which includes using toliet, bedpan, or urinal?: A Little Help from another person bathing (including washing, rinsing, drying)?: A Little Help from another person to put on and taking off regular upper body clothing?:  A Little Help from another person to put on and taking off regular lower body clothing?: A Lot 6 Click Score: 18   End of Session Nurse Communication: Mobility status  Activity Tolerance: Patient tolerated treatment well Patient left: in bed;with call bell/phone within reach;with nursing/sitter in room  OT Visit Diagnosis: Unsteadiness on feet (R26.81);Repeated falls (R29.6);Muscle weakness (generalized) (M62.81)                Time: 8977-8957 OT Time Calculation (min): 20  min Charges:  OT General Charges $OT Visit: 1 Visit OT Evaluation $OT Eval Moderate Complexity: 1 Mod OT Treatments $Self Care/Home Management : 8-22 mins

## 2024-02-04 NOTE — ED Notes (Signed)
 Patient had an episode of urinary incontinence. Patient was cleaned up and placed in a clean gown. Patient was returned to bed and placed on the cardiac monitor. Fall risk precautions in place at this time.

## 2024-02-04 NOTE — ED Provider Notes (Signed)
 [facility] Provider Note    Event Date/Time   First MD Initiated Contact with Patient 02/04/24 0229     (approximate)   History   Chief Complaint: Near Syncope   HPI  Edward Weber is a 63 y.o. male with htn, alcohol abuse who comes to ED today after syncope at home. he feels SOB but not wheezy on exam. he has been having exertional CP and DOE for the past few days, no cough or fever. currently no CP.       Past Medical History:  Diagnosis Date   Alcoholism (HCC)    Allergic rhinitis    Allergic state    Anemia    Arthritis    Cancer (HCC)    Chronic hepatitis C (HCC)    Depression    Esophagitis, reflux    GERD (gastroesophageal reflux disease)    Gout    Helicobacter pylori gastritis    Hepatitis    Hypertension    Neuromuscular disorder (HCC)    Osteoarthrosis    Peripheral neuropathy    Rhabdomyolysis    Sickle cell anemia (HCC)    Stroke [facility])    Stroke [facility])    Vitiligo     Current Outpatient Rx   Order #: [id number] Class: Historical Med   Order #: [id number] Class: Historical Med   Order #: [id number] Class: Print   Order #: [id number] Class: Historical Med   Order #: [id number] Class: Normal   Order #: [id number] Class: Historical Med   Order #: [id number] Class: Normal   Order #: [id number] Class: Historical Med   Order #: [id number] Class: Historical Med   Order #: [id number] Class: Normal   Order #: [id number] Class: Normal   Order #: [id number] Class: Historical Med    Past Surgical History:  Procedure Laterality Date   COLON SURGERY     COLONOSCOPY WITH PROPOFOL  N/A 05/09/2015   Procedure: COLONOSCOPY WITH PROPOFOL ;  Surgeon: Deward CINDERELLA Piedmont, MD;  Location: [facility] ENDOSCOPY;  Service: Gastroenterology;  Laterality: N/A;   ESOPHAGOGASTRODUODENOSCOPY N/A 05/09/2015   Procedure: ESOPHAGOGASTRODUODENOSCOPY (EGD);  Surgeon: Deward CINDERELLA Piedmont, MD;  Location: [facility] ENDOSCOPY;  Service: Gastroenterology;  Laterality: N/A;   ESOPHAGOGASTRODUODENOSCOPY  (EGD) WITH PROPOFOL  N/A 05/04/2017   Procedure: ESOPHAGOGASTRODUODENOSCOPY (EGD) WITH PROPOFOL ;  Surgeon: Toledo, Ladell POUR, MD;  Location: [facility] ENDOSCOPY;  Service: Gastroenterology;  Laterality: N/A;   FINGER DEBRIDEMENT  1976   s/p infected dog bite   TEE WITHOUT CARDIOVERSION N/A 05/20/2012   Procedure: TRANSESOPHAGEAL ECHOCARDIOGRAM (TEE);  Surgeon: Vina LULLA Gull, MD;  Location: [facility] ENDOSCOPY;  Service: Cardiovascular;  Laterality: N/A;    Physical Exam   Triage Vital Signs: ED Triage Vitals  Encounter Vitals Group     BP 02/04/24 0237 (!) 203/102     Girls Systolic BP Percentile --      Girls Diastolic BP Percentile --      Boys Systolic BP Percentile --      Boys Diastolic BP Percentile --      Pulse Rate 02/04/24 0237 84     Resp 02/04/24 0237 20     Temp 02/04/24 0237 98.1 F (36.7 C)     Temp Source 02/04/24 0237 Oral     SpO2 02/04/24 0237 98 %     Weight 02/04/24 0239 202 lb 9.6 oz (91.9 kg)     Height 02/04/24 0239 5' 8 (1.727 m)     Head Circumference --      Peak Flow --      Pain Score 02/04/24 0238 7  Pain Loc --      Pain Education --      Exclude from Growth Chart --     Most recent vital signs: Vitals:   02/04/24 0600 02/04/24 0615  BP: (!) 156/72 (!) 159/101  Pulse:    Resp: 19 18  Temp:    SpO2:      General: Awake, no distress.  CV:  Good peripheral perfusion.  Regular rate rhythm Resp:  Normal effort.  Clear lungs Abd:  No distention.  Truncal obesity.  Soft nontender Other:  No lower extremity edema   ED Results / Procedures / Treatments   Labs (all labs ordered are listed, but only abnormal results are displayed) Labs Reviewed  CBC WITH DIFFERENTIAL/PLATELET - Abnormal; Notable for the following components:      Result Value   RBC 3.70 (*)    Hemoglobin 11.5 (*)    HCT 34.9 (*)    All other components within normal limits  COMPREHENSIVE METABOLIC PANEL WITH GFR - Abnormal; Notable for the following components:   Glucose, Bld 166  (*)    Calcium  8.5 (*)    All other components within normal limits  RESP PANEL BY RT-PCR (RSV, FLU A&B, COVID)  RVPGX2  BRAIN NATRIURETIC PEPTIDE  TROPONIN I (HIGH SENSITIVITY)  TROPONIN I (HIGH SENSITIVITY)     EKG Interpreted by me Sinus rhythm rate of 77.  Normal axis, normal intervals.  Poor R wave progression.  Inferolateral T wave inversions, unchanged compared to prior EKG on January 05, 2024.   RADIOLOGY Chest x-ray interpreted by me, unremarkable.  Radiology report reviewed   PROCEDURES:  .Critical Care  Performed by: Viviann Pastor, MD Authorized by: Viviann Pastor, MD   Critical care provider statement:    Critical care time (minutes):  35   Critical care time was exclusive of:  Separately billable procedures and treating other patients   Critical care was necessary to treat or prevent imminent or life-threatening deterioration of the following conditions:  CNS failure or compromise, circulatory failure and cardiac failure   Critical care was time spent personally by me on the following activities:  Development of treatment plan with patient or surrogate, discussions with consultants, evaluation of patient's response to treatment, examination of patient, obtaining history from patient or surrogate, ordering and performing treatments and interventions, ordering and review of laboratory studies, ordering and review of radiographic studies, pulse oximetry, re-evaluation of patient's condition and review of old charts   Care discussed with: admitting provider      MEDICATIONS ORDERED IN ED: Medications  nitroGLYCERIN  (NITROSTAT ) SL tablet 0.4 mg (has no administration in time range)  hydrALAZINE  (APRESOLINE ) injection 10 mg (10 mg Intravenous Given 02/04/24 0251)  ondansetron  (ZOFRAN ) injection 4 mg (4 mg Intravenous Given 02/04/24 0402)  iohexol  (OMNIPAQUE ) 350 MG/ML injection 100 mL (100 mLs Intravenous Contrast Given 02/04/24 0453)  LORazepam  (ATIVAN )  injection 2 mg (2 mg Intravenous Given 02/04/24 0432)     IMPRESSION / MDM / ASSESSMENT AND PLAN / ED COURSE  I reviewed the triage vital signs and the nursing notes.  DDx: Non-STEMI, pulmonary embolism, anemia, electrolyte derangement, viral illness/COVID/influenza, heart failure, intracranial hemorrhage.  Doubt dissection  Patient's presentation is most consistent with acute presentation with potential threat to life or bodily function.  Patient presents with exertional chest pain and dyspnea on exertion for the past few days with syncope this evening.  Currently severely hypertensive with otherwise unremarkable vital signs.  Will give IV hydralazine  for initial blood  pressure management while obtaining labs, CT chest.  ----------------------------------------- 4:12 AM on 02/04/2024 ----------------------------------------- Initial labs are all reassuring.  Awaiting CT.  Patient also reports some dizziness, will add on CT head given severe hypertension.   ----------------------------------------- 6:46 AM on 02/04/2024 ----------------------------------------- No acute findings on CT.  Discussed with hospitalist      FINAL CLINICAL IMPRESSION(S) / ED DIAGNOSES   Final diagnoses:  Chest pain with moderate risk for cardiac etiology  Syncope, unspecified syncope type     Rx / DC Orders   ED Discharge Orders     None        Note:  This document was prepared using Dragon voice recognition software and may include unintentional dictation errors.   Viviann Pastor, MD 02/04/24 [phone]

## 2024-02-04 NOTE — Discharge Instructions (Signed)
 Residential Substance Use Treatment Services   [facility]  [address]  [place], [place] [address]  [phone] or [phone] Detox (Medicare, Medicaid, private insurance, and self pay)  Residential Rehab 14 days (Medicare, [place], private insurance, and self pay)   [facility])  [address] [place], [place]  [phone]  Male and Male Detox (Self Pay and Medicaid limited availability)  Rehab only Male ([place] and self pay only)   [facility] [address]      [address]  [place], [place] [address]  [phone] or [phone] Detox and Residential Treatment Private Insurance Only   [facility]  [address] [place]  [place], [place] [address]  [phone]  Treatment Only, must make assessment appointment, and must be sober for assessment appointment.  Self Pay Only, Medicare A&B, [facility], [place] ID only! *Transportation assistance offered from [place] [employer]     [address] [place], [place] [address] Walk in interviews M-Sat 8-4p No pending legal charges [phone]     ADATC:  [facility] Referral  [address] [place], [place] [phone] (Self Pay, [facility])  [facility] [address] [place], [place] [address] [phone] Detox and Residential Treatment Medicare and Private Insurance  [facility] [address]  [place], [place] [address] 28 Day Women's Facility: [phone] 28 Day Men's Facility: [phone] Long-term Residential Program:  [phone] Males 25 and Over (No Insurance, upfront fee)  [facility]  [address] [place], [place] [address] [phone] Private Insurance with [employer], Private Pay  [facility] [address] [place], [place] [address] Local [phone] Private Insurance Only  [facility] [address]  [place], [place] [address]  [phone] (Males, upfront fee)  [facility] [address]  [place], [address] [phone] Private Insurance

## 2024-02-04 NOTE — H&P (Addendum)
 [facility] History and Physical  Edward Weber FMW:[id number] DOB: [birth year] DOA: 02/04/2024  Referring physician: ED  PCP: Edward Norleen BIRCH, Weber   Patient is coming from: Home  Chief Complaint: Syncope  HPI:  Patient is a 63 years old male with past medical history of hypertension, stroke, history of sickle cell anemia, GERD polysubstance abuse presented to the hospital with complaints of dizziness and passing out spell while he was trying to walk to his bathroom.  He also had some shortness of breath and exertional chest pain and dyspnea on exertion for past few days.  Family was able to catch him and lowered him to the floor but patient complains of pain on the back.  There was report that recently his medications including gabapentin  and amlodipine were removed.  Patient states that he has been smoking cigarettes and he uses cocaine.  Denies any nausea vomiting or abdominal pain.  Denies any urinary urgency frequency or dysuria.  Denies any dizziness lightheadedness currently.  Denies any sick contacts or recent travel.  In the ED, patient had elevated blood pressure at 203/102.  Afebrile with a temperature of 98.1 F.  Labs were notable for normal WBC with hemoglobin of 11.5.  BMP with creatinine of 1.0.  BNP within normal range at 81.  COVID influenza and RSV was negative.  Chest x-ray showed no acute infiltrate.  CT angiogram of the chest showed no evidence of pulmonary embolism.  CT head scan was negative for acute findings.  Patient was then considered for admission to hospital for syncope.   Assessment and Plan Principal Problem:   Chest pain Active Problems:   Recurrent syncope   Alcohol abuse   Essential hypertension   GERD without esophagitis   Chest pain.  Had exertional shortness of breath and chest pain,  2 sets of troponin negative.  EKG nonischemic.  Will continue to monitor closely.  CT angiogram of the chest are negative for PE.  Continue nitroglycerin  as  needed.  Will check 2D echocardiogram.  Add aspirin .  Syncope Will check 2D echocardiogram, troponins, telemetry monitoring.  Patient is on lisinopril HCTZ, amlodipine, hydrocodone , Neurontin  at home.  Will get orthostatic blood pressure.  Will check TSH A1c.  Will check urine drug screen as well.  Will get PT OT evaluation.  Patient does have a history of recurrent falls in the past.  Accelerated hypertension Will need good control of blood pressure.  Patient is on amlodipine, gabapentin , lisinopril HCTZ at home.  Will check orthostatic blood pressure first.  Polysubstance abuse History of smoking alcohol continue statin and cocaine abuse.  Check UDS.  History of sickle cell anemia Latest hemoglobin of 11.5.  Will continue to monitor.  Hemoglobin levels are at his baseline.  History of GERD. Continue Protonix .  Class I obesity.Body mass index is 30.81 kg/m.  Patient would benefit from lifestyle modification and weight loss as outpatient  Addendum:  02/04/2024 2:01 PM   Nursing staff called me for patient being agitated and aggressive trying to swing at staff and trying to leave.  Despite multiple conversations with patient's patient does not understand the gravity of his illness and wishes to go home but was a two-person assist and is not safe.  He is at risk of worsening health condition due to presentation with chest pain and syncope.  Has history of polysubstance abuse as well and nursing staff reported that he had rising CIWA scores.  Will place the patient on CIWA protocol.  At this  time security was at bedside and patient has been involuntarily committed.  Will consult psychiatry for further evaluation.  DVT Prophylaxis: SCD  Review of Systems:  All systems were reviewed and were negative unless otherwise mentioned in the HPI   Past Medical History:  Diagnosis Date   Alcoholism (HCC)    Allergic rhinitis    Allergic state    Anemia    Arthritis    Cancer (HCC)    Chronic  hepatitis C (HCC)    Depression    Esophagitis, reflux    GERD (gastroesophageal reflux disease)    Gout    Helicobacter pylori gastritis    Hepatitis    Hypertension    Neuromuscular disorder (HCC)    Osteoarthrosis    Peripheral neuropathy    Rhabdomyolysis    Sickle cell anemia (HCC)    Stroke (HCC)    Stroke [facility])    Vitiligo    Past Surgical History:  Procedure Laterality Date   COLON SURGERY     COLONOSCOPY WITH PROPOFOL  N/A 05/09/2015   Procedure: COLONOSCOPY WITH PROPOFOL ;  Surgeon: Edward Weber;  Location: [facility] ENDOSCOPY;  Service: Gastroenterology;  Laterality: N/A;   ESOPHAGOGASTRODUODENOSCOPY N/A 05/09/2015   Procedure: ESOPHAGOGASTRODUODENOSCOPY (EGD);  Surgeon: Edward Weber;  Location: [facility] ENDOSCOPY;  Service: Gastroenterology;  Laterality: N/A;   ESOPHAGOGASTRODUODENOSCOPY (EGD) WITH PROPOFOL  N/A 05/04/2017   Procedure: ESOPHAGOGASTRODUODENOSCOPY (EGD) WITH PROPOFOL ;  Surgeon: Edward Weber;  Location: [facility] ENDOSCOPY;  Service: Gastroenterology;  Laterality: N/A;   FINGER DEBRIDEMENT  1976   s/p infected dog bite   TEE WITHOUT CARDIOVERSION N/A 05/20/2012   Procedure: TRANSESOPHAGEAL ECHOCARDIOGRAM (TEE);  Surgeon: Edward Weber;  Location: [facility] ENDOSCOPY;  Service: Cardiovascular;  Laterality: N/A;    Social History:  reports that he has been smoking cigarettes. He has never used smokeless tobacco. He reports current alcohol use of about 9.0 standard drinks of alcohol per week. He reports current drug use. Drugs: Marijuana and Cocaine.  Allergies  Allergen Reactions   Bee Venom Anaphylaxis   Bovine (Beef) Protein Other (See Comments)    Personal preference   Bovine (Beef) Protein-Containing Drug Products     Personal preference   Lactalbumin    Milk-Related Compounds Nausea And Vomiting    Intoloerance   Pegademase Bovine     Other reaction(s): Unknown Personal preference Personal preference Personal preference    Tilactase Nausea And Vomiting     Intoloerance   Milk (Cow) Nausea And Vomiting    Intoloerance    History reviewed. No pertinent family history.   Prior to Admission medications   Medication Sig Start Date End Date Taking? Authorizing Provider  albuterol  (PROVENTIL  HFA;VENTOLIN  HFA) 108 (90 Base) MCG/ACT inhaler Inhale 2 puffs into the lungs daily as needed for wheezing or shortness of breath.   Yes Provider, Historical, Weber  AMBULATORY NON FORMULARY MEDICATION Trimix (30/1/10)-(Pap/Phent/PGE)  Test Dose  1ml vial   Qty #3 Refills 0  [facility] [phone] Fax [phone] 01/14/23  Yes McGowan, Clotilda A, PA-C  HYDROcodone -acetaminophen  (NORCO/VICODIN) 5-325 MG tablet Take 1 tablet by mouth every 6 (six) hours as needed. 01/18/24  Yes Provider, Historical, Weber  lisinopril-hydrochlorothiazide  (ZESTORETIC) 20-12.5 MG tablet Take 2 tablets by mouth daily. 01/14/24  Yes Provider, Historical, Weber  traZODone (DESYREL) 50 MG tablet Take 50 mg by mouth at bedtime. 12/28/23 12/27/24 Yes Provider, Historical, Weber  triamcinolone ointment (KENALOG) 0.1 % Apply topically.   Yes Provider, Historical, Weber  acetaminophen  (TYLENOL )  500 MG tablet Take by mouth. Patient not taking: Reported on 02/04/2024    Provider, Historical, Weber  amLODipine (NORVASC) 5 MG tablet Take 5 mg by mouth daily.    Provider, Historical, Weber  aspirin  EC 81 MG tablet Take 1 tablet (81 mg total) by mouth daily. Swallow whole. Patient not taking: Reported on 02/04/2024 09/21/22   Dorinda Drue DASEN, Weber  escitalopram  (LEXAPRO ) 20 MG tablet Take 20 mg by mouth daily. Patient not taking: Reported on 02/04/2024    Provider, Historical, Weber  folic acid  (FOLVITE ) 1 MG tablet Take 1 tablet (1 mg total) by mouth daily. Patient not taking: Reported on 02/04/2024 09/21/22   Dorinda Drue DASEN, Weber  gabapentin  (NEURONTIN ) 100 MG capsule Take 100 mg by mouth 3 (three) times daily.    Provider, Historical, Weber  oxyCODONE -acetaminophen  (PERCOCET/ROXICET) 5-325 MG tablet Take 1  tablet by mouth every 6 (six) hours as needed. Patient not taking: Reported on 02/04/2024 09/17/23   Provider, Historical, Weber  pantoprazole  (PROTONIX ) 40 MG tablet Take 40 mg by mouth daily.    Provider, Historical, Weber  predniSONE  (DELTASONE ) 50 MG tablet Take 1 tablet (50 mg total) by mouth daily with breakfast. Patient taking differently: Take 10 mg by mouth daily with breakfast. 01/05/24   Arlander Charleston, Weber  sildenafil  (VIAGRA ) 100 MG tablet Take 1 tablet (100 mg total) by mouth daily as needed for erectile dysfunction. Patient not taking: Reported on 02/04/2024 06/24/22   Penne Knee, Weber    Physical Exam:  Vitals:   02/04/24 0600 02/04/24 0615 02/04/24 0726 02/04/24 0755  BP: (!) 156/72 (!) 159/101 (!) 194/116   Pulse:   97   Resp: 19 18    Temp:    (!) 97.2 F (36.2 C)  TempSrc:      SpO2:      Weight:      Height:       Wt Readings from Last 3 Encounters:  02/04/24 91.9 kg  01/05/24 86.2 kg  12/27/23 86.2 kg   Body mass index is 30.81 kg/m.  General: Obese built, not in obvious distress HENT: Normocephalic, No scleral pallor or icterus noted. Oral mucosa is moist.  Chest:  Clear breath sounds.  . No crackles or wheezes.  CVS: S1 &S2 heard. No murmur.  Regular rate and rhythm. Abdomen: Soft, nontender, nondistended.  Bowel sounds are heard. No abdominal mass palpated Extremities: No cyanosis, clubbing or edema.  Peripheral pulses are palpable. Psych: Alert, awake and oriented, normal mood CNS:  No cranial nerve deficits.  Power equal in all extremities.   Skin: Warm and dry.  No rashes noted.  Labs on Admission:   CBC: Recent Labs  Lab 02/04/24 0242  WBC 7.3  NEUTROABS 5.5  HGB 11.5*  HCT 34.9*  MCV 94.3  PLT 166    Basic Metabolic Panel: Recent Labs  Lab 02/04/24 0242  NA 141  K 4.0  CL 106  CO2 24  GLUCOSE 166*  BUN 12  CREATININE 1.01  CALCIUM  8.5*    Liver Function Tests: Recent Labs  Lab 02/04/24 0242  AST 28  ALT 22  ALKPHOS 44   BILITOT 0.7  PROT 6.9  ALBUMIN 4.0   No results for input(s): LIPASE, AMYLASE in the last 168 hours. No results for input(s): AMMONIA in the last 168 hours.  Cardiac Enzymes: No results for input(s): CKTOTAL, CKMB, CKMBINDEX, TROPONINI in the last 168 hours.  BNP (last 3 results) Recent Labs    02/04/24 0242  BNP 81.8    ProBNP (last 3 results) No results for input(s): PROBNP in the last 8760 hours.  CBG: No results for input(s): GLUCAP in the last 168 hours.  Lipase     Component Value Date/Time   LIPASE 39 09/17/2022 1818   LIPASE 182 04/08/2012 1315     Urinalysis    Component Value Date/Time   COLORURINE YELLOW (A) 09/17/2022 2214   APPEARANCEUR CLOUDY (A) 09/17/2022 2214   APPEARANCEUR Clear 01/17/2020 1550   LABSPEC 1.017 09/17/2022 2214   LABSPEC 1.010 04/01/2014 1721   PHURINE 5.0 09/17/2022 2214   GLUCOSEU NEGATIVE 09/17/2022 2214   GLUCOSEU Negative 04/01/2014 1721   HGBUR NEGATIVE 09/17/2022 2214   BILIRUBINUR NEGATIVE 09/17/2022 2214   BILIRUBINUR Negative 01/17/2020 1550   BILIRUBINUR Negative 04/01/2014 1721   KETONESUR NEGATIVE 09/17/2022 2214   PROTEINUR 30 (A) 09/17/2022 2214   NITRITE NEGATIVE 09/17/2022 2214   LEUKOCYTESUR NEGATIVE 09/17/2022 2214   LEUKOCYTESUR Negative 04/01/2014 1721     Drugs of Abuse     Component Value Date/Time   LABOPIA NEGATIVE 04/01/2014 1721   COCAINSCRNUR POSITIVE 04/01/2014 1721   LABBENZ NEGATIVE 04/01/2014 1721   AMPHETMU NEGATIVE 04/01/2014 1721   THCU POSITIVE 04/01/2014 1721   LABBARB NEGATIVE 04/01/2014 1721      Radiological Exams on Admission: CT Angio Chest PE W and/or Wo Contrast Result Date: 02/04/2024 EXAM: CTA of the Chest with contrast for PE 02/04/2024 05:05:55 AM TECHNIQUE: CTA of the chest was performed without and with the administration of 100 mL of iohexol  (OMNIPAQUE ) 350 MG/ML injection. Multiplanar reformatted images are provided for review. MIP images are  provided for review. Automated exposure control, iterative reconstruction, and/or weight based adjustment of the mA/kV was utilized to reduce the radiation dose to as low as reasonably achievable. COMPARISON: 03/10/2028 CLINICAL HISTORY: Pulmonary embolism (PE) suspected, high prob. Per ed notes; Brought in by EMS due to getting dizzy and passing out while walking to his bedroom. Family caught the patient and lowered him to the floor. Patient did not hit his head or have any injuries from the fall. No blood thinners or complaints of pain. FINDINGS: PULMONARY ARTERIES: Pulmonary arteries are adequately opacified for evaluation. No pulmonary embolism. Main pulmonary artery is normal in caliber. MEDIASTINUM: The heart demonstrates mild cardiac enlargement. No pericardial effusion. Coronary artery calcifications. Aortic atherosclerosis. Aorta measures 4 cm (image 73/8). No acute findings within the imaged portions of the upper abdomen. Moderate-sized hiatal hernia. LYMPH NODES: No mediastinal, hilar or axillary lymphadenopathy. LUNGS AND PLEURA: The lungs are without acute process. No focal consolidation or pulmonary edema. No pleural effusion or pneumothorax. UPPER ABDOMEN: Morphologic features of the liver compatible with cirrhosis. Aortic atherosclerotic calcifications. Nonobstructing stone within the lower pole of the left kidney measures 7 mm. SOFT TISSUES AND BONES: Fixation hardware identified within the lower cervical spine. No acute soft tissue abnormality. IMPRESSION: 1. No evidence of pulmonary embolism. 2. Aorta measures 4 cm with atherosclerotic calcifications.Recommend annual imaging followup by CTA or MRA. This recommendation follows 2010 ACCF/AHA/AATS/ACR/ASA/SCA/SCAI/SIR/STS/SVM Guidelines for the Diagnosis and Management of Patients with Thoracic Aortic Disease. Circulation. 2010; 121: Z733-z630. Aortic aneurysm NOS (ICD10-I71.9) 3. Aortic atherosclerosis and coronary artery calcifications. 4.  Morphologic features of the liver compatible with cirrhosis. 5. Hiatal hernia. Electronically signed by: Waddell Calk Weber 02/04/2024 06:20 AM EDT RP Workstation: [id number]   CT Head Wo Contrast Result Date: 02/04/2024 EXAM: CT HEAD WITHOUT CONTRAST 02/04/2024 05:05:55 AM TECHNIQUE: CT of the head was performed without  the administration of intravenous contrast. Automated exposure control, iterative reconstruction, and/or weight based adjustment of the mA/kV was utilized to reduce the radiation dose to as low as reasonably achievable. COMPARISON: Brain MRI 05/11/2022. Head CT 09/18/2022. CLINICAL HISTORY: Mental status change, unknown cause. Brought in by EMS due to getting dizzy and passing out. No head injury or pain. FINDINGS: BRAIN AND VENTRICLES: No acute hemorrhage. No evidence of acute infarct. No hydrocephalus. No extra-axial collection. No mass effect or midline shift. Scattered chronic lacunar infarcts in the bilateral deep gray matter nuclei appear stable from last year. Additional patchy white matter hypodensity in both hemispheres appears stable. Chronic heterogeneity in the pons is stable. Advanced calcified atherosclerosis at the skull base. No suspicious intracranial vascular hyperdensity. Stable noncontrast CT appearance of advanced chronic small vessel disease. ORBITS: No acute abnormality. SINUSES: Paranasal sinuses, middle ears, and mastoids remain well aerated. SOFT TISSUES AND SKULL: No acute soft tissue abnormality. Chronic left lamina papyracea fracture. . Chronic right superior frontal craniotomy. Small benign left anterior frontal bone exostosis is stable and appears inconsequential. Congenital incomplete ossification of the posterior C1 ring. Partially visible chronic C1-C2 degeneration. IMPRESSION: 1. No acute intracranial abnormality. 2. Stable noncontrast CT appearance of advanced chronic small vessel disease. Electronically signed by: Helayne Hurst Weber 02/04/2024 05:47 AM EDT RP  Workstation: [id number]   DG Chest Portable 1 View Result Date: 02/04/2024 CLINICAL DATA:  Dizziness and recent syncopal episode EXAM: PORTABLE CHEST 1 VIEW COMPARISON:  01/05/2024 FINDINGS: The heart size and mediastinal contours are within normal limits. Both lungs are clear. The visualized skeletal structures are unremarkable. Postsurgical changes are noted in the cervical spine. IMPRESSION: No active disease. Electronically Signed   By: Oneil Devonshire M.D.   On: 02/04/2024 03:16    EKG: Personally reviewed by me which shows normal sinus rhythm   Consultant: None  Code Status: Full code  Microbiology none  Antibiotics: None  Family Communication:  Patients' condition and plan of care including tests being ordered have been discussed with the patient and patient's daughter on the phone who indicate understanding and agree with the plan.   Status is: Observation The patient remains OBS appropriate and will d/c before 2 midnights.   Severity of Illness: The appropriate patient status for this patient is OBSERVATION. Observation status is judged to be reasonable and necessary in order to provide the required intensity of service to ensure the patient's safety. The patient's presenting symptoms, physical exam findings, and initial radiographic and laboratory data in the context of their medical condition is felt to place them at decreased risk for further clinical deterioration. Furthermore, it is anticipated that the patient will be medically stable for discharge from the hospital within 2 midnights of admission.   Signed, Vernal Alstrom, Weber [facility] 02/04/2024

## 2024-02-04 NOTE — ED Notes (Addendum)
 Lying BP: 158/90 (107), HR: 97 Sitting BP: 192/100 Standing: 165/120  Patient stated  he wasn't feeling good. Patient could barely stand while getting orthostatics vitals signs.  Patient was very sweaty. Staff was unable to get clear vital signs.

## 2024-02-04 NOTE — ED Triage Notes (Signed)
 Brought in by EMS due to getting dizzy and passing out while walking to his bedroom.  Family caught the patient and lowered him to the floor.  Patient did not hit his head or have any injuries from the fall.  No blood thinners or complaints of pain.  Meds that were changed last week:  Removed Gabapentin  and Amlodipine  Ambulatory on scene Was given a duo neb bc of wheezing. Pt had not taken his daily inhaler.  Per EMS  210/100 then 169/79 97% RA T 97.7 195 CBG

## 2024-02-04 NOTE — Progress Notes (Cosign Needed)
 Patient was attempted to be assessed by psychiatry.  It appears patient was IVC for his safety and due to altered mental status.  Patient currently only alert to self when psychiatry rounded on him.  Patient unable to participate in assessment at this time.  Patient is being medically admitted.

## 2024-02-04 NOTE — ED Notes (Signed)
 MD messaged regarding pt CIWA score and need for meds

## 2024-02-04 NOTE — ED Notes (Signed)
 Pt given urinal.

## 2024-02-04 NOTE — ED Notes (Signed)
 Pt appears agitated and unable to sit still at this time. Pt unable to tell RN what month or year it is. Viviann, MD made aware.

## 2024-02-04 NOTE — ED Notes (Signed)
 Security at bedside

## 2024-02-04 NOTE — ED Notes (Signed)
 Patient transported to CT

## 2024-02-05 ENCOUNTER — Encounter: Payer: Self-pay | Admitting: Internal Medicine

## 2024-02-05 ENCOUNTER — Inpatient Hospital Stay: Admit: 2024-02-05 | Discharge: 2024-02-05 | Disposition: A | Attending: Internal Medicine

## 2024-02-05 DIAGNOSIS — E119 Type 2 diabetes mellitus without complications: Secondary | ICD-10-CM

## 2024-02-05 DIAGNOSIS — F10931 Alcohol use, unspecified with withdrawal delirium: Secondary | ICD-10-CM

## 2024-02-05 DIAGNOSIS — R55 Syncope and collapse: Secondary | ICD-10-CM

## 2024-02-05 DIAGNOSIS — D571 Sickle-cell disease without crisis: Secondary | ICD-10-CM

## 2024-02-05 DIAGNOSIS — R079 Chest pain, unspecified: Secondary | ICD-10-CM | POA: Diagnosis not present

## 2024-02-05 LAB — ECHOCARDIOGRAM COMPLETE
AR max vel: 4.58 cm2
AV Area VTI: 5.91 cm2
AV Area mean vel: 4.59 cm2
AV Mean grad: 3 mmHg
AV Peak grad: 5.7 mmHg
Ao pk vel: 1.19 m/s
Area-P 1/2: 6.54 cm2
Height: 68 in
MV VTI: 6.89 cm2
P 1/2 time: 534 ms
S' Lateral: 3.2 cm
Weight: 2892.44 [oz_av]

## 2024-02-05 LAB — CBC
HCT: 34.1 % — ABNORMAL LOW (ref 39.0–52.0)
Hemoglobin: 11.6 g/dL — ABNORMAL LOW (ref 13.0–17.0)
MCH: 31.6 pg (ref 26.0–34.0)
MCHC: 34 g/dL (ref 30.0–36.0)
MCV: 92.9 fL (ref 80.0–100.0)
Platelets: 169 K/uL (ref 150–400)
RBC: 3.67 MIL/uL — ABNORMAL LOW (ref 4.22–5.81)
RDW: 14.4 % (ref 11.5–15.5)
WBC: 8.8 K/uL (ref 4.0–10.5)
nRBC: 0 % (ref 0.0–0.2)

## 2024-02-05 LAB — BASIC METABOLIC PANEL WITH GFR
Anion gap: 12 (ref 5–15)
BUN: 7 mg/dL — ABNORMAL LOW (ref 8–23)
CO2: 23 mmol/L (ref 22–32)
Calcium: 8.9 mg/dL (ref 8.9–10.3)
Chloride: 104 mmol/L (ref 98–111)
Creatinine, Ser: 1.02 mg/dL (ref 0.61–1.24)
GFR, Estimated: >60 mL/min (ref >60)
Glucose, Bld: 130 mg/dL — ABNORMAL HIGH (ref 70–99)
Potassium: 3.6 mmol/L (ref 3.5–5.1)
Sodium: 139 mmol/L (ref 135–145)

## 2024-02-05 LAB — HIV ANTIBODY (ROUTINE TESTING W REFLEX): HIV Screen 4th Generation wRfx: NONREACTIVE

## 2024-02-05 LAB — PHOSPHORUS: Phosphorus: 3.1 mg/dL (ref 2.5–4.6)

## 2024-02-05 LAB — MAGNESIUM: Magnesium: 1.8 mg/dL (ref 1.7–2.4)

## 2024-02-05 MED ORDER — LORAZEPAM 2 MG/ML IJ SOLN: 1.0000 mg | INTRAMUSCULAR | Status: DC | PRN

## 2024-02-05 MED ORDER — LORAZEPAM 2 MG/ML IJ SOLN
8.0000 mg | Freq: Four times a day (QID) | INTRAMUSCULAR | Status: DC | PRN
  Administered 2024-02-05: 8 mg via INTRAVENOUS
  Filled 2024-02-05: qty 4

## 2024-02-05 MED ORDER — FONDAPARINUX SODIUM 2.5 MG/0.5ML ~~LOC~~ SOLN
2.5000 mg | SUBCUTANEOUS | Status: DC
  Administered 2024-02-05 – 2024-02-09 (×5): 2.5 mg via SUBCUTANEOUS
  Filled 2024-02-05 (×6): qty 0.5

## 2024-02-05 MED ORDER — PHENOBARBITAL SODIUM 130 MG/ML IJ SOLN
97.5000 mg | Freq: Three times a day (TID) | INTRAMUSCULAR | Status: DC
  Administered 2024-02-05 – 2024-02-06 (×4): 97.5 mg via INTRAVENOUS
  Filled 2024-02-05 (×4): qty 1

## 2024-02-05 MED ORDER — LACTATED RINGERS IV SOLN
INTRAVENOUS | Status: DC
  Administered 2024-02-05: 1000 mL via INTRAVENOUS

## 2024-02-05 MED ORDER — PHENOBARBITAL SODIUM 65 MG/ML IJ SOLN
65.0000 mg | Freq: Three times a day (TID) | INTRAMUSCULAR | Status: DC
Start: 2024-02-07 — End: 2024-02-09

## 2024-02-05 MED ORDER — HYDROCHLOROTHIAZIDE 25 MG PO TABS
25.0000 mg | ORAL_TABLET | Freq: Every day | ORAL | Status: DC
Start: 2024-02-05 — End: 2024-02-07
  Administered 2024-02-05: 25 mg via ORAL
  Filled 2024-02-05: qty 1

## 2024-02-05 MED ORDER — PHENOBARBITAL SODIUM 65 MG/ML IJ SOLN: 32.5000 mg | Freq: Three times a day (TID) | INTRAMUSCULAR | Status: DC

## 2024-02-05 MED ORDER — LISINOPRIL 20 MG PO TABS
40.0000 mg | ORAL_TABLET | Freq: Every day | ORAL | Status: DC
Start: 2024-02-05 — End: 2024-02-07
  Administered 2024-02-05: 40 mg via ORAL
  Filled 2024-02-05: qty 2

## 2024-02-05 MED ORDER — LISINOPRIL-HYDROCHLOROTHIAZIDE 20-12.5 MG PO TABS: 2.0000 | ORAL_TABLET | Freq: Every day | ORAL | Status: DC

## 2024-02-05 MED ORDER — AMLODIPINE BESYLATE 5 MG PO TABS
5.0000 mg | ORAL_TABLET | Freq: Every day | ORAL | Status: DC
  Administered 2024-02-05: 5 mg via ORAL
  Filled 2024-02-05: qty 1

## 2024-02-05 MED ORDER — GABAPENTIN 100 MG PO CAPS
100.0000 mg | ORAL_CAPSULE | Freq: Three times a day (TID) | ORAL | Status: DC
  Administered 2024-02-05: 100 mg via ORAL
  Filled 2024-02-05 (×2): qty 1

## 2024-02-05 NOTE — Plan of Care (Signed)
  Problem: Education: Goal: Knowledge of General Education information will improve Description: Including pain rating scale, medication(s)/side effects and non-pharmacologic comfort measures Outcome: Progressing   Problem: Clinical Measurements: Goal: Ability to maintain clinical measurements within normal limits will improve Outcome: Progressing Goal: Diagnostic test results will improve Outcome: Progressing Goal: Respiratory complications will improve Outcome: Progressing   Problem: Nutrition: Goal: Adequate nutrition will be maintained Outcome: Progressing   Problem: Coping: Goal: Level of anxiety will decrease Outcome: Progressing   Problem: Safety: Goal: Ability to remain free from injury will improve Outcome: Progressing

## 2024-02-05 NOTE — Progress Notes (Signed)
*  PRELIMINARY RESULTS* Echocardiogram 2D Echocardiogram has been performed.  Bari [name] 02/05/2024, 3:23 PM

## 2024-02-05 NOTE — Progress Notes (Signed)
 PROGRESS NOTE    Edward Weber  FMW:[id number] DOB: 1960/07/14 DOA: 02/04/2024 PCP: [name], MD     Brief Narrative:   From admission h and p  Patient is a 63 years old male with past medical history of hypertension, stroke, history of sickle cell anemia, GERD polysubstance abuse presented to the hospital with complaints of dizziness and passing out spell while he was trying to walk to his bathroom.  He also had some shortness of breath and exertional chest pain and dyspnea on exertion for past few days.  Family was able to catch him and lowered him to the floor but patient complains of pain on the back.  There was report that recently his medications including gabapentin  and amlodipine were removed.  Patient states that he has been smoking cigarettes and he uses cocaine.  Denies any nausea vomiting or abdominal pain.  Denies any urinary urgency frequency or dysuria.  Denies any dizziness lightheadedness currently.  Denies any sick contacts or recent travel.   In the ED, patient had elevated blood pressure at 203/102.  Afebrile with a temperature of 98.1 F.  Labs were notable for normal WBC with hemoglobin of 11.5.  BMP with creatinine of 1.0.  BNP within normal range at 81.  COVID influenza and RSV was negative.  Chest x-ray showed no acute infiltrate.  CT angiogram of the chest showed no evidence of pulmonary embolism.  CT head scan was negative for acute findings.  Patient was then considered for admission to hospital for syncope.  Assessment & Plan:   Principal Problem:   Chest pain Active Problems:   Recurrent syncope   Alcohol abuse   Essential hypertension   GERD without esophagitis   History of CVA (cerebrovascular accident)   T2DM (type 2 diabetes mellitus) (HCC)   Sickle cell anemia (HCC)  # Alcohol use disorder with complicated withdrawal # Agitation Developed agitation and delirium yesterday. Per daughter heavy drinker with history of withdrawal, starts  drinking first thing in the morning. IVCd on 20/24 - continue vitamins - continue ciwa ativan  - add phenobarb taper  # Syncope Suspect related to substance abuse. CT head nothing acute. EKG nothing acute - tte pending from admission  # Substance abuse Alcohol, per h and p patient admitted to cocaine abuse as well. Uds positive for opioids and thc  # Hepatitis c? Carries this dx but don't see test results to confirm - hcv reflex to rna  # sickle cell anemia? Carries this diagnosis but don't see evidence of this - hemoglobin fraction cascade  # T2DM Appears to be a new diagnosis, A1c in the 8s  # HTN Moderate bp elevation - home amlodipine, lisinopril, hctz  # Neuropathy - home gabapentin   # Aortic dilation Incidental on CTA, 4 cm - outpt surveillance  # Alcohol liver disease Cirrhotic liver on CT but no laboratory evidence of such - outpt f/u - hcv eval as above    DVT prophylaxis: lovenox  Code Status: full Family Communication: daughter updated @ bedside 10/25  Level of care: Progressive Status is: Inpatient     Consultants:  psychiatry  Procedures: none  Antimicrobials:  none    Subjective: asleep  Objective: Vitals:   02/05/24 0430 02/05/24 0506 02/05/24 0908 02/05/24 0951  BP: (!) 189/93 (!) 158/117 (!) 166/96 (!) 166/96  Pulse: 81 99 99 99  Resp: 18     Temp: 98.1 F (36.7 C)   98.4 F (36.9 C)  TempSrc:  Oral  Oral  SpO2: 99%   96%  Weight: 82 kg     Height:        Intake/Output Summary (Last 24 hours) at 02/05/2024 1003 Last data filed at 02/05/2024 0616 Gross per 24 hour  Intake 153 ml  Output 1200 ml  Net -1047 ml   Filed Weights   02/04/24 0239 02/05/24 0430  Weight: 91.9 kg 82 kg    Examination:  General exam: Appears calm and comfortable, sleeping Respiratory system: Clear to auscultation. Respiratory effort normal. Cardiovascular system: S1 & S2 heard, RRR.  Gastrointestinal system: Abdomen is obese, soft and  nontender.   Central nervous system: asleep Extremities: warm Skin: No rashes, lesions or ulcers. Depigmented skin Psychiatry: sleeping comfortably    Data Reviewed: I have personally reviewed following labs and imaging studies  CBC: Recent Labs  Lab 02/04/24 0242 02/05/24 0400  WBC 7.3 8.8  NEUTROABS 5.5  --   HGB 11.5* 11.6*  HCT 34.9* 34.1*  MCV 94.3 92.9  PLT 166 169   Basic Metabolic Panel: Recent Labs  Lab 02/04/24 0242 02/05/24 0400  NA 141 139  K 4.0 3.6  CL 106 104  CO2 24 23  GLUCOSE 166* 130*  BUN 12 7*  CREATININE 1.01 1.02  CALCIUM  8.5* 8.9  MG  --  1.8  PHOS  --  3.1   GFR: Estimated Creatinine Clearance: 69.9 mL/min (by C-G formula based on SCr of 1.02 mg/dL). Liver Function Tests: Recent Labs  Lab 02/04/24 0242  AST 28  ALT 22  ALKPHOS 44  BILITOT 0.7  PROT 6.9  ALBUMIN 4.0   No results for input(s): LIPASE, AMYLASE in the last 168 hours. No results for input(s): AMMONIA in the last 168 hours. Coagulation Profile: No results for input(s): INR, PROTIME in the last 168 hours. Cardiac Enzymes: No results for input(s): CKTOTAL, CKMB, CKMBINDEX, TROPONINI in the last 168 hours. BNP (last 3 results) No results for input(s): PROBNP in the last 8760 hours. HbA1C: Recent Labs    02/04/24 1727  HGBA1C 8.1*   CBG: No results for input(s): GLUCAP in the last 168 hours. Lipid Profile: No results for input(s): CHOL, HDL, LDLCALC, TRIG, CHOLHDL, LDLDIRECT in the last 72 hours. Thyroid Function Tests: Recent Labs    02/04/24 0242  TSH 0.844   Anemia Panel: No results for input(s): VITAMINB12, FOLATE, FERRITIN, TIBC, IRON, RETICCTPCT in the last 72 hours. Urine analysis:    Component Value Date/Time   COLORURINE STRAW (A) 02/04/2024 1516   APPEARANCEUR CLEAR (A) 02/04/2024 1516   APPEARANCEUR Clear 01/17/2020 1550   LABSPEC 1.021 02/04/2024 1516   LABSPEC 1.010 04/01/2014 1721   PHURINE  7.0 02/04/2024 1516   GLUCOSEU NEGATIVE 02/04/2024 1516   GLUCOSEU Negative 04/01/2014 1721   HGBUR NEGATIVE 02/04/2024 1516   BILIRUBINUR NEGATIVE 02/04/2024 1516   BILIRUBINUR Negative 01/17/2020 1550   BILIRUBINUR Negative 04/01/2014 1721   KETONESUR NEGATIVE 02/04/2024 1516   PROTEINUR 100 (A) 02/04/2024 1516   NITRITE NEGATIVE 02/04/2024 1516   LEUKOCYTESUR NEGATIVE 02/04/2024 1516   LEUKOCYTESUR Negative 04/01/2014 1721   Sepsis Labs: @LABRCNTIP (procalcitonin:4,lacticidven:4)  ) Recent Results (from the past 240 hours)  Resp panel by RT-PCR (RSV, Flu A&B, Covid) Anterior Nasal Swab     Status: None   Collection Time: 02/04/24  2:55 AM   Specimen: Anterior Nasal Swab  Result Value Ref Range Status   SARS Coronavirus 2 by RT PCR NEGATIVE NEGATIVE Final    Comment: (NOTE) SARS-CoV-2 target nucleic acids  are NOT DETECTED.  The SARS-CoV-2 RNA is generally detectable in upper respiratory specimens during the acute phase of infection. The lowest concentration of SARS-CoV-2 viral copies this assay can detect is 138 copies/mL. A negative result does not preclude SARS-Cov-2 infection and should not be used as the sole basis for treatment or other patient management decisions. A negative result may occur with  improper specimen collection/handling, submission of specimen other than nasopharyngeal swab, presence of viral mutation(s) within the areas targeted by this assay, and inadequate number of viral copies(<138 copies/mL). A negative result must be combined with clinical observations, patient history, and epidemiological information. The expected result is Negative.  Fact Sheet for Patients:  bloggercourse.com  Fact Sheet for Healthcare Providers:  seriousbroker.it  This test is no t yet approved or cleared by the [place]  FDA and  has been authorized for detection and/or diagnosis of SARS-CoV-2 by FDA under an  Emergency Use Authorization (EUA). This EUA will remain  in effect (meaning this test can be used) for the duration of the COVID-19 declaration under Section 564(b)(1) of the Act, 21 U.S.C.section 360bbb-3(b)(1), unless the authorization is terminated  or revoked sooner.       Influenza A by PCR NEGATIVE NEGATIVE Final   Influenza B by PCR NEGATIVE NEGATIVE Final    Comment: (NOTE) The Xpert Xpress SARS-CoV-2/FLU/RSV plus assay is intended as an aid in the diagnosis of influenza from Nasopharyngeal swab specimens and should not be used as a sole basis for treatment. Nasal washings and aspirates are unacceptable for Xpert Xpress SARS-CoV-2/FLU/RSV testing.  Fact Sheet for Patients: bloggercourse.com  Fact Sheet for Healthcare Providers: seriousbroker.it  This test is not yet approved or cleared by the [place]  FDA and has been authorized for detection and/or diagnosis of SARS-CoV-2 by FDA under an Emergency Use Authorization (EUA). This EUA will remain in effect (meaning this test can be used) for the duration of the COVID-19 declaration under Section 564(b)(1) of the Act, 21 U.S.C. section 360bbb-3(b)(1), unless the authorization is terminated or revoked.     Resp Syncytial Virus by PCR NEGATIVE NEGATIVE Final    Comment: (NOTE) Fact Sheet for Patients: bloggercourse.com  Fact Sheet for Healthcare Providers: seriousbroker.it  This test is not yet approved or cleared by the [place]  FDA and has been authorized for detection and/or diagnosis of SARS-CoV-2 by FDA under an Emergency Use Authorization (EUA). This EUA will remain in effect (meaning this test can be used) for the duration of the COVID-19 declaration under Section 564(b)(1) of the Act, 21 U.S.C. section 360bbb-3(b)(1), unless the authorization is terminated or revoked.  Performed at [facility], [address] [place], [place] [address]          Radiology Studies: CT Angio Chest PE W and/or Wo Contrast Result Date: 02/04/2024 EXAM: CTA of the Chest with contrast for PE 02/04/2024 05:05:55 AM TECHNIQUE: CTA of the chest was performed without and with the administration of 100 mL of iohexol  (OMNIPAQUE ) 350 MG/ML injection. Multiplanar reformatted images are provided for review. MIP images are provided for review. Automated exposure control, iterative reconstruction, and/or weight based adjustment of the mA/kV was utilized to reduce the radiation dose to as low as reasonably achievable. COMPARISON: 03/10/2028 CLINICAL HISTORY: Pulmonary embolism (PE) suspected, high prob. Per ed notes; Brought in by EMS due to getting dizzy and passing out while walking to his bedroom. Family caught the patient and lowered him to the floor. Patient did not hit his head or  have any injuries from the fall. No blood thinners or complaints of pain. FINDINGS: PULMONARY ARTERIES: Pulmonary arteries are adequately opacified for evaluation. No pulmonary embolism. Main pulmonary artery is normal in caliber. MEDIASTINUM: The heart demonstrates mild cardiac enlargement. No pericardial effusion. Coronary artery calcifications. Aortic atherosclerosis. Aorta measures 4 cm (image 73/8). No acute findings within the imaged portions of the upper abdomen. Moderate-sized hiatal hernia. LYMPH NODES: No mediastinal, hilar or axillary lymphadenopathy. LUNGS AND PLEURA: The lungs are without acute process. No focal consolidation or pulmonary edema. No pleural effusion or pneumothorax. UPPER ABDOMEN: Morphologic features of the liver compatible with cirrhosis. Aortic atherosclerotic calcifications. Nonobstructing stone within the lower pole of the left kidney measures 7 mm. SOFT TISSUES AND BONES: Fixation hardware identified within the lower cervical spine. No acute soft tissue abnormality. IMPRESSION: 1. No evidence of pulmonary  embolism. 2. Aorta measures 4 cm with atherosclerotic calcifications.Recommend annual imaging followup by CTA or MRA. This recommendation follows 2010 ACCF/AHA/AATS/ACR/ASA/SCA/SCAI/SIR/STS/SVM Guidelines for the Diagnosis and Management of Patients with Thoracic Aortic Disease. Circulation. 2010; 121: Z733-z630. Aortic aneurysm NOS (ICD10-I71.9) 3. Aortic atherosclerosis and coronary artery calcifications. 4. Morphologic features of the liver compatible with cirrhosis. 5. Hiatal hernia. Electronically signed by: Waddell Calk MD 02/04/2024 06:20 AM EDT RP Workstation: [id number]   CT Head Wo Contrast Result Date: 02/04/2024 EXAM: CT HEAD WITHOUT CONTRAST 02/04/2024 05:05:55 AM TECHNIQUE: CT of the head was performed without the administration of intravenous contrast. Automated exposure control, iterative reconstruction, and/or weight based adjustment of the mA/kV was utilized to reduce the radiation dose to as low as reasonably achievable. COMPARISON: Brain MRI 05/11/2022. Head CT 09/18/2022. CLINICAL HISTORY: Mental status change, unknown cause. Brought in by EMS due to getting dizzy and passing out. No head injury or pain. FINDINGS: BRAIN AND VENTRICLES: No acute hemorrhage. No evidence of acute infarct. No hydrocephalus. No extra-axial collection. No mass effect or midline shift. Scattered chronic lacunar infarcts in the bilateral deep gray matter nuclei appear stable from last year. Additional patchy white matter hypodensity in both hemispheres appears stable. Chronic heterogeneity in the pons is stable. Advanced calcified atherosclerosis at the skull base. No suspicious intracranial vascular hyperdensity. Stable noncontrast CT appearance of advanced chronic small vessel disease. ORBITS: No acute abnormality. SINUSES: Paranasal sinuses, middle ears, and mastoids remain well aerated. SOFT TISSUES AND SKULL: No acute soft tissue abnormality. Chronic left lamina papyracea fracture. . Chronic right superior  frontal craniotomy. Small benign left anterior frontal bone exostosis is stable and appears inconsequential. Congenital incomplete ossification of the posterior C1 ring. Partially visible chronic C1-C2 degeneration. IMPRESSION: 1. No acute intracranial abnormality. 2. Stable noncontrast CT appearance of advanced chronic small vessel disease. Electronically signed by: Helayne Hurst MD 02/04/2024 05:47 AM EDT RP Workstation: [id number]   DG Chest Portable 1 View Result Date: 02/04/2024 CLINICAL DATA:  Dizziness and recent syncopal episode EXAM: PORTABLE CHEST 1 VIEW COMPARISON:  01/05/2024 FINDINGS: The heart size and mediastinal contours are within normal limits. Both lungs are clear. The visualized skeletal structures are unremarkable. Postsurgical changes are noted in the cervical spine. IMPRESSION: No active disease. Electronically Signed   By: Oneil Devonshire M.D.   On: 02/04/2024 03:16        Scheduled Meds:  aspirin  EC  81 mg Oral Daily   folic acid   1 mg Oral Daily   multivitamin with minerals  1 tablet Oral Daily   nicotine  21 mg Transdermal Daily   pantoprazole   40 mg Oral Daily   sodium  chloride flush  3 mL Intravenous Q12H   thiamine   100 mg Oral Daily   traZODone  50 mg Oral QHS   Continuous Infusions:   LOS: 1 day     Devaughn KATHEE Ban, MD [facility]   If 7PM-7AM, please contact night-coverage [email or web address] Password [email or web address] 02/05/2024, 10:03 AM

## 2024-02-05 NOTE — Progress Notes (Addendum)
 Psychiatric consultation was attempted again today. However, patient remains cognitively confused, incoherent of his surroundings, only oriented to himself, and unable to engage in a meaningful conversation to complete a clinical psychiatric-mental health evaluation. Patient is still under IVC petition and requires a sitter at his bedside per protocol. Psychiatry will continue to provide consultation and rounds to ensure patient's needs are being met by our team.

## 2024-02-05 NOTE — Progress Notes (Addendum)
 PT Cancellation Note  Patient Details Name: Rivan Siordia MRN: [id number] DOB: 04-Aug-1960   Cancelled Treatment:    Reason Eval/Treat Not Completed: Patient's level of consciousness. PT orders received and pt chart reviewed. RN administering ativan  at this time due to agitation and physical aggression. Agreeable for PT to follow up later for PT eval.  2nd attempt @ 1026: Pt supine in bed, asleep, with sitter at bedside. Pt is obtunded and does not easily awake to noxious stimuli; unable to stay awake without constant noxious stimuli. Will continue efforts for PT eval as appropriate.  3rd attempt @ 1310: Per RN report, pt continues to be agitated and is requiring continued medical management for sedation. Will follow up with PT eval tomorrow, as appropriate.   Camie CHARLENA Kluver, PT, DPT 12:45 PM,02/05/24 Physical Therapist - [place]

## 2024-02-06 ENCOUNTER — Inpatient Hospital Stay

## 2024-02-06 DIAGNOSIS — R079 Chest pain, unspecified: Principal | ICD-10-CM

## 2024-02-06 DIAGNOSIS — F10931 Alcohol use, unspecified with withdrawal delirium: Secondary | ICD-10-CM

## 2024-02-06 DIAGNOSIS — R55 Syncope and collapse: Secondary | ICD-10-CM | POA: Diagnosis not present

## 2024-02-06 LAB — BLOOD GAS, ARTERIAL
Acid-base deficit: 0.3 mmol/L (ref 0.0–2.0)
Bicarbonate: 22.8 mmol/L (ref 20.0–28.0)
FIO2: 40 %
MECHVT: 450 mL
Mechanical Rate: 20
O2 Saturation: 98.6 %
PEEP: 5 cmH2O
Patient temperature: 37
pCO2 arterial: 32 mmHg (ref 32–48)
pH, Arterial: 7.46 — ABNORMAL HIGH (ref 7.35–7.45)
pO2, Arterial: 89 mmHg (ref 83–108)

## 2024-02-06 LAB — BASIC METABOLIC PANEL WITH GFR
Anion gap: 9 (ref 5–15)
BUN: 8 mg/dL (ref 8–23)
CO2: 24 mmol/L (ref 22–32)
Calcium: 8.8 mg/dL — ABNORMAL LOW (ref 8.9–10.3)
Chloride: 103 mmol/L (ref 98–111)
Creatinine, Ser: 1.08 mg/dL (ref 0.61–1.24)
GFR, Estimated: >60 mL/min (ref >60)
Glucose, Bld: 123 mg/dL — ABNORMAL HIGH (ref 70–99)
Potassium: 3.4 mmol/L — ABNORMAL LOW (ref 3.5–5.1)
Sodium: 136 mmol/L (ref 135–145)

## 2024-02-06 LAB — GLUCOSE, CAPILLARY
Glucose-Capillary: 145 mg/dL — ABNORMAL HIGH (ref 70–99)
Glucose-Capillary: 149 mg/dL — ABNORMAL HIGH (ref 70–99)

## 2024-02-06 MED ORDER — CHLORHEXIDINE GLUCONATE CLOTH 2 % EX PADS
6.0000 | MEDICATED_PAD | Freq: Every day | CUTANEOUS | Status: DC
Start: 2024-02-06 — End: 2024-02-10
  Administered 2024-02-06 – 2024-02-09 (×4): 6 via TOPICAL

## 2024-02-06 MED ORDER — FENTANYL CITRATE (PF) 100 MCG/2ML IJ SOLN
200.0000 ug | Freq: Once | INTRAMUSCULAR | Status: AC
  Administered 2024-02-06: 100 ug via INTRAVENOUS
  Filled 2024-02-06: qty 4

## 2024-02-06 MED ORDER — AMLODIPINE BESYLATE 10 MG PO TABS: 10.0000 mg | ORAL_TABLET | Freq: Every day | ORAL | Status: DC

## 2024-02-06 MED ORDER — PROPOFOL 1000 MG/100ML IV EMUL
INTRAVENOUS | Status: AC
Start: 2024-02-06 — End: 2024-02-06
  Filled 2024-02-06: qty 100

## 2024-02-06 MED ORDER — SODIUM CHLORIDE 0.9 % IV SOLN
2.0000 g | INTRAVENOUS | Status: AC
  Administered 2024-02-06 – 2024-02-10 (×5): 2 g via INTRAVENOUS
  Filled 2024-02-06 (×5): qty 20

## 2024-02-06 MED ORDER — LABETALOL HCL 5 MG/ML IV SOLN
20.0000 mg | INTRAVENOUS | Status: DC | PRN
  Administered 2024-02-06 – 2024-02-14 (×13): 20 mg via INTRAVENOUS
  Filled 2024-02-06 (×14): qty 4

## 2024-02-06 MED ORDER — LORAZEPAM 2 MG/ML IJ SOLN: 1.0000 mg | Freq: Four times a day (QID) | INTRAMUSCULAR | Status: DC | PRN

## 2024-02-06 MED ORDER — VECURONIUM BROMIDE 10 MG IV SOLR
20.0000 mg | Freq: Once | INTRAVENOUS | Status: AC
  Administered 2024-02-06: 20 mg via INTRAVENOUS
  Filled 2024-02-06: qty 20

## 2024-02-06 MED ORDER — LORAZEPAM 2 MG/ML IJ SOLN: 4.0000 mg | Freq: Four times a day (QID) | INTRAMUSCULAR | Status: DC | PRN

## 2024-02-06 MED ORDER — POTASSIUM CHLORIDE IN NACL 20-0.9 MEQ/L-% IV SOLN
INTRAVENOUS | Status: AC
  Filled 2024-02-06 (×3): qty 1000

## 2024-02-06 MED ORDER — PROPOFOL 1000 MG/100ML IV EMUL
0.0000 ug/kg/min | INTRAVENOUS | Status: DC
  Administered 2024-02-06: 45 ug/kg/min via INTRAVENOUS
  Administered 2024-02-06: 10 ug/kg/min via INTRAVENOUS
  Administered 2024-02-07: 40 ug/kg/min via INTRAVENOUS
  Administered 2024-02-07: 45 ug/kg/min via INTRAVENOUS
  Filled 2024-02-06 (×3): qty 100

## 2024-02-06 MED ORDER — MIDAZOLAM HCL (PF) 2 MG/2ML IJ SOLN
4.0000 mg | Freq: Once | INTRAMUSCULAR | Status: AC
  Administered 2024-02-06: 4 mg via INTRAVENOUS
  Filled 2024-02-06: qty 4

## 2024-02-06 MED ORDER — AMLODIPINE BESYLATE 5 MG PO TABS
5.0000 mg | ORAL_TABLET | Freq: Once | ORAL | Status: DC
Start: 2024-02-06 — End: 2024-02-07

## 2024-02-06 NOTE — Progress Notes (Addendum)
 PROGRESS NOTE    Edward Weber  FMW:[id number] DOB: 1960-05-02 DOA: 02/04/2024 PCP: [name], MD     Brief Narrative:   From admission h and p  Patient is a 63 years old male with past medical history of hypertension, stroke, history of sickle cell anemia, GERD polysubstance abuse presented to the hospital with complaints of dizziness and passing out spell while he was trying to walk to his bathroom.  He also had some shortness of breath and exertional chest pain and dyspnea on exertion for past few days.  Family was able to catch him and lowered him to the floor but patient complains of pain on the back.  There was report that recently his medications including gabapentin  and amlodipine were removed.  Patient states that he has been smoking cigarettes and he uses cocaine.  Denies any nausea vomiting or abdominal pain.  Denies any urinary urgency frequency or dysuria.  Denies any dizziness lightheadedness currently.  Denies any sick contacts or recent travel.   In the ED, patient had elevated blood pressure at 203/102.  Afebrile with a temperature of 98.1 F.  Labs were notable for normal WBC with hemoglobin of 11.5.  BMP with creatinine of 1.0.  BNP within normal range at 81.  COVID influenza and RSV was negative.  Chest x-ray showed no acute infiltrate.  CT angiogram of the chest showed no evidence of pulmonary embolism.  CT head scan was negative for acute findings.  Patient was then considered for admission to hospital for syncope.  Assessment & Plan:   Principal Problem:   Alcohol withdrawal with delirium (HCC) Active Problems:   Recurrent syncope   Chest pain   Alcohol abuse   Essential hypertension   GERD without esophagitis   History of CVA (cerebrovascular accident)   T2DM (type 2 diabetes mellitus) (HCC)   Sickle cell anemia (HCC)  # Alcohol use disorder with complicated withdrawal Developed agitation and delirium day of admission. Per daughter heavy drinker with  history of withdrawal, starts drinking first thing in the morning. IVCd on 20/24. Today somnolent, this afternoon labored breathing - continue vitamins - continue ciwa ativan  - added phenobarb taper 10/25, will stop given concern for over-sedation, respiratory effects - continue sitter  # Tachypnea New this afternoon, concern for respiratory effects of benzodiazepines, pnehobarb - d/c phenobarb as above - cmp, vbg, lactate, cxr  # Syncope Suspect related to substance abuse. CT head nothing acute. EKG nothing acute - tte pending from admission  # Chest pain Negative w/u with serial troponins and echocardiogram and EKG - monitor  # Substance abuse Alcohol, per h and p patient admitted to cocaine abuse as well. Uds positive for opioids and thc  # Hepatitis c? Carries this dx but don't see test results to confirm - hcv reflex to rna  # sickle cell anemia? Carries this diagnosis but don't see evidence of this - hemoglobin fraction cascade  # T2DM Appears to be a new diagnosis, A1c in the 8s. Glucose here mild elevation - monitor  # HTN Labile BPs - home amlodipine, lisinopril, hydrochlorothiazide  - labetalol  prn  # Neuropathy - home gabapentin   # Aortic dilation Incidental on CTA, 4 cm - outpt surveillance  # Alcohol liver disease Cirrhotic liver on CT but no laboratory evidence of such - outpt f/u - hcv eval as above    DVT prophylaxis: lovenox  Code Status: full Family Communication: daughter shereda updated telephonically 10/26  Level of care: Progressive Status is: Inpatient  Consultants:  psychiatry  Procedures: none  Antimicrobials:  none    Subjective: Asleep, intermittently agitated overnight, now has sitter  Objective: Vitals:   02/06/24 0114 02/06/24 0312 02/06/24 0406 02/06/24 0955  BP: (!) 165/95 (!) 161/86 (!) 143/74 (!) 178/113  Pulse: 99 99 100 (!) 108  Resp: 20  (!) 24 (!) 24  Temp:   98.8 F (37.1 C) 98.8 F (37.1 C)   TempSrc:   Oral Oral  SpO2: 94% 97% 97% 96%  Weight:   81.7 kg   Height:        Intake/Output Summary (Last 24 hours) at 02/06/2024 1105 Last data filed at 02/06/2024 0737 Gross per 24 hour  Intake 1905.63 ml  Output 2000 ml  Net -94.37 ml   Filed Weights   02/04/24 0239 02/05/24 0430 02/06/24 0406  Weight: 91.9 kg 82 kg 81.7 kg    Examination:  General exam: Appears calm and comfortable, sleeping Respiratory system: Clear to auscultation. Respiratory effort normal. Cardiovascular system: S1 & S2 heard, RRR.  Gastrointestinal system: Abdomen is obese, soft and nontender.   Central nervous system: asleep Extremities: warm Skin: No rashes, lesions or ulcers. Depigmented skin Psychiatry: sleeping comfortably    Data Reviewed: I have personally reviewed following labs and imaging studies  CBC: Recent Labs  Lab 02/04/24 0242 02/05/24 0400  WBC 7.3 8.8  NEUTROABS 5.5  --   HGB 11.5* 11.6*  HCT 34.9* 34.1*  MCV 94.3 92.9  PLT 166 169   Basic Metabolic Panel: Recent Labs  Lab 02/04/24 0242 02/05/24 0400 02/06/24 0353  NA 141 139 136  K 4.0 3.6 3.4*  CL 106 104 103  CO2 24 23 24   GLUCOSE 166* 130* 123*  BUN 12 7* 8  CREATININE 1.01 1.02 1.08  CALCIUM  8.5* 8.9 8.8*  MG  --  1.8  --   PHOS  --  3.1  --    GFR: Estimated Creatinine Clearance: 66 mL/min (by C-G formula based on SCr of 1.08 mg/dL). Liver Function Tests: Recent Labs  Lab 02/04/24 0242  AST 28  ALT 22  ALKPHOS 44  BILITOT 0.7  PROT 6.9  ALBUMIN 4.0   No results for input(s): LIPASE, AMYLASE in the last 168 hours. No results for input(s): AMMONIA in the last 168 hours. Coagulation Profile: No results for input(s): INR, PROTIME in the last 168 hours. Cardiac Enzymes: No results for input(s): CKTOTAL, CKMB, CKMBINDEX, TROPONINI in the last 168 hours. BNP (last 3 results) No results for input(s): PROBNP in the last 8760 hours. HbA1C: Recent Labs    02/04/24 1727   HGBA1C 8.1*   CBG: No results for input(s): GLUCAP in the last 168 hours. Lipid Profile: No results for input(s): CHOL, HDL, LDLCALC, TRIG, CHOLHDL, LDLDIRECT in the last 72 hours. Thyroid Function Tests: Recent Labs    02/04/24 0242  TSH 0.844   Anemia Panel: No results for input(s): VITAMINB12, FOLATE, FERRITIN, TIBC, IRON, RETICCTPCT in the last 72 hours. Urine analysis:    Component Value Date/Time   COLORURINE STRAW (A) 02/04/2024 1516   APPEARANCEUR CLEAR (A) 02/04/2024 1516   APPEARANCEUR Clear 01/17/2020 1550   LABSPEC 1.021 02/04/2024 1516   LABSPEC 1.010 04/01/2014 1721   PHURINE 7.0 02/04/2024 1516   GLUCOSEU NEGATIVE 02/04/2024 1516   GLUCOSEU Negative 04/01/2014 1721   HGBUR NEGATIVE 02/04/2024 1516   BILIRUBINUR NEGATIVE 02/04/2024 1516   BILIRUBINUR Negative 01/17/2020 1550   BILIRUBINUR Negative 04/01/2014 1721   KETONESUR NEGATIVE 02/04/2024 1516  PROTEINUR 100 (A) 02/04/2024 1516   NITRITE NEGATIVE 02/04/2024 1516   LEUKOCYTESUR NEGATIVE 02/04/2024 1516   LEUKOCYTESUR Negative 04/01/2014 1721   Sepsis Labs: @LABRCNTIP (procalcitonin:4,lacticidven:4)  ) Recent Results (from the past 240 hours)  Resp panel by RT-PCR (RSV, Flu A&B, Covid) Anterior Nasal Swab     Status: None   Collection Time: 02/04/24  2:55 AM   Specimen: Anterior Nasal Swab  Result Value Ref Range Status   SARS Coronavirus 2 by RT PCR NEGATIVE NEGATIVE Final    Comment: (NOTE) SARS-CoV-2 target nucleic acids are NOT DETECTED.  The SARS-CoV-2 RNA is generally detectable in upper respiratory specimens during the acute phase of infection. The lowest concentration of SARS-CoV-2 viral copies this assay can detect is 138 copies/mL. A negative result does not preclude SARS-Cov-2 infection and should not be used as the sole basis for treatment or other patient management decisions. A negative result may occur with  improper specimen collection/handling,  submission of specimen other than nasopharyngeal swab, presence of viral mutation(s) within the areas targeted by this assay, and inadequate number of viral copies(<138 copies/mL). A negative result must be combined with clinical observations, patient history, and epidemiological information. The expected result is Negative.  Fact Sheet for Patients:  bloggercourse.com  Fact Sheet for Healthcare Providers:  seriousbroker.it  This test is no t yet approved or cleared by the [place]  FDA and  has been authorized for detection and/or diagnosis of SARS-CoV-2 by FDA under an Emergency Use Authorization (EUA). This EUA will remain  in effect (meaning this test can be used) for the duration of the COVID-19 declaration under Section 564(b)(1) of the Act, 21 U.S.C.section 360bbb-3(b)(1), unless the authorization is terminated  or revoked sooner.       Influenza A by PCR NEGATIVE NEGATIVE Final   Influenza B by PCR NEGATIVE NEGATIVE Final    Comment: (NOTE) The Xpert Xpress SARS-CoV-2/FLU/RSV plus assay is intended as an aid in the diagnosis of influenza from Nasopharyngeal swab specimens and should not be used as a sole basis for treatment. Nasal washings and aspirates are unacceptable for Xpert Xpress SARS-CoV-2/FLU/RSV testing.  Fact Sheet for Patients: bloggercourse.com  Fact Sheet for Healthcare Providers: seriousbroker.it  This test is not yet approved or cleared by the [place]  FDA and has been authorized for detection and/or diagnosis of SARS-CoV-2 by FDA under an Emergency Use Authorization (EUA). This EUA will remain in effect (meaning this test can be used) for the duration of the COVID-19 declaration under Section 564(b)(1) of the Act, 21 U.S.C. section 360bbb-3(b)(1), unless the authorization is terminated or revoked.     Resp Syncytial Virus by PCR NEGATIVE  NEGATIVE Final    Comment: (NOTE) Fact Sheet for Patients: bloggercourse.com  Fact Sheet for Healthcare Providers: seriousbroker.it  This test is not yet approved or cleared by the [place]  FDA and has been authorized for detection and/or diagnosis of SARS-CoV-2 by FDA under an Emergency Use Authorization (EUA). This EUA will remain in effect (meaning this test can be used) for the duration of the COVID-19 declaration under Section 564(b)(1) of the Act, 21 U.S.C. section 360bbb-3(b)(1), unless the authorization is terminated or revoked.  Performed at [facility], [address], [place], [place] [address]          Radiology Studies: ECHOCARDIOGRAM COMPLETE Result Date: 02/05/2024    ECHOCARDIOGRAM REPORT   Patient Name:   Edward Weber Date of Exam: 02/05/2024 Medical Rec #:  [id number]  Height:       68.0 in Accession #:    [id number]       Weight:       180.8 lb Date of Birth:  [birth year]        BSA:          1.958 m Patient Age:    63 years         BP:           158/117 mmHg Patient Gender: M                HR:           101 bpm. Exam Location:  [facility] Procedure: 2D Echo, Cardiac Doppler and Color Doppler (Both Spectral and Color            Flow Doppler were utilized during procedure). Indications:     Syncope R55  History:         Patient has prior history of Echocardiogram examinations.                  Stroke.  Sonographer:     Bari Roar Referring Phys:  [id number] POKHREL Diagnosing Phys: Evalene Lunger MD IMPRESSIONS  1. Left ventricular ejection fraction, by estimation, is 60 to 65%. The left ventricle has normal function. The left ventricle has no regional wall motion abnormalities. There is mild left ventricular hypertrophy. Left ventricular diastolic parameters are consistent with Grade I diastolic dysfunction (impaired relaxation).  2. Right ventricular systolic function is normal. The right  ventricular size is normal. Tricuspid regurgitation signal is inadequate for assessing PA pressure.  3. The mitral valve is normal in structure. Mild mitral valve regurgitation. No evidence of mitral stenosis.  4. The aortic valve is normal in structure. Aortic valve regurgitation is mild. No aortic stenosis is present.  5. There is borderline dilatation of the ascending aorta, measuring 39 mm.  6. The inferior vena cava is normal in size with greater than 50% respiratory variability, suggesting right atrial pressure of 3 mmHg. FINDINGS  Left Ventricle: Left ventricular ejection fraction, by estimation, is 60 to 65%. The left ventricle has normal function. The left ventricle has no regional wall motion abnormalities. Strain was performed and the global longitudinal strain is indeterminate. The left ventricular internal cavity size was normal in size. There is mild left ventricular hypertrophy. Left ventricular diastolic parameters are consistent with Grade I diastolic dysfunction (impaired relaxation). Right Ventricle: The right ventricular size is normal. No increase in right ventricular wall thickness. Right ventricular systolic function is normal. Tricuspid regurgitation signal is inadequate for assessing PA pressure. Left Atrium: Left atrial size was normal in size. Right Atrium: Right atrial size was normal in size. Pericardium: There is no evidence of pericardial effusion. Mitral Valve: The mitral valve is normal in structure. Mild mitral valve regurgitation. No evidence of mitral valve stenosis. MV peak gradient, 5.5 mmHg. The mean mitral valve gradient is 2.0 mmHg. Tricuspid Valve: The tricuspid valve is normal in structure. Tricuspid valve regurgitation is mild . No evidence of tricuspid stenosis. Aortic Valve: The aortic valve is normal in structure. Aortic valve regurgitation is mild. Aortic regurgitation PHT measures 534 msec. No aortic stenosis is present. Aortic valve mean gradient measures 3.0 mmHg.  Aortic valve peak gradient measures 5.7 mmHg. Aortic valve area, by VTI measures 5.91 cm. Pulmonic Valve: The pulmonic valve was normal in structure. Pulmonic valve regurgitation is not visualized. No evidence of pulmonic stenosis. Aorta: The aortic root is  normal in size and structure. There is borderline dilatation of the ascending aorta, measuring 39 mm. Venous: The inferior vena cava is normal in size with greater than 50% respiratory variability, suggesting right atrial pressure of 3 mmHg. IAS/Shunts: No atrial level shunt detected by color flow Doppler. Additional Comments: 3D was performed not requiring image post processing on an independent workstation and was indeterminate.  LEFT VENTRICLE PLAX 2D LVIDd:         4.50 cm   Diastology LVIDs:         3.20 cm   LV e' medial:    5.66 cm/s LV PW:         1.30 cm   LV E/e' medial:  9.7 LV IVS:        1.40 cm   LV e' lateral:   7.72 cm/s LVOT diam:     2.50 cm   LV E/e' lateral: 7.1 LV SV:         96 LV SV Index:   49 LVOT Area:     4.91 cm  RIGHT VENTRICLE RV Basal diam:  2.40 cm RV Mid diam:    2.20 cm RV S prime:     30.20 cm/s TAPSE (M-mode): 3.4 cm LEFT ATRIUM             Index        RIGHT ATRIUM           Index LA diam:        3.50 cm 1.79 cm/m   RA Area:     13.10 cm LA Vol (A2C):   56.7 ml 28.96 ml/m  RA Volume:   29.30 ml  14.97 ml/m LA Vol (A4C):   40.1 ml 20.48 ml/m LA Biplane Vol: 47.6 ml 24.31 ml/m  AORTIC VALVE                    PULMONIC VALVE AV Area (Vmax):    4.58 cm     PV Vmax:          1.01 m/s AV Area (Vmean):   4.59 cm     PV Peak grad:     4.1 mmHg AV Area (VTI):     5.91 cm     PR End Diast Vel: 5.66 msec AV Vmax:           119.00 cm/s  RVOT Peak grad:   2 mmHg AV Vmean:          76.000 cm/s AV VTI:            0.162 m AV Peak Grad:      5.7 mmHg AV Mean Grad:      3.0 mmHg LVOT Vmax:         111.00 cm/s LVOT Vmean:        71.000 cm/s LVOT VTI:          0.195 m LVOT/AV VTI ratio: 1.20 AI PHT:            534 msec  AORTA Ao Root  diam: 3.30 cm Ao Asc diam:  3.90 cm MITRAL VALVE MV Area (PHT): 6.54 cm    SHUNTS MV Area VTI:   6.89 cm    Systemic VTI:  0.20 m MV Peak grad:  5.5 mmHg    Systemic Diam: 2.50 cm MV Mean grad:  2.0 mmHg MV Vmax:       1.17 m/s MV Vmean:      70.7 cm/s MV Decel Time:  116 msec MV E velocity: 55.00 cm/s MV A velocity: 92.20 cm/s MV E/A ratio:  0.60 MV A Prime:    14.7 cm/s Evalene Lunger MD Electronically signed by Evalene Lunger MD Signature Date/Time: 02/05/2024/4:47:39 PM    Final         Scheduled Meds:  amLODipine  5 mg Oral Daily   folic acid   1 mg Oral Daily   fondaparinux (ARIXTRA) injection  2.5 mg Subcutaneous Q24H   gabapentin   100 mg Oral TID   lisinopril  40 mg Oral Daily   And   hydrochlorothiazide   25 mg Oral Daily   multivitamin with minerals  1 tablet Oral Daily   nicotine  21 mg Transdermal Daily   pantoprazole   40 mg Oral Daily   PHENObarbital  97.5 mg Intravenous Q8H   Followed by   NOREEN ON 02/07/2024] PHENObarbital  65 mg Intravenous Q8H   Followed by   NOREEN ON 02/09/2024] PHENObarbital  32.5 mg Intravenous Q8H   thiamine   100 mg Oral Daily   traZODone  50 mg Oral QHS   Continuous Infusions:  lactated ringers  100 mL/hr at 02/06/24 0737     LOS: 2 days     Devaughn KATHEE Ban, MD [facility]   If 7PM-7AM, please contact night-coverage [email or web address] Password [email or web address] 02/06/2024, 11:05 AM

## 2024-02-06 NOTE — Procedures (Signed)
 Endotracheal Intubation: Patient required placement of an artificial airway secondary to Respiratory Failure  Consent: Emergent.   Hand washing performed prior to starting the procedure.   Medications administered for sedation prior to procedure:  Midazolam 4 mg IV,  Vecuronium 20 mg IV, Fentanyl 200 mcg IV.    A time out procedure was called and correct patient, name, & ID confirmed. Needed supplies and equipment were assembled and checked to include ETT, 10 ml syringe, Glidescope, Mac and Miller blades, suction, oxygen and bag mask valve, end tidal CO2 monitor.   Patient was positioned to align the mouth and pharynx to facilitate visualization of the glottis.   Heart rate, SpO2 and blood pressure was continuously monitored during the procedure. Pre-oxygenation was conducted prior to intubation and endotracheal tube was placed through the vocal cords into the trachea.     The artificial airway was placed under direct visualization via glidescope route using a 8.0 ETT on the first attempt.  ETT was secured at 23 cm mark.  Placement was confirmed by auscuitation of lungs with good breath sounds bilaterally and no stomach sounds.  Condensation was noted on endotracheal tube.   Pulse ox 98%.  CO2 detector in place with appropriate color change.   Complications: None .   Operator: Allayah Raineri.   Chest radiograph ordered and pending.     Lucie Leather, M.D.  [facility]  Medical Director [facility] Medical Director [facility] Cardio-Pulmonary Department

## 2024-02-06 NOTE — Consult Note (Incomplete)
 NAME:  Edward Weber, MRN:  [id number], DOB:  02/16/1961, LOS: 2 ADMISSION DATE:  02/04/2024, CONSULTATION DATE:  02/06/24 REFERRING MD: Kandis Asa REASON FOR CONSULT:  Acute Respiratory Distress   HPI  63 y.o male with significant PMH of HTN, CVA, sickle cell anemia, GERD, polysubstance abuse (EtOH abuse disorder, cocaine, marijuana tobacco abuse), and recurrent syncope who presented to the ED with chief complaints of syncopal episode. Per chart review, he has been having exertional chest pain, DOP for the past few days and today felt dizzy and passed out without hitting his head.  ED Course: Initial vital signs showed HR of 84beats/minute, BP203/102 mm Hg, the RR 20 breaths/minute, 98%O2 Sat on and Temp of 98.42F (36.7C).   Pertinent Labs/Diagnostics Findings: Na+/ K+:141/4.0 Glucose:166  WBC:7.3 K/L Hgb/Hct:11.5/34.9  COVID PCR: Negative,  UDS+ Marijuana CXR> CTH> CTA Chest> CT Abd/pelvis>negative for acute abnormality  Admitted to TRH service for further management of syncopal episode. SEE SIGNIFICANT HOSPITAL EVENTS BELOW.   Past Medical History    Alcoholism (HCC)     Allergic rhinitis     Allergic state     Anemia     Arthritis     Cancer (HCC)     Chronic hepatitis C (HCC)     Depression     Esophagitis, reflux     GERD (gastroesophageal reflux disease)     Gout     Helicobacter pylori gastritis     Hepatitis     Hypertension     Neuromuscular disorder (HCC)     Osteoarthrosis     Peripheral neuropathy     Rhabdomyolysis     Sickle cell anemia (HCC)     Stroke [facility])     Stroke [facility])     Vitiligo    Significant Hospital Events   10/24: Admitted to TRH service for syncopal workup.  Psych consulted for agitation and threatening to leave AMA. Due to history of polysubstance abuse and AMS patient was IVCd 10/25: Remains IVC due to agitation and delirium.  Phenobarb taper added per primary team. 10/26: Transferred to the ICU for worsening somnolent and  increased work of breathing requiring emergent intubation.  Consults:  PCCM Psychiatric  Procedures:  10/26: Endotracheal intubation 10/26: Central Line placement  Interim History / Subjective:    -Remains intubated and sedated  Micro Data:  10/24: SARS-CoV-2 PCR> negative 10/24: Influenza PCR> negative 10:27 Respiratory Viral Panel> 10/27: Blood culture x2> 10/27: MRSA PCR>>  10/27: Strep pneumo Ur Ag 10/27: Legionella Ur Ag>  Antimicrobials:  Vancomycin Cefepime Azithromycin Ceftriaxone Metronidazole  OBJECTIVE  Blood pressure (!) 217/115, pulse 99, temperature 99 F (37.2 C), temperature source Axillary, resp. rate 20, height 5' 8 (1.727 m), weight 79 kg, SpO2 96%.    Vent Mode: PRVC FiO2 (%):  [40 %] 40 % Set Rate:  [20 bmp] 20 bmp Vt Set:  [450 mL] 450 mL PEEP:  [5 cmH20] 5 cmH20 Plateau Pressure:  [15 cmH20] 15 cmH20   Intake/Output Summary (Last 24 hours) at 02/06/2024 2002 Last data filed at 02/06/2024 1424 Gross per 24 hour  Intake 2106.23 ml  Output 1200 ml  Net 906.23 ml   Filed Weights   02/05/24 0430 02/06/24 0406 02/06/24 1825  Weight: 82 kg 81.7 kg 79 kg    Physical Examination  GEN: Critically ill patient, WDWN in NAD intubated and sedated HEENT: [place]/AT. PERRL, sclerae anicteric. Bloody oral secretions HEART: regular rhythm, normal rate, S1, S2, no M/R/G,  LUNGS: CTAB,  mild crackles without wheezes, no increased WOB,  EXTREMITIES: No Edema, cap refill  NEURO: No gross focal deficits. PSYCH: UTA, intubated and sedated ABDOMINAL: Soft: BS x 4, NTND SKIN: Intact, warm, no rashes lesion, or ulcer  Labs/imaging that I havepersonally reviewed  (right click and Reselect all SmartList Selections daily)   DG Chest Port 1 View Result Date: 02/06/2024 EXAM: 1 VIEW(S) XRAY OF THE CHEST 02/06/2024 07:30:00 PM COMPARISON: 02/06/2024 CLINICAL HISTORY: Central line placement FINDINGS: LINES, TUBES AND DEVICES: Right internal jugular central  venous catheter in place with tip in the right atrium. Endotracheal tube in place with tip 2 cm above the carina. LUNGS AND PLEURA: No focal pulmonary opacity. No pulmonary edema. No pleural effusion. No pneumothorax. HEART AND MEDIASTINUM: No acute abnormality of the cardiac and mediastinal silhouettes. BONES AND SOFT TISSUES: No acute osseous abnormality. IMPRESSION: 1. Right internal jugular central venous catheter with tip in the right atrium, appropriate position. 2. Endotracheal tube with tip 2 cm above the carina, appropriate position. Electronically signed by: Pinkie Pebbles MD 02/06/2024 07:31 PM EDT RP Workstation: [id number]   DG Chest Port 1 View Result Date: 02/06/2024 CLINICAL DATA:  Dyspnea. EXAM: PORTABLE CHEST 1 VIEW COMPARISON:  Radiograph and CT 02/04/2024 FINDINGS: Lung volumes are low.The cardiomediastinal contours are stable allowing for differences in technique. Pulmonary vasculature is normal. No consolidation, pleural effusion, or pneumothorax. No acute osseous abnormalities are seen. IMPRESSION: Low lung volumes without acute abnormality. Electronically Signed   By: Andrea Gasman M.D.   On: 02/06/2024 18:28   ECHOCARDIOGRAM COMPLETE Result Date: 02/05/2024    ECHOCARDIOGRAM REPORT   Patient Name:   Edward Weber Date of Exam: 02/05/2024 Medical Rec #:  [id number]        Height:       68.0 in Accession #:    [id number]       Weight:       180.8 lb Date of Birth:  1960/07/18        BSA:          1.958 m Patient Age:    63 years         BP:           158/117 mmHg Patient Gender: M                HR:           101 bpm. Exam Location:  [facility] Procedure: 2D Echo, Cardiac Doppler and Color Doppler (Both Spectral and Color            Flow Doppler were utilized during procedure). Indications:     Syncope R55  History:         Patient has prior history of Echocardiogram examinations.                  Stroke.  Sonographer:     Bari Roar Referring Phys:  [id number] POKHREL  Diagnosing Phys: Evalene Lunger MD IMPRESSIONS  1. Left ventricular ejection fraction, by estimation, is 60 to 65%. The left ventricle has normal function. The left ventricle has no regional wall motion abnormalities. There is mild left ventricular hypertrophy. Left ventricular diastolic parameters are consistent with Grade I diastolic dysfunction (impaired relaxation).  2. Right ventricular systolic function is normal. The right ventricular size is normal. Tricuspid regurgitation signal is inadequate for assessing PA pressure.  3. The mitral valve is normal in structure. Mild mitral valve regurgitation. No evidence of mitral stenosis.  4. The aortic  valve is normal in structure. Aortic valve regurgitation is mild. No aortic stenosis is present.  5. There is borderline dilatation of the ascending aorta, measuring 39 mm.  6. The inferior vena cava is normal in size with greater than 50% respiratory variability, suggesting right atrial pressure of 3 mmHg. FINDINGS  Left Ventricle: Left ventricular ejection fraction, by estimation, is 60 to 65%. The left ventricle has normal function. The left ventricle has no regional wall motion abnormalities. Strain was performed and the global longitudinal strain is indeterminate. The left ventricular internal cavity size was normal in size. There is mild left ventricular hypertrophy. Left ventricular diastolic parameters are consistent with Grade I diastolic dysfunction (impaired relaxation). Right Ventricle: The right ventricular size is normal. No increase in right ventricular wall thickness. Right ventricular systolic function is normal. Tricuspid regurgitation signal is inadequate for assessing PA pressure. Left Atrium: Left atrial size was normal in size. Right Atrium: Right atrial size was normal in size. Pericardium: There is no evidence of pericardial effusion. Mitral Valve: The mitral valve is normal in structure. Mild mitral valve regurgitation. No evidence of mitral  valve stenosis. MV peak gradient, 5.5 mmHg. The mean mitral valve gradient is 2.0 mmHg. Tricuspid Valve: The tricuspid valve is normal in structure. Tricuspid valve regurgitation is mild . No evidence of tricuspid stenosis. Aortic Valve: The aortic valve is normal in structure. Aortic valve regurgitation is mild. Aortic regurgitation PHT measures 534 msec. No aortic stenosis is present. Aortic valve mean gradient measures 3.0 mmHg. Aortic valve peak gradient measures 5.7 mmHg. Aortic valve area, by VTI measures 5.91 cm. Pulmonic Valve: The pulmonic valve was normal in structure. Pulmonic valve regurgitation is not visualized. No evidence of pulmonic stenosis. Aorta: The aortic root is normal in size and structure. There is borderline dilatation of the ascending aorta, measuring 39 mm. Venous: The inferior vena cava is normal in size with greater than 50% respiratory variability, suggesting right atrial pressure of 3 mmHg. IAS/Shunts: No atrial level shunt detected by color flow Doppler. Additional Comments: 3D was performed not requiring image post processing on an independent workstation and was indeterminate.  LEFT VENTRICLE PLAX 2D LVIDd:         4.50 cm   Diastology LVIDs:         3.20 cm   LV e' medial:    5.66 cm/s LV PW:         1.30 cm   LV E/e' medial:  9.7 LV IVS:        1.40 cm   LV e' lateral:   7.72 cm/s LVOT diam:     2.50 cm   LV E/e' lateral: 7.1 LV SV:         96 LV SV Index:   49 LVOT Area:     4.91 cm  RIGHT VENTRICLE RV Basal diam:  2.40 cm RV Mid diam:    2.20 cm RV S prime:     30.20 cm/s TAPSE (M-mode): 3.4 cm LEFT ATRIUM             Index        RIGHT ATRIUM           Index LA diam:        3.50 cm 1.79 cm/m   RA Area:     13.10 cm LA Vol (A2C):   56.7 ml 28.96 ml/m  RA Volume:   29.30 ml  14.97 ml/m LA Vol (A4C):   40.1 ml 20.48 ml/m LA  Biplane Vol: 47.6 ml 24.31 ml/m  AORTIC VALVE                    PULMONIC VALVE AV Area (Vmax):    4.58 cm     PV Vmax:          1.01 m/s AV Area  (Vmean):   4.59 cm     PV Peak grad:     4.1 mmHg AV Area (VTI):     5.91 cm     PR End Diast Vel: 5.66 msec AV Vmax:           119.00 cm/s  RVOT Peak grad:   2 mmHg AV Vmean:          76.000 cm/s AV VTI:            0.162 m AV Peak Grad:      5.7 mmHg AV Mean Grad:      3.0 mmHg LVOT Vmax:         111.00 cm/s LVOT Vmean:        71.000 cm/s LVOT VTI:          0.195 m LVOT/AV VTI ratio: 1.20 AI PHT:            534 msec  AORTA Ao Root diam: 3.30 cm Ao Asc diam:  3.90 cm MITRAL VALVE MV Area (PHT): 6.54 cm    SHUNTS MV Area VTI:   6.89 cm    Systemic VTI:  0.20 m MV Peak grad:  5.5 mmHg    Systemic Diam: 2.50 cm MV Mean grad:  2.0 mmHg MV Vmax:       1.17 m/s MV Vmean:      70.7 cm/s MV Decel Time: 116 msec MV E velocity: 55.00 cm/s MV A velocity: 92.20 cm/s MV E/A ratio:  0.60 MV A Prime:    14.7 cm/s Evalene Lunger MD Electronically signed by Evalene Lunger MD Signature Date/Time: 02/05/2024/4:47:39 PM    Final     Labs   CBC: Recent Labs  Lab 02/04/24 0242 02/05/24 0400  WBC 7.3 8.8  NEUTROABS 5.5  --   HGB 11.5* 11.6*  HCT 34.9* 34.1*  MCV 94.3 92.9  PLT 166 169    Basic Metabolic Panel: Recent Labs  Lab 02/04/24 0242 02/05/24 0400 02/06/24 0353  NA 141 139 136  K 4.0 3.6 3.4*  CL 106 104 103  CO2 24 23 24   GLUCOSE 166* 130* 123*  BUN 12 7* 8  CREATININE 1.01 1.02 1.08  CALCIUM  8.5* 8.9 8.8*  MG  --  1.8  --   PHOS  --  3.1  --    GFR: Estimated Creatinine Clearance: 66 mL/min (by C-G formula based on SCr of 1.08 mg/dL). Recent Labs  Lab 02/04/24 0242 02/05/24 0400  WBC 7.3 8.8    Liver Function Tests: Recent Labs  Lab 02/04/24 0242  AST 28  ALT 22  ALKPHOS 44  BILITOT 0.7  PROT 6.9  ALBUMIN 4.0   No results for input(s): LIPASE, AMYLASE in the last 168 hours. No results for input(s): AMMONIA in the last 168 hours.  ABG No results found for: PHART, PCO2ART, PO2ART, HCO3, TCO2, ACIDBASEDEF, O2SAT   Coagulation Profile: No results for  input(s): INR, PROTIME in the last 168 hours.  Cardiac Enzymes: No results for input(s): CKTOTAL, CKMB, CKMBINDEX, TROPONINI in the last 168 hours.  HbA1C: Hgb A1c MFr Bld  Date/Time Value Ref Range Status  02/04/2024 05:27  PM 8.1 (H) 4.8 - 5.6 % Final    Comment:    (NOTE) Diagnosis of Diabetes The following HbA1c ranges recommended by the American Diabetes Association (ADA) may be used as an aid in the diagnosis of diabetes mellitus.  Hemoglobin             Suggested A1C NGSP%              Diagnosis  <5.7                   Non Diabetic  5.7-6.4                Pre-Diabetic  >6.4                   Diabetic  <7.0                   Glycemic control for                       adults with diabetes.    05/18/2012 10:08 AM 5.7 (H) <5.7 % Final    Comment:    (NOTE)                                                                       According to the ADA Clinical Practice Recommendations for 2011, when HbA1c is used as a screening test:  >=6.5%   Diagnostic of Diabetes Mellitus           (if abnormal result is confirmed) 5.7-6.4%   Increased risk of developing Diabetes Mellitus References:Diagnosis and Classification of Diabetes Mellitus,Diabetes Care,2011,34(Suppl 1):S62-S69 and Standards of Medical Care in         Diabetes - 2011,Diabetes Care,2011,34 (Suppl 1):S11-S61.   CBG: Recent Labs  Lab 02/06/24 1814 02/06/24 1827  GLUCAP 145* 149*   Review of Systems:   Unable to be obtained secondary to the patient's intubated and sedated status.   Past Medical History  He,  has a past medical history of Alcoholism (HCC), Allergic rhinitis, Allergic state, Anemia, Arthritis, Cancer (HCC), Chronic hepatitis C (HCC), Depression, Esophagitis, reflux, GERD (gastroesophageal reflux disease), Gout, Helicobacter pylori gastritis, Hepatitis, Hypertension, Neuromuscular disorder (HCC), Osteoarthrosis, Peripheral neuropathy, Rhabdomyolysis, Sickle cell anemia (HCC), Stroke  (HCC), Stroke (HCC), and Vitiligo.   Surgical History    Past Surgical History:  Procedure Laterality Date   COLON SURGERY     COLONOSCOPY WITH PROPOFOL  N/A 05/09/2015   Procedure: COLONOSCOPY WITH PROPOFOL ;  Surgeon: Deward CINDERELLA Piedmont, MD;  Location: [facility] ENDOSCOPY;  Service: Gastroenterology;  Laterality: N/A;   ESOPHAGOGASTRODUODENOSCOPY N/A 05/09/2015   Procedure: ESOPHAGOGASTRODUODENOSCOPY (EGD);  Surgeon: Deward CINDERELLA Piedmont, MD;  Location: [facility] ENDOSCOPY;  Service: Gastroenterology;  Laterality: N/A;   ESOPHAGOGASTRODUODENOSCOPY (EGD) WITH PROPOFOL  N/A 05/04/2017   Procedure: ESOPHAGOGASTRODUODENOSCOPY (EGD) WITH PROPOFOL ;  Surgeon: Toledo, Ladell POUR, MD;  Location: [facility] ENDOSCOPY;  Service: Gastroenterology;  Laterality: N/A;   FINGER DEBRIDEMENT  1976   s/p infected dog bite   TEE WITHOUT CARDIOVERSION N/A 05/20/2012   Procedure: TRANSESOPHAGEAL ECHOCARDIOGRAM (TEE);  Surgeon: Vina LULLA Gull, MD;  Location: [facility] ENDOSCOPY;  Service: Cardiovascular;  Laterality: N/A;   Social History   reports that he has been smoking  cigarettes. He has never used smokeless tobacco. He reports current alcohol use of about 9.0 standard drinks of alcohol per week. He reports current drug use. Drugs: Marijuana and Cocaine.   Family History   His family history is not on file.   Allergies Allergies  Allergen Reactions   Bee Venom Anaphylaxis   Bovine (Beef) Protein Other (See Comments)    Personal preference   Bovine (Beef) Protein-Containing Drug Products     Personal preference   Lactalbumin    Milk-Related Compounds Nausea And Vomiting    Intoloerance   Pegademase Bovine     Other reaction(s): Unknown Personal preference Personal preference Personal preference    Tilactase Nausea And Vomiting    Intoloerance   Milk (Cow) Nausea And Vomiting    Intoloerance    Home Medications  Prior to Admission medications   Medication Sig Start Date End Date Taking? Authorizing Provider  albuterol  (PROVENTIL   HFA;VENTOLIN  HFA) 108 (90 Base) MCG/ACT inhaler Inhale 2 puffs into the lungs daily as needed for wheezing or shortness of breath.   Yes [provider]  AMBULATORY NON FORMULARY MEDICATION Trimix (30/1/10)-(Pap/Phent/PGE)  Test Dose  1ml vial   Qty #3 Refills 0  [facility] [phone] Fax [phone] 01/14/23  Yes McGowan, Clotilda A, PA-C  HYDROcodone -acetaminophen  (NORCO/VICODIN) 5-325 MG tablet Take 1 tablet by mouth every 6 (six) hours as needed. 01/18/24  Yes [provider]  lisinopril-hydrochlorothiazide  (ZESTORETIC) 20-12.5 MG tablet Take 2 tablets by mouth daily. 01/14/24  Yes [provider]  traZODone (DESYREL) 50 MG tablet Take 50 mg by mouth at bedtime. 12/28/23 12/27/24 Yes [provider]  triamcinolone ointment (KENALOG) 0.1 % Apply topically.   Yes [provider]  acetaminophen  (TYLENOL ) 500 MG tablet Take by mouth. Patient not taking: Reported on 02/04/2024    [provider]  amLODipine (NORVASC) 5 MG tablet Take 5 mg by mouth daily.    [provider]  aspirin  EC 81 MG tablet Take 1 tablet (81 mg total) by mouth daily. Swallow whole. Patient not taking: Reported on 02/04/2024 09/21/22   Dorinda Drue DASEN, MD  escitalopram  (LEXAPRO ) 20 MG tablet Take 20 mg by mouth daily. Patient not taking: Reported on 02/04/2024    [provider]  folic acid  (FOLVITE ) 1 MG tablet Take 1 tablet (1 mg total) by mouth daily. Patient not taking: Reported on 02/04/2024 09/21/22   Dorinda Drue DASEN, MD  gabapentin  (NEURONTIN ) 100 MG capsule Take 100 mg by mouth 3 (three) times daily.    [provider]  oxyCODONE -acetaminophen  (PERCOCET/ROXICET) 5-325 MG tablet Take 1 tablet by mouth every 6 (six) hours as needed. Patient not taking: Reported on 02/04/2024 09/17/23   [provider]  pantoprazole  (PROTONIX ) 40 MG tablet Take 40 mg by mouth daily.    [provider]  predniSONE  (DELTASONE ) 50 MG  tablet Take 1 tablet (50 mg total) by mouth daily with breakfast. Patient taking differently: Take 10 mg by mouth daily with breakfast. 01/05/24   Arlander Charleston, MD  sildenafil  (VIAGRA ) 100 MG tablet Take 1 tablet (100 mg total) by mouth daily as needed for erectile dysfunction. Patient not taking: Reported on 02/04/2024 06/24/22   Penne Knee, MD  Scheduled Meds:  amLODipine  10 mg Oral Daily   amLODipine  5 mg Oral Once   Chlorhexidine Gluconate Cloth  6 each Topical Daily   folic acid   1 mg Oral Daily   fondaparinux (ARIXTRA) injection  2.5 mg Subcutaneous Q24H  gabapentin   100 mg Oral TID   lisinopril  40 mg Oral Daily   And   hydrochlorothiazide   25 mg Oral Daily   multivitamin with minerals  1 tablet Oral Daily   nicotine  21 mg Transdermal Daily   pantoprazole   40 mg Oral Daily   thiamine   100 mg Oral Daily   traZODone  50 mg Oral QHS   Continuous Infusions:  sodium chloride      0.9 % NaCl with KCl 20 mEq / L Stopped (02/06/24 1754)   cefTRIAXone (ROCEPHIN)  IV 2 g (02/06/24 2037)   propofol  (DIPRIVAN ) infusion 45 mcg/kg/min (02/06/24 2349)   PRN Meds:.acetaminophen  **OR** acetaminophen , labetalol , LORazepam , LORazepam  **OR** LORazepam , LORazepam , nitroGLYCERIN , ondansetron  **OR** ondansetron  (ZOFRAN ) IV, polyethylene glycol  Active Hospital Problem list   See systems below  Assessment & Plan:  #Acute Hypoxic Respiratory Failure iso #Acute Encephalopathy from ?over sedation #Aspiration Pneumonia PMHx: ?COPD, current everyday smoker -con't full mechanical support 6-8cc/kg/Vt  -titrate FiO2, PEEP to maintain O2 sat >90%  -Lung protective ventilation  -PRN Chest X-ray & ABG -PRN and scheduled bronchodilators -Start systemic steroid -SAT/SBT when appropriate  -prn fentanyl , prop for RASS -1    #Sepsis due to Suspected Aspiration Pneumonia -F/u cultures, trend lactic/ PCT -Monitor WBC/ fever curve -Obtain MRSA nasal swab, RVP, legionella, strep urine Ag -IV  antibiotics Ceftriaxone  -Gentle IVF hydration as needed -Pressors PRN for MAP goal >65 -Strict I/O's    #AKI  -Follow BMP+Mag -Ensure adequate renal perfusion -Avoid nephrotoxic  -Replace lytes -strict I&O  #Acute Metabolic Encephalopathy #EtOH Abuse with complicated withdrawal PMHx: Polysubstance abuse, Anxiety, Depression -CTH negative -Keep Magnesium  >2 & Potassium >4 -Treatment of metabolic derangements as outlined above -Provide supportive care -Promote normal sleep/wake cycle and family presence -Start Librium taper once able -Phenobarb taper -Propofol /Precedex for agitation AND sedation -Thiamine , Folate, and MVI   #Type II Diabetes Mellitus  #Peripheral Neuropathy -A1c 8.0 -CBG's q4hrs  -SSI -Target CBG readings 140 to 180 -Follow hypo/hyperglycemic protocol   #Syncope likely iso substance abuse #HTN #Chest Pain -Troponins flat with no dynamic changes on EKG -Hypertensive on admission now on sedation with controlled BP -Resume BP meds once able to tolerate p.o.  Best practice:  Diet:  NPO Pain/Anxiety/Delirium protocol (if indicated): Yes (RASS goal -1) VAP protocol (if indicated): Yes DVT prophylaxis: Contraindicated GI prophylaxis: PPI Glucose control:  SSI No Central venous access:  Yes, and it is still needed Arterial line:  N/A Foley:  Yes, and it is still needed Mobility:  bed rest  PT/OT consulted: N/A Code Status:  full code Disposition: ICU   = Goals of Care =  Primary Emergency ContactBETHA Caro Rase, Home Phone: [phone]   Critical care time: 55 minutes        Almarie Nose DNP, CCRN, FNP-C, AGACNP-BC Acute Care & Family Nurse Practitioner [facility] PCCM on call pager [phone]

## 2024-02-06 NOTE — Progress Notes (Addendum)
 Patient came to ICU from RR. Patient minimally responsive. Emergently intubated and central line placed. Patient tolerated well.    RN made aware of previous infiltration of IV in right arm. IV removed, dressing changed. Image in chart. Arm elevated. MD aware.

## 2024-02-06 NOTE — Procedures (Signed)
Central Venous Catheter Placement:TRIPLE LUMEN   Procedure: Insertion of Non-tunneled Central Venous Catheter(36556) with US guidance (79024)   Indication(s) Medication administration and Difficult access  CVP monitoring Patient receiving vesicant or irritant drug.; Patient receiving intravenous therapy for longer than 5 days.; Patient has limited or no vascular access.    Consent Risks of the procedure as well as the alternatives and risks of each were explained to the patient and/or caregiver.  Consent for the procedure was obtained and is signed in the bedside chart  Consent:emergent     Timeout Verified patient identification, verified procedure, site/side was marked, verified correct patient position, special equipment/implants available, medications/allergies/relevant history reviewed, required imaging and test results available. Patient comfort was obtained.     Sterile Technique Maximal sterile technique including full sterile barrier drape, hand hygiene, sterile gown, sterile gloves, mask, hair covering, sterile ultrasound probe cover (if used).   Hand washing performed prior to starting the procedure.    Procedure Description Area of catheter insertion was cleaned with chlorhexidine and draped in sterile fashion.  With real-time ultrasound guidance a central venous catheter was placed into the right internal jugular vein. Nonpulsatile blood flow and easy flushing noted in all ports.  The catheter was sutured in place and sterile dressing applied.   A triple lumen catheter was placed in RT Internal Jugular Vein There was good blood return, catheter caps were placed on lumens, catheter flushed easily, the line was secured and a sterile dressing and BIO-PATCH applied.    Complications/Tolerance None; patient tolerated the procedure well. Chest X-ray is ordered to verify placement     EBL Minimal   Specimen(s) None   Number of Attempts:  1 Complications:none Estimated Blood Loss: none Chest Radiograph indicated and ordered.  Operator: Jeweldean Drohan.   Lucie Leather, M.D.  [facility]  Medical Director [facility] Medical Director [facility] Cardio-Pulmonary Department

## 2024-02-06 NOTE — Progress Notes (Signed)
 PT Cancellation Note  Patient Details Name: Edward Weber MRN: [id number] DOB: 1961-01-30   Cancelled Treatment:    Reason Eval/Treat Not Completed: Patient's level of consciousness Pt sleeping in bed, opens eyes temporarily on attempts to wake him (calling name, shoulder rub, etc) but shows no awareness and quickly falling back asleep.  Pt has not had sedatives in a while, sitter present and to notify PT should pt become more aware and abe appropriate for activity with rehab.   [name], DPT 02/06/2024, 9:13 AM

## 2024-02-06 NOTE — Plan of Care (Signed)
  Problem: Clinical Measurements: Goal: Ability to maintain clinical measurements within normal limits will improve Outcome: Progressing Goal: Respiratory complications will improve Outcome: Progressing   Problem: Nutrition: Goal: Adequate nutrition will be maintained Outcome: Progressing   Problem: Coping: Goal: Level of anxiety will decrease Outcome: Progressing   Problem: Elimination: Goal: Will not experience complications related to urinary retention Outcome: Progressing   Problem: Safety: Goal: Ability to remain free from injury will improve Outcome: Progressing

## 2024-02-07 ENCOUNTER — Inpatient Hospital Stay

## 2024-02-07 DIAGNOSIS — F10931 Alcohol use, unspecified with withdrawal delirium: Secondary | ICD-10-CM | POA: Diagnosis not present

## 2024-02-07 LAB — GLUCOSE, CAPILLARY
Glucose-Capillary: 135 mg/dL — ABNORMAL HIGH (ref 70–99)
Glucose-Capillary: 130 mg/dL — ABNORMAL HIGH (ref 70–99)
Glucose-Capillary: 135 mg/dL — ABNORMAL HIGH (ref 70–99)
Glucose-Capillary: 118 mg/dL — ABNORMAL HIGH (ref 70–99)
Glucose-Capillary: 138 mg/dL — ABNORMAL HIGH (ref 70–99)

## 2024-02-07 LAB — RESPIRATORY PANEL BY PCR

## 2024-02-07 LAB — BASIC METABOLIC PANEL WITH GFR
Anion gap: 11 (ref 5–15)
BUN: 18 mg/dL (ref 8–23)
CO2: 22 mmol/L (ref 22–32)
Calcium: 8.1 mg/dL — ABNORMAL LOW (ref 8.9–10.3)
Chloride: 103 mmol/L (ref 98–111)
Creatinine, Ser: 1.6 mg/dL — ABNORMAL HIGH (ref 0.61–1.24)
GFR, Estimated: 48 mL/min — ABNORMAL LOW (ref >60)
Glucose, Bld: 144 mg/dL — ABNORMAL HIGH (ref 70–99)
Potassium: 4.3 mmol/L (ref 3.5–5.1)
Sodium: 136 mmol/L (ref 135–145)

## 2024-02-07 LAB — CBC
HCT: 35.6 % — ABNORMAL LOW (ref 39.0–52.0)
Hemoglobin: 11.5 g/dL — ABNORMAL LOW (ref 13.0–17.0)
MCH: 30.9 pg (ref 26.0–34.0)
MCHC: 32.3 g/dL (ref 30.0–36.0)
MCV: 95.7 fL (ref 80.0–100.0)
Platelets: 204 K/uL (ref 150–400)
RBC: 3.72 MIL/uL — ABNORMAL LOW (ref 4.22–5.81)
RDW: 15 % (ref 11.5–15.5)
WBC: 14 K/uL — ABNORMAL HIGH (ref 4.0–10.5)
nRBC: 0 % (ref 0.0–0.2)

## 2024-02-07 LAB — COMPREHENSIVE METABOLIC PANEL WITH GFR
ALT: 17 U/L (ref 0–44)
AST: 22 U/L (ref 15–41)
Albumin: 3.5 g/dL (ref 3.5–5.0)
Alkaline Phosphatase: 36 U/L — ABNORMAL LOW (ref 38–126)
Anion gap: 11 (ref 5–15)
BUN: 18 mg/dL (ref 8–23)
CO2: 22 mmol/L (ref 22–32)
Calcium: 8.5 mg/dL — ABNORMAL LOW (ref 8.9–10.3)
Chloride: 101 mmol/L (ref 98–111)
Creatinine, Ser: 1.65 mg/dL — ABNORMAL HIGH (ref 0.61–1.24)
GFR, Estimated: 46 mL/min — ABNORMAL LOW (ref >60)
Glucose, Bld: 147 mg/dL — ABNORMAL HIGH (ref 70–99)
Potassium: 4.8 mmol/L (ref 3.5–5.1)
Sodium: 134 mmol/L — ABNORMAL LOW (ref 135–145)
Total Bilirubin: 1 mg/dL (ref 0.0–1.2)
Total Protein: 7.4 g/dL (ref 6.5–8.1)

## 2024-02-07 LAB — MRSA NEXT GEN BY PCR, NASAL: MRSA by PCR Next Gen: NOT DETECTED

## 2024-02-07 LAB — LACTIC ACID, PLASMA: Lactic Acid, Venous: 1.7 mmol/L (ref 0.5–1.9)

## 2024-02-07 LAB — AMMONIA: Ammonia: 19 umol/L (ref 9–35)

## 2024-02-07 LAB — PROCALCITONIN: Procalcitonin: <0.1 ng/mL

## 2024-02-07 LAB — TRIGLYCERIDES: Triglycerides: 206 mg/dL — ABNORMAL HIGH (ref <150)

## 2024-02-07 MED ORDER — CHLORDIAZEPOXIDE HCL 25 MG PO CAPS
25.0000 mg | ORAL_CAPSULE | Freq: Every day | ORAL | Status: AC
  Administered 2024-02-10: 25 mg
  Filled 2024-02-07: qty 1

## 2024-02-07 MED ORDER — ORAL CARE MOUTH RINSE: 15.0000 mL | OROMUCOSAL | Status: DC | PRN

## 2024-02-07 MED ORDER — ORAL CARE MOUTH RINSE
15.0000 mL | OROMUCOSAL | Status: DC
  Administered 2024-02-07 – 2024-02-13 (×69): 15 mL via OROMUCOSAL

## 2024-02-07 MED ORDER — SODIUM CHLORIDE 0.9 % IV BOLUS
1000.0000 mL | Freq: Once | INTRAVENOUS | Status: AC
  Administered 2024-02-07: 1000 mL via INTRAVENOUS

## 2024-02-07 MED ORDER — CHLORDIAZEPOXIDE HCL 25 MG PO CAPS
25.0000 mg | ORAL_CAPSULE | Freq: Four times a day (QID) | ORAL | Status: AC
  Administered 2024-02-07: 25 mg
  Filled 2024-02-07: qty 1

## 2024-02-07 MED ORDER — HYDROXYZINE HCL 25 MG PO TABS: 25.0000 mg | ORAL_TABLET | Freq: Four times a day (QID) | ORAL | Status: DC | PRN

## 2024-02-07 MED ORDER — INSULIN ASPART 100 UNIT/ML IJ SOLN
0.0000 [IU] | INTRAMUSCULAR | Status: DC
  Administered 2024-02-07 – 2024-02-08 (×3): 2 [IU] via SUBCUTANEOUS
  Administered 2024-02-08 (×3): 3 [IU] via SUBCUTANEOUS
  Administered 2024-02-09: 2 [IU] via SUBCUTANEOUS
  Administered 2024-02-09 – 2024-02-11 (×12): 3 [IU] via SUBCUTANEOUS
  Administered 2024-02-11: 5 [IU] via SUBCUTANEOUS
  Administered 2024-02-11 (×2): 3 [IU] via SUBCUTANEOUS
  Administered 2024-02-11 (×2): 5 [IU] via SUBCUTANEOUS
  Administered 2024-02-11 – 2024-02-12 (×3): 3 [IU] via SUBCUTANEOUS
  Administered 2024-02-12: 5 [IU] via SUBCUTANEOUS
  Administered 2024-02-12: 8 [IU] via SUBCUTANEOUS
  Administered 2024-02-12: 5 [IU] via SUBCUTANEOUS
  Administered 2024-02-12 – 2024-02-13 (×2): 3 [IU] via SUBCUTANEOUS
  Administered 2024-02-13: 5 [IU] via SUBCUTANEOUS
  Administered 2024-02-13: 2 [IU] via SUBCUTANEOUS
  Administered 2024-02-13: 5 [IU] via SUBCUTANEOUS
  Administered 2024-02-14 – 2024-02-15 (×7): 2 [IU] via SUBCUTANEOUS
  Administered 2024-02-15: 5 [IU] via SUBCUTANEOUS
  Administered 2024-02-15 – 2024-02-16 (×3): 2 [IU] via SUBCUTANEOUS
  Administered 2024-02-16: 3 [IU] via SUBCUTANEOUS
  Administered 2024-02-16 – 2024-02-17 (×6): 2 [IU] via SUBCUTANEOUS
  Administered 2024-02-18: 3 [IU] via SUBCUTANEOUS
  Administered 2024-02-18 – 2024-02-20 (×10): 2 [IU] via SUBCUTANEOUS
  Administered 2024-02-20: 5 [IU] via SUBCUTANEOUS
  Administered 2024-02-20 – 2024-02-21 (×3): 2 [IU] via SUBCUTANEOUS
  Filled 2024-02-07 (×30): qty 1
  Filled 2024-02-07: qty 2
  Filled 2024-02-07 (×38): qty 1

## 2024-02-07 MED ORDER — FREE WATER
30.0000 mL | Status: DC
  Administered 2024-02-07 – 2024-02-13 (×34): 30 mL

## 2024-02-07 MED ORDER — CHLORDIAZEPOXIDE HCL 25 MG PO CAPS: 25.0000 mg | ORAL_CAPSULE | Freq: Four times a day (QID) | ORAL | Status: DC | PRN

## 2024-02-07 MED ORDER — CHLORDIAZEPOXIDE HCL 25 MG PO CAPS
25.0000 mg | ORAL_CAPSULE | Freq: Three times a day (TID) | ORAL | Status: AC
Start: 2024-02-08 — End: 2024-02-09
  Administered 2024-02-08 (×3): 25 mg
  Filled 2024-02-07 (×3): qty 1

## 2024-02-07 MED ORDER — THIAMINE HCL 100 MG/ML IJ SOLN
100.0000 mg | Freq: Once | INTRAMUSCULAR | Status: AC
Start: 2024-02-07 — End: 2024-02-07
  Administered 2024-02-07: 100 mg via INTRAMUSCULAR
  Filled 2024-02-07: qty 2

## 2024-02-07 MED ORDER — MIDAZOLAM HCL (PF) 2 MG/2ML IJ SOLN
1.0000 mg | INTRAMUSCULAR | Status: DC | PRN
  Administered 2024-02-08 – 2024-02-12 (×11): 2 mg via INTRAVENOUS
  Filled 2024-02-07 (×13): qty 2

## 2024-02-07 MED ORDER — CHLORDIAZEPOXIDE HCL 25 MG PO CAPS: 25.0000 mg | ORAL_CAPSULE | ORAL | Status: DC

## 2024-02-07 MED ORDER — SODIUM CHLORIDE 0.9 % IV SOLN: INTRAVENOUS | Status: DC

## 2024-02-07 MED ORDER — HYDROCORTISONE SOD SUC (PF) 100 MG IJ SOLR
100.0000 mg | Freq: Two times a day (BID) | INTRAMUSCULAR | Status: DC
  Administered 2024-02-07 (×2): 100 mg via INTRAVENOUS
  Filled 2024-02-07 (×2): qty 2

## 2024-02-07 MED ORDER — CHLORDIAZEPOXIDE HCL 25 MG PO CAPS: 25.0000 mg | ORAL_CAPSULE | Freq: Every day | ORAL | Status: DC

## 2024-02-07 MED ORDER — CHLORDIAZEPOXIDE HCL 25 MG PO CAPS: 25.0000 mg | ORAL_CAPSULE | Freq: Four times a day (QID) | ORAL | Status: AC | PRN

## 2024-02-07 MED ORDER — IPRATROPIUM-ALBUTEROL 0.5-2.5 (3) MG/3ML IN SOLN: 3.0000 mL | Freq: Four times a day (QID) | RESPIRATORY_TRACT | Status: DC | PRN

## 2024-02-07 MED ORDER — STERILE WATER FOR INJECTION IJ SOLN
INTRAMUSCULAR | Status: AC
  Administered 2024-02-07: 10 mL
  Filled 2024-02-07: qty 10

## 2024-02-07 MED ORDER — DEXMEDETOMIDINE HCL IN NACL 400 MCG/100ML IV SOLN
0.0000 ug/kg/h | INTRAVENOUS | Status: DC
  Administered 2024-02-07: 0.4 ug/kg/h via INTRAVENOUS
  Filled 2024-02-07: qty 100

## 2024-02-07 MED ORDER — THIAMINE HCL 100 MG/ML IJ SOLN
100.0000 mg | INTRAMUSCULAR | Status: DC
  Administered 2024-02-08 – 2024-02-10 (×3): 100 mg via INTRAVENOUS
  Filled 2024-02-07 (×3): qty 2

## 2024-02-07 MED ORDER — KATE FARMS STANDARD 1.4 EN LIQD
1000.0000 mL | ENTERAL | Status: DC
  Administered 2024-02-07 – 2024-02-12 (×6): 1000 mL via ORAL
  Filled 2024-02-07: qty 1000

## 2024-02-07 MED ORDER — CHLORDIAZEPOXIDE HCL 25 MG PO CAPS: 25.0000 mg | ORAL_CAPSULE | Freq: Three times a day (TID) | ORAL | Status: DC

## 2024-02-07 MED ORDER — LOPERAMIDE HCL 2 MG PO CAPS: 2.0000 mg | ORAL_CAPSULE | ORAL | Status: AC | PRN

## 2024-02-07 MED ORDER — PANTOPRAZOLE SODIUM 40 MG IV SOLR
40.0000 mg | Freq: Two times a day (BID) | INTRAVENOUS | Status: DC
  Administered 2024-02-07 – 2024-02-15 (×17): 40 mg via INTRAVENOUS
  Filled 2024-02-07 (×17): qty 10

## 2024-02-07 MED ORDER — CHLORDIAZEPOXIDE HCL 25 MG PO CAPS
25.0000 mg | ORAL_CAPSULE | ORAL | Status: AC
Start: 2024-02-09 — End: 2024-02-10
  Administered 2024-02-09 (×2): 25 mg
  Filled 2024-02-07 (×2): qty 1

## 2024-02-07 MED ORDER — CHLORDIAZEPOXIDE HCL 25 MG PO CAPS
25.0000 mg | ORAL_CAPSULE | Freq: Four times a day (QID) | ORAL | Status: DC
  Administered 2024-02-07 (×3): 25 mg via ORAL
  Filled 2024-02-07 (×3): qty 1

## 2024-02-07 MED ORDER — IPRATROPIUM-ALBUTEROL 0.5-2.5 (3) MG/3ML IN SOLN
3.0000 mL | Freq: Four times a day (QID) | RESPIRATORY_TRACT | Status: DC
  Administered 2024-02-07 – 2024-02-08 (×6): 3 mL via RESPIRATORY_TRACT
  Filled 2024-02-07 (×6): qty 3

## 2024-02-07 MED ORDER — METHYLPREDNISOLONE SODIUM SUCC 40 MG IJ SOLR: 20.0000 mg | Freq: Two times a day (BID) | INTRAMUSCULAR | Status: DC

## 2024-02-07 MED ORDER — FENTANYL CITRATE (PF) 50 MCG/ML IJ SOSY
25.0000 ug | PREFILLED_SYRINGE | INTRAMUSCULAR | Status: DC | PRN
  Administered 2024-02-08 – 2024-02-12 (×14): 50 ug via INTRAVENOUS
  Filled 2024-02-07 (×15): qty 1

## 2024-02-07 NOTE — Plan of Care (Signed)
 Attempting to wean off of ventilator today. Patient still remains on the ventilator. Patient still has a foley with little output. Patient still has a central line triple lumen in right internal jugular that is clear, dry, and intact.  Problem: Clinical Measurements: Goal: Diagnostic test results will improve 02/07/2024 1528 by Levora Edward NOVAK, RN Outcome: Progressing 02/07/2024 1526 by Levora Edward NOVAK, RN Outcome: Progressing   Problem: Education: Goal: Knowledge of General Education information will improve Description: Including pain rating scale, medication(s)/side effects and non-pharmacologic comfort measures 02/07/2024 1528 by Levora Edward NOVAK, RN Outcome: Not Progressing 02/07/2024 1526 by Levora Edward NOVAK, RN Outcome: Not Progressing   Problem: Health Behavior/Discharge Planning: Goal: Ability to manage health-related needs will improve 02/07/2024 1528 by Levora Edward NOVAK, RN Outcome: Not Progressing 02/07/2024 1526 by Levora Edward NOVAK, RN Outcome: Not Progressing   Problem: Clinical Measurements: Goal: Ability to maintain clinical measurements within normal limits will improve 02/07/2024 1528 by Levora Edward NOVAK, RN Outcome: Not Progressing 02/07/2024 1526 by Levora Edward NOVAK, RN Outcome: Not Progressing Goal: Will remain free from infection 02/07/2024 1528 by Levora Edward NOVAK, RN Outcome: Not Progressing 02/07/2024 1526 by Levora Edward NOVAK, RN Outcome: Not Progressing Goal: Respiratory complications will improve 02/07/2024 1528 by Levora Edward NOVAK, RN Outcome: Not Progressing 02/07/2024 1526 by Levora Edward NOVAK, RN Outcome: Not Progressing Goal: Cardiovascular complication will be avoided 02/07/2024 1528 by Levora Edward NOVAK, RN Outcome: Not Progressing 02/07/2024 1526 by Levora Edward NOVAK, RN Outcome: Not Progressing   Problem: Activity: Goal: Risk for activity intolerance will decrease 02/07/2024 1528 by Levora Edward NOVAK, RN Outcome: Not  Progressing 02/07/2024 1526 by Levora Edward NOVAK, RN Outcome: Not Progressing   Problem: Nutrition: Goal: Adequate nutrition will be maintained 02/07/2024 1528 by Levora Edward NOVAK, RN Outcome: Not Progressing 02/07/2024 1526 by Levora Edward NOVAK, RN Outcome: Not Progressing   Problem: Coping: Goal: Level of anxiety will decrease 02/07/2024 1528 by Levora Edward NOVAK, RN Outcome: Not Progressing 02/07/2024 1526 by Levora Edward NOVAK, RN Outcome: Not Progressing   Problem: Elimination: Goal: Will not experience complications related to bowel motility 02/07/2024 1528 by Levora Edward NOVAK, RN Outcome: Not Progressing 02/07/2024 1526 by Levora Edward NOVAK, RN Outcome: Not Progressing Goal: Will not experience complications related to urinary retention 02/07/2024 1528 by Levora Edward NOVAK, RN Outcome: Not Progressing 02/07/2024 1526 by Levora Edward NOVAK, RN Outcome: Not Progressing   Problem: Pain Managment: Goal: General experience of comfort will improve and/or be controlled 02/07/2024 1528 by Levora Edward NOVAK, RN Outcome: Not Progressing 02/07/2024 1526 by Levora Edward NOVAK, RN Outcome: Not Progressing   Problem: Safety: Goal: Ability to remain free from injury will improve 02/07/2024 1528 by Levora Edward NOVAK, RN Outcome: Not Progressing 02/07/2024 1526 by Levora Edward NOVAK, RN Outcome: Not Progressing   Problem: Skin Integrity: Goal: Risk for impaired skin integrity will decrease 02/07/2024 1528 by Levora Edward NOVAK, RN Outcome: Not Progressing 02/07/2024 1526 by Levora Edward NOVAK, RN Outcome: Not Progressing

## 2024-02-07 NOTE — Progress Notes (Signed)
 Per review of chart, patient is currently intubated and sedated- unable to conduct psychiatric evaluation at this time.

## 2024-02-07 NOTE — Care Management Important Message (Signed)
 Important Message  Patient Details  Name: Edward Weber MRN: [id number] Date of Birth: 08/09/1960   Important Message Given:  Yes - Medicare IM     Rojelio SHAUNNA Rattler 02/07/2024, 4:36 PM

## 2024-02-07 NOTE — Progress Notes (Signed)
 NAME:  Edward Weber, MRN:  [id number], DOB:  May 10, 1960, LOS: 3 ADMISSION DATE:  02/04/2024, CONSULTATION DATE:  02/06/24 REFERRING MD: Kandis Asa REASON FOR CONSULT:  Acute Respiratory Distress   HPI  63 y.o male with significant PMH of HTN, CVA, sickle cell anemia, GERD, polysubstance abuse (EtOH abuse disorder, cocaine, marijuana tobacco abuse), and recurrent syncope who presented to the ED with chief complaints of syncopal episode. Per chart review, he has been having exertional chest pain, DOP for the past few days and today felt dizzy and passed out without hitting his head.  ED Course: Initial vital signs showed HR of 84beats/minute, BP203/102 mm Hg, the RR 20 breaths/minute, 98%O2 Sat on and Temp of 98.21F (36.7C).   Pertinent Labs/Diagnostics Findings: Na+/ K+:141/4.0 Glucose:166  WBC:7.3 K/L Hgb/Hct:11.5/34.9  COVID PCR: Negative,  UDS+ Marijuana CXR> CTH> CTA Chest> CT Abd/pelvis>negative for acute abnormality  Admitted to TRH service for further management of syncopal episode. SEE SIGNIFICANT HOSPITAL EVENTS BELOW.   Past Medical History    Alcoholism (HCC)     Allergic rhinitis     Allergic state     Anemia     Arthritis     Cancer (HCC)     Chronic hepatitis C (HCC)     Depression     Esophagitis, reflux     GERD (gastroesophageal reflux disease)     Gout     Helicobacter pylori gastritis     Hepatitis     Hypertension     Neuromuscular disorder (HCC)     Osteoarthrosis     Peripheral neuropathy     Rhabdomyolysis     Sickle cell anemia (HCC)     Stroke [facility])     Stroke [facility])     Vitiligo    Significant Hospital Events   10/24: Admitted to TRH service for syncopal workup.  Psych consulted for agitation and threatening to leave AMA. Due to history of polysubstance abuse and AMS patient was IVCd 10/25: Remains IVC due to agitation and delirium.  Phenobarb taper added per primary team. 10/26: Transferred to the ICU for worsening somnolent and  increased work of breathing requiring emergent intubation. 10/27: Remains critically ill requiring mechanical ventilation. Agitation requiring propofol . Remains on ceftriaxone for aspiration pna coverage.  Consults:  PCCM Psychiatric  Procedures:  10/26: Endotracheal intubation 10/26: Central Line placement  Interim History / Subjective:    -Remains intubated and sedated  Micro Data:  10/24: SARS-CoV-2 PCR> negative 10/24: Influenza PCR> negative 10:27 Respiratory Viral Panel> 10/27: Blood culture x2> 10/27: MRSA PCR>> negative 10/27: Strep pneumo Ur Ag 10/27: Legionella Ur Ag> 10/27: Respiratory Culture w/ Gram Stain >>  Antimicrobials:  Vancomycin Cefepime Azithromycin Ceftriaxone Metronidazole  OBJECTIVE  Blood pressure (!) 156/75, pulse 100, temperature (!) 101 F (38.3 C), temperature source Oral, resp. rate 20, height 5' 8 (1.727 m), weight 79 kg, SpO2 96%.    Vent Mode: PRVC FiO2 (%):  [35 %-40 %] 35 % Set Rate:  [20 bmp] 20 bmp Vt Set:  [450 mL] 450 mL PEEP:  [5 cmH20] 5 cmH20 Plateau Pressure:  [13 cmH20-21 cmH20] 13 cmH20   Intake/Output Summary (Last 24 hours) at 02/07/2024 0820 Last data filed at 02/07/2024 0644 Gross per 24 hour  Intake 2428.72 ml  Output 460 ml  Net 1968.72 ml   Filed Weights   02/06/24 0406 02/06/24 1825 02/07/24 0500  Weight: 81.7 kg 79 kg 79 kg    Physical Examination  GEN: Critically ill  appearing male, intubated and sedated in NAD HENT: normocephalic, atraumatic, anicteric sclera, bloody oral secretions HEART: RRR, S1, S2, no M/R/G,  LUNGS: no increased WOB, clear throughout with mild crackles ABDOMINAL: soft, non-tender, non-distended, no rebound/guarding, bowel sounds x 4 EXTREMITIES: trace pretibial edema, radial pulses 2+, distal 1+ NEURO: No gross focal deficits, intubated and sedated SKIN: Intact, warm, scattered blisters of right arm  Labs/imaging that I havepersonally reviewed  (right click and Reselect  all SmartList Selections daily)   DG Abd 1 View Result Date: 02/07/2024 EXAM: 1 VIEW XRAY OF THE ABDOMEN 02/07/2024 03:09:34 AM COMPARISON: None available. CLINICAL HISTORY: FINDINGS: LINES, TUBES AND DEVICES: Enteric tube terminates in the gastric antrum, in satisfactory position. BOWEL: Nonobstructive bowel gas pattern. SOFT TISSUES: 7 mm calculus projecting over the left kidney. BONES: No acute osseous abnormality. IMPRESSION: 1. Enteric tube in satisfactory position. Electronically signed by: Pinkie Pebbles MD 02/07/2024 03:13 AM EDT RP Workstation: [id number]   DG Chest 1 View Result Date: 02/07/2024 EXAM: 1 AP VIEW XRAY OF THE CHEST 02/07/2024 03:08:30 AM COMPARISON: 02/06/2024 CLINICAL HISTORY: ASPIRATION INTO AIRWAY FINDINGS: LINES, TUBES AND DEVICES: Right internal jugular central venous catheter in place with tip overlying the right atrium. Endotracheal tube in place with tip 1.7 cm above the carina. Enteric tube in place coursing into the mid stomach. LUNGS AND PLEURA: No focal pulmonary opacity. No pulmonary edema. No pleural effusion. No pneumothorax. HEART AND MEDIASTINUM: No acute abnormality of the cardiac and mediastinal silhouettes. BONES AND SOFT TISSUES: No acute osseous abnormality. IMPRESSION: 1. Endotracheal tube with tip 1.7 cm above the carina. 2. Right internal jugular central venous catheter with tip overlying the right atrium. 3. Enteric tube courses into the stomach. Electronically signed by: Pinkie Pebbles MD 02/07/2024 03:12 AM EDT RP Workstation: [id number]   DG Chest Port 1 View Result Date: 02/06/2024 EXAM: 1 VIEW(S) XRAY OF THE CHEST 02/06/2024 07:30:00 PM COMPARISON: 02/06/2024 CLINICAL HISTORY: Central line placement FINDINGS: LINES, TUBES AND DEVICES: Right internal jugular central venous catheter in place with tip in the right atrium. Endotracheal tube in place with tip 2 cm above the carina. LUNGS AND PLEURA: No focal pulmonary opacity. No pulmonary edema. No  pleural effusion. No pneumothorax. HEART AND MEDIASTINUM: No acute abnormality of the cardiac and mediastinal silhouettes. BONES AND SOFT TISSUES: No acute osseous abnormality. IMPRESSION: 1. Right internal jugular central venous catheter with tip in the right atrium, appropriate position. 2. Endotracheal tube with tip 2 cm above the carina, appropriate position. Electronically signed by: Pinkie Pebbles MD 02/06/2024 07:31 PM EDT RP Workstation: [id number]   DG Chest Port 1 View Result Date: 02/06/2024 CLINICAL DATA:  Dyspnea. EXAM: PORTABLE CHEST 1 VIEW COMPARISON:  Radiograph and CT 02/04/2024 FINDINGS: Lung volumes are low.The cardiomediastinal contours are stable allowing for differences in technique. Pulmonary vasculature is normal. No consolidation, pleural effusion, or pneumothorax. No acute osseous abnormalities are seen. IMPRESSION: Low lung volumes without acute abnormality. Electronically Signed   By: Andrea Gasman M.D.   On: 02/06/2024 18:28   ECHOCARDIOGRAM COMPLETE Result Date: 02/05/2024    ECHOCARDIOGRAM REPORT   Patient Name:   DEVONTAY CELAYA Date of Exam: 02/05/2024 Medical Rec #:  [id number]        Height:       68.0 in Accession #:    [id number]       Weight:       180.8 lb Date of Birth:  [birth year]        BSA:  1.958 m Patient Age:    63 years         BP:           158/117 mmHg Patient Gender: M                HR:           101 bpm. Exam Location:  [facility] Procedure: 2D Echo, Cardiac Doppler and Color Doppler (Both Spectral and Color            Flow Doppler were utilized during procedure). Indications:     Syncope R55  History:         Patient has prior history of Echocardiogram examinations.                  Stroke.  Sonographer:     Bari Roar Referring Phys:  [id number] POKHREL Diagnosing Phys: Evalene Lunger MD IMPRESSIONS  1. Left ventricular ejection fraction, by estimation, is 60 to 65%. The left ventricle has normal function. The left ventricle has no regional  wall motion abnormalities. There is mild left ventricular hypertrophy. Left ventricular diastolic parameters are consistent with Grade I diastolic dysfunction (impaired relaxation).  2. Right ventricular systolic function is normal. The right ventricular size is normal. Tricuspid regurgitation signal is inadequate for assessing PA pressure.  3. The mitral valve is normal in structure. Mild mitral valve regurgitation. No evidence of mitral stenosis.  4. The aortic valve is normal in structure. Aortic valve regurgitation is mild. No aortic stenosis is present.  5. There is borderline dilatation of the ascending aorta, measuring 39 mm.  6. The inferior vena cava is normal in size with greater than 50% respiratory variability, suggesting right atrial pressure of 3 mmHg. FINDINGS  Left Ventricle: Left ventricular ejection fraction, by estimation, is 60 to 65%. The left ventricle has normal function. The left ventricle has no regional wall motion abnormalities. Strain was performed and the global longitudinal strain is indeterminate. The left ventricular internal cavity size was normal in size. There is mild left ventricular hypertrophy. Left ventricular diastolic parameters are consistent with Grade I diastolic dysfunction (impaired relaxation). Right Ventricle: The right ventricular size is normal. No increase in right ventricular wall thickness. Right ventricular systolic function is normal. Tricuspid regurgitation signal is inadequate for assessing PA pressure. Left Atrium: Left atrial size was normal in size. Right Atrium: Right atrial size was normal in size. Pericardium: There is no evidence of pericardial effusion. Mitral Valve: The mitral valve is normal in structure. Mild mitral valve regurgitation. No evidence of mitral valve stenosis. MV peak gradient, 5.5 mmHg. The mean mitral valve gradient is 2.0 mmHg. Tricuspid Valve: The tricuspid valve is normal in structure. Tricuspid valve regurgitation is mild . No  evidence of tricuspid stenosis. Aortic Valve: The aortic valve is normal in structure. Aortic valve regurgitation is mild. Aortic regurgitation PHT measures 534 msec. No aortic stenosis is present. Aortic valve mean gradient measures 3.0 mmHg. Aortic valve peak gradient measures 5.7 mmHg. Aortic valve area, by VTI measures 5.91 cm. Pulmonic Valve: The pulmonic valve was normal in structure. Pulmonic valve regurgitation is not visualized. No evidence of pulmonic stenosis. Aorta: The aortic root is normal in size and structure. There is borderline dilatation of the ascending aorta, measuring 39 mm. Venous: The inferior vena cava is normal in size with greater than 50% respiratory variability, suggesting right atrial pressure of 3 mmHg. IAS/Shunts: No atrial level shunt detected by color flow Doppler. Additional Comments: 3D was  performed not requiring image post processing on an independent workstation and was indeterminate.  LEFT VENTRICLE PLAX 2D LVIDd:         4.50 cm   Diastology LVIDs:         3.20 cm   LV e' medial:    5.66 cm/s LV PW:         1.30 cm   LV E/e' medial:  9.7 LV IVS:        1.40 cm   LV e' lateral:   7.72 cm/s LVOT diam:     2.50 cm   LV E/e' lateral: 7.1 LV SV:         96 LV SV Index:   49 LVOT Area:     4.91 cm  RIGHT VENTRICLE RV Basal diam:  2.40 cm RV Mid diam:    2.20 cm RV S prime:     30.20 cm/s TAPSE (M-mode): 3.4 cm LEFT ATRIUM             Index        RIGHT ATRIUM           Index LA diam:        3.50 cm 1.79 cm/m   RA Area:     13.10 cm LA Vol (A2C):   56.7 ml 28.96 ml/m  RA Volume:   29.30 ml  14.97 ml/m LA Vol (A4C):   40.1 ml 20.48 ml/m LA Biplane Vol: 47.6 ml 24.31 ml/m  AORTIC VALVE                    PULMONIC VALVE AV Area (Vmax):    4.58 cm     PV Vmax:          1.01 m/s AV Area (Vmean):   4.59 cm     PV Peak grad:     4.1 mmHg AV Area (VTI):     5.91 cm     PR End Diast Vel: 5.66 msec AV Vmax:           119.00 cm/s  RVOT Peak grad:   2 mmHg AV Vmean:          76.000  cm/s AV VTI:            0.162 m AV Peak Grad:      5.7 mmHg AV Mean Grad:      3.0 mmHg LVOT Vmax:         111.00 cm/s LVOT Vmean:        71.000 cm/s LVOT VTI:          0.195 m LVOT/AV VTI ratio: 1.20 AI PHT:            534 msec  AORTA Ao Root diam: 3.30 cm Ao Asc diam:  3.90 cm MITRAL VALVE MV Area (PHT): 6.54 cm    SHUNTS MV Area VTI:   6.89 cm    Systemic VTI:  0.20 m MV Peak grad:  5.5 mmHg    Systemic Diam: 2.50 cm MV Mean grad:  2.0 mmHg MV Vmax:       1.17 m/s MV Vmean:      70.7 cm/s MV Decel Time: 116 msec MV E velocity: 55.00 cm/s MV A velocity: 92.20 cm/s MV E/A ratio:  0.60 MV A Prime:    14.7 cm/s Evalene Lunger MD Electronically signed by Evalene Lunger MD Signature Date/Time: 02/05/2024/4:47:39 PM    Final     Labs   CBC: Recent Labs  Lab 02/04/24 0242 02/05/24 0400 02/07/24 0449  WBC 7.3 8.8 14.0*  NEUTROABS 5.5  --   --   HGB 11.5* 11.6* 11.5*  HCT 34.9* 34.1* 35.6*  MCV 94.3 92.9 95.7  PLT 166 169 204    Basic Metabolic Panel: Recent Labs  Lab 02/04/24 0242 02/05/24 0400 02/06/24 0353 02/07/24 0202 02/07/24 0449  NA 141 139 136 134* 136  K 4.0 3.6 3.4* 4.8 4.3  CL 106 104 103 101 103  CO2 24 23 24 22 22   GLUCOSE 166* 130* 123* 147* 144*  BUN 12 7* 8 18 18   CREATININE 1.01 1.02 1.08 1.65* 1.60*  CALCIUM  8.5* 8.9 8.8* 8.5* 8.1*  MG  --  1.8  --   --   --   PHOS  --  3.1  --   --   --    GFR: Estimated Creatinine Clearance: 44.5 mL/min (A) (by C-G formula based on SCr of 1.6 mg/dL (H)). Recent Labs  Lab 02/04/24 0242 02/05/24 0400 02/07/24 0202 02/07/24 0449  PROCALCITON  --   --   --  <0.10  WBC 7.3 8.8  --  14.0*  LATICACIDVEN  --   --  1.7  --     Liver Function Tests: Recent Labs  Lab 02/04/24 0242 02/07/24 0202  AST 28 22  ALT 22 17  ALKPHOS 44 36*  BILITOT 0.7 1.0  PROT 6.9 7.4  ALBUMIN 4.0 3.5   No results for input(s): LIPASE, AMYLASE in the last 168 hours. No results for input(s): AMMONIA in the last 168 hours.  ABG     Component Value Date/Time   PHART 7.46 (H) 02/06/2024 2005   PCO2ART 32 02/06/2024 2005   PO2ART 89 02/06/2024 2005   HCO3 22.8 02/06/2024 2005   ACIDBASEDEF 0.3 02/06/2024 2005   O2SAT 98.6 02/06/2024 2005     Coagulation Profile: No results for input(s): INR, PROTIME in the last 168 hours.  Cardiac Enzymes: No results for input(s): CKTOTAL, CKMB, CKMBINDEX, TROPONINI in the last 168 hours.  HbA1C: Hgb A1c MFr Bld  Date/Time Value Ref Range Status  02/04/2024 05:27 PM 8.1 (H) 4.8 - 5.6 % Final    Comment:    (NOTE) Diagnosis of Diabetes The following HbA1c ranges recommended by the American Diabetes Association (ADA) may be used as an aid in the diagnosis of diabetes mellitus.  Hemoglobin             Suggested A1C NGSP%              Diagnosis  <5.7                   Non Diabetic  5.7-6.4                Pre-Diabetic  >6.4                   Diabetic  <7.0                   Glycemic control for                       adults with diabetes.    05/18/2012 10:08 AM 5.7 (H) <5.7 % Final    Comment:    (NOTE)  According to the ADA Clinical Practice Recommendations for 2011, when HbA1c is used as a screening test:  >=6.5%   Diagnostic of Diabetes Mellitus           (if abnormal result is confirmed) 5.7-6.4%   Increased risk of developing Diabetes Mellitus References:Diagnosis and Classification of Diabetes Mellitus,Diabetes Care,2011,34(Suppl 1):S62-S69 and Standards of Medical Care in         Diabetes - 2011,Diabetes Care,2011,34 (Suppl 1):S11-S61.   CBG: Recent Labs  Lab 02/06/24 1814 02/06/24 1827 02/07/24 0731  GLUCAP 145* 149* 135*   Review of Systems:   Unable to be obtained secondary to the patient's intubated and sedated status.   Past Medical History  He,  has a past medical history of Alcoholism (HCC), Allergic rhinitis, Allergic state, Anemia, Arthritis, Cancer (HCC),  Chronic hepatitis C (HCC), Depression, Esophagitis, reflux, GERD (gastroesophageal reflux disease), Gout, Helicobacter pylori gastritis, Hepatitis, Hypertension, Neuromuscular disorder (HCC), Osteoarthrosis, Peripheral neuropathy, Rhabdomyolysis, Sickle cell anemia (HCC), Stroke (HCC), Stroke (HCC), and Vitiligo.   Surgical History    Past Surgical History:  Procedure Laterality Date   COLON SURGERY     COLONOSCOPY WITH PROPOFOL  N/A 05/09/2015   Procedure: COLONOSCOPY WITH PROPOFOL ;  Surgeon: Deward CINDERELLA Piedmont, MD;  Location: [facility] ENDOSCOPY;  Service: Gastroenterology;  Laterality: N/A;   ESOPHAGOGASTRODUODENOSCOPY N/A 05/09/2015   Procedure: ESOPHAGOGASTRODUODENOSCOPY (EGD);  Surgeon: Deward CINDERELLA Piedmont, MD;  Location: [facility] ENDOSCOPY;  Service: Gastroenterology;  Laterality: N/A;   ESOPHAGOGASTRODUODENOSCOPY (EGD) WITH PROPOFOL  N/A 05/04/2017   Procedure: ESOPHAGOGASTRODUODENOSCOPY (EGD) WITH PROPOFOL ;  Surgeon: Toledo, Ladell POUR, MD;  Location: [facility] ENDOSCOPY;  Service: Gastroenterology;  Laterality: N/A;   FINGER DEBRIDEMENT  1976   s/p infected dog bite   TEE WITHOUT CARDIOVERSION N/A 05/20/2012   Procedure: TRANSESOPHAGEAL ECHOCARDIOGRAM (TEE);  Surgeon: Vina LULLA Gull, MD;  Location: [facility] ENDOSCOPY;  Service: Cardiovascular;  Laterality: N/A;   Social History   reports that he has been smoking cigarettes. He has never used smokeless tobacco. He reports current alcohol use of about 9.0 standard drinks of alcohol per week. He reports current drug use. Drugs: Marijuana and Cocaine.   Family History   His family history is not on file.   Allergies Allergies  Allergen Reactions   Bee Venom Anaphylaxis   Bovine (Beef) Protein Other (See Comments)    Personal preference   Bovine (Beef) Protein-Containing Drug Products     Personal preference   Lactalbumin    Milk-Related Compounds Nausea And Vomiting    Intoloerance   Pegademase Bovine     Other reaction(s): Unknown Personal preference Personal  preference Personal preference    Tilactase Nausea And Vomiting    Intoloerance   Milk (Cow) Nausea And Vomiting    Intoloerance    Home Medications  Prior to Admission medications   Medication Sig Start Date End Date Taking? Authorizing Provider  albuterol  (PROVENTIL  HFA;VENTOLIN  HFA) 108 (90 Base) MCG/ACT inhaler Inhale 2 puffs into the lungs daily as needed for wheezing or shortness of breath.   Yes [provider]  AMBULATORY NON FORMULARY MEDICATION Trimix (30/1/10)-(Pap/Phent/PGE)  Test Dose  1ml vial   Qty #3 Refills 0  [facility] [phone] Fax [phone] 01/14/23  Yes McGowan, Clotilda A, PA-C  HYDROcodone -acetaminophen  (NORCO/VICODIN) 5-325 MG tablet Take 1 tablet by mouth every 6 (six) hours as needed. 01/18/24  Yes [provider]  lisinopril-hydrochlorothiazide  (ZESTORETIC) 20-12.5 MG tablet Take 2 tablets by mouth daily. 01/14/24  Yes [provider]  traZODone (DESYREL) 50 MG tablet  Take 50 mg by mouth at bedtime. 12/28/23 12/27/24 Yes [provider]  triamcinolone ointment (KENALOG) 0.1 % Apply topically.   Yes [provider]  acetaminophen  (TYLENOL ) 500 MG tablet Take by mouth. Patient not taking: Reported on 02/04/2024    [provider]  amLODipine (NORVASC) 5 MG tablet Take 5 mg by mouth daily.    [provider]  aspirin  EC 81 MG tablet Take 1 tablet (81 mg total) by mouth daily. Swallow whole. Patient not taking: Reported on 02/04/2024 09/21/22   Dorinda Drue DASEN, MD  escitalopram  (LEXAPRO ) 20 MG tablet Take 20 mg by mouth daily. Patient not taking: Reported on 02/04/2024    [provider]  folic acid  (FOLVITE ) 1 MG tablet Take 1 tablet (1 mg total) by mouth daily. Patient not taking: Reported on 02/04/2024 09/21/22   Dorinda Drue DASEN, MD  gabapentin  (NEURONTIN ) 100 MG capsule Take 100 mg by mouth 3 (three) times daily.    [provider]  oxyCODONE -acetaminophen   (PERCOCET/ROXICET) 5-325 MG tablet Take 1 tablet by mouth every 6 (six) hours as needed. Patient not taking: Reported on 02/04/2024 09/17/23   [provider]  pantoprazole  (PROTONIX ) 40 MG tablet Take 40 mg by mouth daily.    [provider]  predniSONE  (DELTASONE ) 50 MG tablet Take 1 tablet (50 mg total) by mouth daily with breakfast. Patient taking differently: Take 10 mg by mouth daily with breakfast. 01/05/24   Arlander Charleston, MD  sildenafil  (VIAGRA ) 100 MG tablet Take 1 tablet (100 mg total) by mouth daily as needed for erectile dysfunction. Patient not taking: Reported on 02/04/2024 06/24/22   Penne Knee, MD  Scheduled Meds:  chlordiazePOXIDE  25 mg Oral QID   Followed by   NOREEN ON 02/08/2024] chlordiazePOXIDE  25 mg Oral TID   Followed by   NOREEN ON 02/09/2024] chlordiazePOXIDE  25 mg Oral BH-qamhs   Followed by   NOREEN ON 02/10/2024] chlordiazePOXIDE  25 mg Oral Daily   Chlorhexidine Gluconate Cloth  6 each Topical Daily   folic acid   1 mg Oral Daily   fondaparinux (ARIXTRA) injection  2.5 mg Subcutaneous Q24H   ipratropium-albuterol   3 mL Nebulization Q6H   methylPREDNISolone  (SOLU-MEDROL ) injection  20 mg Intravenous Q12H   multivitamin with minerals  1 tablet Oral Daily   nicotine  21 mg Transdermal Daily   pantoprazole  (PROTONIX ) IV  40 mg Intravenous Q12H   Continuous Infusions:  sodium chloride  125 mL/hr at 02/07/24 0644   0.9 % NaCl with KCl 20 mEq / L Stopped (02/06/24 1754)   cefTRIAXone (ROCEPHIN)  IV Stopped (02/06/24 2107)   propofol  (DIPRIVAN ) infusion 45 mcg/kg/min (02/07/24 0644)   PRN Meds:.acetaminophen  **OR** acetaminophen , chlordiazePOXIDE, fentaNYL  (SUBLIMAZE ) injection, hydrOXYzine , ipratropium-albuterol , labetalol , loperamide, nitroGLYCERIN , ondansetron  **OR** ondansetron  (ZOFRAN ) IV, polyethylene glycol  Active Hospital Problem list   See systems below  Assessment & Plan:   #Acute Metabolic Encephalopathy #EtOH Abuse with  complicated withdrawal #Polysubstance Abuse Hx: Polysubstance abuse, Anxiety, Depression 10/24: CT Head negative 10/24: UDS + cannabinoid, opiates - PAD protocol - Fentanyl  prn and Propofol  to maintain RASS goal 0 to -1 - Wean sedation as able - Add precedex - Maintain Mag >2 and Potassium >4 - Provide supportive care - Promote normal sleep/wake cycle - CIWA protocol - Continue thiamine , folate and MVI - Management of metabolic derangements - Librium taper - Psych following; appreciate input - Ammonia wnl   #Syncope likely secondary to substance abuse #Hypertension #Chest Pain Hx: Hypertension Home Meds:  Lisinopril hydrochlorothiazide , amlodipine 10/24 Chest CTA: negative for PE 10/25 ECHO: EF 60-65%, mild left ventricular hypertrophy; Grade I diastolic dysfunction - Continuous cardiac monitoring - Vasopressors to maintain MAP goal >65 ~ not currently requiring - Initially hypertensive on admission; normotensive while sedated - Labetalol  prn  - Nitroglycerin  prn - Troponins WNL on admission - Intermittent EKG; no previous changes - Restart antihypertensives once able to tolerate PO   #Acute Hypoxic Respiratory Failure secondary to Aspiration Pneumonia Hx: ?COPD, current everyday smoker - Continue full mechanical support; on minimal settings - Wean FiO2 and PEEP as able; maintain O2 >90% - VAP Bundle - Pulmonary Hygiene - Lung protective ventilation - Wake up Assessment and SBT when parameters met - Bloody secretions present; will give him a day or so before considering extubation - Intermittent CXR and ABG - Add hydrocortisone 100mg     #Sepsis secondary to Suspected Aspiration Pneumonia - Trend WBC and monitor fever curve - WBC: 14.0 with Tmax 101 in previous 24 hours - Cultures pending - Trend lactic: most recent 1.7 - Continue Ceftriaxone  - IVF as needed     #Acute Kidney Injury  - Strict I/O's - Trend BMP and renal function - Monitor Mag and maintain  >2 - Ensure adequate renal perfusion - Avoid nephrotoxic medications as able - Electrolyte replacement protocol as indicated - Pharmacy to assist with replacement   #Alcoholic Liver Disease #Chronic Hepatitis #GERD without Esophagitis #GI Prophylaxis - Monitor hepatic function - LFTs wnl - Continue PPI: Protonix  40mg  Q12H   #Normocytic Anemia - Trend CBC - Monitor H/H, platelets and coags - Transfuse if Hgb < 7; Hgb at his baseline (10.8-11.5) - Monitor for s/sx of bleeding - DVT prophylaxis: Fondaparinux 2.5 mg daily   #Type II Diabetes Mellitus  #Peripheral Neuropathy Home Medications: Gabapentin  - ICU hypo/hyperglycemia protocol - CBG Q4H - SSI as indicated - Glucose goal: 140-180     Best practice:  Diet:  NPO Pain/Anxiety/Delirium protocol (if indicated): Yes (RASS goal -1) VAP protocol (if indicated): Yes DVT prophylaxis: Fondaparinux GI prophylaxis: PPI Glucose control:  SSI No Central venous access:  Yes, and it is still needed Arterial line:  N/A Foley:  Yes, and it is still needed Mobility:  bed rest  PT/OT consulted: N/A Code Status:  full code Disposition: ICU   = Goals of Care =  Primary Emergency ContactBETHA Caro Rase, Home Phone: [phone]   Critical care time: 55 minutes      Robet Kim, PA-C [facility] PCCM Team Contact Info: [phone]

## 2024-02-07 NOTE — Plan of Care (Signed)

## 2024-02-07 NOTE — Progress Notes (Signed)
 PT Cancellation Note  Patient Details Name: Edward Weber MRN: [id number] DOB: 1960-09-07   Cancelled Treatment:    Reason Eval/Treat Not Completed: Medical issues which prohibited therapy (Patient required transfer to ICU, is intubated and sedated. Please re-consult PT when appropriate.)  Randine Essex, PT, MPT  Randine LULLA Essex 02/07/2024, 8:15 AM

## 2024-02-07 NOTE — Progress Notes (Addendum)
 Initial Nutrition Assessment  DOCUMENTATION CODES:   Not applicable  INTERVENTION:   Edward Weber RTH @60ml /hr- Initiate at 58ml/hr and increase by 10ml/hr q 8 hours until goal rate is reached.   Free water flushes 30ml q4 hours to maintain tube patency   Regimen provides 2016kcal/day, 89g/day protein and 1202ml/day of free water.   Thiamine  100mg  daily via tube   Pt at high refeed risk; recommend monitor potassium, magnesium  and phosphorus labs daily until stable  Daily weights   Check B12 level   NUTRITION DIAGNOSIS:   Inadequate oral intake related to inability to eat (pt sedated and ventilated) as evidenced by NPO status.  GOAL:   Provide needs based on ASPEN/SCCM guidelines  MONITOR:   Vent status, Labs, Weight trends, TF tolerance, I & O's, Skin  REASON FOR ASSESSMENT:   Ventilator    ASSESSMENT:   63 y/o male with h/o etoh and substance abuse, HTN, GERD, Schatzki's ring, hiatal hernia, CVA with residual hemiplegia, DM, neuropathy, prostate cancer, HCV, sickle cell anemia, ICH, depression, CKD III, gout, H. pylori and spinal stenosis who is admitted with syncope, aspiration PNA, sepsis and AKI.  Pt sedated and ventilated. Pt's girlfriend is at bedside and reports pt with good appetite and oral intake at baseline. Pt with a noted milk allergy. Girlfriend reports that patient always checks the label and avoids all food with dairy. Girlfriend reports that dairy gives pt nausea and vomiting. Pt does not eat milk, cheese or yogurt. OGT in place. Pt had some bloody output from his mouth after OGT placement but no blood noted from output when hooked to suction. Will plan to initiate tube feeds today. Pt is likely at refeed risk. Per chart, pt is down 6lbs since admission and is down ~16lbs from his UBW.   Of note, pt with B12 deficiency; will check a level.   Medications reviewed and include: folic acid , soul-cortef, MVI, nicotine, protonix , thiamine , NaCl @125ml /hr,  ceftriaxone  Labs reviewed: K 4.3 wnl, creat 1.60(H) P 3.1 wnl, Mg 1.8 wnl- 10/25 WBC- 14.0(H)  Patient is currently intubated on ventilator support MV: 9.4 L/min Temp (24hrs), Avg:100.3 F (37.9 C), Min:99 F (37.2 C), Max:101 F (38.3 C)  Propofol : stopped   MAP >80mmHg   UOP-   NUTRITION - FOCUSED PHYSICAL EXAM:  Flowsheet Row Most Recent Value  Orbital Region No depletion  Upper Arm Region No depletion  Thoracic and Lumbar Region No depletion  Buccal Region No depletion  Temple Region No depletion  Clavicle Bone Region No depletion  Clavicle and Acromion Bone Region No depletion  Scapular Bone Region No depletion  Dorsal Hand No depletion  Patellar Region Mild depletion  Anterior Thigh Region Mild depletion  Posterior Calf Region Mild depletion  Edema (Edward Weber Assessment) Moderate  Hair Reviewed  Eyes Reviewed  Mouth Reviewed  Skin Reviewed  Nails Reviewed   Diet Order:   Diet Order             Diet NPO time specified Except for: Other (See Comments)  Diet effective now                  EDUCATION NEEDS:   No education needs have been identified at this time  Skin:  Skin Assessment: Reviewed RN Assessment  Last BM:  10/25  Height:   Ht Readings from Last 1 Encounters:  02/04/24 5' 8 (1.727 m)    Weight:   Wt Readings from Last 1 Encounters:  02/07/24 79 kg  Ideal Body Weight:  70 kg  BMI:  Body mass index is 26.48 kg/m.  Estimated Nutritional Needs:   Kcal:  1968kcal/day  Protein:  105-120g/day  Fluid:  1.8-2.1L/day  Edward Shams Edward Weber, Edward Weber, Edward Weber If unable to be reached, please send secure chat to Edward Weber inpatient available from 8:00a-4:00p daily

## 2024-02-07 NOTE — Progress Notes (Signed)
   02/07/24 0700  Spiritual Encounters  Type of Visit Initial  Care provided to: Pt not available  Conversation partners present during Programmer, Systems;Other (comment) psychiatrist)  Referral source Code page  Reason for visit Code  OnCall Visit Yes   Chaplain responded to rapid. Chaplain arrived on unit and patient was being transferred to ICU. Chaplain went to ICU and spoke with nurse and sitter. Patient unavailable and being intubated. Chaplain services is available for follow up as needed.

## 2024-02-07 NOTE — Progress Notes (Signed)
 OT Cancellation Note  Patient Details Name: Edward Weber MRN: [id number] DOB: [birth year]   Cancelled Treatment:    Reason Eval/Treat Not Completed: Other (comment) (per chart, pt was transferred to CCU, is now intubated and sedated. OT will sign off, please re consult when appropriate.) Therisa Sheffield, OTD OTR/L  02/07/24, 8:53 AM

## 2024-02-08 ENCOUNTER — Inpatient Hospital Stay

## 2024-02-08 DIAGNOSIS — J690 Pneumonitis due to inhalation of food and vomit: Secondary | ICD-10-CM

## 2024-02-08 DIAGNOSIS — R079 Chest pain, unspecified: Secondary | ICD-10-CM | POA: Diagnosis not present

## 2024-02-08 DIAGNOSIS — D571 Sickle-cell disease without crisis: Secondary | ICD-10-CM

## 2024-02-08 DIAGNOSIS — A419 Sepsis, unspecified organism: Secondary | ICD-10-CM

## 2024-02-08 DIAGNOSIS — I10 Essential (primary) hypertension: Secondary | ICD-10-CM | POA: Diagnosis not present

## 2024-02-08 DIAGNOSIS — G9341 Metabolic encephalopathy: Secondary | ICD-10-CM | POA: Diagnosis not present

## 2024-02-08 DIAGNOSIS — F10931 Alcohol use, unspecified with withdrawal delirium: Secondary | ICD-10-CM | POA: Diagnosis not present

## 2024-02-08 DIAGNOSIS — J9601 Acute respiratory failure with hypoxia: Secondary | ICD-10-CM

## 2024-02-08 LAB — VITAMIN B12: Vitamin B-12: <150 pg/mL — ABNORMAL LOW (ref 180–914)

## 2024-02-08 LAB — GLUCOSE, CAPILLARY
Glucose-Capillary: 163 mg/dL — ABNORMAL HIGH (ref 70–99)
Glucose-Capillary: 134 mg/dL — ABNORMAL HIGH (ref 70–99)
Glucose-Capillary: 117 mg/dL — ABNORMAL HIGH (ref 70–99)
Glucose-Capillary: 152 mg/dL — ABNORMAL HIGH (ref 70–99)
Glucose-Capillary: 186 mg/dL — ABNORMAL HIGH (ref 70–99)
Glucose-Capillary: 155 mg/dL — ABNORMAL HIGH (ref 70–99)

## 2024-02-08 LAB — CBC
HCT: 32.9 % — ABNORMAL LOW (ref 39.0–52.0)
Hemoglobin: 10.6 g/dL — ABNORMAL LOW (ref 13.0–17.0)
MCH: 31.2 pg (ref 26.0–34.0)
MCHC: 32.2 g/dL (ref 30.0–36.0)
MCV: 96.8 fL (ref 80.0–100.0)
Platelets: 191 K/uL (ref 150–400)
RBC: 3.4 MIL/uL — ABNORMAL LOW (ref 4.22–5.81)
RDW: 14.6 % (ref 11.5–15.5)
WBC: 15.2 K/uL — ABNORMAL HIGH (ref 4.0–10.5)
nRBC: 0 % (ref 0.0–0.2)

## 2024-02-08 LAB — BASIC METABOLIC PANEL WITH GFR
Anion gap: 9 (ref 5–15)
BUN: 23 mg/dL (ref 8–23)
CO2: 21 mmol/L — ABNORMAL LOW (ref 22–32)
Calcium: 7.7 mg/dL — ABNORMAL LOW (ref 8.9–10.3)
Chloride: 108 mmol/L (ref 98–111)
Creatinine, Ser: 1.35 mg/dL — ABNORMAL HIGH (ref 0.61–1.24)
GFR, Estimated: 58 mL/min — ABNORMAL LOW (ref >60)
Glucose, Bld: 187 mg/dL — ABNORMAL HIGH (ref 70–99)
Potassium: 4.1 mmol/L (ref 3.5–5.1)
Sodium: 138 mmol/L (ref 135–145)

## 2024-02-08 LAB — MAGNESIUM: Magnesium: 2.2 mg/dL (ref 1.7–2.4)

## 2024-02-08 LAB — PHOSPHORUS: Phosphorus: 3.4 mg/dL (ref 2.5–4.6)

## 2024-02-08 MED ORDER — GABAPENTIN 250 MG/5ML PO SOLN
100.0000 mg | Freq: Three times a day (TID) | ORAL | Status: DC
  Administered 2024-02-08 – 2024-02-10 (×7): 100 mg
  Filled 2024-02-08 (×10): qty 2

## 2024-02-08 MED ORDER — HYDRALAZINE HCL 20 MG/ML IJ SOLN
10.0000 mg | INTRAMUSCULAR | Status: DC | PRN
  Administered 2024-02-09 – 2024-02-14 (×8): 20 mg via INTRAVENOUS
  Filled 2024-02-08 (×9): qty 1

## 2024-02-08 MED ORDER — AMLODIPINE BESYLATE 10 MG PO TABS
10.0000 mg | ORAL_TABLET | Freq: Every day | ORAL | Status: DC
  Administered 2024-02-08 – 2024-02-12 (×5): 10 mg
  Filled 2024-02-08 (×5): qty 1

## 2024-02-08 MED ORDER — CYANOCOBALAMIN 1000 MCG/ML IJ SOLN
1000.0000 ug | Freq: Every day | INTRAMUSCULAR | Status: DC
  Administered 2024-02-08 – 2024-02-09 (×2): 1000 ug via INTRAMUSCULAR
  Filled 2024-02-08 (×3): qty 1

## 2024-02-08 MED ORDER — IPRATROPIUM-ALBUTEROL 0.5-2.5 (3) MG/3ML IN SOLN
3.0000 mL | Freq: Three times a day (TID) | RESPIRATORY_TRACT | Status: DC
  Administered 2024-02-08 – 2024-02-13 (×14): 3 mL via RESPIRATORY_TRACT
  Filled 2024-02-08 (×14): qty 3

## 2024-02-08 NOTE — Progress Notes (Addendum)
 NAME:  Edward Weber, MRN:  [id number], DOB:  10-05-1960, LOS: 4 ADMISSION DATE:  02/04/2024, CONSULTATION DATE:  02/06/24 REFERRING MD: Kandis Asa REASON FOR CONSULT:  Acute Respiratory Distress   HPI  63 y.o male with significant PMH of HTN, CVA, sickle cell anemia, GERD, polysubstance abuse (EtOH abuse disorder, cocaine, marijuana tobacco abuse), and recurrent syncope who presented to the ED with chief complaints of syncopal episode. Per chart review, he has been having exertional chest pain, DOP for the past few days and today felt dizzy and passed out without hitting his head.  ED Course: Initial vital signs showed HR of 84beats/minute, BP203/102 mm Hg, the RR 20 breaths/minute, 98%O2 Sat on and Temp of 98.95F (36.7C).   Pertinent Labs/Diagnostics Findings: Na+/ K+:141/4.0 Glucose:166  WBC:7.3 K/L Hgb/Hct:11.5/34.9  COVID PCR: Negative,  UDS+ Marijuana CXR> CTH> CTA Chest> CT Abd/pelvis>negative for acute abnormality  Admitted to TRH service for further management of syncopal episode. SEE SIGNIFICANT HOSPITAL EVENTS BELOW.   Past Medical History    Alcoholism (HCC)     Allergic rhinitis     Allergic state     Anemia     Arthritis     Cancer (HCC)     Chronic hepatitis C (HCC)     Depression     Esophagitis, reflux     GERD (gastroesophageal reflux disease)     Gout     Helicobacter pylori gastritis     Hepatitis     Hypertension     Neuromuscular disorder (HCC)     Osteoarthrosis     Peripheral neuropathy     Rhabdomyolysis     Sickle cell anemia (HCC)     Stroke [facility])     Stroke [facility])     Vitiligo    Significant Hospital Events   10/24: Admitted to TRH service for syncopal workup.  Psych consulted for agitation and threatening to leave AMA. Due to history of polysubstance abuse and AMS patient was IVCd 10/25: Remains IVC due to agitation and delirium.  Phenobarb taper added per primary team. 10/26: Transferred to the ICU for worsening somnolent and  increased work of breathing requiring emergent intubation. 10/27: Remains critically ill requiring mechanical ventilation. Agitation requiring propofol . Remains on ceftriaxone for aspiration pna coverage.  Consults:  PCCM Psychiatric  Procedures:  10/26: Endotracheal intubation 10/26: Central Line placement  Interim History / Subjective:    -Remains intubated and sedated  Micro Data:  10/24: SARS-CoV-2 PCR> negative 10/24: Influenza PCR> negative 10:27 Respiratory Viral Panel>negative 10/27: Blood culture x2> 10/27: MRSA PCR>> negative 10/27: Strep pneumo Ur Ag 10/27: Legionella Ur Ag> 10/27: Respiratory Culture w/ Gram Stain >>  Antimicrobials:  Vancomycin Cefepime Azithromycin Ceftriaxone Metronidazole  OBJECTIVE  Blood pressure (!) 157/86, pulse 82, temperature 99.3 F (37.4 C), temperature source Oral, resp. rate (!) 27, height 5' 8 (1.727 m), weight 85.5 kg, SpO2 100%.    Vent Mode: PRVC FiO2 (%):  [30 %-35 %] 30 % Set Rate:  [20 bmp] 20 bmp Vt Set:  [450 mL] 450 mL PEEP:  [5 cmH20] 5 cmH20 Plateau Pressure:  [6 cmH20-15 cmH20] 6 cmH20   Intake/Output Summary (Last 24 hours) at 02/08/2024 0752 Last data filed at 02/08/2024 0600 Gross per 24 hour  Intake 3547.12 ml  Output 1064 ml  Net 2483.12 ml   Filed Weights   02/06/24 1825 02/07/24 0500 02/08/24 0500  Weight: 79 kg 79 kg 85.5 kg    Physical Examination  GEN: Critically ill  appearing male, intubated in NAD HENT: normocephalic, atraumatic, anicteric sclera with mild scleral edema HEART: RRR, S1, S2, no M/R/G,  LUNGS: no increased WOB, clear throughout with mild crackles R>L ABDOMINAL: soft, non-tender, non-distended, no rebound/guarding, bowel sounds x 4 EXTREMITIES: trace pretibial edema, radial pulses 2+, distal 1+ NEURO: intubated, not following commands, no gross focal deficits, non-purposeful movements, intermittently withdraws from pain of LE, PERRL SKIN: Intact, warm, scattered blisters of  right arm (improvement from yesterday)  Labs/imaging that I havepersonally reviewed  (right click and Reselect all SmartList Selections daily)   DG Chest Port 1 View Result Date: 02/08/2024 EXAM: 1 VIEW(S) XRAY OF THE CHEST 02/08/2024 05:10:24 AM COMPARISON: None available. CLINICAL HISTORY: [id number] Acute respiratory failure with hypoxia (HCC) [id number]. Acute respiratory failure with hypoxia Acute respiratory failure with hypoxia FINDINGS: LINES, TUBES AND DEVICES: The endotracheal tube has been advanced to just above the orifice of the left main bronchus and needs to be pulled back to the mid trachea at least 4 cm. The NGT terminates in the distal stomach. The right IJ line again has its tip in the upper right atrium. LUNGS AND PLEURA: No focal pulmonary opacity. No pulmonary edema. No pleural effusion. No pneumothorax. HEART AND MEDIASTINUM: No acute abnormality of the cardiac and mediastinal silhouettes. There is calcification of the transverse aorta. BONES AND SOFT TISSUES: No acute osseous abnormality. IMPRESSION: 1. Endotracheal tube tip positioned just above the left main bronchus; recommend retracting approximately 4 cm to the mid trachea. 2. Right internal jugular central line tip projects into the upper right atrium. 3. Nasogastric tube terminates in the distal stomach. 4. No acute Radiographic cardiopulmonary findings. 5. . These results will be telephoned to the referring provider or the referring providers representative by professional radiology assistant [facility]) personnel, with communication documented in the [facility] dashboard. Electronically signed by: Francis Quam MD 02/08/2024 05:20 AM EDT RP Workstation: [id number]   DG Abd 1 View Result Date: 02/07/2024 EXAM: 1 VIEW XRAY OF THE ABDOMEN 02/07/2024 03:09:34 AM COMPARISON: None available. CLINICAL HISTORY: FINDINGS: LINES, TUBES AND DEVICES: Enteric tube terminates in the gastric antrum, in satisfactory position. BOWEL: Nonobstructive bowel  gas pattern. SOFT TISSUES: 7 mm calculus projecting over the left kidney. BONES: No acute osseous abnormality. IMPRESSION: 1. Enteric tube in satisfactory position. Electronically signed by: Pinkie Pebbles MD 02/07/2024 03:13 AM EDT RP Workstation: [id number]   DG Chest 1 View Result Date: 02/07/2024 EXAM: 1 AP VIEW XRAY OF THE CHEST 02/07/2024 03:08:30 AM COMPARISON: 02/06/2024 CLINICAL HISTORY: ASPIRATION INTO AIRWAY FINDINGS: LINES, TUBES AND DEVICES: Right internal jugular central venous catheter in place with tip overlying the right atrium. Endotracheal tube in place with tip 1.7 cm above the carina. Enteric tube in place coursing into the mid stomach. LUNGS AND PLEURA: No focal pulmonary opacity. No pulmonary edema. No pleural effusion. No pneumothorax. HEART AND MEDIASTINUM: No acute abnormality of the cardiac and mediastinal silhouettes. BONES AND SOFT TISSUES: No acute osseous abnormality. IMPRESSION: 1. Endotracheal tube with tip 1.7 cm above the carina. 2. Right internal jugular central venous catheter with tip overlying the right atrium. 3. Enteric tube courses into the stomach. Electronically signed by: Pinkie Pebbles MD 02/07/2024 03:12 AM EDT RP Workstation: [id number]   DG Chest Port 1 View Result Date: 02/06/2024 EXAM: 1 VIEW(S) XRAY OF THE CHEST 02/06/2024 07:30:00 PM COMPARISON: 02/06/2024 CLINICAL HISTORY: Central line placement FINDINGS: LINES, TUBES AND DEVICES: Right internal jugular central venous catheter in place with tip in the right atrium. Endotracheal tube in place  with tip 2 cm above the carina. LUNGS AND PLEURA: No focal pulmonary opacity. No pulmonary edema. No pleural effusion. No pneumothorax. HEART AND MEDIASTINUM: No acute abnormality of the cardiac and mediastinal silhouettes. BONES AND SOFT TISSUES: No acute osseous abnormality. IMPRESSION: 1. Right internal jugular central venous catheter with tip in the right atrium, appropriate position. 2. Endotracheal tube with  tip 2 cm above the carina, appropriate position. Electronically signed by: Pinkie Pebbles MD 02/06/2024 07:31 PM EDT RP Workstation: [id number]   DG Chest Port 1 View Result Date: 02/06/2024 CLINICAL DATA:  Dyspnea. EXAM: PORTABLE CHEST 1 VIEW COMPARISON:  Radiograph and CT 02/04/2024 FINDINGS: Lung volumes are low.The cardiomediastinal contours are stable allowing for differences in technique. Pulmonary vasculature is normal. No consolidation, pleural effusion, or pneumothorax. No acute osseous abnormalities are seen. IMPRESSION: Low lung volumes without acute abnormality. Electronically Signed   By: Andrea Gasman M.D.   On: 02/06/2024 18:28    Labs   CBC: Recent Labs  Lab 02/04/24 0242 02/05/24 0400 02/07/24 0449 02/08/24 0416  WBC 7.3 8.8 14.0* 15.2*  NEUTROABS 5.5  --   --   --   HGB 11.5* 11.6* 11.5* 10.6*  HCT 34.9* 34.1* 35.6* 32.9*  MCV 94.3 92.9 95.7 96.8  PLT 166 169 204 191    Basic Metabolic Panel: Recent Labs  Lab 02/05/24 0400 02/06/24 0353 02/07/24 0202 02/07/24 0449 02/08/24 0416  NA 139 136 134* 136 138  K 3.6 3.4* 4.8 4.3 4.1  CL 104 103 101 103 108  CO2 23 24 22 22  21*  GLUCOSE 130* 123* 147* 144* 187*  BUN 7* 8 18 18 23   CREATININE 1.02 1.08 1.65* 1.60* 1.35*  CALCIUM  8.9 8.8* 8.5* 8.1* 7.7*  MG 1.8  --   --   --  2.2  PHOS 3.1  --   --   --  3.4   GFR: Estimated Creatinine Clearance: 58 mL/min (A) (by C-G formula based on SCr of 1.35 mg/dL (H)). Recent Labs  Lab 02/04/24 0242 02/05/24 0400 02/07/24 0202 02/07/24 0449 02/08/24 0416  PROCALCITON  --   --   --  <0.10  --   WBC 7.3 8.8  --  14.0* 15.2*  LATICACIDVEN  --   --  1.7  --   --     Liver Function Tests: Recent Labs  Lab 02/04/24 0242 02/07/24 0202  AST 28 22  ALT 22 17  ALKPHOS 44 36*  BILITOT 0.7 1.0  PROT 6.9 7.4  ALBUMIN 4.0 3.5   No results for input(s): LIPASE, AMYLASE in the last 168 hours. Recent Labs  Lab 02/07/24 0907  AMMONIA 19    ABG     Component Value Date/Time   PHART 7.46 (H) 02/06/2024 2005   PCO2ART 32 02/06/2024 2005   PO2ART 89 02/06/2024 2005   HCO3 22.8 02/06/2024 2005   ACIDBASEDEF 0.3 02/06/2024 2005   O2SAT 98.6 02/06/2024 2005     Coagulation Profile: No results for input(s): INR, PROTIME in the last 168 hours.  Cardiac Enzymes: No results for input(s): CKTOTAL, CKMB, CKMBINDEX, TROPONINI in the last 168 hours.  HbA1C: Hgb A1c MFr Bld  Date/Time Value Ref Range Status  02/04/2024 05:27 PM 8.1 (H) 4.8 - 5.6 % Final    Comment:    (NOTE) Diagnosis of Diabetes The following HbA1c ranges recommended by the American Diabetes Association (ADA) may be used as an aid in the diagnosis of diabetes mellitus.  Hemoglobin  Suggested A1C NGSP%              Diagnosis  <5.7                   Non Diabetic  5.7-6.4                Pre-Diabetic  >6.4                   Diabetic  <7.0                   Glycemic control for                       adults with diabetes.    05/18/2012 10:08 AM 5.7 (H) <5.7 % Final    Comment:    (NOTE)                                                                       According to the ADA Clinical Practice Recommendations for 2011, when HbA1c is used as a screening test:  >=6.5%   Diagnostic of Diabetes Mellitus           (if abnormal result is confirmed) 5.7-6.4%   Increased risk of developing Diabetes Mellitus References:Diagnosis and Classification of Diabetes Mellitus,Diabetes Care,2011,34(Suppl 1):S62-S69 and Standards of Medical Care in         Diabetes - 2011,Diabetes Care,2011,34 (Suppl 1):S11-S61.   CBG: Recent Labs  Lab 02/07/24 1757 02/07/24 1943 02/07/24 2315 02/08/24 0317 02/08/24 0727  GLUCAP 135* 118* 138* 163* 134*   Review of Systems:   Unable to be obtained secondary to the patient's intubated and sedated status.   Past Medical History  He,  has a past medical history of Alcoholism (HCC), Allergic rhinitis, Allergic  state, Anemia, Arthritis, Cancer (HCC), Chronic hepatitis C (HCC), Depression, Esophagitis, reflux, GERD (gastroesophageal reflux disease), Gout, Helicobacter pylori gastritis, Hepatitis, Hypertension, Neuromuscular disorder (HCC), Osteoarthrosis, Peripheral neuropathy, Rhabdomyolysis, Sickle cell anemia (HCC), Stroke (HCC), Stroke (HCC), and Vitiligo.   Surgical History    Past Surgical History:  Procedure Laterality Date   COLON SURGERY     COLONOSCOPY WITH PROPOFOL  N/A 05/09/2015   Procedure: COLONOSCOPY WITH PROPOFOL ;  Surgeon: Deward CINDERELLA Piedmont, MD;  Location[facility] ENDOSCOPY;  Service: Gastroenterology;  Laterality: N/A;   ESOPHAGOGASTRODUODENOSCOPY N/A 05/09/2015   Procedure: ESOPHAGOGASTRODUODENOSCOPY (EGD);  Surgeon: Deward CINDERELLA Piedmont, MD;  Location: [facility] ENDOSCOPY;  Service: Gastroenterology;  Laterality: N/A;   ESOPHAGOGASTRODUODENOSCOPY (EGD) WITH PROPOFOL  N/A 05/04/2017   Procedure: ESOPHAGOGASTRODUODENOSCOPY (EGD) WITH PROPOFOL ;  Surgeon: Toledo, Ladell POUR, MD;  Location: [facility] ENDOSCOPY;  Service: Gastroenterology;  Laterality: N/A;   FINGER DEBRIDEMENT  1976   s/p infected dog bite   TEE WITHOUT CARDIOVERSION N/A 05/20/2012   Procedure: TRANSESOPHAGEAL ECHOCARDIOGRAM (TEE);  Surgeon: Vina LULLA Gull, MD;  Location: [facility] ENDOSCOPY;  Service: Cardiovascular;  Laterality: N/A;   Social History   reports that he has been smoking cigarettes. He has never used smokeless tobacco. He reports current alcohol use of about 9.0 standard drinks of alcohol per week. He reports current drug use. Drugs: Marijuana and Cocaine.   Family History   His family history is not on file.   Allergies Allergies  Allergen Reactions   Bee Venom Anaphylaxis   Bovine (Beef) Protein Other (See Comments)    Personal preference   Bovine (Beef) Protein-Containing Drug Products     Personal preference   Lactalbumin    Milk-Related Compounds Nausea And Vomiting    Intoloerance   Pegademase Bovine     Other reaction(s):  Unknown Personal preference Personal preference Personal preference    Tilactase Nausea And Vomiting    Intoloerance   Milk (Cow) Nausea And Vomiting    Intoloerance    Home Medications  Prior to Admission medications   Medication Sig Start Date End Date Taking? Authorizing Provider  albuterol  (PROVENTIL  HFA;VENTOLIN  HFA) 108 (90 Base) MCG/ACT inhaler Inhale 2 puffs into the lungs daily as needed for wheezing or shortness of breath.   Yes [provider]  AMBULATORY NON FORMULARY MEDICATION Trimix (30/1/10)-(Pap/Phent/PGE)  Test Dose  1ml vial   Qty #3 Refills 0  [facility] [phone] Fax [phone] 01/14/23  Yes McGowan, Clotilda A, PA-C  HYDROcodone -acetaminophen  (NORCO/VICODIN) 5-325 MG tablet Take 1 tablet by mouth every 6 (six) hours as needed. 01/18/24  Yes [provider]  lisinopril-hydrochlorothiazide  (ZESTORETIC) 20-12.5 MG tablet Take 2 tablets by mouth daily. 01/14/24  Yes [provider]  traZODone (DESYREL) 50 MG tablet Take 50 mg by mouth at bedtime. 12/28/23 12/27/24 Yes [provider]  triamcinolone ointment (KENALOG) 0.1 % Apply topically.   Yes [provider]  acetaminophen  (TYLENOL ) 500 MG tablet Take by mouth. Patient not taking: Reported on 02/04/2024    [provider]  amLODipine (NORVASC) 5 MG tablet Take 5 mg by mouth daily.    [provider]  aspirin  EC 81 MG tablet Take 1 tablet (81 mg total) by mouth daily. Swallow whole. Patient not taking: Reported on 02/04/2024 09/21/22   Dorinda Drue DASEN, MD  escitalopram  (LEXAPRO ) 20 MG tablet Take 20 mg by mouth daily. Patient not taking: Reported on 02/04/2024    [provider]  folic acid  (FOLVITE ) 1 MG tablet Take 1 tablet (1 mg total) by mouth daily. Patient not taking: Reported on 02/04/2024 09/21/22   Dorinda Drue DASEN, MD  gabapentin  (NEURONTIN ) 100 MG capsule Take 100 mg by mouth 3 (three) times daily.    [provider]   oxyCODONE -acetaminophen  (PERCOCET/ROXICET) 5-325 MG tablet Take 1 tablet by mouth every 6 (six) hours as needed. Patient not taking: Reported on 02/04/2024 09/17/23   [provider]  pantoprazole  (PROTONIX ) 40 MG tablet Take 40 mg by mouth daily.    [provider]  predniSONE  (DELTASONE ) 50 MG tablet Take 1 tablet (50 mg total) by mouth daily with breakfast. Patient taking differently: Take 10 mg by mouth daily with breakfast. 01/05/24   Arlander Charleston, MD  sildenafil  (VIAGRA ) 100 MG tablet Take 1 tablet (100 mg total) by mouth daily as needed for erectile dysfunction. Patient not taking: Reported on 02/04/2024 06/24/22   Penne Knee, MD  Scheduled Meds:  chlordiazePOXIDE  25 mg Per Tube TID   Followed by   NOREEN ON 02/09/2024] chlordiazePOXIDE  25 mg Per Tube BH-qamhs   Followed by   NOREEN ON 02/10/2024] chlordiazePOXIDE  25 mg Per Tube Daily   Chlorhexidine Gluconate Cloth  6 each Topical Daily   folic acid   1 mg Oral Daily   fondaparinux (ARIXTRA) injection  2.5 mg Subcutaneous Q24H   free water  30 mL Per Tube Q4H   hydrocortisone sod succinate (SOLU-CORTEF) inj  100 mg Intravenous BID  insulin aspart  0-15 Units Subcutaneous Q4H   ipratropium-albuterol   3 mL Nebulization Q6H   multivitamin with minerals  1 tablet Oral Daily   nicotine  21 mg Transdermal Daily   mouth rinse  15 mL Mouth Rinse Q2H   pantoprazole  (PROTONIX ) IV  40 mg Intravenous Q12H   thiamine  (VITAMIN B1) injection  100 mg Intravenous Q24H   Continuous Infusions:  cefTRIAXone (ROCEPHIN)  IV Stopped (02/07/24 2052)   feeding supplement (KATE FARMS STANDARD ENT 1.4) 30 mL/hr at 02/08/24 0600   PRN Meds:.acetaminophen  **OR** acetaminophen , chlordiazePOXIDE, fentaNYL  (SUBLIMAZE ) injection, hydrALAZINE , ipratropium-albuterol , labetalol , loperamide, midazolam  PF, nitroGLYCERIN , mouth rinse, polyethylene glycol  Active Hospital Problem list   See systems below  Assessment & Plan:   #Acute  Metabolic Encephalopathy #EtOH Abuse with complicated withdrawal #Polysubstance Abuse Hx: Polysubstance abuse, Anxiety, Depression 10/24: CT Head negative 10/24: UDS + cannabinoid, opiates - PAD protocol - Fentanyl  prn to maintain RASS goal 0 to -1 - No longer requiring propofol  and precedex - Maintain Mag >2 and Potassium >4 - Provide supportive care - Promote normal sleep/wake cycle - CIWA protocol - Continue thiamine , folate and MVI - Management of metabolic derangements - Librium taper - Psych following; appreciate input - Ammonia wnl   #Syncope likely secondary to substance abuse #Hypertension #Chest Pain Hx: Hypertension Home Meds: Lisinopril hydrochlorothiazide , amlodipine 10/24 Chest CTA: negative for PE 10/25 ECHO: EF 60-65%, mild left ventricular hypertrophy; Grade I diastolic dysfunction - Continuous cardiac monitoring - Vasopressors to maintain MAP goal >65 ~ not currently requiring - Initially hypertensive on admission; normotensive while sedated - Labetalol  + Nitroglycerin  prn  - Troponins WNL on admission - Intermittent EKG; no previous changes - Restart antihypertensives once able to tolerate PO   #Acute Hypoxic Respiratory Failure secondary to Aspiration Pneumonia Hx: ?COPD, current everyday smoker - Continue full mechanical support; on minimal settings, on pressure support this am - Wean FiO2 and PEEP as able; maintain O2 >90% - VAP Bundle - Pulmonary Hygiene - Lung protective ventilation - Wake up Assessment and SBT when parameters met; mental status preventing extubation - Intermittent CXR and ABG - Stress Dose Steroids: Hydrocortisone 100mg     #Sepsis secondary to Suspected Aspiration Pneumonia - Trend WBC and monitor fever curve - WBC: 15.2 with Tmax 102.7 in previous 24 hours - Respiratory panel negative - Cultures pending - Lactic normalized - Procalcitonin <0.10 - Continue Ceftriaxone  - IVF as needed     #Acute Kidney Injury  -  Strict I/O's - Trend BMP and renal function - Monitor Mag and maintain >2 - Ensure adequate renal perfusion - Avoid nephrotoxic medications as able - Electrolyte replacement protocol as indicated - Pharmacy to assist with replacement   #Alcoholic Liver Disease #Chronic Hepatitis #GERD without Esophagitis #GI Prophylaxis - Monitor hepatic function - LFTs wnl - Continue PPI: Protonix  40mg  Q12H - Dietician input for nutrition   #Sickle Cell Anemia - Trend CBC - Monitor H/H, platelets and coags - Transfuse if Hgb < 7; Hgb at his baseline (10.8-11.5) - Monitor for s/sx of bleeding - DVT prophylaxis: Fondaparinux 2.5 mg daily   #Type II Diabetes Mellitus  #Peripheral Neuropathy Home Medications: Gabapentin  - ICU hypo/hyperglycemia protocol - CBG Q4H - SSI as indicated - Glucose goal: 140-180     Best practice:  Diet:  NPO Pain/Anxiety/Delirium protocol (if indicated): Yes (RASS goal -1) VAP protocol (if indicated): Yes DVT prophylaxis: Fondaparinux GI prophylaxis: PPI Glucose control:  SSI No Central venous access:  Yes, and it is  still needed Arterial line:  N/A Foley:  Yes, and it is still needed Mobility:  bed rest  PT/OT consulted: N/A Code Status:  full code Disposition: ICU   = Goals of Care =  Primary Emergency ContactBETHA Caro Rase, Home Phone: [phone]   Critical care time: 60 minutes      Robet Kim, PA-C [facility] PCCM Team Contact Info: [phone]

## 2024-02-08 NOTE — Progress Notes (Signed)
 PHARMACY CONSULT NOTE - ELECTROLYTES  Pharmacy Consult for Electrolyte Monitoring and Replacement   Recent Labs: Height: 5' 8 (172.7 cm) Weight: 85.5 kg (188 lb 7.9 oz) IBW/kg (Calculated) : 68.4 Estimated Creatinine Clearance: 58 mL/min (A) (by C-G formula based on SCr of 1.35 mg/dL (H)). Potassium (mmol/L)  Date Value  02/08/2024 4.1  07/17/2014 4.0   Magnesium  (mg/dL)  Date Value  89/71/7974 2.2   Calcium  (mg/dL)  Date Value  89/71/7974 7.7 (L)   Calcium , Total (mg/dL)  Date Value  95/94/7983 9.3   Albumin (g/dL)  Date Value  89/72/7974 3.5  04/01/2014 3.5   Phosphorus (mg/dL)  Date Value  89/71/7974 3.4   Sodium (mmol/L)  Date Value  02/08/2024 138  07/17/2014 140   Corrected Ca: 8.1 mg/dL  Assessment  Edward Weber is a 63 y.o. male presenting with syncope. PMH significant for HTN, CVA, sickle cell anemia, GERD, polysubstance abuse. Pharmacy has been consulted to monitor and replace electrolytes.  Diet: NPIO- OG tube  - Feeding supplement - [name] Standard ENT 1.4 @ 60 mL/hr  MIVF: - free water 30mL q2-4h PRN  Pertinent medications: n/a  Goal of Therapy: Electrolytes WNL  Plan:  No electrolyte replacement indicated at this time.  Check BMP, Mg, Phos with AM labs  Thank you for allowing pharmacy to be a part of this patient's care.  Bernardino George, PharmD Candidate [phone] [facility] [employer] [facility] 02/08/2024 7:22 AM

## 2024-02-08 NOTE — Plan of Care (Signed)

## 2024-02-09 ENCOUNTER — Inpatient Hospital Stay

## 2024-02-09 DIAGNOSIS — F10931 Alcohol use, unspecified with withdrawal delirium: Secondary | ICD-10-CM | POA: Diagnosis not present

## 2024-02-09 DIAGNOSIS — R079 Chest pain, unspecified: Secondary | ICD-10-CM | POA: Diagnosis not present

## 2024-02-09 DIAGNOSIS — I10 Essential (primary) hypertension: Secondary | ICD-10-CM | POA: Diagnosis not present

## 2024-02-09 DIAGNOSIS — G9341 Metabolic encephalopathy: Secondary | ICD-10-CM | POA: Diagnosis not present

## 2024-02-09 LAB — CBC
HCT: 30.9 % — ABNORMAL LOW (ref 39.0–52.0)
Hemoglobin: 10 g/dL — ABNORMAL LOW (ref 13.0–17.0)
MCH: 31.3 pg (ref 26.0–34.0)
MCHC: 32.4 g/dL (ref 30.0–36.0)
MCV: 96.9 fL (ref 80.0–100.0)
Platelets: 226 K/uL (ref 150–400)
RBC: 3.19 MIL/uL — ABNORMAL LOW (ref 4.22–5.81)
RDW: 14.6 % (ref 11.5–15.5)
WBC: 13 K/uL — ABNORMAL HIGH (ref 4.0–10.5)
nRBC: 0 % (ref 0.0–0.2)

## 2024-02-09 LAB — HGB FRACTIONATION CASCADE
Hgb A2: 2.5 % (ref 1.8–3.2)
Hgb A: 97.5 % (ref 96.4–98.8)
Hgb F: 0 % (ref 0.0–2.0)
Hgb S: 0 %

## 2024-02-09 LAB — GLUCOSE, CAPILLARY
Glucose-Capillary: 161 mg/dL — ABNORMAL HIGH (ref 70–99)
Glucose-Capillary: 151 mg/dL — ABNORMAL HIGH (ref 70–99)
Glucose-Capillary: 133 mg/dL — ABNORMAL HIGH (ref 70–99)
Glucose-Capillary: 184 mg/dL — ABNORMAL HIGH (ref 70–99)
Glucose-Capillary: 182 mg/dL — ABNORMAL HIGH (ref 70–99)
Glucose-Capillary: 154 mg/dL — ABNORMAL HIGH (ref 70–99)

## 2024-02-09 LAB — PHOSPHORUS: Phosphorus: 2.8 mg/dL (ref 2.5–4.6)

## 2024-02-09 LAB — HCV RT-PCR, QUANT (NON-GRAPH): Hepatitis C Quantitation: NOT DETECTED [IU]/mL

## 2024-02-09 LAB — BASIC METABOLIC PANEL WITH GFR
Anion gap: 14 (ref 5–15)
BUN: 25 mg/dL — ABNORMAL HIGH (ref 8–23)
CO2: 21 mmol/L — ABNORMAL LOW (ref 22–32)
Calcium: 8.3 mg/dL — ABNORMAL LOW (ref 8.9–10.3)
Chloride: 107 mmol/L (ref 98–111)
Creatinine, Ser: 1.06 mg/dL (ref 0.61–1.24)
GFR, Estimated: >60 mL/min (ref >60)
Glucose, Bld: 174 mg/dL — ABNORMAL HIGH (ref 70–99)
Potassium: 4.2 mmol/L (ref 3.5–5.1)
Sodium: 142 mmol/L (ref 135–145)

## 2024-02-09 LAB — STREP PNEUMONIAE URINARY ANTIGEN: Strep Pneumo Urinary Antigen: NEGATIVE

## 2024-02-09 LAB — MAGNESIUM: Magnesium: 2.2 mg/dL (ref 1.7–2.4)

## 2024-02-09 LAB — HCV AB W REFLEX TO QUANT PCR: HCV Ab: REACTIVE — AB

## 2024-02-09 LAB — PROCALCITONIN: Procalcitonin: 0.25 ng/mL

## 2024-02-09 NOTE — Progress Notes (Signed)
 Patient transported on ventilator to MRI and back with no complications.  O2 saturation and vital signs remained stable throughout.

## 2024-02-09 NOTE — Plan of Care (Signed)
  Problem: Clinical Measurements: Goal: Cardiovascular complication will be avoided Outcome: Progressing   Problem: Nutrition: Goal: Adequate nutrition will be maintained Outcome: Progressing   Problem: Elimination: Goal: Will not experience complications related to urinary retention Outcome: Progressing   Problem: Pain Managment: Goal: General experience of comfort will improve and/or be controlled Outcome: Progressing   Problem: Education: Goal: Knowledge of General Education information will improve Description: Including pain rating scale, medication(s)/side effects and non-pharmacologic comfort measures Outcome: Not Progressing   Problem: Health Behavior/Discharge Planning: Goal: Ability to manage health-related needs will improve Outcome: Not Progressing   Problem: Clinical Measurements: Goal: Ability to maintain clinical measurements within normal limits will improve Outcome: Not Progressing

## 2024-02-09 NOTE — Progress Notes (Signed)
 PHARMACY CONSULT NOTE - ELECTROLYTES  Pharmacy Consult for Electrolyte Monitoring and Replacement   Recent Labs: Height: 5' 8 (172.7 cm) Weight: 86 kg (189 lb 9.5 oz) IBW/kg (Calculated) : 68.4 Estimated Creatinine Clearance: 74.1 mL/min (by C-G formula based on SCr of 1.06 mg/dL). Potassium (mmol/L)  Date Value  02/09/2024 4.2  07/17/2014 4.0   Magnesium  (mg/dL)  Date Value  89/70/7974 2.2   Calcium  (mg/dL)  Date Value  89/70/7974 8.3 (L)   Calcium , Total (mg/dL)  Date Value  95/94/7983 9.3   Albumin (g/dL)  Date Value  89/72/7974 3.5  04/01/2014 3.5   Phosphorus (mg/dL)  Date Value  89/70/7974 2.8   Sodium (mmol/L)  Date Value  02/09/2024 142  07/17/2014 140    Assessment  Edward Weber is a 63 y.o. male presenting with syncope. PMH significant for HTN, CVA, sickle cell anemia, GERD, polysubstance abuse. Pharmacy has been consulted to monitor and replace electrolytes.  Diet: NPO- OG tube  - Feeding supplement - [name] Standard ENT 1.4 @ 60 mL/hr  MIVF: - free water 30mL q2-4h PRN  Pertinent medications: n/a  Goal of Therapy: Electrolytes WNL  Plan:  No electrolyte replacement indicated at this time.  Check BMP, Mg, Phos with AM labs  Thank you for allowing pharmacy to be a part of this patient's care.  Bernardino George, PharmD Candidate [phone] [facility] [employer] 02/09/2024 7:19 AM

## 2024-02-09 NOTE — Progress Notes (Signed)
 Updated pts daughter Yi Haugan via telephone regarding pts condition and current plan of care.  All questions were answered.  Lonell Moose, AGNP  Pulmonary/Critical Care Pager [phone] (please enter 7 digits) PCCM Consult Pager [phone] (please enter 7 digits)

## 2024-02-09 NOTE — Progress Notes (Signed)
 NAME:  Edward Weber, MRN:  [id number], DOB:  10-Oct-1960, LOS: 5 ADMISSION DATE:  02/04/2024, CONSULTATION DATE:  02/06/24 REFERRING MD: Kandis Asa REASON FOR CONSULT:  Acute Respiratory Distress   HPI  63 y.o male with significant PMH of HTN, CVA, sickle cell anemia, GERD, polysubstance abuse (EtOH abuse disorder, cocaine, marijuana tobacco abuse), and recurrent syncope who presented to the ED with chief complaints of syncopal episode. Per chart review, he has been having exertional chest pain, DOP for the past few days and today felt dizzy and passed out without hitting his head.  ED Course: Initial vital signs showed HR of 84beats/minute, BP203/102 mm Hg, the RR 20 breaths/minute, 98%O2 Sat on and Temp of 98.5F (36.7C).   Pertinent Labs/Diagnostics Findings: Na+/ K+:141/4.0 Glucose:166  WBC:7.3 K/L Hgb/Hct:11.5/34.9  COVID PCR: Negative,  UDS+ Marijuana CXR> CTH> CTA Chest> CT Abd/pelvis>negative for acute abnormality  Admitted to TRH service for further management of syncopal episode. SEE SIGNIFICANT HOSPITAL EVENTS BELOW.   Past Medical History    Alcoholism (HCC)     Allergic rhinitis     Allergic state     Anemia     Arthritis     Cancer (HCC)     Chronic hepatitis C (HCC)     Depression     Esophagitis, reflux     GERD (gastroesophageal reflux disease)     Gout     Helicobacter pylori gastritis     Hepatitis     Hypertension     Neuromuscular disorder (HCC)     Osteoarthrosis     Peripheral neuropathy     Rhabdomyolysis     Sickle cell anemia (HCC)     Stroke [facility])     Stroke [facility])     Vitiligo    Significant Hospital Events   10/24: Admitted to TRH service for syncopal workup.  Psych consulted for agitation and threatening to leave AMA. Due to history of polysubstance abuse and AMS patient was IVCd 10/25: Remains IVC due to agitation and delirium.  Phenobarb taper added per primary team. 10/26: Transferred to the ICU for worsening somnolent and  increased work of breathing requiring emergent intubation. 10/27: Remains critically ill requiring mechanical ventilation. Agitation requiring propofol . Remains on ceftriaxone for aspiration pna coverage. 10/28: Pt remains mechanically intubated unable to liberate from ventilator due to poor neurological exam and excessive secretions.  Pending MRI Brain   Interim History / Subjective:    As outlined above under significant events   Micro Data:  10/24: SARS-CoV-2 PCR> negative 10/24: Influenza PCR> negative 10:27: Respiratory Viral Panel>negative 10/27: Blood culture x2>NGTD 10/27: MRSA PCR>>negative 10/27: Strep pneumo Ur Ag>> 10/27: Legionella Ur Ag>> 10/27: Respiratory Culture w/ Gram Stain >>rare wbc present, predominantly pmn and rare gram positive cocci   Anti-infectives (From admission, onward)    Start     Dose/Rate Route Frequency Ordered Stop   02/06/24 2000  cefTRIAXone (ROCEPHIN) 2 g in sodium chloride  0.9 % 100 mL IVPB        2 g 200 mL/hr over 30 Minutes Intravenous Every 24 hours 02/06/24 1910 02/11/24 1959        OBJECTIVE  Blood pressure (!) 145/89, pulse 97, temperature 99.5 F (37.5 C), temperature source Oral, resp. rate (!) 24, height 5' 8 (1.727 m), weight 86 kg, SpO2 96%.    Vent Mode: PRVC FiO2 (%):  [28 %-30 %] 28 % Set Rate:  [20 bmp] 20 bmp Vt Set:  [450 mL] 450 mL  PEEP:  [5 cmH20] 5 cmH20 Pressure Support:  [0 cmH20-5 cmH20] 0 cmH20   Intake/Output Summary (Last 24 hours) at 02/09/2024 0730 Last data filed at 02/09/2024 9381 Gross per 24 hour  Intake 1200 ml  Output 975 ml  Net 225 ml   Filed Weights   02/07/24 0500 02/08/24 0500 02/09/24 0500  Weight: 79 kg 85.5 kg 86 kg    Physical Examination  GEN: Acutely-ill male, NAD mechanically intubated  HENT: Normocephalic, atraumatic, anicteric sclera with mild scleral edema HEART: NSR, s1s2, no m/r/g, 2+ radial/1+ distal pulses, 1+ RUE edema  LUNGS: Faint rhonchi throughout, even, non  labored ABDOMINAL: +BS x4, obese, soft, non distended  EXTREMITIES: Normal bulk and tone  NEURO: Not following commands, opens eyes with repeated stimulation, withdraws from painful stimulation  SKIN: Intact, warm, scattered blisters of right upper extremity  Labs/imaging that I havepersonally reviewed  (right click and Reselect all SmartList Selections daily)   DG Chest Port 1 View Result Date: 02/08/2024 EXAM: 1 VIEW(S) XRAY OF THE CHEST 02/08/2024 05:10:24 AM COMPARISON: None available. CLINICAL HISTORY: [id number] Acute respiratory failure with hypoxia (HCC) [id number]. Acute respiratory failure with hypoxia Acute respiratory failure with hypoxia FINDINGS: LINES, TUBES AND DEVICES: The endotracheal tube has been advanced to just above the orifice of the left main bronchus and needs to be pulled back to the mid trachea at least 4 cm. The NGT terminates in the distal stomach. The right IJ line again has its tip in the upper right atrium. LUNGS AND PLEURA: No focal pulmonary opacity. No pulmonary edema. No pleural effusion. No pneumothorax. HEART AND MEDIASTINUM: No acute abnormality of the cardiac and mediastinal silhouettes. There is calcification of the transverse aorta. BONES AND SOFT TISSUES: No acute osseous abnormality. IMPRESSION: 1. Endotracheal tube tip positioned just above the left main bronchus; recommend retracting approximately 4 cm to the mid trachea. 2. Right internal jugular central line tip projects into the upper right atrium. 3. Nasogastric tube terminates in the distal stomach. 4. No acute Radiographic cardiopulmonary findings. 5. . These results will be telephoned to the referring provider or the referring providers representative by professional radiology assistant [facility]) personnel, with communication documented in the [facility] dashboard. Electronically signed by: Francis Quam MD 02/08/2024 05:20 AM EDT RP Workstation: [id number]    Labs   CBC: Recent Labs  Lab 02/04/24 0242  02/05/24 0400 02/07/24 0449 02/08/24 0416 02/09/24 0443  WBC 7.3 8.8 14.0* 15.2* 13.0*  NEUTROABS 5.5  --   --   --   --   HGB 11.5* 11.6* 11.5* 10.6* 10.0*  HCT 34.9* 34.1* 35.6* 32.9* 30.9*  MCV 94.3 92.9 95.7 96.8 96.9  PLT 166 169 204 191 226    Basic Metabolic Panel: Recent Labs  Lab 02/05/24 0400 02/06/24 0353 02/07/24 0202 02/07/24 0449 02/08/24 0416 02/09/24 0443  NA 139 136 134* 136 138 142  K 3.6 3.4* 4.8 4.3 4.1 4.2  CL 104 103 101 103 108 107  CO2 23 24 22 22  21* 21*  GLUCOSE 130* 123* 147* 144* 187* 174*  BUN 7* 8 18 18 23  25*  CREATININE 1.02 1.08 1.65* 1.60* 1.35* 1.06  CALCIUM  8.9 8.8* 8.5* 8.1* 7.7* 8.3*  MG 1.8  --   --   --  2.2 2.2  PHOS 3.1  --   --   --  3.4 2.8   GFR: Estimated Creatinine Clearance: 74.1 mL/min (by C-G formula based on SCr of 1.06 mg/dL). Recent Labs  Lab 02/05/24 0400  02/07/24 0202 02/07/24 0449 02/08/24 0416 02/09/24 0443  PROCALCITON  --   --  <0.10  --   --   WBC 8.8  --  14.0* 15.2* 13.0*  LATICACIDVEN  --  1.7  --   --   --     Liver Function Tests: Recent Labs  Lab 02/04/24 0242 02/07/24 0202  AST 28 22  ALT 22 17  ALKPHOS 44 36*  BILITOT 0.7 1.0  PROT 6.9 7.4  ALBUMIN 4.0 3.5   No results for input(s): LIPASE, AMYLASE in the last 168 hours. Recent Labs  Lab 02/07/24 0907  AMMONIA 19    ABG    Component Value Date/Time   PHART 7.46 (H) 02/06/2024 2005   PCO2ART 32 02/06/2024 2005   PO2ART 89 02/06/2024 2005   HCO3 22.8 02/06/2024 2005   ACIDBASEDEF 0.3 02/06/2024 2005   O2SAT 98.6 02/06/2024 2005     Coagulation Profile: No results for input(s): INR, PROTIME in the last 168 hours.  Cardiac Enzymes: No results for input(s): CKTOTAL, CKMB, CKMBINDEX, TROPONINI in the last 168 hours.  HbA1C: Hgb A1c MFr Bld  Date/Time Value Ref Range Status  02/04/2024 05:27 PM 8.1 (H) 4.8 - 5.6 % Final    Comment:    (NOTE) Diagnosis of Diabetes The following HbA1c ranges recommended  by the American Diabetes Association (ADA) may be used as an aid in the diagnosis of diabetes mellitus.  Hemoglobin             Suggested A1C NGSP%              Diagnosis  <5.7                   Non Diabetic  5.7-6.4                Pre-Diabetic  >6.4                   Diabetic  <7.0                   Glycemic control for                       adults with diabetes.    05/18/2012 10:08 AM 5.7 (H) <5.7 % Final    Comment:    (NOTE)                                                                       According to the ADA Clinical Practice Recommendations for 2011, when HbA1c is used as a screening test:  >=6.5%   Diagnostic of Diabetes Mellitus           (if abnormal result is confirmed) 5.7-6.4%   Increased risk of developing Diabetes Mellitus References:Diagnosis and Classification of Diabetes Mellitus,Diabetes Care,2011,34(Suppl 1):S62-S69 and Standards of Medical Care in         Diabetes - 2011,Diabetes Care,2011,34 (Suppl 1):S11-S61.   CBG: Recent Labs  Lab 02/08/24 1127 02/08/24 1639 02/08/24 1915 02/08/24 2309 02/09/24 0328  GLUCAP 117* 152* 186* 155* 161*   Review of Systems:   Unable to be obtained secondary to the patient's intubated and sedated status.   Past Medical History  He,  has a past medical history of Alcoholism (HCC), Allergic rhinitis, Allergic state, Anemia, Arthritis, Cancer (HCC), Chronic hepatitis C (HCC), Depression, Esophagitis, reflux, GERD (gastroesophageal reflux disease), Gout, Helicobacter pylori gastritis, Hepatitis, Hypertension, Neuromuscular disorder (HCC), Osteoarthrosis, Peripheral neuropathy, Rhabdomyolysis, Sickle cell anemia (HCC), Stroke (HCC), Stroke (HCC), and Vitiligo.   Surgical History    Past Surgical History:  Procedure Laterality Date   COLON SURGERY     COLONOSCOPY WITH PROPOFOL  N/A 05/09/2015   Procedure: COLONOSCOPY WITH PROPOFOL ;  Surgeon: Deward CINDERELLA Piedmont, MD;  Location: [facility] ENDOSCOPY;  Service: Gastroenterology;   Laterality: N/A;   ESOPHAGOGASTRODUODENOSCOPY N/A 05/09/2015   Procedure: ESOPHAGOGASTRODUODENOSCOPY (EGD);  Surgeon: Deward CINDERELLA Piedmont, MD;  Location: [facility] ENDOSCOPY;  Service: Gastroenterology;  Laterality: N/A;   ESOPHAGOGASTRODUODENOSCOPY (EGD) WITH PROPOFOL  N/A 05/04/2017   Procedure: ESOPHAGOGASTRODUODENOSCOPY (EGD) WITH PROPOFOL ;  Surgeon: Toledo, Ladell POUR, MD;  Location: [facility] ENDOSCOPY;  Service: Gastroenterology;  Laterality: N/A;   FINGER DEBRIDEMENT  1976   s/p infected dog bite   TEE WITHOUT CARDIOVERSION N/A 05/20/2012   Procedure: TRANSESOPHAGEAL ECHOCARDIOGRAM (TEE);  Surgeon: Vina LULLA Gull, MD;  Location: [facility] ENDOSCOPY;  Service: Cardiovascular;  Laterality: N/A;   Social History   reports that he has been smoking cigarettes. He has never used smokeless tobacco. He reports current alcohol use of about 9.0 standard drinks of alcohol per week. He reports current drug use. Drugs: Marijuana and Cocaine.   Family History   His family history is not on file.   Allergies Allergies  Allergen Reactions   Bee Venom Anaphylaxis   Bovine (Beef) Protein Other (See Comments)    Personal preference   Bovine (Beef) Protein-Containing Drug Products     Personal preference   Lactalbumin    Milk-Related Compounds Nausea And Vomiting    Intoloerance   Pegademase Bovine     Other reaction(s): Unknown Personal preference Personal preference Personal preference    Tilactase Nausea And Vomiting    Intoloerance   Milk (Cow) Nausea And Vomiting    Intoloerance    Home Medications  Prior to Admission medications   Medication Sig Start Date End Date Taking? Authorizing Provider  albuterol  (PROVENTIL  HFA;VENTOLIN  HFA) 108 (90 Base) MCG/ACT inhaler Inhale 2 puffs into the lungs daily as needed for wheezing or shortness of breath.   Yes [provider]  AMBULATORY NON FORMULARY MEDICATION Trimix (30/1/10)-(Pap/Phent/PGE)  Test Dose  1ml vial   Qty #3 Refills 0  [facility] [phone] Fax [phone] 01/14/23  Yes McGowan, Clotilda A, PA-C  HYDROcodone -acetaminophen  (NORCO/VICODIN) 5-325 MG tablet Take 1 tablet by mouth every 6 (six) hours as needed. 01/18/24  Yes [provider]  lisinopril-hydrochlorothiazide  (ZESTORETIC) 20-12.5 MG tablet Take 2 tablets by mouth daily. 01/14/24  Yes [provider]  traZODone (DESYREL) 50 MG tablet Take 50 mg by mouth at bedtime. 12/28/23 12/27/24 Yes [provider]  triamcinolone ointment (KENALOG) 0.1 % Apply topically.   Yes [provider]  acetaminophen  (TYLENOL ) 500 MG tablet Take by mouth. Patient not taking: Reported on 02/04/2024    [provider]  amLODipine (NORVASC) 5 MG tablet Take 5 mg by mouth daily.    [provider]  aspirin  EC 81 MG tablet Take 1 tablet (81 mg total) by mouth daily. Swallow whole. Patient not taking: Reported on 02/04/2024 09/21/22   Dorinda Drue DASEN, MD  escitalopram  (LEXAPRO ) 20 MG tablet Take 20 mg by mouth daily. Patient not taking: Reported on 02/04/2024    [provider]  folic acid  (FOLVITE ) 1 MG tablet Take 1 tablet (1 mg total) by mouth daily. Patient not taking: Reported on 02/04/2024 09/21/22   Dorinda Drue DASEN, MD  gabapentin  (NEURONTIN ) 100 MG capsule Take 100 mg by mouth 3 (three) times daily.    [provider]  oxyCODONE -acetaminophen  (PERCOCET/ROXICET) 5-325 MG tablet Take 1 tablet by mouth every 6 (six) hours as needed. Patient not taking: Reported on 02/04/2024 09/17/23   [provider]  pantoprazole  (PROTONIX ) 40 MG tablet Take 40 mg by mouth daily.    [provider]  predniSONE  (DELTASONE ) 50 MG tablet Take 1 tablet (50 mg total) by mouth daily with breakfast. Patient taking differently: Take 10 mg by mouth daily with breakfast. 01/05/24   Arlander Charleston, MD  sildenafil  (VIAGRA ) 100 MG tablet Take 1 tablet (100 mg total) by mouth daily as needed for erectile dysfunction. Patient  not taking: Reported on 02/04/2024 06/24/22   Penne Knee, MD  Scheduled Meds:  amLODipine  10 mg Per Tube Daily   chlordiazePOXIDE  25 mg Per Tube BH-qamhs   Followed by   NOREEN ON 02/10/2024] chlordiazePOXIDE  25 mg Per Tube Daily   Chlorhexidine Gluconate Cloth  6 each Topical Daily   cyanocobalamin  1,000 mcg Intramuscular Daily   folic acid   1 mg Oral Daily   fondaparinux (ARIXTRA) injection  2.5 mg Subcutaneous Q24H   free water  30 mL Per Tube Q4H   gabapentin   100 mg Per Tube TID   insulin aspart  0-15 Units Subcutaneous Q4H   ipratropium-albuterol   3 mL Nebulization TID   multivitamin with minerals  1 tablet Oral Daily   nicotine  21 mg Transdermal Daily   mouth rinse  15 mL Mouth Rinse Q2H   pantoprazole  (PROTONIX ) IV  40 mg Intravenous Q12H   thiamine  (VITAMIN B1) injection  100 mg Intravenous Q24H   Continuous Infusions:  cefTRIAXone (ROCEPHIN)  IV Stopped (02/08/24 2054)   feeding supplement (KATE FARMS STANDARD ENT 1.4) 60 mL/hr at 02/09/24 0618   PRN Meds:.acetaminophen  **OR** acetaminophen , chlordiazePOXIDE, fentaNYL  (SUBLIMAZE ) injection, hydrALAZINE , ipratropium-albuterol , labetalol , loperamide, midazolam  PF, nitroGLYCERIN , mouth rinse, polyethylene glycol  Active Hospital Problem list   See systems below  Assessment & Plan:   #Acute Metabolic Encephalopathy #EtOH Abuse with complicated withdrawal #Polysubstance Abuse #Mechanical intubation pain/discomfort  Hx: Polysubstance abuse, Anxiety, Depression 10/24: CT Head negative 10/24: UDS + cannabinoid, opiates - Correct metabolic derangements  - Continue folic acid , thiamine , and mvi  - CIWA protocol: continue scheduled librium taper and prn librium  - Maintain RASS goal 0 to -1 - PAD protocol to maintain RASS goal: prn versed  and fentanyl   - WUA daily  - MRI Brain pending   #Syncope likely secondary to substance abuse #Hypertension #Chest Pain 10/24 Chest CTA: negative for PE 10/25 ECHO: EF  60-65%, mild left ventricular hypertrophy; Grade I diastolic dysfunction - Continuous telemetry monitoring  - Continue scheduled amlodipine and prn labetalol /hydralazine  for bp management  - US  Venous RUE to f/o VTE   #Acute Kidney Injury~improving   - Trend BMP - Strict I&O's - Avoid nephrotoxic agents  - Electrolyte replacement protocol as indicated: pharmacy to assist with replacement  #Acute Hypoxic Respiratory Failure secondary to Aspiration Pneumonia #Mechanical Ventilation  Hx: ?COPD and current everyday smoker - Continue full mechanical support; on minimal settings, on pressure support this am - Continue lung protective strategies - Maintain plateau pressures less than 30 cm H2O  - VAP bundle implemented  - WUA and SBT  once parameters met; mental status precluding extubation - Continue scheduled and prn bronchodilator therapy  - Intermittent CXR and ABG  #Sepsis secondary to Suspected Aspiration Pneumonia - Trend WBC and monitor fever curve - Follow cultures  - Continue abx as outlined above pending culture results and sensitivities    #Alcoholic Liver Disease #Chronic Hepatitis #GERD without Esophagitis - Trend hepatic function panel  - Continue PPI - Dietician input for nutrition: Continue TF's   #Sickle Cell Anemia - Trend CBC - Monitor H/H, platelets and coags - Transfuse if Hgb < 7; Hgb at his baseline (10.8-11.5) - Monitor for s/sx of bleeding - DVT prophylaxis: Fondaparinux 2.5 mg daily  #Type II Diabetes Mellitus  #Peripheral Neuropathy - Follow ICU hypo/hyperglycemia protocol - CBG Q4H - SSI as indicated - Target CBG readings:140-180   Critical care time: 50 minutes     Lonell Moose, AGNP  Pulmonary/Critical Care Pager [phone] (please enter 7 digits) PCCM Consult Pager [phone] (please enter 7 digits)

## 2024-02-10 ENCOUNTER — Inpatient Hospital Stay

## 2024-02-10 ENCOUNTER — Encounter: Payer: Self-pay | Admitting: Internal Medicine

## 2024-02-10 DIAGNOSIS — R4182 Altered mental status, unspecified: Secondary | ICD-10-CM | POA: Diagnosis not present

## 2024-02-10 DIAGNOSIS — R55 Syncope and collapse: Secondary | ICD-10-CM

## 2024-02-10 DIAGNOSIS — R569 Unspecified convulsions: Secondary | ICD-10-CM | POA: Diagnosis not present

## 2024-02-10 DIAGNOSIS — R293 Abnormal posture: Secondary | ICD-10-CM | POA: Diagnosis not present

## 2024-02-10 DIAGNOSIS — I6340 Cerebral infarction due to embolism of unspecified cerebral artery: Secondary | ICD-10-CM

## 2024-02-10 DIAGNOSIS — G9341 Metabolic encephalopathy: Secondary | ICD-10-CM | POA: Diagnosis not present

## 2024-02-10 DIAGNOSIS — F10931 Alcohol use, unspecified with withdrawal delirium: Secondary | ICD-10-CM | POA: Diagnosis not present

## 2024-02-10 DIAGNOSIS — R079 Chest pain, unspecified: Secondary | ICD-10-CM | POA: Diagnosis not present

## 2024-02-10 DIAGNOSIS — I10 Essential (primary) hypertension: Secondary | ICD-10-CM | POA: Diagnosis not present

## 2024-02-10 LAB — HEPARIN LEVEL (UNFRACTIONATED): Heparin Unfractionated: 0.75 [IU]/mL — ABNORMAL HIGH (ref 0.30–0.70)

## 2024-02-10 LAB — CBC
HCT: 30.8 % — ABNORMAL LOW (ref 39.0–52.0)
Hemoglobin: 9.9 g/dL — ABNORMAL LOW (ref 13.0–17.0)
MCH: 31.1 pg (ref 26.0–34.0)
MCHC: 32.1 g/dL (ref 30.0–36.0)
MCV: 96.9 fL (ref 80.0–100.0)
Platelets: 268 K/uL (ref 150–400)
RBC: 3.18 MIL/uL — ABNORMAL LOW (ref 4.22–5.81)
RDW: 14.8 % (ref 11.5–15.5)
WBC: 12.4 K/uL — ABNORMAL HIGH (ref 4.0–10.5)
nRBC: 0 % (ref 0.0–0.2)

## 2024-02-10 LAB — BASIC METABOLIC PANEL WITH GFR
Anion gap: 9 (ref 5–15)
BUN: 32 mg/dL — ABNORMAL HIGH (ref 8–23)
CO2: 24 mmol/L (ref 22–32)
Calcium: 8.6 mg/dL — ABNORMAL LOW (ref 8.9–10.3)
Chloride: 109 mmol/L (ref 98–111)
Creatinine, Ser: 1.15 mg/dL (ref 0.61–1.24)
GFR, Estimated: >60 mL/min (ref >60)
Glucose, Bld: 248 mg/dL — ABNORMAL HIGH (ref 70–99)
Potassium: 4.6 mmol/L (ref 3.5–5.1)
Sodium: 142 mmol/L (ref 135–145)

## 2024-02-10 LAB — GLUCOSE, CAPILLARY
Glucose-Capillary: 176 mg/dL — ABNORMAL HIGH (ref 70–99)
Glucose-Capillary: 199 mg/dL — ABNORMAL HIGH (ref 70–99)
Glucose-Capillary: 157 mg/dL — ABNORMAL HIGH (ref 70–99)
Glucose-Capillary: 182 mg/dL — ABNORMAL HIGH (ref 70–99)
Glucose-Capillary: 184 mg/dL — ABNORMAL HIGH (ref 70–99)
Glucose-Capillary: 189 mg/dL — ABNORMAL HIGH (ref 70–99)

## 2024-02-10 LAB — LEGIONELLA PNEUMOPHILA SEROGP 1 UR AG: L. pneumophila Serogp 1 Ur Ag: NEGATIVE

## 2024-02-10 LAB — MAGNESIUM: Magnesium: 2.4 mg/dL (ref 1.7–2.4)

## 2024-02-10 LAB — CULTURE, RESPIRATORY W GRAM STAIN

## 2024-02-10 LAB — AMMONIA: Ammonia: <13 umol/L (ref 9–35)

## 2024-02-10 LAB — APTT: aPTT: 79 s — ABNORMAL HIGH (ref 24–36)

## 2024-02-10 LAB — PHOSPHORUS: Phosphorus: 3.3 mg/dL (ref 2.5–4.6)

## 2024-02-10 MED ORDER — CYANOCOBALAMIN 1000 MCG/ML IJ SOLN
1000.0000 ug | Freq: Every day | INTRAMUSCULAR | Status: AC
  Administered 2024-02-10 – 2024-02-15 (×4): 1000 ug via INTRAMUSCULAR
  Filled 2024-02-10 (×6): qty 1

## 2024-02-10 MED ORDER — CHLORHEXIDINE GLUCONATE CLOTH 2 % EX PADS
6.0000 | MEDICATED_PAD | Freq: Every day | CUTANEOUS | Status: DC
  Administered 2024-02-10 – 2024-02-21 (×12): 6 via TOPICAL

## 2024-02-10 MED ORDER — THIAMINE HCL 100 MG/ML IJ SOLN
100.0000 mg | INTRAMUSCULAR | Status: DC
Start: 2024-02-14 — End: 2024-02-15
  Administered 2024-02-14 – 2024-02-15 (×2): 100 mg via INTRAVENOUS
  Filled 2024-02-10 (×2): qty 2

## 2024-02-10 MED ORDER — LABETALOL HCL 100 MG PO TABS
50.0000 mg | ORAL_TABLET | Freq: Two times a day (BID) | ORAL | Status: DC
  Administered 2024-02-10 – 2024-02-12 (×5): 50 mg
  Filled 2024-02-10 (×5): qty 0.5

## 2024-02-10 MED ORDER — THIAMINE HCL 100 MG/ML IJ SOLN
500.0000 mg | Freq: Every day | INTRAVENOUS | Status: AC
  Administered 2024-02-11 – 2024-02-13 (×3): 500 mg via INTRAVENOUS
  Filled 2024-02-10 (×3): qty 5

## 2024-02-10 MED ORDER — HEPARIN (PORCINE) 25000 UT/250ML-% IV SOLN
1350.0000 [IU]/h | INTRAVENOUS | Status: DC
  Administered 2024-02-10: 1400 [IU]/h via INTRAVENOUS
  Administered 2024-02-11 – 2024-02-12 (×2): 1350 [IU]/h via INTRAVENOUS
  Filled 2024-02-10 (×3): qty 250

## 2024-02-10 MED ORDER — HEPARIN BOLUS VIA INFUSION
5250.0000 [IU] | Freq: Once | INTRAVENOUS | Status: AC
  Administered 2024-02-10: 5250 [IU] via INTRAVENOUS
  Filled 2024-02-10: qty 5250

## 2024-02-10 MED ORDER — B COMPLEX-C PO TABS
1.0000 | ORAL_TABLET | Freq: Every day | ORAL | Status: DC
  Administered 2024-02-10 – 2024-02-12 (×3): 1
  Filled 2024-02-10 (×4): qty 1

## 2024-02-10 MED ORDER — THIAMINE MONONITRATE 100 MG PO TABS
500.0000 mg | ORAL_TABLET | Freq: Every day | ORAL | Status: DC
Start: 2024-02-11 — End: 2024-02-10

## 2024-02-10 NOTE — Plan of Care (Signed)
  Problem: Clinical Measurements: Goal: Respiratory complications will improve Outcome: Progressing Goal: Cardiovascular complication will be avoided Outcome: Progressing   Problem: Nutrition: Goal: Adequate nutrition will be maintained Outcome: Progressing   Problem: Elimination: Goal: Will not experience complications related to urinary retention Outcome: Progressing   Problem: Education: Goal: Knowledge of General Education information will improve Description: Including pain rating scale, medication(s)/side effects and non-pharmacologic comfort measures Outcome: Not Progressing   Problem: Health Behavior/Discharge Planning: Goal: Ability to manage health-related needs will improve Outcome: Not Progressing

## 2024-02-10 NOTE — Consult Note (Signed)
 PHARMACY - ANTICOAGULATION CONSULT NOTE  Pharmacy Consult for heparin  Indication: VTE treatment  Allergies  Allergen Reactions   Bee Venom Anaphylaxis   Bovine (Beef) Protein Other (See Comments)    Personal preference   Bovine (Beef) Protein-Containing Drug Products     Personal preference   Lactalbumin    Milk-Related Compounds Nausea And Vomiting    Intoloerance   Pegademase Bovine     Other reaction(s): Unknown Personal preference Personal preference Personal preference    Tilactase Nausea And Vomiting    Intoloerance   Milk (Cow) Nausea And Vomiting    Intoloerance    Patient Measurements: Height: 5' 8 (172.7 cm) Weight: 86 kg (189 lb 9.5 oz) IBW/kg (Calculated) : 68.4 HEPARIN  DW (KG): 87.4  Weight Used: 87.4 kg  Vital Signs: Temp: 99.7 F (37.6 C) (10/30 0800) Temp Source: Oral (10/30 0800) BP: 145/86 (10/30 0900) Pulse Rate: 100 (10/30 0900)  Labs: Recent Labs    02/08/24 0416 02/09/24 0443 02/10/24 0439  HGB 10.6* 10.0* 9.9*  HCT 32.9* 30.9* 30.8*  PLT 191 226 268  CREATININE 1.35* 1.06 1.15    Estimated Creatinine Clearance: 68.3 mL/min (by C-G formula based on SCr of 1.15 mg/dL).   Medical History: Past Medical History:  Diagnosis Date   Alcoholism (HCC)    Allergic rhinitis    Allergic state    Anemia    Arthritis    Cancer (HCC)    Chronic hepatitis C (HCC)    Depression    Esophagitis, reflux    GERD (gastroesophageal reflux disease)    Gout    Helicobacter pylori gastritis    Hepatitis    Hypertension    Neuromuscular disorder (HCC)    Osteoarthrosis    Peripheral neuropathy    Rhabdomyolysis    Sickle cell anemia (HCC)    Stroke (HCC)    Stroke (HCC)    Vitiligo     Medications:  Scheduled:   amLODipine  10 mg Per Tube Daily   Chlorhexidine Gluconate Cloth  6 each Topical Daily   cyanocobalamin  1,000 mcg Intramuscular Daily   folic acid   1 mg Oral Daily   fondaparinux (ARIXTRA) injection  2.5 mg Subcutaneous  Q24H   free water  30 mL Per Tube Q4H   insulin aspart  0-15 Units Subcutaneous Q4H   ipratropium-albuterol   3 mL Nebulization TID   multivitamin with minerals  1 tablet Oral Daily   nicotine  21 mg Transdermal Daily   mouth rinse  15 mL Mouth Rinse Q2H   pantoprazole  (PROTONIX ) IV  40 mg Intravenous Q12H   [START ON 02/14/2024] thiamine  (VITAMIN B1) injection  100 mg Intravenous Q24H   Infusions:   cefTRIAXone (ROCEPHIN)  IV Stopped (02/09/24 2123)   feeding supplement (KATE FARMS STANDARD ENT 1.4) 60 mL/hr at 02/10/24 0600   [START ON 02/11/2024] thiamine  (VITAMIN B1) injection     PRN: acetaminophen  **OR** acetaminophen , fentaNYL  (SUBLIMAZE ) injection, hydrALAZINE , ipratropium-albuterol , labetalol , midazolam  PF, mouth rinse, polyethylene glycol Anti-infectives (From admission, onward)    Start     Dose/Rate Route Frequency Ordered Stop   02/06/24 2000  cefTRIAXone (ROCEPHIN) 2 g in sodium chloride  0.9 % 100 mL IVPB        2 g 200 mL/hr over 30 Minutes Intravenous Every 24 hours 02/06/24 1910 02/11/24 1959       Assessment: Edward Weber is a 63 y.o. male with PMH significant for  HTN, CVA, sickle cell anemia, GERD, polysubstance abuse. Presented with chief  complaint of syncopal episode.   10/30 US  Venous RUE significant for occlusive thrombus in the right basilic and radial veins.   Patient had been receiving fondaparinux 2.5mg  subcutaneously every 24 hours for prophylaxis.  - Last dose 10/29 @ 1405  Goal of Therapy:  Heparin  level 0.3-0.7 units/ml aPTT 66-102 seconds  Monitor platelets by anticoagulation protocol: Yes  Plan: Give heparin  5250 units bolus x 1 Start heparin  infusion at 1400 units/hr Check aPTT level in 6 hours and daily while on heparin , until correlation with Anti-Xa level Continue to monitor H&H and platelets  Bernardino George, PharmD Candidate [address] [employer] [facility] [employer] [facility] 02/10/2024 12:15 PM

## 2024-02-10 NOTE — Plan of Care (Signed)
  Problem: Clinical Measurements: Goal: Ability to maintain clinical measurements within normal limits will improve Outcome: Progressing Goal: Will remain free from infection Outcome: Progressing Goal: Diagnostic test results will improve Outcome: Progressing Goal: Cardiovascular complication will be avoided Outcome: Progressing   Problem: Activity: Goal: Risk for activity intolerance will decrease Outcome: Progressing   Problem: Nutrition: Goal: Adequate nutrition will be maintained Outcome: Progressing   Problem: Elimination: Goal: Will not experience complications related to bowel motility Outcome: Progressing Goal: Will not experience complications related to urinary retention Outcome: Progressing   Problem: Pain Managment: Goal: General experience of comfort will improve and/or be controlled Outcome: Progressing   Problem: Safety: Goal: Ability to remain free from injury will improve Outcome: Progressing   Problem: Skin Integrity: Goal: Risk for impaired skin integrity will decrease Outcome: Progressing   Problem: Fluid Volume: Goal: Ability to maintain a balanced intake and output will improve Outcome: Progressing   Problem: Metabolic: Goal: Ability to maintain appropriate glucose levels will improve Outcome: Progressing   Problem: Tissue Perfusion: Goal: Adequacy of tissue perfusion will improve Outcome: Progressing   Problem: Clinical Measurements: Goal: Respiratory complications will improve Outcome: Not Progressing

## 2024-02-10 NOTE — Progress Notes (Signed)
 NAME:  Edward Weber, MRN:  [id number], DOB:  1961/02/28, LOS: 6 ADMISSION DATE:  02/04/2024, CONSULTATION DATE:  02/06/24 REFERRING MD: Kandis Asa REASON FOR CONSULT:  Acute Respiratory Distress   HPI  63 y.o male with significant PMH of HTN, CVA, sickle cell anemia, GERD, polysubstance abuse (EtOH abuse disorder, cocaine, marijuana tobacco abuse), and recurrent syncope who presented to the ED with chief complaints of syncopal episode. Per chart review, he has been having exertional chest pain, DOP for the past few days and today felt dizzy and passed out without hitting his head.  ED Course: Initial vital signs showed HR of 84beats/minute, BP203/102 mm Hg, the RR 20 breaths/minute, 98%O2 Sat on and Temp of 98.83F (36.7C).   Pertinent Labs/Diagnostics Findings: Na+/ K+:141/4.0 Glucose:166  WBC:7.3 K/L Hgb/Hct:11.5/34.9  COVID PCR: Negative,  UDS+ Marijuana CXR> CTH> CTA Chest> CT Abd/pelvis>negative for acute abnormality  Admitted to TRH service for further management of syncopal episode. SEE SIGNIFICANT HOSPITAL EVENTS BELOW.   Past Medical History    Alcoholism (HCC)     Allergic rhinitis     Allergic state     Anemia     Arthritis     Cancer (HCC)     Chronic hepatitis C (HCC)     Depression     Esophagitis, reflux     GERD (gastroesophageal reflux disease)     Gout     Helicobacter pylori gastritis     Hepatitis     Hypertension     Neuromuscular disorder (HCC)     Osteoarthrosis     Peripheral neuropathy     Rhabdomyolysis     Sickle cell anemia (HCC)     Stroke [facility])     Stroke [facility])     Vitiligo    Significant Hospital Events   10/24: Admitted to TRH service for syncopal workup.  Psych consulted for agitation and threatening to leave AMA. Due to history of polysubstance abuse and AMS patient was IVCd 10/25: Remains IVC due to agitation and delirium.  Phenobarb taper added per primary team. 10/26: Transferred to the ICU for worsening somnolent and  increased work of breathing requiring emergent intubation. 10/27: Remains critically ill requiring mechanical ventilation. Agitation requiring propofol . Remains on ceftriaxone for aspiration pna coverage. 10/28: Pt remains mechanically intubated unable to liberate from ventilator due to poor neurological exam and excessive secretions.  Pending MRI Brain  10/30: Remains critically ill requiring mechanically intubation. Extubation precluded due to neurological status. MRI found subacute infarcts. Will consult Neuro for further evaluation and order an EEG.  Interim History / Subjective:    As outlined above under significant events   Micro Data:  10/24: SARS-CoV-2 PCR> negative 10/24: Influenza PCR> negative 10:27: Respiratory Viral Panel>negative 10/27: Blood culture x2>NGTD 10/27: MRSA PCR>>negative 10/27: Strep pneumo Ur Ag>> 10/27: Legionella Ur Ag>> 10/27: Respiratory Culture w/ Gram Stain >>rare wbc present, predominantly pmn and rare gram positive cocci   Anti-infectives (From admission, onward)    Start     Dose/Rate Route Frequency Ordered Stop   02/06/24 2000  cefTRIAXone (ROCEPHIN) 2 g in sodium chloride  0.9 % 100 mL IVPB        2 g 200 mL/hr over 30 Minutes Intravenous Every 24 hours 02/06/24 1910 02/11/24 1959        OBJECTIVE  Blood pressure (!) 152/83, pulse (!) 103, temperature 99.7 F (37.6 C), temperature source Oral, resp. rate (!) 22, height 5' 8 (1.727 m), weight 86 kg, SpO2 96%.  Vent Mode: PRVC FiO2 (%):  [28 %] 28 % Set Rate:  [20 bmp] 20 bmp Vt Set:  [450 mL] 450 mL PEEP:  [5 cmH20] 5 cmH20 Plateau Pressure:  [8 cmH20-12 cmH20] 8 cmH20   Intake/Output Summary (Last 24 hours) at 02/10/2024 0850 Last data filed at 02/10/2024 0800 Gross per 24 hour  Intake 1754.87 ml  Output 1250 ml  Net 504.87 ml   Filed Weights   02/07/24 0500 02/08/24 0500 02/09/24 0500  Weight: 79 kg 85.5 kg 86 kg    Physical Examination  GEN: Acutely-ill male, NAD  mechanically intubated  HENT: Normocephalic, atraumatic, anicteric sclera with mild scleral edema HEART: NSR, s1s2, no m/r/g, 2+ radial/1+ distal pulses, 2+ RUE edema  LUNGS: Faint rhonchi throughout, even, non labored ABDOMINAL: +BS x4, obese, soft, non distended  EXTREMITIES: Normal bulk and tone  NEURO: Not following commands, opens eyes with repeated verbal stimulation, withdraws from painful stimulation intermittently  SKIN: Intact, warm, scattered blisters of right upper extremity  Labs/imaging that I havepersonally reviewed  (right click and Reselect all SmartList Selections daily)   DG Abd 1 View Result Date: 02/10/2024 EXAM: 1 VIEW XRAY OF THE ABDOMEN 02/10/2024 01:38:22 AM COMPARISON: None available. CLINICAL HISTORY: Encounter for imaging study to confirm orogastric (OG) tube placement [id number]. NG tube placement. FINDINGS: LINES, TUBES AND DEVICES: Enteric tube courses below the hemidiaphragm with the tip overlying the expected region of the pyloric region/first portion of the duodenum. BOWEL: Nonobstructive bowel gas pattern. SOFT TISSUES: No opaque urinary calculi. BONES: No acute osseous abnormality. IMPRESSION: 1. Enteric tube tip overlies the region of the pylorus/first portion of the duodenum, appropriate for post-pyloric placement. Electronically signed by: Morgane Naveau MD 02/10/2024 01:42 AM EDT RP Workstation: [id number]   MR BRAIN WO CONTRAST Result Date: 02/10/2024 EXAM: MRI BRAIN WITHOUT CONTRAST 02/09/2024 01:59:31 PM TECHNIQUE: Multiplanar multisequence MRI of the head/brain was performed without the administration of intravenous contrast. COMPARISON: Comparison made with prior CT from 02/04/2024. CLINICAL HISTORY: Mental status change, unknown cause. Pt on vent.63 y.o male with significant PMH of HTN, CVA, sickle cell anemia, GERD, polysubstance abuse (EtOH abuse disorder, cocaine, marijuana tobacco abuse), and recurrent syncope who presented to the ED with chief  complaints of syncopal episode. ; Per chart review, he has been having exertional chest pain, DOP for the past few days and today felt dizzy and passed out without hitting his head. FINDINGS: BRAIN AND VENTRICLES: Patchy T2/FLAIR hypertensity involving the periventricular and deep white matter of both cerebral hemispheres, consistent with chronic small vessel ischemic disease, moderate in nature. Patchy involvement of the pons noted. Multiple superimposed remote lacunar infarcts are present about the bilateral basal ganglia, thalami, and pons. Small remote right cerebellar infarct. A prominent remote infarct involving the right basal ganglia/external capsule is noted with associated chronic hemosiderin staining (series 9, image 36). There are a few subtle subcentimeter foci of mild diffusion signal abnormality involving the subcortical aspect of the posterior left frontal parietal region (series 5, images 38, 34). Findings likely reflect evolving subacute ischemic infarcts; these are non-acute/recent in appearance. No associated acute hemorrhage or mass effect with these evolving infarcts. Few additional punctate chronic microhemorrhages are noted within the left cerebral hemisphere, likely small vessel related. No acute intracranial hemorrhage. No mass. No midline shift. No hydrocephalus. The sella is unremarkable. Normal flow voids. ORBITS: No acute abnormality. SINUSES AND MASTOIDS: Mild scattered mucosal thickening is present about the ethmoid air cells. BONES AND SOFT TISSUES: Normal marrow signal. Postoperative changes  are noted within the visualized upper cervical spine. Patient is intubated. No acute soft tissue abnormality. IMPRESSION: 1. Small subtle foci of diffusion signal abnormality involving the subcortical posterior left frontal parietal region likely evolving small subacute ischemic infarcts. These are not acute/recent in appearance. 2. No other acute intracranial abnormality. 3. Underlying moderate  chronic microvascular ischemic disease with multiple remote lacunar infarcts about the deep gray nuclei, pons, and cerebellum. Electronically signed by: Morene Hoard MD 02/10/2024 12:36 AM EDT RP Workstation: [id number]   US  Venous Img Lower Unilateral Right (DVT) Result Date: 02/09/2024 CLINICAL DATA:  Edema EXAM: RIGHT LOWER EXTREMITY VENOUS DOPPLER ULTRASOUND TECHNIQUE: Gray-scale sonography with compression, as well as color and duplex ultrasound, were performed to evaluate the deep venous system(s) from the level of the common femoral vein through the popliteal and proximal calf veins. COMPARISON:  None available FINDINGS: VENOUS Normal compressibility of the common femoral, superficial femoral, and popliteal veins, as well as the visualized calf veins. Visualized portions of profunda femoral vein and great saphenous vein unremarkable. No filling defects to suggest DVT on grayscale or color Doppler imaging. Doppler waveforms show normal direction of venous flow, normal respiratory plasticity and response to augmentation. Limited views of the contralateral common femoral vein are unremarkable. OTHER None. Limitations: none IMPRESSION: No right lower extremity DVT. Electronically Signed   By: Aliene Lloyd M.D.   On: 02/09/2024 12:26    Labs   CBC: Recent Labs  Lab 02/04/24 0242 02/05/24 0400 02/07/24 0449 02/08/24 0416 02/09/24 0443 02/10/24 0439  WBC 7.3 8.8 14.0* 15.2* 13.0* 12.4*  NEUTROABS 5.5  --   --   --   --   --   HGB 11.5* 11.6* 11.5* 10.6* 10.0* 9.9*  HCT 34.9* 34.1* 35.6* 32.9* 30.9* 30.8*  MCV 94.3 92.9 95.7 96.8 96.9 96.9  PLT 166 169 204 191 226 268    Basic Metabolic Panel: Recent Labs  Lab 02/05/24 0400 02/06/24 0353 02/07/24 0202 02/07/24 0449 02/08/24 0416 02/09/24 0443 02/10/24 0439  NA 139   < > 134* 136 138 142 142  K 3.6   < > 4.8 4.3 4.1 4.2 4.6  CL 104   < > 101 103 108 107 109  CO2 23   < > 22 22 21* 21* 24  GLUCOSE 130*   < > 147* 144* 187*  174* 248*  BUN 7*   < > 18 18 23  25* 32*  CREATININE 1.02   < > 1.65* 1.60* 1.35* 1.06 1.15  CALCIUM  8.9   < > 8.5* 8.1* 7.7* 8.3* 8.6*  MG 1.8  --   --   --  2.2 2.2 2.4  PHOS 3.1  --   --   --  3.4 2.8 3.3   < > = values in this interval not displayed.   GFR: Estimated Creatinine Clearance: 68.3 mL/min (by C-G formula based on SCr of 1.15 mg/dL). Recent Labs  Lab 02/07/24 0202 02/07/24 0449 02/08/24 0416 02/09/24 0443 02/10/24 0439  PROCALCITON  --  <0.10  --  0.25  --   WBC  --  14.0* 15.2* 13.0* 12.4*  LATICACIDVEN 1.7  --   --   --   --     Liver Function Tests: Recent Labs  Lab 02/04/24 0242 02/07/24 0202  AST 28 22  ALT 22 17  ALKPHOS 44 36*  BILITOT 0.7 1.0  PROT 6.9 7.4  ALBUMIN 4.0 3.5   No results for input(s): LIPASE, AMYLASE in the last 168  hours. Recent Labs  Lab 02/07/24 0907  AMMONIA 19    ABG    Component Value Date/Time   PHART 7.46 (H) 02/06/2024 2005   PCO2ART 32 02/06/2024 2005   PO2ART 89 02/06/2024 2005   HCO3 22.8 02/06/2024 2005   ACIDBASEDEF 0.3 02/06/2024 2005   O2SAT 98.6 02/06/2024 2005     Coagulation Profile: No results for input(s): INR, PROTIME in the last 168 hours.  Cardiac Enzymes: No results for input(s): CKTOTAL, CKMB, CKMBINDEX, TROPONINI in the last 168 hours.  HbA1C: Hgb A1c MFr Bld  Date/Time Value Ref Range Status  02/04/2024 05:27 PM 8.1 (H) 4.8 - 5.6 % Final    Comment:    (NOTE) Diagnosis of Diabetes The following HbA1c ranges recommended by the American Diabetes Association (ADA) may be used as an aid in the diagnosis of diabetes mellitus.  Hemoglobin             Suggested A1C NGSP%              Diagnosis  <5.7                   Non Diabetic  5.7-6.4                Pre-Diabetic  >6.4                   Diabetic  <7.0                   Glycemic control for                       adults with diabetes.    05/18/2012 10:08 AM 5.7 (H) <5.7 % Final    Comment:    (NOTE)                                                                        According to the ADA Clinical Practice Recommendations for 2011, when HbA1c is used as a screening test:  >=6.5%   Diagnostic of Diabetes Mellitus           (if abnormal result is confirmed) 5.7-6.4%   Increased risk of developing Diabetes Mellitus References:Diagnosis and Classification of Diabetes Mellitus,Diabetes Care,2011,34(Suppl 1):S62-S69 and Standards of Medical Care in         Diabetes - 2011,Diabetes Care,2011,34 (Suppl 1):S11-S61.   CBG: Recent Labs  Lab 02/09/24 1625 02/09/24 1951 02/09/24 2325 02/10/24 0352 02/10/24 0735  GLUCAP 184* 182* 154* 176* 199*   Review of Systems:   Unable to be obtained secondary to the patient's intubated and sedated status.   Past Medical History  He,  has a past medical history of Alcoholism (HCC), Allergic rhinitis, Allergic state, Anemia, Arthritis, Cancer (HCC), Chronic hepatitis C (HCC), Depression, Esophagitis, reflux, GERD (gastroesophageal reflux disease), Gout, Helicobacter pylori gastritis, Hepatitis, Hypertension, Neuromuscular disorder (HCC), Osteoarthrosis, Peripheral neuropathy, Rhabdomyolysis, Sickle cell anemia (HCC), Stroke (HCC), Stroke (HCC), and Vitiligo.   Surgical History    Past Surgical History:  Procedure Laterality Date   COLON SURGERY     COLONOSCOPY WITH PROPOFOL  N/A 05/09/2015   Procedure: COLONOSCOPY WITH PROPOFOL ;  Surgeon: Deward CINDERELLA Piedmont, MD;  Location: [facility] ENDOSCOPY;  Service:  Gastroenterology;  Laterality: N/A;   ESOPHAGOGASTRODUODENOSCOPY N/A 05/09/2015   Procedure: ESOPHAGOGASTRODUODENOSCOPY (EGD);  Surgeon: Deward CINDERELLA Piedmont, MD;  Location: [facility] ENDOSCOPY;  Service: Gastroenterology;  Laterality: N/A;   ESOPHAGOGASTRODUODENOSCOPY (EGD) WITH PROPOFOL  N/A 05/04/2017   Procedure: ESOPHAGOGASTRODUODENOSCOPY (EGD) WITH PROPOFOL ;  Surgeon: Toledo, Ladell POUR, MD;  Location: [facility] ENDOSCOPY;  Service: Gastroenterology;  Laterality: N/A;   FINGER DEBRIDEMENT  1976    s/p infected dog bite   TEE WITHOUT CARDIOVERSION N/A 05/20/2012   Procedure: TRANSESOPHAGEAL ECHOCARDIOGRAM (TEE);  Surgeon: Vina LULLA Gull, MD;  Location: [facility] ENDOSCOPY;  Service: Cardiovascular;  Laterality: N/A;   Social History   reports that he has been smoking cigarettes. He has never used smokeless tobacco. He reports current alcohol use of about 9.0 standard drinks of alcohol per week. He reports current drug use. Drugs: Marijuana and Cocaine.   Family History   His family history is not on file.   Allergies Allergies  Allergen Reactions   Bee Venom Anaphylaxis   Bovine (Beef) Protein Other (See Comments)    Personal preference   Bovine (Beef) Protein-Containing Drug Products     Personal preference   Lactalbumin    Milk-Related Compounds Nausea And Vomiting    Intoloerance   Pegademase Bovine     Other reaction(s): Unknown Personal preference Personal preference Personal preference    Tilactase Nausea And Vomiting    Intoloerance   Milk (Cow) Nausea And Vomiting    Intoloerance    Home Medications  Prior to Admission medications   Medication Sig Start Date End Date Taking? Authorizing Provider  albuterol  (PROVENTIL  HFA;VENTOLIN  HFA) 108 (90 Base) MCG/ACT inhaler Inhale 2 puffs into the lungs daily as needed for wheezing or shortness of breath.   Yes [provider]  AMBULATORY NON FORMULARY MEDICATION Trimix (30/1/10)-(Pap/Phent/PGE)  Test Dose  1ml vial   Qty #3 Refills 0  [facility] [phone] Fax [phone] 01/14/23  Yes McGowan, Clotilda A, PA-C  HYDROcodone -acetaminophen  (NORCO/VICODIN) 5-325 MG tablet Take 1 tablet by mouth every 6 (six) hours as needed. 01/18/24  Yes [provider]  lisinopril-hydrochlorothiazide  (ZESTORETIC) 20-12.5 MG tablet Take 2 tablets by mouth daily. 01/14/24  Yes [provider]  traZODone (DESYREL) 50 MG tablet Take 50 mg by mouth at bedtime. 12/28/23 12/27/24 Yes [provider]   triamcinolone ointment (KENALOG) 0.1 % Apply topically.   Yes [provider]  acetaminophen  (TYLENOL ) 500 MG tablet Take by mouth. Patient not taking: Reported on 02/04/2024    [provider]  amLODipine (NORVASC) 5 MG tablet Take 5 mg by mouth daily.    [provider]  aspirin  EC 81 MG tablet Take 1 tablet (81 mg total) by mouth daily. Swallow whole. Patient not taking: Reported on 02/04/2024 09/21/22   Dorinda Drue DASEN, MD  escitalopram  (LEXAPRO ) 20 MG tablet Take 20 mg by mouth daily. Patient not taking: Reported on 02/04/2024    [provider]  folic acid  (FOLVITE ) 1 MG tablet Take 1 tablet (1 mg total) by mouth daily. Patient not taking: Reported on 02/04/2024 09/21/22   Dorinda Drue DASEN, MD  gabapentin  (NEURONTIN ) 100 MG capsule Take 100 mg by mouth 3 (three) times daily.    [provider]  oxyCODONE -acetaminophen  (PERCOCET/ROXICET) 5-325 MG tablet Take 1 tablet by mouth every 6 (six) hours as needed. Patient not taking: Reported on 02/04/2024 09/17/23   [provider]  pantoprazole  (PROTONIX ) 40 MG tablet Take 40 mg by mouth daily.    [provider]  predniSONE  (DELTASONE ) 50 MG tablet Take 1 tablet (50 mg total) by mouth daily with breakfast. Patient taking differently: Take 10 mg by mouth daily with breakfast. 01/05/24   Arlander Charleston, MD  sildenafil  (VIAGRA ) 100 MG tablet Take 1 tablet (100 mg total) by mouth daily as needed for erectile dysfunction. Patient not taking: Reported on 02/04/2024 06/24/22   Penne Knee, MD  Scheduled Meds:  amLODipine  10 mg Per Tube Daily   chlordiazePOXIDE  25 mg Per Tube Daily   Chlorhexidine Gluconate Cloth  6 each Topical Daily   cyanocobalamin  1,000 mcg Intramuscular Daily   folic acid   1 mg Oral Daily   fondaparinux (ARIXTRA) injection  2.5 mg Subcutaneous Q24H   free water  30 mL Per Tube Q4H   gabapentin   100 mg Per Tube TID   insulin aspart  0-15 Units Subcutaneous Q4H    ipratropium-albuterol   3 mL Nebulization TID   multivitamin with minerals  1 tablet Oral Daily   nicotine  21 mg Transdermal Daily   mouth rinse  15 mL Mouth Rinse Q2H   pantoprazole  (PROTONIX ) IV  40 mg Intravenous Q12H   thiamine  (VITAMIN B1) injection  100 mg Intravenous Q24H   Continuous Infusions:  cefTRIAXone (ROCEPHIN)  IV Stopped (02/09/24 2123)   feeding supplement ([place] [place] STANDARD ENT 1.4) 60 mL/hr at 02/10/24 0600   PRN Meds:.acetaminophen  **OR** acetaminophen , fentaNYL  (SUBLIMAZE ) injection, hydrALAZINE , ipratropium-albuterol , labetalol , midazolam  PF, nitroGLYCERIN , mouth rinse, polyethylene glycol  Active Hospital Problem list   See systems below  Assessment & Plan:   #Acute Metabolic Encephalopathy #EtOH Abuse with complicated withdrawal #Polysubstance Abuse #Mechanical intubation pain/discomfort  Hx: Polysubstance abuse, Anxiety, Depression 10/24: CT Head negative 10/24: UDS + cannabinoid, opiates 10/29 MRI Brain: small subtle foci of diffusion signal abnormality involving subcortical posterior left frontal parietal region likely evolving small subacute ischemic infarcts - not acute. Underlying moderate chronic microvascular ischemic disease with multiple remote lacunar infarcts about deep gray nuclei, pons and cerebellem - Correct metabolic derangements  - Continue folic acid , thiamine , and mvi  - CIWA protocol: continue scheduled librium taper and prn librium  - PAD protocol to maintain RASS goal of 0 to -1: versed  and fentanyl  prn - WUA daily; still unable to follow commands - Repeat ammonia level pending - EEG ordered - Neuro consult to evaluate follow MRI results and continued posturing   #Syncope likely secondary to substance abuse #Hypertension #Chest Pain 10/24 Chest CTA: negative for PE 10/25 ECHO: EF 60-65%, mild left ventricular hypertrophy; Grade I diastolic dysfunction - Continuous cardiac monitoring - Vasopressors to maintain MAP goal >65 ~  not currently requiring - Continue antihypertensives: amlodipine 10mg  per tube and prn labetalol /hydralazine  - US  Venous RUE to r/o VTE   #Acute Hypoxic Respiratory Failure secondary to Aspiration Pneumonia #Mechanical Ventilation requirement secondary to Acute Respiratory Failure Hx: ?COPD and current everyday smoker - Continue full mechanical support; on minimal settings, on pressure support this am - Continue lung protective strategies - Maintain plateau pressures less than 30 cm H2O  - VAP bundle implemented  - WUA and SBT once parameters met; mental status precluding extubation - Continue scheduled and prn bronchodilator therapy  - Intermittent CXR and ABG as needed   #Acute Kidney Injury~improving   - Trend BMP and monitor renal function - Strict I&O's - Avoid nephrotoxic agents  - Ensure renal perfusion - Electrolyte replacement protocol as indicated: pharmacy to assist with replacement   #Sepsis secondary to Suspected Aspiration Pneumonia - Trend WBC  and monitor fever curve - WBC improving and Tmax 102.6 - Follow cultures as available  - Respiratory Culture: Strep Pneumo, sensitivities to follow - Continue Ceftriaxone as outlined above pending culture results and sensitivities     #Alcoholic Liver Disease #Chronic Hepatitis #GERD without Esophagitis - Trend hepatic function panel  - Continue PPI: Protonix  40mg  - Dietician input for nutrition: Continue TF's    #Sickle Cell Anemia - Trend CBC - Monitor H/H, platelets and coags - Transfuse if Hgb < 7; Hgb baseline (10.8-11.5), currently 9.9 - Monitor for s/sx of bleeding - DVT prophylaxis: Fondaparinux 2.5 mg daily   #Type II Diabetes Mellitus  #Peripheral Neuropathy - Follow ICU hypo/hyperglycemia protocol - CBG Q4H - SSI as indicated - Target CBG readings:140-180   Critical care time: 55 minutes     Robet Kim, PA-C [facility] P[facility]M Team Contact Info:  [phone]

## 2024-02-10 NOTE — Progress Notes (Signed)
 Eeg done

## 2024-02-10 NOTE — Progress Notes (Signed)
 PHARMACY CONSULT NOTE - ELECTROLYTES  Pharmacy Consult for Electrolyte Monitoring and Replacement   Recent Labs: Height: 5' 8 (172.7 cm) Weight: 86 kg (189 lb 9.5 oz) IBW/kg (Calculated) : 68.4 Estimated Creatinine Clearance: 68.3 mL/min (by C-G formula based on SCr of 1.15 mg/dL). Potassium (mmol/L)  Date Value  02/10/2024 4.6  07/17/2014 4.0   Magnesium  (mg/dL)  Date Value  89/69/7974 2.4   Calcium  (mg/dL)  Date Value  89/69/7974 8.6 (L)   Calcium , Total (mg/dL)  Date Value  95/94/7983 9.3   Albumin (g/dL)  Date Value  89/72/7974 3.5  04/01/2014 3.5   Phosphorus (mg/dL)  Date Value  89/69/7974 3.3   Sodium (mmol/L)  Date Value  02/10/2024 142  07/17/2014 140   Assessment  Edward Weber is a 63 y.o. male presenting with syncope. PMH significant for HTN, CVA, sickle cell anemia, GERD, polysubstance abuse. Pharmacy has been consulted to monitor and replace electrolytes.  Diet: NPO- OG tube  - Feeding supplement - [name] Standard ENT 1.4 @ 60 mL/hr  MIVF: n/a  Pertinent medications: n/a  Goal of Therapy: Electrolytes WNL  Plan:  No electrolyte replacement indicated at this time.  Check BMP, Mg, Phos with AM labs  Thank you for allowing pharmacy to be a part of this patient's care.  Bernardino George, PharmD Candidate [phone] [facility] [employer] 02/10/2024 7:22 AM

## 2024-02-10 NOTE — Consult Note (Signed)
 Reason for Consult:AMS Requesting Physician: Assaker  CC: AMS  I have been asked by Dr. Malka to see this patient in consultation for AMS.  HPI: Edward Weber is an 63 y.o. male with past medical history of hypertension, stroke, history of sickle cell anemia, GERD polysubstance abuse who presented to the hospital on 10/24 with complaints of dizziness and passing out spell while he was trying to walk to his bathroom. He also had some shortness of breath and exertional chest pain and dyspnea on exertion for previous few days. BP was elevated.  While in the ED became combative/confused and required IVC.  Felt to be secondary to ETOH withdrawal.  Transferred to ICU on 10/26 due to decreased level of consciousness and increased work of breathing and required intubation/sedation.  Sedation now decreased and patient remains poorly responsive.  Patient has episodes of stiffening and shaking as well.  ? Posturing.    Past Medical History:  Diagnosis Date   Alcoholism (HCC)    Allergic rhinitis    Allergic state    Anemia    Arthritis    Cancer (HCC)    Chronic hepatitis C (HCC)    Depression    Esophagitis, reflux    GERD (gastroesophageal reflux disease)    Gout    Helicobacter pylori gastritis    Hepatitis    Hypertension    Neuromuscular disorder (HCC)    Osteoarthrosis    Peripheral neuropathy    Rhabdomyolysis    Sickle cell anemia (HCC)    Stroke (HCC)    Stroke [facility])    Vitiligo     Past Surgical History:  Procedure Laterality Date   COLON SURGERY     COLONOSCOPY WITH PROPOFOL  N/A 05/09/2015   Procedure: COLONOSCOPY WITH PROPOFOL ;  Surgeon: Deward CINDERELLA Piedmont, MD;  Location: [facility] ENDOSCOPY;  Service: Gastroenterology;  Laterality: N/A;   ESOPHAGOGASTRODUODENOSCOPY N/A 05/09/2015   Procedure: ESOPHAGOGASTRODUODENOSCOPY (EGD);  Surgeon: Deward CINDERELLA Piedmont, MD;  Location: [facility] ENDOSCOPY;  Service: Gastroenterology;  Laterality: N/A;   ESOPHAGOGASTRODUODENOSCOPY (EGD) WITH PROPOFOL  N/A 05/04/2017    Procedure: ESOPHAGOGASTRODUODENOSCOPY (EGD) WITH PROPOFOL ;  Surgeon: Toledo, Ladell POUR, MD;  Location: [facility] ENDOSCOPY;  Service: Gastroenterology;  Laterality: N/A;   FINGER DEBRIDEMENT  1976   s/p infected dog bite   TEE WITHOUT CARDIOVERSION N/A 05/20/2012   Procedure: TRANSESOPHAGEAL ECHOCARDIOGRAM (TEE);  Surgeon: Vina LULLA Gull, MD;  Location: [facility] ENDOSCOPY;  Service: Cardiovascular;  Laterality: N/A;    History reviewed. No pertinent family history.  Social History:  reports that he has been smoking cigarettes. He has never used smokeless tobacco. He reports current alcohol use of about 9.0 standard drinks of alcohol per week. He reports current drug use. Drugs: Marijuana and Cocaine.  Allergies  Allergen Reactions   Bee Venom Anaphylaxis   Bovine (Beef) Protein Other (See Comments)    Personal preference   Bovine (Beef) Protein-Containing Drug Products     Personal preference   Lactalbumin    Milk-Related Compounds Nausea And Vomiting    Intoloerance   Pegademase Bovine     Other reaction(s): Unknown Personal preference Personal preference Personal preference    Tilactase Nausea And Vomiting    Intoloerance   Milk (Cow) Nausea And Vomiting    Intoloerance    Medications: Prior to Admission:  Medications Prior to Admission  Medication Sig Dispense Refill Last Dose/Taking   albuterol  (PROVENTIL  HFA;VENTOLIN  HFA) 108 (90 Base) MCG/ACT inhaler Inhale 2 puffs into the lungs daily as needed for wheezing or shortness  of breath.   Taking As Needed   AMBULATORY NON FORMULARY MEDICATION Trimix (30/1/10)-(Pap/Phent/PGE)  Test Dose  1ml vial   Qty #3 Refills 0  [facility] [phone] Fax [phone] 3 vial 0 Unknown   HYDROcodone -acetaminophen  (NORCO/VICODIN) 5-325 MG tablet Take 1 tablet by mouth every 6 (six) hours as needed.   Taking As Needed   lisinopril-hydrochlorothiazide  (ZESTORETIC) 20-12.5 MG tablet Take 2 tablets by mouth daily.   Unknown   traZODone  (DESYREL) 50 MG tablet Take 50 mg by mouth at bedtime.   Unknown   triamcinolone ointment (KENALOG) 0.1 % Apply topically.   Taking   amLODipine (NORVASC) 5 MG tablet Take 5 mg by mouth daily.      aspirin  EC 81 MG tablet Take 1 tablet (81 mg total) by mouth daily. Swallow whole. (Patient not taking: Reported on 02/04/2024) 30 tablet 12 Not Taking   folic acid  (FOLVITE ) 1 MG tablet Take 1 tablet (1 mg total) by mouth daily. (Patient not taking: Reported on 02/04/2024) 30 tablet 0 Not Taking   gabapentin  (NEURONTIN ) 100 MG capsule Take 100 mg by mouth 3 (three) times daily.      pantoprazole  (PROTONIX ) 40 MG tablet Take 40 mg by mouth daily.   Unknown   predniSONE  (DELTASONE ) 50 MG tablet Take 1 tablet (50 mg total) by mouth daily with breakfast. (Patient taking differently: Take 10 mg by mouth daily with breakfast.) 4 tablet 0 Unknown   Scheduled:  amLODipine  10 mg Per Tube Daily   Chlorhexidine Gluconate Cloth  6 each Topical Daily   cyanocobalamin  1,000 mcg Intramuscular Daily   folic acid   1 mg Oral Daily   fondaparinux (ARIXTRA) injection  2.5 mg Subcutaneous Q24H   free water  30 mL Per Tube Q4H   insulin aspart  0-15 Units Subcutaneous Q4H   ipratropium-albuterol   3 mL Nebulization TID   multivitamin with minerals  1 tablet Oral Daily   nicotine  21 mg Transdermal Daily   mouth rinse  15 mL Mouth Rinse Q2H   pantoprazole  (PROTONIX ) IV  40 mg Intravenous Q12H   thiamine  (VITAMIN B1) injection  100 mg Intravenous Q24H    ROS: Unable to obtain due to mental status  Physical Examination: Blood pressure (!) 145/86, pulse 100, temperature 99.7 F (37.6 C), temperature source Oral, resp. rate (!) 26, height 5' 8 (1.727 m), weight 86 kg, SpO2 96%.  Mental Status: Patient does not respond to verbal stimuli.  At times tries to localize to deep sternal rub with LUE.  Does not follow commands.  No attempts at verbalizations noted.  Cranial Nerves: II: patient does not respond  confrontation bilaterally III,IV,VI: Oculocephalic response present bilaterally.  V,VII: corneal reflex present bilaterally with patient providing resistance on eye opening VIII: patient does not respond to verbal stimuli XI: trapezius strength unable to test bilaterally XII: tongue strength unable to test Motor: Patient does not maintain extremities against gravity.  RUE swollen.  Withdrawal movement noted in the LUE and LLE.  No movement noted in the RLE Sensory: Withdraws to noxious stimuli on the left.   Plantars: absent bilaterally Cerebellar: Unable to perform    Laboratory Studies:   Basic Metabolic Panel: Recent Labs  Lab 02/05/24 0400 02/06/24 0353 02/07/24 0202 02/07/24 0449 02/08/24 0416 02/09/24 0443 02/10/24 0439  NA 139   < > 134* 136 138 142 142  K 3.6   < > 4.8 4.3 4.1 4.2 4.6  CL 104   < >  101 103 108 107 109  CO2 23   < > 22 22 21* 21* 24  GLUCOSE 130*   < > 147* 144* 187* 174* 248*  BUN 7*   < > 18 18 23  25* 32*  CREATININE 1.02   < > 1.65* 1.60* 1.35* 1.06 1.15  CALCIUM  8.9   < > 8.5* 8.1* 7.7* 8.3* 8.6*  MG 1.8  --   --   --  2.2 2.2 2.4  PHOS 3.1  --   --   --  3.4 2.8 3.3   < > = values in this interval not displayed.    Liver Function Tests: Recent Labs  Lab 02/04/24 0242 02/07/24 0202  AST 28 22  ALT 22 17  ALKPHOS 44 36*  BILITOT 0.7 1.0  PROT 6.9 7.4  ALBUMIN 4.0 3.5   No results for input(s): LIPASE, AMYLASE in the last 168 hours. Recent Labs  Lab 02/07/24 0907 02/10/24 0957  AMMONIA 19 <13    CBC: Recent Labs  Lab 02/04/24 0242 02/05/24 0400 02/07/24 0449 02/08/24 0416 02/09/24 0443 02/10/24 0439  WBC 7.3 8.8 14.0* 15.2* 13.0* 12.4*  NEUTROABS 5.5  --   --   --   --   --   HGB 11.5* 11.6* 11.5* 10.6* 10.0* 9.9*  HCT 34.9* 34.1* 35.6* 32.9* 30.9* 30.8*  MCV 94.3 92.9 95.7 96.8 96.9 96.9  PLT 166 169 204 191 226 268    Cardiac Enzymes: No results for input(s): CKTOTAL, CKMB, CKMBINDEX, TROPONINI in  the last 168 hours.  BNP: Invalid input(s): POCBNP  CBG: Recent Labs  Lab 02/09/24 1625 02/09/24 1951 02/09/24 2325 02/10/24 0352 02/10/24 0735  GLUCAP 184* 182* 154* 176* 199*    Microbiology: Results for orders placed or performed during the hospital encounter of 02/04/24  Resp panel by RT-PCR (RSV, Flu A&B, Covid) Anterior Nasal Swab     Status: None   Collection Time: 02/04/24  2:55 AM   Specimen: Anterior Nasal Swab  Result Value Ref Range Status   SARS Coronavirus 2 by RT PCR NEGATIVE NEGATIVE Final    Comment: (NOTE) SARS-CoV-2 target nucleic acids are NOT DETECTED.  The SARS-CoV-2 RNA is generally detectable in upper respiratory specimens during the acute phase of infection. The lowest concentration of SARS-CoV-2 viral copies this assay can detect is 138 copies/mL. A negative result does not preclude SARS-Cov-2 infection and should not be used as the sole basis for treatment or other patient management decisions. A negative result may occur with  improper specimen collection/handling, submission of specimen other than nasopharyngeal swab, presence of viral mutation(s) within the areas targeted by this assay, and inadequate number of viral copies(<138 copies/mL). A negative result must be combined with clinical observations, patient history, and epidemiological information. The expected result is Negative.  Fact Sheet for Patients:  bloggercourse.com  Fact Sheet for Healthcare Providers:  seriousbroker.it  This test is no t yet approved or cleared by the [place]  FDA and  has been authorized for detection and/or diagnosis of SARS-CoV-2 by FDA under an Emergency Use Authorization (EUA). This EUA will remain  in effect (meaning this test can be used) for the duration of the COVID-19 declaration under Section 564(b)(1) of the Act, 21 U.S.C.section 360bbb-3(b)(1), unless the authorization is terminated  or  revoked sooner.       Influenza A by PCR NEGATIVE NEGATIVE Final   Influenza B by PCR NEGATIVE NEGATIVE Final    Comment: (NOTE) The Xpert Xpress SARS-CoV-2/FLU/RSV plus  assay is intended as an aid in the diagnosis of influenza from Nasopharyngeal swab specimens and should not be used as a sole basis for treatment. Nasal washings and aspirates are unacceptable for Xpert Xpress SARS-CoV-2/FLU/RSV testing.  Fact Sheet for Patients: bloggercourse.com  Fact Sheet for Healthcare Providers: seriousbroker.it  This test is not yet approved or cleared by the [place]  FDA and has been authorized for detection and/or diagnosis of SARS-CoV-2 by FDA under an Emergency Use Authorization (EUA). This EUA will remain in effect (meaning this test can be used) for the duration of the COVID-19 declaration under Section 564(b)(1) of the Act, 21 U.S.C. section 360bbb-3(b)(1), unless the authorization is terminated or revoked.     Resp Syncytial Virus by PCR NEGATIVE NEGATIVE Final    Comment: (NOTE) Fact Sheet for Patients: bloggercourse.com  Fact Sheet for Healthcare Providers: seriousbroker.it  This test is not yet approved or cleared by the [place]  FDA and has been authorized for detection and/or diagnosis of SARS-CoV-2 by FDA under an Emergency Use Authorization (EUA). This EUA will remain in effect (meaning this test can be used) for the duration of the COVID-19 declaration under Section 564(b)(1) of the Act, 21 U.S.C. section 360bbb-3(b)(1), unless the authorization is terminated or revoked.  Performed at [facility], [address] [place], [place] [address]   Respiratory (~20 pathogens) panel by PCR     Status: None   Collection Time: 02/07/24  4:49 AM   Specimen: Nasopharyngeal Swab; Respiratory  Result Value Ref Range Status   Adenovirus NOT DETECTED NOT  DETECTED Final   Coronavirus 229E NOT DETECTED NOT DETECTED Final    Comment: (NOTE) The Coronavirus on the Respiratory Panel, DOES NOT test for the novel  Coronavirus (2019 nCoV)    Coronavirus HKU1 NOT DETECTED NOT DETECTED Final   Coronavirus NL63 NOT DETECTED NOT DETECTED Final   Coronavirus OC43 NOT DETECTED NOT DETECTED Final   Metapneumovirus NOT DETECTED NOT DETECTED Final   Rhinovirus / Enterovirus NOT DETECTED NOT DETECTED Final   Influenza A NOT DETECTED NOT DETECTED Final   Influenza B NOT DETECTED NOT DETECTED Final   Parainfluenza Virus 1 NOT DETECTED NOT DETECTED Final   Parainfluenza Virus 2 NOT DETECTED NOT DETECTED Final   Parainfluenza Virus 3 NOT DETECTED NOT DETECTED Final   Parainfluenza Virus 4 NOT DETECTED NOT DETECTED Final   Respiratory Syncytial Virus NOT DETECTED NOT DETECTED Final   Bordetella pertussis NOT DETECTED NOT DETECTED Final   Bordetella Parapertussis NOT DETECTED NOT DETECTED Final   Chlamydophila pneumoniae NOT DETECTED NOT DETECTED Final   Mycoplasma pneumoniae NOT DETECTED NOT DETECTED Final    Comment: Performed at [facility], [address] [place], [place] [address]  MRSA Next Gen by PCR, Nasal     Status: None   Collection Time: 02/07/24  4:49 AM   Specimen: Nasal Mucosa; Nasal Swab  Result Value Ref Range Status   MRSA by PCR Next Gen NOT DETECTED NOT DETECTED Final    Comment: (NOTE) The GeneXpert MRSA Assay (FDA approved for NASAL specimens only), is one component of a comprehensive MRSA colonization surveillance program. It is not intended to diagnose MRSA infection nor to guide or monitor treatment for MRSA infections. Test performance is not FDA approved in patients less than 52 years old. Performed at [facility], [address] [place], [place] [address]   Culture, blood (Routine X 2) w Reflex to ID Panel     Status: None (Preliminary  result)   Collection Time: 02/07/24  6:11 AM   Specimen: BLOOD   Result Value Ref Range Status   Specimen Description BLOOD BLOOD RIGHT ARM  Final   Special Requests   Final    BOTTLES DRAWN AEROBIC AND ANAEROBIC Blood Culture results may not be optimal due to an inadequate volume of blood received in culture bottles   Culture   Final    NO GROWTH 3 DAYS Performed at [facility], [address] [place], [place] [address]    Report Status PENDING  Incomplete  Culture, blood (Routine X 2) w Reflex to ID Panel     Status: None (Preliminary result)   Collection Time: 02/07/24  6:18 AM   Specimen: BLOOD  Result Value Ref Range Status   Specimen Description BLOOD BLOOD LEFT ARM  Final   Special Requests   Final    BOTTLES DRAWN AEROBIC AND ANAEROBIC Blood Culture adequate volume   Culture   Final    NO GROWTH 3 DAYS Performed at [facility], [address] [place], [place] [address]    Report Status PENDING  Incomplete  Culture, Respiratory w Gram Stain     Status: None   Collection Time: 02/07/24 10:48 AM   Specimen: Tracheal Aspirate; Respiratory  Result Value Ref Range Status   Specimen Description   Final    TRACHEAL ASPIRATE Performed at [facility], [address] [place], [place] [address]    Special Requests   Final    NONE Performed at [facility], [address] [place], [place] [address]    Gram Stain   Final    FEW WBC PRESENT, PREDOMINANTLY PMN RARE GRAM POSITIVE COCCI Performed at [facility], [address] [place], [place] [address]    Culture RARE STREPTOCOCCUS PNEUMONIAE  Final   Report Status 02/10/2024 FINAL  Final   Organism ID, Bacteria STREPTOCOCCUS PNEUMONIAE  Final      Susceptibility   Streptococcus pneumoniae - MIC*    ERYTHROMYCIN >=8 RESISTANT Resistant     LEVOFLOXACIN  0.5 SENSITIVE Sensitive     VANCOMYCIN 0.5 SENSITIVE Sensitive     PENICILLIN (meningitis) 1 RESISTANT Resistant     PENO - penicillin 1      PENICILLIN (non-meningitis) 1 SENSITIVE  Sensitive     PENICILLIN (oral) 1 INTERMEDIATE Intermediate     CEFTRIAXONE (non-meningitis) 1 SENSITIVE Sensitive     CEFTRIAXONE (meningitis) 1 INTERMEDIATE Intermediate     * RARE STREPTOCOCCUS PNEUMONIAE    Coagulation Studies: No results for input(s): LABPROT, INR in the last 72 hours.  Urinalysis:  Recent Labs  Lab 02/04/24 1516  COLORURINE STRAW*  LABSPEC 1.021  PHURINE 7.0  GLUCOSEU NEGATIVE  HGBUR NEGATIVE  BILIRUBINUR NEGATIVE  KETONESUR NEGATIVE  PROTEINUR 100*  NITRITE NEGATIVE  LEUKOCYTESUR NEGATIVE    Lipid Panel:     Component Value Date/Time   CHOL 178 05/18/2012 1008   TRIG 206 (H) 02/07/2024 0202   HDL 20 (L) 05/18/2012 1008   CHOLHDL 8.9 05/18/2012 1008   VLDL 71 (H) 05/18/2012 1008   LDLCALC 87 05/18/2012 1008    HgbA1C:  Lab Results  Component Value Date   HGBA1C 8.1 (H) 02/04/2024    Urine Drug Screen:      Component Value Date/Time   LABOPIA POSITIVE (A) 02/04/2024 1415   COCAINSCRNUR NONE DETECTED 02/04/2024 1415   LABBENZ NONE DETECTED 02/04/2024 1415   AMPHETMU NONE DETECTED 02/04/2024 1415   THCU POSITIVE (  A) 02/04/2024 1415   LABBARB NONE DETECTED 02/04/2024 1415    Alcohol Level: No results for input(s): ETH in the last 168 hours.   Imaging: DG Abd 1 View Result Date: 02/10/2024 EXAM: 1 VIEW XRAY OF THE ABDOMEN 02/10/2024 01:38:22 AM COMPARISON: None available. CLINICAL HISTORY: Encounter for imaging study to confirm orogastric (OG) tube placement [id number]. NG tube placement. FINDINGS: LINES, TUBES AND DEVICES: Enteric tube courses below the hemidiaphragm with the tip overlying the expected region of the pyloric region/first portion of the duodenum. BOWEL: Nonobstructive bowel gas pattern. SOFT TISSUES: No opaque urinary calculi. BONES: No acute osseous abnormality. IMPRESSION: 1. Enteric tube tip overlies the region of the pylorus/first portion of the duodenum, appropriate for post-pyloric placement. Electronically signed  by: Morgane Naveau MD 02/10/2024 01:42 AM EDT RP Workstation: [id number]   MR BRAIN WO CONTRAST Result Date: 02/10/2024 EXAM: MRI BRAIN WITHOUT CONTRAST 02/09/2024 01:59:31 PM TECHNIQUE: Multiplanar multisequence MRI of the head/brain was performed without the administration of intravenous contrast. COMPARISON: Comparison made with prior CT from 02/04/2024. CLINICAL HISTORY: Mental status change, unknown cause. Pt on vent.63 y.o male with significant PMH of HTN, CVA, sickle cell anemia, GERD, polysubstance abuse (EtOH abuse disorder, cocaine, marijuana tobacco abuse), and recurrent syncope who presented to the ED with chief complaints of syncopal episode. ; Per chart review, he has been having exertional chest pain, DOP for the past few days and today felt dizzy and passed out without hitting his head. FINDINGS: BRAIN AND VENTRICLES: Patchy T2/FLAIR hypertensity involving the periventricular and deep white matter of both cerebral hemispheres, consistent with chronic small vessel ischemic disease, moderate in nature. Patchy involvement of the pons noted. Multiple superimposed remote lacunar infarcts are present about the bilateral basal ganglia, thalami, and pons. Small remote right cerebellar infarct. A prominent remote infarct involving the right basal ganglia/external capsule is noted with associated chronic hemosiderin staining (series 9, image 36). There are a few subtle subcentimeter foci of mild diffusion signal abnormality involving the subcortical aspect of the posterior left frontal parietal region (series 5, images 38, 34). Findings likely reflect evolving subacute ischemic infarcts; these are non-acute/recent in appearance. No associated acute hemorrhage or mass effect with these evolving infarcts. Few additional punctate chronic microhemorrhages are noted within the left cerebral hemisphere, likely small vessel related. No acute intracranial hemorrhage. No mass. No midline shift. No hydrocephalus.  The sella is unremarkable. Normal flow voids. ORBITS: No acute abnormality. SINUSES AND MASTOIDS: Mild scattered mucosal thickening is present about the ethmoid air cells. BONES AND SOFT TISSUES: Normal marrow signal. Postoperative changes are noted within the visualized upper cervical spine. Patient is intubated. No acute soft tissue abnormality. IMPRESSION: 1. Small subtle foci of diffusion signal abnormality involving the subcortical posterior left frontal parietal region likely evolving small subacute ischemic infarcts. These are not acute/recent in appearance. 2. No other acute intracranial abnormality. 3. Underlying moderate chronic microvascular ischemic disease with multiple remote lacunar infarcts about the deep gray nuclei, pons, and cerebellum. Electronically signed by: Morene Hoard MD 02/10/2024 12:36 AM EDT RP Workstation: [id number]   US  Venous Img Lower Unilateral Right (DVT) Result Date: 02/09/2024 CLINICAL DATA:  Edema EXAM: RIGHT LOWER EXTREMITY VENOUS DOPPLER ULTRASOUND TECHNIQUE: Gray-scale sonography with compression, as well as color and duplex ultrasound, were performed to evaluate the deep venous system(s) from the level of the common femoral vein through the popliteal and proximal calf veins. COMPARISON:  None available FINDINGS: VENOUS Normal compressibility of the common femoral, superficial femoral, and popliteal veins, as  well as the visualized calf veins. Visualized portions of profunda femoral vein and great saphenous vein unremarkable. No filling defects to suggest DVT on grayscale or color Doppler imaging. Doppler waveforms show normal direction of venous flow, normal respiratory plasticity and response to augmentation. Limited views of the contralateral common femoral vein are unremarkable. OTHER None. Limitations: none IMPRESSION: No right lower extremity DVT. Electronically Signed   By: Aliene Lloyd M.D.   On: 02/09/2024 12:26     Assessment/Plan: 63 y.o. male with  past medical history of hypertension, stroke, history of sickle cell anemia though not confirmed with blood testing, GERD polysubstance abuse who presented to the hospital on 10/24 with complaints of dizziness, syncope, SOB and exertional chest pain.  Patient then became confused/combative and was felt to be experiencing withdrawal symptoms.  Placed on IVC.  Patient further decompensated requiring sedation and intubation.  Patient has episodes of stiffening and shaking as well.  ? Posturing.  Consult called for further recommendations. MRI of the brain personally reviewed and reveals subacute infarcts in the left fronto-parietal region as well as what appears to be a punctate left frontal infarct that seems more acute. All of these are quite small and would not be expected to cause the current neurological examination.  Based on imaging though etiology does appear embolic.  Patient has had some fevers and elevated wbc count.  Possible aspiration PNA although not clear.  Multiple metabolic issues are present as well.   Mental status likely multifactorial but would like to specifically rule out subclinical status epilepticus and possibly meningitis/encephalitis In stroke work up, echocardiogram was performed on 10/25 and shows no cardiac source of emboli with EF of 60-65%.   A1c 8.1   Recommendations: Fasting lipid panel.  Goal LDL<70.  Once able to start po would start high intensity statin Blood sugar management PT consult, OT consult, Speech consult once more alert Carotid dopplers ASA 300mg  rectally, daily EEG.  After review will consider need for Ceribell If no clear source of infection noted would consider LP Goal BP<140/80 Based on Hgb fractionation cascade it does not appear that the patient has sickle cell disease so involvement of hematology not necessary.    Sonny Hock, MD Neurology  02/10/2024, 11:05 AM

## 2024-02-10 NOTE — Consult Note (Signed)
 PHARMACY - ANTICOAGULATION CONSULT NOTE  Pharmacy Consult for heparin  Indication: VTE treatment  Allergies  Allergen Reactions   Bee Venom Anaphylaxis   Bovine (Beef) Protein Other (See Comments)    Personal preference   Bovine (Beef) Protein-Containing Drug Products     Personal preference   Lactalbumin    Milk-Related Compounds Nausea And Vomiting    Intoloerance   Pegademase Bovine     Other reaction(s): Unknown Personal preference Personal preference Personal preference    Tilactase Nausea And Vomiting    Intoloerance   Milk (Cow) Nausea And Vomiting    Intoloerance    Patient Measurements: Height: 5' 8 (172.7 cm) Weight: 86 kg (189 lb 9.5 oz) IBW/kg (Calculated) : 68.4 HEPARIN  DW (KG): 87.4  Weight Used: 87.4 kg  Vital Signs: Temp: 99.4 F (37.4 C) (10/30 2000) Temp Source: Oral (10/30 2000) BP: 161/95 (10/30 2000) Pulse Rate: 92 (10/30 2000)  Labs: Recent Labs    02/08/24 0416 02/09/24 0443 02/10/24 0439 02/10/24 1941  HGB 10.6* 10.0* 9.9*  --   HCT 32.9* 30.9* 30.8*  --   PLT 191 226 268  --   APTT  --   --   --  79*  HEPARINUNFRC  --   --   --  0.75*  CREATININE 1.35* 1.06 1.15  --     Estimated Creatinine Clearance: 68.3 mL/min (by C-G formula based on SCr of 1.15 mg/dL).   Medical History: Past Medical History:  Diagnosis Date   Alcoholism (HCC)    Allergic rhinitis    Allergic state    Anemia    Arthritis    Cancer (HCC)    Chronic hepatitis C (HCC)    Depression    Esophagitis, reflux    GERD (gastroesophageal reflux disease)    Gout    Helicobacter pylori gastritis    Hepatitis    Hypertension    Neuromuscular disorder (HCC)    Osteoarthrosis    Peripheral neuropathy    Rhabdomyolysis    Sickle cell anemia (HCC)    Stroke (HCC)    Stroke (HCC)    Vitiligo     Medications:  Scheduled:   amLODipine  10 mg Per Tube Daily   B-complex with vitamin C  1 tablet Per Tube Daily   Chlorhexidine Gluconate Cloth  6 each  Topical Daily   cyanocobalamin  1,000 mcg Intramuscular Daily   folic acid   1 mg Oral Daily   free water  30 mL Per Tube Q4H   insulin aspart  0-15 Units Subcutaneous Q4H   ipratropium-albuterol   3 mL Nebulization TID   labetalol   50 mg Per Tube BID   multivitamin with minerals  1 tablet Oral Daily   nicotine  21 mg Transdermal Daily   mouth rinse  15 mL Mouth Rinse Q2H   pantoprazole  (PROTONIX ) IV  40 mg Intravenous Q12H   [START ON 02/14/2024] thiamine  (VITAMIN B1) injection  100 mg Intravenous Q24H   Infusions:   feeding supplement (KATE FARMS STANDARD ENT 1.4) 60 mL/hr at 02/10/24 1400   heparin  1,400 Units/hr (02/10/24 1404)   [START ON 02/11/2024] thiamine  (VITAMIN B1) injection     PRN: acetaminophen  **OR** acetaminophen , fentaNYL  (SUBLIMAZE ) injection, hydrALAZINE , ipratropium-albuterol , labetalol , midazolam  PF, mouth rinse, polyethylene glycol Anti-infectives (From admission, onward)    Start     Dose/Rate Route Frequency Ordered Stop   02/06/24 2000  cefTRIAXone (ROCEPHIN) 2 g in sodium chloride  0.9 % 100 mL IVPB  2 g 200 mL/hr over 30 Minutes Intravenous Every 24 hours 02/06/24 1910 02/10/24 2054       Assessment: Edward Weber is a 63 y.o. male with PMH significant for  HTN, CVA, sickle cell anemia, GERD, polysubstance abuse. Presented with chief complaint of syncopal episode.   10/30 US  Venous RUE significant for occlusive thrombus in the right basilic and radial veins.   Patient had been receiving fondaparinux 2.5mg  subcutaneously every 24 hours for prophylaxis.  - Last dose 10/29 @ 1405  Goal of Therapy:  Heparin  level 0.3-0.7 units/ml  Monitor platelets by anticoagulation protocol: Yes  Plan: Pt not on DOAC PTA. Will use heparin  level to monitor heparin  as its the preferred. Heparin  level is slightly supratherapeutic. Will decrease the rate to 1350 units/hr. Recheck heparin  level in 6 hours. CBC daily while on heparin .   Cathaleen Blanch, PharmD   02/10/2024 8:59 PM

## 2024-02-10 NOTE — Progress Notes (Signed)
 Nutrition Follow Up Note   DOCUMENTATION CODES:   Not applicable  INTERVENTION:   Continue Edward Weber RTH @60ml /hr  Free water flushes 30ml q4 hours to maintain tube patency   Regimen provides 2016kcal/day, 89g/day protein and 1202ml/day of free water.   Continue high dose IV thiamine  followed by 100mg  IV daily   Cyanocobalamin 1000mcg IM daily x 7 days   Add B-complex with C daily via tube   Daily weights   NUTRITION DIAGNOSIS:   Inadequate oral intake related to inability to eat (pt sedated and ventilated) as evidenced by NPO status. -ongoing   GOAL:   Provide needs based on ASPEN/SCCM guidelines -met   MONITOR:   Vent status, Labs, Weight trends, TF tolerance, I & O's, Skin  ASSESSMENT:   63 y/o male with h/o etoh and substance abuse, milk allergy, HTN, GERD, Schatzki's ring, hiatal hernia, CVA with residual hemiplegia, DM, neuropathy, prostate cancer, HCV, sickle cell anemia, ICH, depression, CKD III, gout, H. pylori and spinal stenosis who is admitted with AMS, syncope, aspiration PNA, sepsis, new sub-acute infarcts and AKI.  Pt remains sedated and ventilated. OGT in place. Pt is tolerating tube feeds well at goal rate. Pt with ongoing AMS; RD will add B-complex with C. Pt with B12 deficiency; pt is receiving supplementation. Pt is also receiving high dose IV thiamine . Refeed labs stable. Pt is having bowel function. Per chart, pt is up ~9lbs since admission but appears to be at his UBW currently. Pt +3.6L on his I & Os.    Medications reviewed and include: B12, folic acid , heparin , MVI, nicotine, protonix , thiamine , ceftriaxone  Labs reviewed: K 4.6 wnl, BUN 32(H), P 3.3 wnl, Mg 2.4 wnl Ammonia- <13 B12- <150(L)- 10/28 wbc- 12.4(H), Hgb 9.9(L), Hct 30.8(L) Cbgs- 157, 199, 176 x 24 hrs  Patient is currently intubated on ventilator support MV: 10 L/min Temp (24hrs), Avg:100.1 F (37.8 C), Min:98.6 F (37 C), Max:101 F (38.3 C)  MAP >76mmHg   UOP-    Diet Order:   Diet Order             Diet NPO time specified Except for: Other (See Comments)  Diet effective now                  EDUCATION NEEDS:   No education needs have been identified at this time  Skin:  Skin Assessment: Reviewed RN Assessment  Last BM:  10/30-  Height:   Ht Readings from Last 1 Encounters:  02/04/24 5' 8 (1.727 m)    Weight:   Wt Readings from Last 1 Encounters:  02/09/24 86 kg    Ideal Body Weight:  70 kg  BMI:  Body mass index is 28.83 kg/m.  Estimated Nutritional Needs:   Kcal:  2053kcal/day  Protein:  105-120g/day  Fluid:  1.8-2.1L/day  Edward Shams MS, RD, LDN If unable to be reached, please send secure chat to RD inpatient available from 8:00a-4:00p daily

## 2024-02-10 NOTE — Procedures (Addendum)
 Patient Name: Edward Weber  MRN: [id number]  Epilepsy Attending: Arlin MALVA Krebs  Referring Physician/Provider: Bousman, Karlie, PA-C  Date: 02/10/2024 Duration: 30.30 mins  Patient history: 63 year old male patient with a past medical history of hypertension, CVA, sickle cell anemia, GERD, polysubstance abuse including EtOH cocaine and marijuana presented to the ED for syncopal episode. EEG to evaluate for seizure  Level of alertness: asleep, awake/ lethargic  AEDs during EEG study: Valium  Technical aspects: This EEG study was done with scalp electrodes positioned according to the 10-20 International system of electrode placement. Electrical activity was reviewed with band pass filter of 1-70Hz , sensitivity of 7 uV/mm, display speed of 76mm/sec with a 60Hz  notched filter applied as appropriate. EEG data were recorded continuously and digitally stored.  Video monitoring was available and reviewed as appropriate.  Description: No clear posterior dominant rhythm was seen. Sleep was characterized by vertex waves, sleep spindles (12 to 14 Hz), maximal frontocentral region. There is continuous generalized 3 to 6 Hz theta-delta slowing. Hyperventilation and photic stimulation were not performed.     ABNORMALITY - Continuous slow, generalized  IMPRESSION: This study is suggestive of generalized cerebral dysfunction (encephalopathy). No seizures or epileptiform discharges were seen throughout the recording.    Edward Weber

## 2024-02-11 DIAGNOSIS — I6340 Cerebral infarction due to embolism of unspecified cerebral artery: Secondary | ICD-10-CM | POA: Diagnosis not present

## 2024-02-11 DIAGNOSIS — F10931 Alcohol use, unspecified with withdrawal delirium: Secondary | ICD-10-CM | POA: Diagnosis not present

## 2024-02-11 DIAGNOSIS — G9341 Metabolic encephalopathy: Secondary | ICD-10-CM | POA: Diagnosis not present

## 2024-02-11 DIAGNOSIS — I10 Essential (primary) hypertension: Secondary | ICD-10-CM | POA: Diagnosis not present

## 2024-02-11 DIAGNOSIS — R4182 Altered mental status, unspecified: Secondary | ICD-10-CM | POA: Diagnosis not present

## 2024-02-11 DIAGNOSIS — J690 Pneumonitis due to inhalation of food and vomit: Secondary | ICD-10-CM | POA: Diagnosis not present

## 2024-02-11 DIAGNOSIS — R079 Chest pain, unspecified: Secondary | ICD-10-CM | POA: Diagnosis not present

## 2024-02-11 LAB — CBC
HCT: 30.1 % — ABNORMAL LOW (ref 39.0–52.0)
Hemoglobin: 9.7 g/dL — ABNORMAL LOW (ref 13.0–17.0)
MCH: 31 pg (ref 26.0–34.0)
MCHC: 32.2 g/dL (ref 30.0–36.0)
MCV: 96.2 fL (ref 80.0–100.0)
Platelets: 269 K/uL (ref 150–400)
RBC: 3.13 MIL/uL — ABNORMAL LOW (ref 4.22–5.81)
RDW: 15.3 % (ref 11.5–15.5)
WBC: 11.9 K/uL — ABNORMAL HIGH (ref 4.0–10.5)
nRBC: 0 % (ref 0.0–0.2)

## 2024-02-11 LAB — GLUCOSE, CAPILLARY
Glucose-Capillary: 180 mg/dL — ABNORMAL HIGH (ref 70–99)
Glucose-Capillary: 194 mg/dL — ABNORMAL HIGH (ref 70–99)
Glucose-Capillary: 197 mg/dL — ABNORMAL HIGH (ref 70–99)
Glucose-Capillary: 204 mg/dL — ABNORMAL HIGH (ref 70–99)
Glucose-Capillary: 215 mg/dL — ABNORMAL HIGH (ref 70–99)
Glucose-Capillary: 223 mg/dL — ABNORMAL HIGH (ref 70–99)

## 2024-02-11 LAB — BASIC METABOLIC PANEL WITH GFR
Anion gap: 11 (ref 5–15)
BUN: 35 mg/dL — ABNORMAL HIGH (ref 8–23)
CO2: 23 mmol/L (ref 22–32)
Calcium: 8.5 mg/dL — ABNORMAL LOW (ref 8.9–10.3)
Chloride: 109 mmol/L (ref 98–111)
Creatinine, Ser: 1.01 mg/dL (ref 0.61–1.24)
GFR, Estimated: >60 mL/min (ref >60)
Glucose, Bld: 230 mg/dL — ABNORMAL HIGH (ref 70–99)
Potassium: 4.3 mmol/L (ref 3.5–5.1)
Sodium: 143 mmol/L (ref 135–145)

## 2024-02-11 LAB — HEPARIN LEVEL (UNFRACTIONATED)
Heparin Unfractionated: 0.55 [IU]/mL (ref 0.30–0.70)
Heparin Unfractionated: 0.49 [IU]/mL (ref 0.30–0.70)

## 2024-02-11 LAB — PHOSPHORUS: Phosphorus: 2.9 mg/dL (ref 2.5–4.6)

## 2024-02-11 LAB — MAGNESIUM: Magnesium: 2.3 mg/dL (ref 1.7–2.4)

## 2024-02-11 MED ORDER — SODIUM CHLORIDE 0.9 % IV SOLN
2.0000 g | INTRAVENOUS | Status: AC
  Administered 2024-02-11 – 2024-02-15 (×5): 2 g via INTRAVENOUS
  Filled 2024-02-11 (×6): qty 20

## 2024-02-11 NOTE — Progress Notes (Signed)
 PHARMACY CONSULT NOTE - ELECTROLYTES  Pharmacy Consult for Electrolyte Monitoring and Replacement   Recent Labs: Height: 5' 8 (172.7 cm) Weight: 86 kg (189 lb 9.5 oz) IBW/kg (Calculated) : 68.4 Estimated Creatinine Clearance: 77.8 mL/min (by C-G formula based on SCr of 1.01 mg/dL). Potassium (mmol/L)  Date Value  02/11/2024 4.3  07/17/2014 4.0   Magnesium  (mg/dL)  Date Value  89/68/7974 2.3   Calcium  (mg/dL)  Date Value  89/68/7974 8.5 (L)   Calcium , Total (mg/dL)  Date Value  95/94/7983 9.3   Albumin (g/dL)  Date Value  89/72/7974 3.5  04/01/2014 3.5   Phosphorus (mg/dL)  Date Value  89/68/7974 2.9   Sodium (mmol/L)  Date Value  02/11/2024 143  07/17/2014 140   Assessment  Edward Weber is a 63 y.o. male presenting with syncope. PMH significant for HTN, CVA, sickle cell anemia, GERD, polysubstance abuse. Pharmacy has been consulted to monitor and replace electrolytes.  Diet: NPO- OG tube  - Feeding supplement - [name] Standard ENT 1.4 @ 60 mL/hr  MIVF: n/a  Pertinent medications: n/a  Goal of Therapy: Electrolytes WNL  Plan:  No electrolyte replacement indicated at this time.  Check BMP, Mg, Phos with AM labs  Thank you for allowing pharmacy to be a part of this patient's care.  Bernardino George, PharmD Candidate [phone] [facility] [employer] 02/11/2024 7:42 AM

## 2024-02-11 NOTE — Plan of Care (Signed)
  Problem: Clinical Measurements: Goal: Ability to maintain clinical measurements within normal limits will improve Outcome: Progressing Goal: Will remain free from infection Outcome: Progressing Goal: Diagnostic test results will improve Outcome: Progressing Goal: Respiratory complications will improve Outcome: Progressing Goal: Cardiovascular complication will be avoided Outcome: Progressing   Problem: Activity: Goal: Risk for activity intolerance will decrease Outcome: Progressing   Problem: Nutrition: Goal: Adequate nutrition will be maintained Outcome: Progressing   Problem: Pain Managment: Goal: General experience of comfort will improve and/or be controlled Outcome: Progressing

## 2024-02-11 NOTE — Consult Note (Signed)
 PHARMACY - ANTICOAGULATION CONSULT NOTE  Pharmacy Consult for heparin  Indication: VTE treatment  Allergies  Allergen Reactions   Bee Venom Anaphylaxis   Bovine (Beef) Protein Other (See Comments)    Personal preference   Bovine (Beef) Protein-Containing Drug Products     Personal preference   Lactalbumin    Milk-Related Compounds Nausea And Vomiting    Intoloerance   Pegademase Bovine     Other reaction(s): Unknown Personal preference Personal preference Personal preference    Tilactase Nausea And Vomiting    Intoloerance   Milk (Cow) Nausea And Vomiting    Intoloerance    Patient Measurements: Height: 5' 8 (172.7 cm) Weight: 86 kg (189 lb 9.5 oz) IBW/kg (Calculated) : 68.4 HEPARIN  DW (KG): 87.4  Weight Used: 87.4 kg  Vital Signs: Temp: 100.5 F (38.1 C) (10/31 0400) Temp Source: Axillary (10/31 0400) BP: 143/83 (10/31 0630) Pulse Rate: 107 (10/31 0630)  Labs: Recent Labs    02/09/24 0443 02/10/24 0439 02/10/24 1941 02/11/24 0614  HGB 10.0* 9.9*  --  9.7*  HCT 30.9* 30.8*  --  30.1*  PLT 226 268  --  269  APTT  --   --  79*  --   HEPARINUNFRC  --   --  0.75* 0.55  CREATININE 1.06 1.15  --   --     Estimated Creatinine Clearance: 68.3 mL/min (by C-G formula based on SCr of 1.15 mg/dL).   Medical History: Past Medical History:  Diagnosis Date   Alcoholism (HCC)    Allergic rhinitis    Allergic state    Anemia    Arthritis    Cancer (HCC)    Chronic hepatitis C (HCC)    Depression    Esophagitis, reflux    GERD (gastroesophageal reflux disease)    Gout    Helicobacter pylori gastritis    Hepatitis    Hypertension    Neuromuscular disorder (HCC)    Osteoarthrosis    Peripheral neuropathy    Rhabdomyolysis    Sickle cell anemia (HCC)    Stroke (HCC)    Stroke (HCC)    Vitiligo     Medications:  Scheduled:   amLODipine  10 mg Per Tube Daily   B-complex with vitamin C  1 tablet Per Tube Daily   Chlorhexidine Gluconate Cloth  6 each  Topical Daily   cyanocobalamin  1,000 mcg Intramuscular Daily   folic acid   1 mg Oral Daily   free water  30 mL Per Tube Q4H   insulin aspart  0-15 Units Subcutaneous Q4H   ipratropium-albuterol   3 mL Nebulization TID   labetalol   50 mg Per Tube BID   multivitamin with minerals  1 tablet Oral Daily   nicotine  21 mg Transdermal Daily   mouth rinse  15 mL Mouth Rinse Q2H   pantoprazole  (PROTONIX ) IV  40 mg Intravenous Q12H   [START ON 02/14/2024] thiamine  (VITAMIN B1) injection  100 mg Intravenous Q24H   Infusions:   feeding supplement (KATE FARMS STANDARD ENT 1.4) 60 mL/hr at 02/11/24 0600   heparin  1,350 Units/hr (02/11/24 0600)   thiamine  (VITAMIN B1) injection     PRN: acetaminophen  **OR** acetaminophen , fentaNYL  (SUBLIMAZE ) injection, hydrALAZINE , ipratropium-albuterol , labetalol , midazolam  PF, mouth rinse, polyethylene glycol Anti-infectives (From admission, onward)    Start     Dose/Rate Route Frequency Ordered Stop   02/06/24 2000  cefTRIAXone (ROCEPHIN) 2 g in sodium chloride  0.9 % 100 mL IVPB        2 g  200 mL/hr over 30 Minutes Intravenous Every 24 hours 02/06/24 1910 02/10/24 2054       Assessment: Edward Weber is a 63 y.o. male with PMH significant for  HTN, CVA, sickle cell anemia, GERD, polysubstance abuse. Presented with chief complaint of syncopal episode.   10/30 US  Venous RUE significant for occlusive thrombus in the right basilic and radial veins.   Patient had been receiving fondaparinux 2.5mg  subcutaneously every 24 hours for prophylaxis.  - Last dose 10/29 @ 1405  Goal of Therapy:  Heparin  level 0.3-0.7 units/ml  Monitor platelets by anticoagulation protocol: Yes  10/31 0614 HL 0.55, therapeutic x 1  Plan: Continue heparin  infusion rate at 1350 units/hr.  Recheck heparin  level in 6 hours to confirm. CBC daily while on heparin .   Rankin CANDIE Dills, PharmD, [facility] 02/11/2024 6:39 AM

## 2024-02-11 NOTE — Progress Notes (Signed)
 Subjective: Patient more alert today.  Has been having much less frequent posturing-like spells.    Objective: Current vital signs: BP (!) 159/77   Pulse (!) 106   Temp 99.1 F (37.3 C) (Axillary)   Resp (!) 26   Ht 5' 8 (1.727 m)   Wt 86 kg   SpO2 95%   BMI 28.83 kg/m  Vital signs in last 24 hours: Temp:  [99.1 F (37.3 C)-100.5 F (38.1 C)] 99.1 F (37.3 C) (10/31 1000) Pulse Rate:  [88-112] 106 (10/31 0941) Resp:  [18-30] 26 (10/31 0900) BP: (115-180)/(65-133) 159/77 (10/31 1100) SpO2:  [94 %-99 %] 95 % (10/31 0900) FiO2 (%):  [28 %] 28 % (10/31 1000)  Intake/Output from previous day: 10/30 0701 - 10/31 0700 In: 1949.5 [I.V.:270.5; NG/GT:1679] Out: 1940 [Urine:1740; Stool:200] Intake/Output this shift: Total I/O In: 30 [NG/GT:30] Out: 400 [Urine:400] Nutritional status:  Diet Order             Diet NPO time specified Except for: Other (See Comments)  Diet effective now                   Neurologic Exam: Mental Status: Eyes open.  Does not follow commands.  Grimaces to noxious stimuli Cranial Nerves: II: patient does not respond confrontation bilaterally III,IV,VI: Oculocephalic response present bilaterally.  V,VII: corneal reflex present bilaterally with patient providing resistance on eye opening XI: trapezius strength unable to test bilaterally XII: tongue strength unable to test Motor: RUE swollen.  Spontaneous movement noted in the lower extremities Sensory: Responds to noxious stimuli bilaterally  Lab Results: Basic Metabolic Panel: Recent Labs  Lab 02/05/24 0400 02/06/24 0353 02/07/24 0449 02/08/24 0416 02/09/24 0443 02/10/24 0439 02/11/24 0614  NA 139   < > 136 138 142 142 143  K 3.6   < > 4.3 4.1 4.2 4.6 4.3  CL 104   < > 103 108 107 109 109  CO2 23   < > 22 21* 21* 24 23  GLUCOSE 130*   < > 144* 187* 174* 248* 230*  BUN 7*   < > 18 23 25* 32* 35*  CREATININE 1.02   < > 1.60* 1.35* 1.06 1.15 1.01  CALCIUM  8.9   < > 8.1* 7.7*  8.3* 8.6* 8.5*  MG 1.8  --   --  2.2 2.2 2.4 2.3  PHOS 3.1  --   --  3.4 2.8 3.3 2.9   < > = values in this interval not displayed.    Liver Function Tests: Recent Labs  Lab 02/07/24 0202  AST 22  ALT 17  ALKPHOS 36*  BILITOT 1.0  PROT 7.4  ALBUMIN 3.5   No results for input(s): LIPASE, AMYLASE in the last 168 hours. Recent Labs  Lab 02/07/24 0907 02/10/24 0957  AMMONIA 19 <13    CBC: Recent Labs  Lab 02/07/24 0449 02/08/24 0416 02/09/24 0443 02/10/24 0439 02/11/24 0614  WBC 14.0* 15.2* 13.0* 12.4* 11.9*  HGB 11.5* 10.6* 10.0* 9.9* 9.7*  HCT 35.6* 32.9* 30.9* 30.8* 30.1*  MCV 95.7 96.8 96.9 96.9 96.2  PLT 204 191 226 268 269    Cardiac Enzymes: No results for input(s): CKTOTAL, CKMB, CKMBINDEX, TROPONINI in the last 168 hours.  Lipid Panel: Recent Labs  Lab 02/07/24 0202  TRIG 206*    CBG: Recent Labs  Lab 02/10/24 1622 02/10/24 2004 02/10/24 2321 02/11/24 0422 02/11/24 0726  GLUCAP 182* 184* 189* 204* 180*    Microbiology: Results for orders placed  or performed during the hospital encounter of 02/04/24  Resp panel by RT-PCR (RSV, Flu A&B, Covid) Anterior Nasal Swab     Status: None   Collection Time: 02/04/24  2:55 AM   Specimen: Anterior Nasal Swab  Result Value Ref Range Status   SARS Coronavirus 2 by RT PCR NEGATIVE NEGATIVE Final    Comment: (NOTE) SARS-CoV-2 target nucleic acids are NOT DETECTED.  The SARS-CoV-2 RNA is generally detectable in upper respiratory specimens during the acute phase of infection. The lowest concentration of SARS-CoV-2 viral copies this assay can detect is 138 copies/mL. A negative result does not preclude SARS-Cov-2 infection and should not be used as the sole basis for treatment or other patient management decisions. A negative result may occur with  improper specimen collection/handling, submission of specimen other than nasopharyngeal swab, presence of viral mutation(s) within the areas  targeted by this assay, and inadequate number of viral copies(<138 copies/mL). A negative result must be combined with clinical observations, patient history, and epidemiological information. The expected result is Negative.  Fact Sheet for Patients:  bloggercourse.com  Fact Sheet for Healthcare Providers:  seriousbroker.it  This test is no t yet approved or cleared by the [place]  FDA and  has been authorized for detection and/or diagnosis of SARS-CoV-2 by FDA under an Emergency Use Authorization (EUA). This EUA will remain  in effect (meaning this test can be used) for the duration of the COVID-19 declaration under Section 564(b)(1) of the Act, 21 U.S.C.section 360bbb-3(b)(1), unless the authorization is terminated  or revoked sooner.       Influenza A by PCR NEGATIVE NEGATIVE Final   Influenza B by PCR NEGATIVE NEGATIVE Final    Comment: (NOTE) The Xpert Xpress SARS-CoV-2/FLU/RSV plus assay is intended as an aid in the diagnosis of influenza from Nasopharyngeal swab specimens and should not be used as a sole basis for treatment. Nasal washings and aspirates are unacceptable for Xpert Xpress SARS-CoV-2/FLU/RSV testing.  Fact Sheet for Patients: bloggercourse.com  Fact Sheet for Healthcare Providers: seriousbroker.it  This test is not yet approved or cleared by the [place]  FDA and has been authorized for detection and/or diagnosis of SARS-CoV-2 by FDA under an Emergency Use Authorization (EUA). This EUA will remain in effect (meaning this test can be used) for the duration of the COVID-19 declaration under Section 564(b)(1) of the Act, 21 U.S.C. section 360bbb-3(b)(1), unless the authorization is terminated or revoked.     Resp Syncytial Virus by PCR NEGATIVE NEGATIVE Final    Comment: (NOTE) Fact Sheet for  Patients: bloggercourse.com  Fact Sheet for Healthcare Providers: seriousbroker.it  This test is not yet approved or cleared by the [place]  FDA and has been authorized for detection and/or diagnosis of SARS-CoV-2 by FDA under an Emergency Use Authorization (EUA). This EUA will remain in effect (meaning this test can be used) for the duration of the COVID-19 declaration under Section 564(b)(1) of the Act, 21 U.S.C. section 360bbb-3(b)(1), unless the authorization is terminated or revoked.  Performed at [facility], [address] [place], [place] [address]   Respiratory (~20 pathogens) panel by PCR     Status: None   Collection Time: 02/07/24  4:49 AM   Specimen: Nasopharyngeal Swab; Respiratory  Result Value Ref Range Status   Adenovirus NOT DETECTED NOT DETECTED Final   Coronavirus 229E NOT DETECTED NOT DETECTED Final    Comment: (NOTE) The Coronavirus on the Respiratory Panel, DOES NOT test for the novel  Coronavirus (2019 nCoV)  Coronavirus HKU1 NOT DETECTED NOT DETECTED Final   Coronavirus NL63 NOT DETECTED NOT DETECTED Final   Coronavirus OC43 NOT DETECTED NOT DETECTED Final   Metapneumovirus NOT DETECTED NOT DETECTED Final   Rhinovirus / Enterovirus NOT DETECTED NOT DETECTED Final   Influenza A NOT DETECTED NOT DETECTED Final   Influenza B NOT DETECTED NOT DETECTED Final   Parainfluenza Virus 1 NOT DETECTED NOT DETECTED Final   Parainfluenza Virus 2 NOT DETECTED NOT DETECTED Final   Parainfluenza Virus 3 NOT DETECTED NOT DETECTED Final   Parainfluenza Virus 4 NOT DETECTED NOT DETECTED Final   Respiratory Syncytial Virus NOT DETECTED NOT DETECTED Final   Bordetella pertussis NOT DETECTED NOT DETECTED Final   Bordetella Parapertussis NOT DETECTED NOT DETECTED Final   Chlamydophila pneumoniae NOT DETECTED NOT DETECTED Final   Mycoplasma pneumoniae NOT DETECTED NOT DETECTED Final    Comment: Performed at  [facility], [address], [place], [place] [address]  MRSA Next Gen by PCR, Nasal     Status: None   Collection Time: 02/07/24  4:49 AM   Specimen: Nasal Mucosa; Nasal Swab  Result Value Ref Range Status   MRSA by PCR Next Gen NOT DETECTED NOT DETECTED Final    Comment: (NOTE) The GeneXpert MRSA Assay (FDA approved for NASAL specimens only), is one component of a comprehensive MRSA colonization surveillance program. It is not intended to diagnose MRSA infection nor to guide or monitor treatment for MRSA infections. Test performance is not FDA approved in patients less than 30 years old. Performed at [facility], [address] [place], [place] [address]   Culture, blood (Routine X 2) w Reflex to ID Panel     Status: None (Preliminary result)   Collection Time: 02/07/24  6:11 AM   Specimen: BLOOD  Result Value Ref Range Status   Specimen Description BLOOD BLOOD RIGHT ARM  Final   Special Requests   Final    BOTTLES DRAWN AEROBIC AND ANAEROBIC Blood Culture results may not be optimal due to an inadequate volume of blood received in culture bottles   Culture   Final    NO GROWTH 4 DAYS Performed at [facility], [address], [place], [place] [address]    Report Status PENDING  Incomplete  Culture, blood (Routine X 2) w Reflex to ID Panel     Status: None (Preliminary result)   Collection Time: 02/07/24  6:18 AM   Specimen: BLOOD  Result Value Ref Range Status   Specimen Description BLOOD BLOOD LEFT ARM  Final   Special Requests   Final    BOTTLES DRAWN AEROBIC AND ANAEROBIC Blood Culture adequate volume   Culture   Final    NO GROWTH 4 DAYS Performed at [facility], [address] [place], [place] [address]    Report Status PENDING  Incomplete  Culture, Respiratory w Gram Stain     Status: None   Collection Time: 02/07/24 10:48 AM   Specimen: Tracheal Aspirate; Respiratory  Result Value Ref Range Status   Specimen Description    Final    TRACHEAL ASPIRATE Performed at [facility], [address] [place], [place] [address]    Special Requests   Final    NONE Performed at [facility], [address] [place], [place] [address]    Gram Stain   Final    FEW WBC PRESENT, PREDOMINANTLY PMN RARE GRAM POSITIVE COCCI Performed at [facility], [address], [place], [place] [address]    Culture RARE STREPTOCOCCUS PNEUMONIAE  Final   Report Status 02/10/2024 FINAL  Final   Organism ID, Bacteria STREPTOCOCCUS PNEUMONIAE  Final      Susceptibility   Streptococcus pneumoniae - MIC*    ERYTHROMYCIN >=8 RESISTANT Resistant     LEVOFLOXACIN  0.5 SENSITIVE Sensitive     VANCOMYCIN 0.5 SENSITIVE Sensitive     PENICILLIN (meningitis) 1 RESISTANT Resistant     PENO - penicillin 1      PENICILLIN (non-meningitis) 1 SENSITIVE Sensitive     PENICILLIN (oral) 1 INTERMEDIATE Intermediate     CEFTRIAXONE (non-meningitis) 1 SENSITIVE Sensitive     CEFTRIAXONE (meningitis) 1 INTERMEDIATE Intermediate     * RARE STREPTOCOCCUS PNEUMONIAE    Coagulation Studies: No results for input(s): LABPROT, INR in the last 72 hours.  Imaging: EEG adult Result Date: 02/10/2024 Shelton Arlin KIDD, MD     02/10/2024  1:55 PM Patient Name: Edward Weber MRN: [id number] Epilepsy Attending: Arlin KIDD Shelton Referring Physician/Provider: Terrace Space, PA-C Date: 02/10/2024 Duration: 30.30 mins Patient history: 63 year old male patient with a past medical history of hypertension, CVA, sickle cell anemia, GERD, polysubstance abuse including EtOH cocaine and marijuana presented to the ED for syncopal episode. EEG to evaluate for seizure Level of alertness: asleep, awake/ lethargic AEDs during EEG study: Valium Technical aspects: This EEG study was done with scalp electrodes positioned according to the 10-20 International system of electrode placement. Electrical activity was reviewed with band pass filter of 1-70Hz ,  sensitivity of 7 uV/mm, display speed of 37mm/sec with a 60Hz  notched filter applied as appropriate. EEG data were recorded continuously and digitally stored.  Video monitoring was available and reviewed as appropriate. Description: No clear posterior dominant rhythm was seen. Sleep was characterized by vertex waves, sleep spindles (12 to 14 Hz), maximal frontocentral region. There is continuous generalized 3 to 6 Hz theta-delta slowing. Hyperventilation and photic stimulation were not performed.   ABNORMALITY - Continuous slow, generalized IMPRESSION: This study is suggestive of generalized cerebral dysfunction (encephalopathy). No seizures or epileptiform discharges were seen throughout the recording.  Priyanka O Yadav   US  Venous Img Upper Uni Right(DVT) Result Date: 02/10/2024 EXAM: US  Duplex right Upper Extremity Veins. TECHNIQUE: Real-time ultrasound scan of the veins of the right upper extremity with color Doppler flow, spectral waveform analysis and compression. COMPARISON: None. CLINICAL HISTORY: Swelling of extremity, right. FINDINGS: LIMITATIONS: The study is limited secondary to bandage material on the right neck and right forearm precluding visualization of the right internal jugular vein. There is also partial obscuration of the radial and ulnar veins. SUPERFICIAL VEINS: The cephalic vein is compressible and demonstrates normal color Doppler flow. There is occlusive thrombus present within the basilic and radial veins. There is partial obscuration of the ulnar vein. DEEP VEINS: Visualization of the right internal jugular vein is precluded by bandage material. The subclavian, axillary, and brachial veins are compressible and demonstrate normal color Doppler flow. SOFT TISSUES: Bandage material is present on the right neck and right forearm. IMPRESSION: 1. Occlusive thrombus in the right basilic and radial veins. 2. Limited study due to bandage material on the right neck and right forearm, precluding  visualization of the right internal jugular vein and partially obscuring the radial and ulnar veins. Electronically signed by: Evalene Coho MD 02/10/2024 11:47 AM EDT RP Workstation: [id number]   DG Abd 1 View Result Date: 02/10/2024 EXAM: 1 VIEW XRAY OF THE ABDOMEN 02/10/2024 01:38:22 AM COMPARISON: None available. CLINICAL  HISTORY: Encounter for imaging study to confirm orogastric (OG) tube placement [id number]. NG tube placement. FINDINGS: LINES, TUBES AND DEVICES: Enteric tube courses below the hemidiaphragm with the tip overlying the expected region of the pyloric region/first portion of the duodenum. BOWEL: Nonobstructive bowel gas pattern. SOFT TISSUES: No opaque urinary calculi. BONES: No acute osseous abnormality. IMPRESSION: 1. Enteric tube tip overlies the region of the pylorus/first portion of the duodenum, appropriate for post-pyloric placement. Electronically signed by: Morgane Naveau MD 02/10/2024 01:42 AM EDT RP Workstation: [id number]   MR BRAIN WO CONTRAST Result Date: 02/10/2024 EXAM: MRI BRAIN WITHOUT CONTRAST 02/09/2024 01:59:31 PM TECHNIQUE: Multiplanar multisequence MRI of the head/brain was performed without the administration of intravenous contrast. COMPARISON: Comparison made with prior CT from 02/04/2024. CLINICAL HISTORY: Mental status change, unknown cause. Pt on vent.63 y.o male with significant PMH of HTN, CVA, sickle cell anemia, GERD, polysubstance abuse (EtOH abuse disorder, cocaine, marijuana tobacco abuse), and recurrent syncope who presented to the ED with chief complaints of syncopal episode. ; Per chart review, he has been having exertional chest pain, DOP for the past few days and today felt dizzy and passed out without hitting his head. FINDINGS: BRAIN AND VENTRICLES: Patchy T2/FLAIR hypertensity involving the periventricular and deep white matter of both cerebral hemispheres, consistent with chronic small vessel ischemic disease, moderate in nature. Patchy  involvement of the pons noted. Multiple superimposed remote lacunar infarcts are present about the bilateral basal ganglia, thalami, and pons. Small remote right cerebellar infarct. A prominent remote infarct involving the right basal ganglia/external capsule is noted with associated chronic hemosiderin staining (series 9, image 36). There are a few subtle subcentimeter foci of mild diffusion signal abnormality involving the subcortical aspect of the posterior left frontal parietal region (series 5, images 38, 34). Findings likely reflect evolving subacute ischemic infarcts; these are non-acute/recent in appearance. No associated acute hemorrhage or mass effect with these evolving infarcts. Few additional punctate chronic microhemorrhages are noted within the left cerebral hemisphere, likely small vessel related. No acute intracranial hemorrhage. No mass. No midline shift. No hydrocephalus. The sella is unremarkable. Normal flow voids. ORBITS: No acute abnormality. SINUSES AND MASTOIDS: Mild scattered mucosal thickening is present about the ethmoid air cells. BONES AND SOFT TISSUES: Normal marrow signal. Postoperative changes are noted within the visualized upper cervical spine. Patient is intubated. No acute soft tissue abnormality. IMPRESSION: 1. Small subtle foci of diffusion signal abnormality involving the subcortical posterior left frontal parietal region likely evolving small subacute ischemic infarcts. These are not acute/recent in appearance. 2. No other acute intracranial abnormality. 3. Underlying moderate chronic microvascular ischemic disease with multiple remote lacunar infarcts about the deep gray nuclei, pons, and cerebellum. Electronically signed by: Morene Hoard MD 02/10/2024 12:36 AM EDT RP Workstation: [id number]    Medications: Scheduled:  amLODipine  10 mg Per Tube Daily   B-complex with vitamin C  1 tablet Per Tube Daily   Chlorhexidine Gluconate Cloth  6 each Topical Daily    cyanocobalamin  1,000 mcg Intramuscular Daily   folic acid   1 mg Oral Daily   free water  30 mL Per Tube Q4H   insulin aspart  0-15 Units Subcutaneous Q4H   ipratropium-albuterol   3 mL Nebulization TID   labetalol   50 mg Per Tube BID   multivitamin with minerals  1 tablet Oral Daily   nicotine  21 mg Transdermal Daily   mouth rinse  15 mL Mouth Rinse Q2H   pantoprazole  (PROTONIX ) IV  40 mg Intravenous  Q12H   [START ON 02/14/2024] thiamine  (VITAMIN B1) injection  100 mg Intravenous Q24H    Assessment/Plan: 63 y.o. male with past medical history of hypertension, stroke, history of sickle cell anemia though not confirmed with blood testing, GERD polysubstance abuse who presented to the hospital on 10/24 with complaints of dizziness, syncope, SOB and exertional chest pain.  Patient then became confused/combative and was felt to be experiencing withdrawal symptoms.  Placed on IVC.  Patient further decompensated requiring sedation and intubation.  Patient has episodes of stiffening and shaking as well.  ? Posturing.  Consult called for further recommendations. MRI of the brain personally reviewed and reveals subacute infarcts in the left fronto-parietal region as well as what appears to be a punctate left frontal infarct that seems more acute. All of these are quite small and would not be expected to cause the current neurological examination.  Based on imaging though etiology does appear embolic.  Patient has had some fevers and elevated wbc count.  +PNA.  RUE DVT noted and patient started on heparinMultiple metabolic issues are present as well.  EEG only significant for slowing.   Mental status likely multifactorial In stroke work up, echocardiogram was performed on 10/25 and shows no cardiac source of emboli with EF of 60-65%.   A1c 8.1   Recommendations: With PNA  identified, LP not indicated at this time Patient showing some improvement and decreased posturing-like events.  Ceribell not indicated  at this time.   Will continue to follow with you May hold ASA at this time due to heparin  initiation Statin initiation when appropriate.  Carotid dopplers or other vascular imaging when appropriate    LOS: 7 days   Sonny Hock, MD Neurology  02/11/2024  12:07 PM

## 2024-02-11 NOTE — Consult Note (Signed)
 PHARMACY - ANTICOAGULATION CONSULT NOTE  Pharmacy Consult for heparin  Indication: VTE treatment  Allergies  Allergen Reactions   Bee Venom Anaphylaxis   Bovine (Beef) Protein Other (See Comments)    Personal preference   Bovine (Beef) Protein-Containing Drug Products     Personal preference   Lactalbumin    Milk-Related Compounds Nausea And Vomiting    Intoloerance   Pegademase Bovine     Other reaction(s): Unknown Personal preference Personal preference Personal preference    Tilactase Nausea And Vomiting    Intoloerance   Milk (Cow) Nausea And Vomiting    Intoloerance    Patient Measurements: Height: 5' 8 (172.7 cm) Weight: 86 kg (189 lb 9.5 oz) IBW/kg (Calculated) : 68.4 HEPARIN  DW (KG): 87.4  Weight Used: 87.4 kg  Vital Signs: Temp: 99.1 F (37.3 C) (10/31 1000) Temp Source: Axillary (10/31 1000) BP: 154/74 (10/31 1300) Pulse Rate: 96 (10/31 1300)  Labs: Recent Labs    02/09/24 0443 02/10/24 0439 02/10/24 1941 02/11/24 0614 02/11/24 1212  HGB 10.0* 9.9*  --  9.7*  --   HCT 30.9* 30.8*  --  30.1*  --   PLT 226 268  --  269  --   APTT  --   --  79*  --   --   HEPARINUNFRC  --   --  0.75* 0.55 0.49  CREATININE 1.06 1.15  --  1.01  --     Estimated Creatinine Clearance: 77.8 mL/min (by C-G formula based on SCr of 1.01 mg/dL).   Medical History: Past Medical History:  Diagnosis Date   Alcoholism (HCC)    Allergic rhinitis    Allergic state    Anemia    Arthritis    Cancer (HCC)    Chronic hepatitis C (HCC)    Depression    Esophagitis, reflux    GERD (gastroesophageal reflux disease)    Gout    Helicobacter pylori gastritis    Hepatitis    Hypertension    Neuromuscular disorder (HCC)    Osteoarthrosis    Peripheral neuropathy    Rhabdomyolysis    Sickle cell anemia (HCC)    Stroke (HCC)    Stroke (HCC)    Vitiligo     Medications:  Scheduled:   amLODipine  10 mg Per Tube Daily   B-complex with vitamin C  1 tablet Per Tube  Daily   Chlorhexidine Gluconate Cloth  6 each Topical Daily   cyanocobalamin  1,000 mcg Intramuscular Daily   folic acid   1 mg Oral Daily   free water  30 mL Per Tube Q4H   insulin aspart  0-15 Units Subcutaneous Q4H   ipratropium-albuterol   3 mL Nebulization TID   labetalol   50 mg Per Tube BID   multivitamin with minerals  1 tablet Oral Daily   nicotine  21 mg Transdermal Daily   mouth rinse  15 mL Mouth Rinse Q2H   pantoprazole  (PROTONIX ) IV  40 mg Intravenous Q12H   [START ON 02/14/2024] thiamine  (VITAMIN B1) injection  100 mg Intravenous Q24H   Infusions:   cefTRIAXone (ROCEPHIN)  IV     feeding supplement (KATE [employer]ARMS STANDARD ENT 1.4) 60 mL/hr at 02/11/24 0600   heparin  1,350 Units/hr (02/11/24 0600)   thiamine  (VITAMIN B1) injection 500 mg (02/11/24 0946)   PRN: acetaminophen  **OR** acetaminophen , fentaNYL  (SUBLIMAZE ) injection, hydrALAZINE , ipratropium-albuterol , labetalol , midazolam  PF, mouth rinse, polyethylene glycol Anti-infectives (From admission, onward)    Start     Dose/Rate Route Frequency Ordered Stop  02/11/24 2200  cefTRIAXone (ROCEPHIN) 2 g in sodium chloride  0.9 % 100 mL IVPB        2 g 200 mL/hr over 30 Minutes Intravenous Every 24 hours 02/11/24 0839 02/16/24 2159   02/06/24 2000  cefTRIAXone (ROCEPHIN) 2 g in sodium chloride  0.9 % 100 mL IVPB        2 g 200 mL/hr over 30 Minutes Intravenous Every 24 hours 02/06/24 1910 02/10/24 2054       Assessment: Beryl Balz is a 63 y.o. male with PMH significant for  HTN, CVA, sickle cell anemia, GERD, polysubstance abuse. Presented with chief complaint of syncopal episode.   10/30 US  Venous RUE significant for occlusive thrombus in the right basilic and radial veins.   Patient had been receiving fondaparinux 2.5mg  subcutaneously every 24 hours for prophylaxis.  - Last dose 10/29 @ 1405  10/31: Hgb and plt stable  Goal of Therapy:  Heparin  level 0.3-0.7 units/ml  Monitor platelets by anticoagulation  protocol: Yes  Date   Time    HL    10/31  0614  0.55, therapeutic x1 @ 1350 units/h 10/31  1212  0.49, therapeutic x2 @ 1350 units/h  Plan: Continue heparin  infusion rate at 1350 units/hr.  Check heparin  level with AM labs CBC daily while on heparin .   Leonor Argyle, PharmD 02/11/2024 1:57 PM

## 2024-02-11 NOTE — Progress Notes (Addendum)
 0800 Opens eyes to voice. Only moves right foot to command. 1200 CIWA score 15 but patient on no sedation for 24+ hours and drowsy. No sedation orders for CIWA.1800 more awake now. Placed back on a rate on the ventilator due to fatigue. Small amount of right shoulder movement noted.

## 2024-02-11 NOTE — Progress Notes (Signed)
 NAME:  Edward Weber, MRN:  [id number], DOB:  [birth year], LOS: 7 ADMISSION DATE:  02/04/2024 History of Present Illness:  63 y.o male with significant PMH of HTN, CVA, sickle cell anemia, GERD, polysubstance abuse (EtOH abuse disorder, cocaine, marijuana tobacco abuse), and recurrent syncope who presented to the ED with chief complaints of syncopal episode. Per chart review, he has been having exertional chest pain, DOP for the past few days and today felt dizzy and passed out without hitting his head.   ED Course: Initial vital signs showed HR of 84beats/minute, BP203/102 mm Hg, the RR 20 breaths/minute, 98%O2 Sat on and Temp of 98.63F (36.7C).    Pertinent Labs/Diagnostics Findings: Na+/ K+:141/4.0 Glucose:166  WBC:7.3 K/L Hgb/Hct:11.5/34.9  COVID PCR: Negative,  UDS+ Marijuana CXR> CTH> CTA Chest> CT Abd/pelvis>negative for acute abnormality   Admitted to TR[facility] service for further management of syncopal episode. SEE SIGNIFICANT HOSPITAL EVENTS BELOW.  Pertinent  Medical History    Alcoholism (HCC)     Allergic rhinitis     Allergic state     Anemia     Arthritis     Cancer (HCC)     Chronic hepatitis C (HCC)     Depression     Esophagitis, reflux     GERD (gastroesophageal reflux disease)     Gout     Helicobacter pylori gastritis     Hepatitis     Hypertension     Neuromuscular disorder (HCC)     Osteoarthrosis     Peripheral neuropathy     Rhabdomyolysis     Sickle cell anemia (HCC)     Stroke [facility]     Stroke [facility])     Vitiligo     Significant Hospital Events: Including procedures, antibiotic start and stop dates in addition to other pertinent events   10/24: Admitted to TRH service for syncopal workup.  Psych consulted for agitation and threatening to leave AMA. Due to history of polysubstance abuse and AMS patient was IVCd 10/25: Remains IVC due to agitation and delirium.  Phenobarb taper added per primary team. 10/26: Transferred to the ICU for worsening  somnolent and increased work of breathing requiring emergent intubation. 10/27: Remains critically ill requiring mechanical ventilation. Agitation requiring propofol . Remains on ceftriaxone for aspiration pna coverage. 10/28: Pt remains mechanically intubated unable to liberate from ventilator due to poor neurological exam and excessive secretions.  Pending MRI Brain  10/30: Remains critically ill requiring mechanically intubation. Extubation precluded due to neurological status. MRI found subacute infarcts. Will consult Neuro for further evaluation and order an EEG.  Interim History / Subjective:  -- Patient seen and evaluated today.  -- He is a bit more awake. Opens his eyes to voice commands.  -- Sputum Culture with Strep Pneumonia.  -- RUE DVT started on heparin  drip yesterday.  -- MRI 10/29  without any acute findings that could explain his encephalopathy.  -- EEG without any seizure activity.   Objective    Blood pressure (!) 159/83, pulse (!) 107, temperature (!) 100.5 F (38.1 C), temperature source Axillary, resp. rate (!) 27, height 5' 8 (1.727 m), weight 86 kg, SpO2 98%.    Vent Mode: PSV FiO2 (%):  [28 %] 28 % Set Rate:  [20 bmp] 20 bmp Vt Set:  [450 mL] 450 mL PEEP:  [5 cmH20] 5 cmH20 Pressure Support:  [5 cmH20] 5 cmH20 Plateau Pressure:  [18 cmH20] 18 cmH20   Intake/Output Summary (Last 24 hours) at 02/11/2024 0830 Last  data filed at 02/11/2024 0700 Gross per 24 hour  Intake 1829.48 ml  Output 1740 ml  Net 89.48 ml   Filed Weights   02/07/24 0500 02/08/24 0500 02/09/24 0500  Weight: 79 kg 85.5 kg 86 kg    Examination: General: Intubated, off sedation  HENT: Supple neck, reactive pupils  Lungs: clear bilateral air entry  Cardiovascular: Normal S1, Normal S2, RRR  Abdomen: Soft, non tender, non distended + BS  Extremities: Warm and well perfused.   Labs and imaging were reviewed.   Assessment and Plan  Edward Weber is a 63 year old male patient with a past  medical history of hypertension, CVA, sickle cell anemia, GERD, polysubstance abuse including EtOH cocaine and marijuana presented to the ED for syncopal episode.  He was intubated for airway protection on 10/26.  Chest x-ray without any significant findings.  He is being managed for alcohol withdrawal and Strep pneumonia.    More awake today, opens his eyes to voice. Extremely weak.   MRI brian 10/29 without any significant acute findings that could explain his current ongoing encephalopathy. EEG without any seizure activity. WBC downtrending. Appreciate neuro recs.   SBT/SAT this AM.   Respiratory culture growing strep pneumo - C/w Ceftriaxone for 5 more days for a total of 10 days given ongoing fevers.   Critical care time: 40 minutes     Darrin Barn, MD [place] [facility] 02/11/2024 8:41 AM

## 2024-02-12 ENCOUNTER — Inpatient Hospital Stay

## 2024-02-12 DIAGNOSIS — R079 Chest pain, unspecified: Secondary | ICD-10-CM | POA: Diagnosis not present

## 2024-02-12 DIAGNOSIS — J690 Pneumonitis due to inhalation of food and vomit: Secondary | ICD-10-CM | POA: Diagnosis not present

## 2024-02-12 DIAGNOSIS — I6340 Cerebral infarction due to embolism of unspecified cerebral artery: Secondary | ICD-10-CM | POA: Diagnosis not present

## 2024-02-12 DIAGNOSIS — I10 Essential (primary) hypertension: Secondary | ICD-10-CM | POA: Diagnosis not present

## 2024-02-12 DIAGNOSIS — F10931 Alcohol use, unspecified with withdrawal delirium: Secondary | ICD-10-CM | POA: Diagnosis not present

## 2024-02-12 DIAGNOSIS — R4182 Altered mental status, unspecified: Secondary | ICD-10-CM | POA: Diagnosis not present

## 2024-02-12 DIAGNOSIS — G9341 Metabolic encephalopathy: Secondary | ICD-10-CM | POA: Diagnosis not present

## 2024-02-12 LAB — CULTURE, BLOOD (ROUTINE X 2)
Culture: NO GROWTH
Culture: NO GROWTH
Special Requests: ADEQUATE

## 2024-02-12 LAB — BASIC METABOLIC PANEL WITH GFR
Anion gap: 10 (ref 5–15)
BUN: 36 mg/dL — ABNORMAL HIGH (ref 8–23)
CO2: 23 mmol/L (ref 22–32)
Calcium: 8.7 mg/dL — ABNORMAL LOW (ref 8.9–10.3)
Chloride: 107 mmol/L (ref 98–111)
Creatinine, Ser: 1.12 mg/dL (ref 0.61–1.24)
GFR, Estimated: >60 mL/min (ref >60)
Glucose, Bld: 233 mg/dL — ABNORMAL HIGH (ref 70–99)
Potassium: 4.3 mmol/L (ref 3.5–5.1)
Sodium: 140 mmol/L (ref 135–145)

## 2024-02-12 LAB — GLUCOSE, CAPILLARY
Glucose-Capillary: 180 mg/dL — ABNORMAL HIGH (ref 70–99)
Glucose-Capillary: 186 mg/dL — ABNORMAL HIGH (ref 70–99)
Glucose-Capillary: 189 mg/dL — ABNORMAL HIGH (ref 70–99)
Glucose-Capillary: 213 mg/dL — ABNORMAL HIGH (ref 70–99)
Glucose-Capillary: 241 mg/dL — ABNORMAL HIGH (ref 70–99)
Glucose-Capillary: 257 mg/dL — ABNORMAL HIGH (ref 70–99)

## 2024-02-12 LAB — PHOSPHORUS: Phosphorus: 3.6 mg/dL (ref 2.5–4.6)

## 2024-02-12 LAB — CBC
HCT: 30.7 % — ABNORMAL LOW (ref 39.0–52.0)
Hemoglobin: 9.8 g/dL — ABNORMAL LOW (ref 13.0–17.0)
MCH: 30.6 pg (ref 26.0–34.0)
MCHC: 31.9 g/dL (ref 30.0–36.0)
MCV: 95.9 fL (ref 80.0–100.0)
Platelets: 332 K/uL (ref 150–400)
RBC: 3.2 MIL/uL — ABNORMAL LOW (ref 4.22–5.81)
RDW: 15.1 % (ref 11.5–15.5)
WBC: 13 K/uL — ABNORMAL HIGH (ref 4.0–10.5)
nRBC: 0 % (ref 0.0–0.2)

## 2024-02-12 LAB — MAGNESIUM: Magnesium: 2.3 mg/dL (ref 1.7–2.4)

## 2024-02-12 LAB — HEPARIN LEVEL (UNFRACTIONATED): Heparin Unfractionated: 0.48 [IU]/mL (ref 0.30–0.70)

## 2024-02-12 MED ORDER — INSULIN GLARGINE-YFGN 100 UNIT/ML ~~LOC~~ SOLN
10.0000 [IU] | Freq: Every day | SUBCUTANEOUS | Status: DC
  Administered 2024-02-12 – 2024-02-22 (×11): 10 [IU] via SUBCUTANEOUS
  Filled 2024-02-12 (×11): qty 0.1

## 2024-02-12 MED ORDER — FOLIC ACID 1 MG PO TABS: 1.0000 mg | ORAL_TABLET | Freq: Every day | ORAL | Status: DC

## 2024-02-12 MED ORDER — MIDAZOLAM HCL (PF) 2 MG/2ML IJ SOLN
2.0000 mg | Freq: Once | INTRAMUSCULAR | Status: AC
  Administered 2024-02-12: 2 mg via INTRAVENOUS

## 2024-02-12 MED ORDER — LABETALOL HCL 200 MG PO TABS
200.0000 mg | ORAL_TABLET | Freq: Two times a day (BID) | ORAL | Status: DC
  Administered 2024-02-12: 200 mg
  Filled 2024-02-12: qty 1

## 2024-02-12 MED ORDER — GADOBUTROL 1 MMOL/ML IV SOLN
8.0000 mL | Freq: Once | INTRAVENOUS | Status: AC | PRN
  Administered 2024-02-12: 7.5 mL via INTRAVENOUS

## 2024-02-12 MED ORDER — ENOXAPARIN SODIUM 100 MG/ML IJ SOSY
1.0000 mg/kg | PREFILLED_SYRINGE | Freq: Two times a day (BID) | INTRAMUSCULAR | Status: DC
  Administered 2024-02-12 – 2024-02-21 (×18): 85 mg via SUBCUTANEOUS
  Filled 2024-02-12 (×21): qty 1

## 2024-02-12 NOTE — Progress Notes (Signed)
 NAME:  Edward Weber, MRN:  [id number], DOB:  1961/02/12, LOS: 8 ADMISSION DATE:  02/04/2024 History of Present Illness:  63 y.o male with significant PMH of HTN, CVA, sickle cell anemia, GERD, polysubstance abuse (EtOH abuse disorder, cocaine, marijuana tobacco abuse), and recurrent syncope who presented to the ED with chief complaints of syncopal episode. Per chart review, he has been having exertional chest pain, DOP for the past few days and today felt dizzy and passed out without hitting his head.   ED Course: Initial vital signs showed HR of 84beats/minute, BP203/102 mm Hg, the RR 20 breaths/minute, 98%O2 Sat on and Temp of 98.25F (36.7C).    Pertinent Labs/Diagnostics Findings: Na+/ K+:141/4.0 Glucose:166  WBC:7.3 K/L Hgb/Hct:11.5/34.9  COVID PCR: Negative,  UDS+ Marijuana CXR> CTH> CTA Chest> CT Abd/pelvis>negative for acute abnormality   Admitted to TRH service for further management of syncopal episode. SEE SIGNIFICANT HOSPITAL EVENTS BELOW.  Pertinent  Medical History    Alcoholism (HCC)     Allergic rhinitis     Allergic state     Anemia     Arthritis     Cancer (HCC)     Chronic hepatitis C (HCC)     Depression     Esophagitis, reflux     GERD (gastroesophageal reflux disease)     Gout     Helicobacter pylori gastritis     Hepatitis     Hypertension     Neuromuscular disorder (HCC)     Osteoarthrosis     Peripheral neuropathy     Rhabdomyolysis     Sickle cell anemia (HCC)     Stroke [facility])     Stroke [facility])     Vitiligo     Significant Hospital Events: Including procedures, antibiotic start and stop dates in addition to other pertinent events   10/24: Admitted to TRH service for syncopal workup.  Psych consulted for agitation and threatening to leave AMA. Due to history of polysubstance abuse and AMS patient was IVCd 10/25: Remains IVC due to agitation and delirium.  Phenobarb taper added per primary team. 10/26: Transferred to the ICU for worsening  somnolent and increased work of breathing requiring emergent intubation. 10/27: Remains critically ill requiring mechanical ventilation. Agitation requiring propofol . Remains on ceftriaxone for aspiration pna coverage. 10/28: Pt remains mechanically intubated unable to liberate from ventilator due to poor neurological exam and excessive secretions.  Pending MRI Brain  10/30: Remains critically ill requiring mechanically intubation. Extubation precluded due to neurological status. MRI found subacute infarcts. Will consult Neuro for further evaluation and order an EEG.  Interim History / Subjective:  -- Patient seen and evaluated today.  -- He is a much more awake. Opens his eyes to voice commands. Moves his feet to commands. But not moving his upper extremities.  -- Sputum Culture with Strep Pneumonia.  -- RUE DVT started on heparin  drip yesterday.  -- MRI 10/29  without any acute findings that could explain his encephalopathy.  -- EEG without any seizure activity.   Objective    Blood pressure (!) 170/76, pulse (!) 105, temperature 100.1 F (37.8 C), temperature source Axillary, resp. rate (!) 23, height 5' 8 (1.727 m), weight 85.5 kg, SpO2 97%.    Vent Mode: PSV FiO2 (%):  [28 %] 28 % Set Rate:  [20 bmp] 20 bmp Vt Set:  [450 mL] 450 mL PEEP:  [5 cmH20] 5 cmH20 Pressure Support:  [5 cmH20] 5 cmH20   Intake/Output Summary (Last 24 hours) at  02/12/2024 1118 Last data filed at 02/12/2024 0925 Gross per 24 hour  Intake 2219.15 ml  Output 1650 ml  Net 569.15 ml   Filed Weights   02/08/24 0500 02/09/24 0500 02/12/24 0428  Weight: 85.5 kg 86 kg 85.5 kg    Examination: General: Intubated, off sedation, opens his eyes to voice commands. HENT: Supple neck, reactive pupils  Lungs: clear bilateral air entry  Cardiovascular: Normal S1, Normal S2, RRR  Abdomen: Soft, non tender, non distended + BS  Extremities: Warm and well perfused.   Labs and imaging were reviewed.   Assessment and  Plan  Kush is a 63 year old male patient with a past medical history of hypertension, CVA, sickle cell anemia, GERD, polysubstance abuse including EtOH cocaine and marijuana presented to the ED for syncopal episode.  He was intubated for airway protection on 10/26.  Chest x-ray without any significant findings.  He is being managed for alcohol withdrawal and Strep pneumonia.    More awake today, opens his eyes to voice. Extremely weak.   MRI brian 10/29 without any significant acute findings that could explain his current ongoing encephalopathy. EEG without any seizure activity. WBC downtrending. Appreciate neuro recs. Unclear reason behind his profound weakness and inability to move his upper extremities. Will obtian an MRI cervical spine. Will decide on extubation in the pm.   Respiratory culture growing strep pneumo - C/w Ceftriaxone for 5 more days for a total of 10 days given ongoing fevers.   Critical care time: 40 minutes     Darrin Barn, MD [place] [facility] 02/12/2024 11:18 AM

## 2024-02-12 NOTE — Progress Notes (Signed)
 PHARMACY CONSULT NOTE - ELECTROLYTES  Pharmacy Consult for Electrolyte Monitoring and Replacement   Recent Labs: Height: 5' 8 (172.7 cm) Weight: 85.5 kg (188 lb 7.9 oz) IBW/kg (Calculated) : 68.4 Estimated Creatinine Clearance: 69.9 mL/min (by C-G formula based on SCr of 1.12 mg/dL). Potassium (mmol/L)  Date Value  02/12/2024 4.3  07/17/2014 4.0   Magnesium  (mg/dL)  Date Value  88/98/7974 2.3   Calcium  (mg/dL)  Date Value  88/98/7974 8.7 (L)   Calcium , Total (mg/dL)  Date Value  95/94/7983 9.3   Albumin (g/dL)  Date Value  89/72/7974 3.5  04/01/2014 3.5   Phosphorus (mg/dL)  Date Value  88/98/7974 3.6   Sodium (mmol/L)  Date Value  02/12/2024 140  07/17/2014 140   Assessment  Edward Weber is a 63 y.o. male presenting with syncope. PMH significant for HTN, CVA, sickle cell anemia, GERD, polysubstance abuse. Pharmacy has been consulted to monitor and replace electrolytes.  Diet: NPO- OG tube  - Feeding supplement - [name] Standard ENT 1.4 @ 60 mL/hr  MIVF: n/a  Pertinent medications: Free water 30 ml q4H  Goal of Therapy: Electrolytes WNL  Plan:  No replacement needed F/u with AM labs.   Thank you for allowing pharmacy to be a part of this patient's care.  Cathaleen Blanch, PharmD, BCPS 02/12/2024 8:53 AM

## 2024-02-12 NOTE — Progress Notes (Signed)
 Pts password and contacts updated with Pts next of kin Shereda.   Sedation remains off. Pt unable to move arms/hands, but can withdraw shoulders to pain. Opens eyes to verbal stimuli but unable to nod or shake head in understanding. Possibly wiggling toes on command bilaterally but not consistent.

## 2024-02-12 NOTE — Consult Note (Addendum)
 PHARMACY - ANTICOAGULATION CONSULT NOTE  Pharmacy Consult for heparin  Indication: VTE treatment  Allergies  Allergen Reactions   Bee Venom Anaphylaxis   Bovine (Beef) Protein Other (See Comments)    Personal preference   Bovine (Beef) Protein-Containing Drug Products     Personal preference   Lactalbumin    Milk-Related Compounds Nausea And Vomiting    Intoloerance   Pegademase Bovine     Other reaction(s): Unknown Personal preference Personal preference Personal preference    Tilactase Nausea And Vomiting    Intoloerance   Milk (Cow) Nausea And Vomiting    Intoloerance    Patient Measurements: Height: 5' 8 (172.7 cm) Weight: 85.5 kg (188 lb 7.9 oz) IBW/kg (Calculated) : 68.4 HEPARIN  DW (KG): 87.4  Weight Used: 87.4 kg  Vital Signs: Temp: 99 F (37.2 C) (11/01 0400) Temp Source: Axillary (11/01 0400) BP: 163/72 (11/01 0600) Pulse Rate: 82 (11/01 0600)  Labs: Recent Labs    02/10/24 0439 02/10/24 1941 02/10/24 1941 02/11/24 0614 02/11/24 1212 02/12/24 0535  HGB 9.9*  --   --  9.7*  --  9.8*  HCT 30.8*  --   --  30.1*  --  30.7*  PLT 268  --   --  269  --  332  APTT  --  79*  --   --   --   --   HEPARINUNFRC  --  0.75*   < > 0.55 0.49 0.48  CREATININE 1.15  --   --  1.01  --  1.12   < > = values in this interval not displayed.    Estimated Creatinine Clearance: 69.9 mL/min (by C-G formula based on SCr of 1.12 mg/dL).   Medical History: Past Medical History:  Diagnosis Date   Alcoholism (HCC)    Allergic rhinitis    Allergic state    Anemia    Arthritis    Cancer (HCC)    Chronic hepatitis C (HCC)    Depression    Esophagitis, reflux    GERD (gastroesophageal reflux disease)    Gout    Helicobacter pylori gastritis    Hepatitis    Hypertension    Neuromuscular disorder (HCC)    Osteoarthrosis    Peripheral neuropathy    Rhabdomyolysis    Sickle cell anemia (HCC)    Stroke (HCC)    Stroke (HCC)    Vitiligo     Medications:   Scheduled:   amLODipine  10 mg Per Tube Daily   B-complex with vitamin C  1 tablet Per Tube Daily   Chlorhexidine Gluconate Cloth  6 each Topical Daily   cyanocobalamin  1,000 mcg Intramuscular Daily   folic acid   1 mg Oral Daily   free water  30 mL Per Tube Q4H   insulin aspart  0-15 Units Subcutaneous Q4H   ipratropium-albuterol   3 mL Nebulization TID   labetalol   50 mg Per Tube BID   multivitamin with minerals  1 tablet Oral Daily   nicotine  21 mg Transdermal Daily   mouth rinse  15 mL Mouth Rinse Q2H   pantoprazole  (PROTONIX ) IV  40 mg Intravenous Q12H   [START ON 02/14/2024] thiamine  (VITAMIN B1) injection  100 mg Intravenous Q24H   Infusions:   cefTRIAXone (ROCEPHIN)  IV Stopped (02/11/24 2205)   feeding supplement ([employer] STANDARD ENT 1.4) 60 mL/hr at 02/12/24 0600   heparin  1,350 Units/hr (02/12/24 0600)   thiamine  (VITAMIN B1) injection Stopped (02/11/24 1016)   PRN: acetaminophen  **OR**  acetaminophen , fentaNYL  (SUBLIMAZE ) injection, hydrALAZINE , ipratropium-albuterol , labetalol , midazolam  PF, mouth rinse, polyethylene glycol Anti-infectives (From admission, onward)    Start     Dose/Rate Route Frequency Ordered Stop   02/11/24 2200  cefTRIAXone (ROCEPHIN) 2 g in sodium chloride  0.9 % 100 mL IVPB        2 g 200 mL/hr over 30 Minutes Intravenous Every 24 hours 02/11/24 0839 02/16/24 2159   02/06/24 2000  cefTRIAXone (ROCEPHIN) 2 g in sodium chloride  0.9 % 100 mL IVPB        2 g 200 mL/hr over 30 Minutes Intravenous Every 24 hours 02/06/24 1910 02/10/24 2054       Assessment: Edward Weber is a 63 y.o. male with PMH significant for  HTN, CVA, sickle cell anemia, GERD, polysubstance abuse. Presented with chief complaint of syncopal episode.   10/30 US  Venous RUE significant for occlusive thrombus in the right basilic and radial veins.   Patient had been receiving fondaparinux 2.5mg  subcutaneously every 24 hours for prophylaxis.  - Last dose 10/29 @  1405  Goal of Therapy:  Heparin  level 0.3-0.7 units/ml  Monitor platelets by anticoagulation protocol: Yes  10/31 0614 HL 0.55, therapeutic x 1 10/31 1212 0.49, therapeutic x2 @ 1350 units/h  11/01 0535 0.48, therapeutic x 3  Plan: Continue heparin  infusion rate at 1350 units/hr.  Recheck HL daily w/ AM labs while therapeutic CBC daily while on heparin .   Rankin CANDIE Dills, PharmD, [facility] 02/12/2024 6:16 AM

## 2024-02-12 NOTE — Plan of Care (Signed)
  Problem: Clinical Measurements: Goal: Ability to maintain clinical measurements within normal limits will improve Outcome: Progressing Goal: Diagnostic test results will improve Outcome: Progressing Goal: Respiratory complications will improve Outcome: Progressing Goal: Cardiovascular complication will be avoided Outcome: Progressing   Problem: Nutrition: Goal: Adequate nutrition will be maintained Outcome: Progressing   Problem: Coping: Goal: Level of anxiety will decrease Outcome: Progressing   Problem: Elimination: Goal: Will not experience complications related to bowel motility Outcome: Progressing Goal: Will not experience complications related to urinary retention Outcome: Progressing   Problem: Pain Managment: Goal: General experience of comfort will improve and/or be controlled Outcome: Progressing   Problem: Safety: Goal: Ability to remain free from injury will improve Outcome: Progressing   Problem: Skin Integrity: Goal: Risk for impaired skin integrity will decrease Outcome: Progressing   Problem: Education: Goal: Knowledge of General Education information will improve Description: Including pain rating scale, medication(s)/side effects and non-pharmacologic comfort measures Outcome: Not Progressing

## 2024-02-12 NOTE — Progress Notes (Signed)
 Subjective: Patient remains intubated.  Somewhat more alert today.    Objective: Current vital signs: BP (!) 170/76   Pulse (!) 105   Temp 100.1 F (37.8 C) (Axillary)   Resp (!) 23   Ht 5' 8 (1.727 m)   Wt 85.5 kg   SpO2 97%   BMI 28.66 kg/m  Vital signs in last 24 hours: Temp:  [98.8 F (37.1 C)-100.1 F (37.8 C)] 100.1 F (37.8 C) (11/01 0739) Pulse Rate:  [81-107] 105 (11/01 0926) Resp:  [18-28] 23 (11/01 0800) BP: (140-187)/(72-92) 170/76 (11/01 0926) SpO2:  [94 %-99 %] 97 % (11/01 1108) FiO2 (%):  [28 %] 28 % (11/01 1108) Weight:  [85.5 kg] 85.5 kg (11/01 0428)  Intake/Output from previous day: 10/31 0701 - 11/01 0700 In: 2170.4 [I.V.:335.6; NG/GT:1680; IV Piggyback:154.9] Out: 1800 [Urine:1600; Stool:200] Intake/Output this shift: Total I/O In: 78.7 [I.V.:8.7; NG/GT:70] Out: 250 [Urine:250] Nutritional status:  Diet Order             Diet NPO time specified Except for: Other (See Comments)  Diet effective now                   Neurologic Exam: Mental Status: Eyes open.  Appears to attend to examiner.  Does not follow commands.   Cranial Nerves: II: patient does not respond confrontation bilaterally III,IV,VI: Oculocephalic response present bilaterally.  V,VII: corneal reflex present bilaterally  XI: trapezius strength unable to test bilaterally XII: tongue strength unable to test Motor: RUE swollen.  Spontaneous movement noted in right lower extremity Sensory: Responds to noxious stimuli in the right lower extremity only.      Lab Results: Basic Metabolic Panel: Recent Labs  Lab 02/08/24 0416 02/09/24 0443 02/10/24 0439 02/11/24 0614 02/12/24 0535  NA 138 142 142 143 140  K 4.1 4.2 4.6 4.3 4.3  CL 108 107 109 109 107  CO2 21* 21* 24 23 23   GLUCOSE 187* 174* 248* 230* 233*  BUN 23 25* 32* 35* 36*  CREATININE 1.35* 1.06 1.15 1.01 1.12  CALCIUM  7.7* 8.3* 8.6* 8.5* 8.7*  MG 2.2 2.2 2.4 2.3 2.3  PHOS 3.4 2.8 3.3 2.9 3.6    Liver  Function Tests: Recent Labs  Lab 02/07/24 0202  AST 22  ALT 17  ALKPHOS 36*  BILITOT 1.0  PROT 7.4  ALBUMIN 3.5   No results for input(s): LIPASE, AMYLASE in the last 168 hours. Recent Labs  Lab 02/07/24 0907 02/10/24 0957  AMMONIA 19 <13    CBC: Recent Labs  Lab 02/08/24 0416 02/09/24 0443 02/10/24 0439 02/11/24 0614 02/12/24 0535  WBC 15.2* 13.0* 12.4* 11.9* 13.0*  HGB 10.6* 10.0* 9.9* 9.7* 9.8*  HCT 32.9* 30.9* 30.8* 30.1* 30.7*  MCV 96.8 96.9 96.9 96.2 95.9  PLT 191 226 268 269 332    Cardiac Enzymes: No results for input(s): CKTOTAL, CKMB, CKMBINDEX, TROPONINI in the last 168 hours.  Lipid Panel: Recent Labs  Lab 02/07/24 0202  TRIG 206*    CBG: Recent Labs  Lab 02/11/24 1544 02/11/24 1920 02/11/24 2337 02/12/24 0335 02/12/24 0735  GLUCAP 194* 197* 223* 241* 213*    Microbiology: Results for orders placed or performed during the hospital encounter of 02/04/24  Resp panel by RT-PCR (RSV, Flu A&B, Covid) Anterior Nasal Swab     Status: None   Collection Time: 02/04/24  2:55 AM   Specimen: Anterior Nasal Swab  Result Value Ref Range Status   SARS Coronavirus 2 by RT PCR NEGATIVE  NEGATIVE Final    Comment: (NOTE) SARS-CoV-2 target nucleic acids are NOT DETECTED.  The SARS-CoV-2 RNA is generally detectable in upper respiratory specimens during the acute phase of infection. The lowest concentration of SARS-CoV-2 viral copies this assay can detect is 138 copies/mL. A negative result does not preclude SARS-Cov-2 infection and should not be used as the sole basis for treatment or other patient management decisions. A negative result may occur with  improper specimen collection/handling, submission of specimen other than nasopharyngeal swab, presence of viral mutation(s) within the areas targeted by this assay, and inadequate number of viral copies(<138 copies/mL). A negative result must be combined with clinical observations, patient  history, and epidemiological information. The expected result is Negative.  Fact Sheet for Patients:  bloggercourse.com  Fact Sheet for Healthcare Providers:  seriousbroker.it  This test is no t yet approved or cleared by the [place]  FDA and  has been authorized for detection and/or diagnosis of SARS-CoV-2 by FDA under an Emergency Use Authorization (EUA). This EUA will remain  in effect (meaning this test can be used) for the duration of the COVID-19 declaration under Section 564(b)(1) of the Act, 21 U.S.C.section 360bbb-3(b)(1), unless the authorization is terminated  or revoked sooner.       Influenza A by PCR NEGATIVE NEGATIVE Final   Influenza B by PCR NEGATIVE NEGATIVE Final    Comment: (NOTE) The Xpert Xpress SARS-CoV-2/FLU/RSV plus assay is intended as an aid in the diagnosis of influenza from Nasopharyngeal swab specimens and should not be used as a sole basis for treatment. Nasal washings and aspirates are unacceptable for Xpert Xpress SARS-CoV-2/FLU/RSV testing.  Fact Sheet for Patients: bloggercourse.com  Fact Sheet for Healthcare Providers: seriousbroker.it  This test is not yet approved or cleared by the [place]  FDA and has been authorized for detection and/or diagnosis of SARS-CoV-2 by FDA under an Emergency Use Authorization (EUA). This EUA will remain in effect (meaning this test can be used) for the duration of the COVID-19 declaration under Section 564(b)(1) of the Act, 21 U.S.C. section 360bbb-3(b)(1), unless the authorization is terminated or revoked.     Resp Syncytial Virus by PCR NEGATIVE NEGATIVE Final    Comment: (NOTE) Fact Sheet for Patients: bloggercourse.com  Fact Sheet for Healthcare Providers: seriousbroker.it  This test is not yet approved or cleared by the [place]  FDA  and has been authorized for detection and/or diagnosis of SARS-CoV-2 by FDA under an Emergency Use Authorization (EUA). This EUA will remain in effect (meaning this test can be used) for the duration of the COVID-19 declaration under Section 564(b)(1) of the Act, 21 U.S.C. section 360bbb-3(b)(1), unless the authorization is terminated or revoked.  Performed at [facility], [address] [place], [place] [address]   Respiratory (~20 pathogens) panel by PCR     Status: None   Collection Time: 02/07/24  4:49 AM   Specimen: Nasopharyngeal Swab; Respiratory  Result Value Ref Range Status   Adenovirus NOT DETECTED NOT DETECTED Final   Coronavirus 229E NOT DETECTED NOT DETECTED Final    Comment: (NOTE) The Coronavirus on the Respiratory Panel, DOES NOT test for the novel  Coronavirus (2019 nCoV)    Coronavirus HKU1 NOT DETECTED NOT DETECTED Final   Coronavirus NL63 NOT DETECTED NOT DETECTED Final   Coronavirus OC43 NOT DETECTED NOT DETECTED Final   Metapneumovirus NOT DETECTED NOT DETECTED Final   Rhinovirus / Enterovirus NOT DETECTED NOT DETECTED Final   Influenza A NOT DETECTED NOT DETECTED Final  Influenza B NOT DETECTED NOT DETECTED Final   Parainfluenza Virus 1 NOT DETECTED NOT DETECTED Final   Parainfluenza Virus 2 NOT DETECTED NOT DETECTED Final   Parainfluenza Virus 3 NOT DETECTED NOT DETECTED Final   Parainfluenza Virus 4 NOT DETECTED NOT DETECTED Final   Respiratory Syncytial Virus NOT DETECTED NOT DETECTED Final   Bordetella pertussis NOT DETECTED NOT DETECTED Final   Bordetella Parapertussis NOT DETECTED NOT DETECTED Final   Chlamydophila pneumoniae NOT DETECTED NOT DETECTED Final   Mycoplasma pneumoniae NOT DETECTED NOT DETECTED Final    Comment: Performed at [facility], [address], [place], [place] [address]  MRSA Next Gen by PCR, Nasal     Status: None   Collection Time: 02/07/24  4:49 AM   Specimen: Nasal Mucosa; Nasal Swab  Result Value Ref  Range Status   MRSA by PCR Next Gen NOT DETECTED NOT DETECTED Final    Comment: (NOTE) The GeneXpert MRSA Assay (FDA approved for NASAL specimens only), is one component of a comprehensive MRSA colonization surveillance program. It is not intended to diagnose MRSA infection nor to guide or monitor treatment for MRSA infections. Test performance is not FDA approved in patients less than 82 years old. Performed at [facility], [address] [place], [place] [address]   Culture, blood (Routine X 2) w Reflex to ID Panel     Status: None   Collection Time: 02/07/24  6:11 AM   Specimen: BLOOD  Result Value Ref Range Status   Specimen Description BLOOD BLOOD RIGHT ARM  Final   Special Requests   Final    BOTTLES DRAWN AEROBIC AND ANAEROBIC Blood Culture results may not be optimal due to an inadequate volume of blood received in culture bottles   Culture   Final    NO GROWTH 5 DAYS Performed at [facility], [address] [place], [place] [address]    Report Status 02/12/2024 FINAL  Final  Culture, blood (Routine X 2) w Reflex to ID Panel     Status: None   Collection Time: 02/07/24  6:18 AM   Specimen: BLOOD  Result Value Ref Range Status   Specimen Description BLOOD BLOOD LEFT ARM  Final   Special Requests   Final    BOTTLES DRAWN AEROBIC AND ANAEROBIC Blood Culture adequate volume   Culture   Final    NO GROWTH 5 DAYS Performed at [facility], [address], [place], [place] [address]    Report Status 02/12/2024 FINAL  Final  Culture, Respiratory w Gram Stain     Status: None   Collection Time: 02/07/24 10:48 AM   Specimen: Tracheal Aspirate; Respiratory  Result Value Ref Range Status   Specimen Description   Final    TRACHEAL ASPIRATE Performed at [facility], [address], [place], [place] [address]    Special Requests   Final    NONE Performed at [facility], [address] [place], [place] [address]    Gram  Stain   Final    FEW WBC PRESENT, PREDOMINANTLY PMN RARE GRAM POSITIVE COCCI Performed at [facility], [address], [place], [place] [address]    Culture RARE STREPTOCOCCUS PNEUMONIAE  Final   Report Status 02/10/2024 FINAL  Final   Organism ID, Bacteria STREPTOCOCCUS PNEUMONIAE  Final      Susceptibility   Streptococcus pneumoniae - MIC*    ERYTHROMYCIN >=8 RESISTANT Resistant     LEVOFLOXACIN  0.5 SENSITIVE  Sensitive     VANCOMYCIN 0.5 SENSITIVE Sensitive     PENICILLIN (meningitis) 1 RESISTANT Resistant     PENO - penicillin 1      PENICILLIN (non-meningitis) 1 SENSITIVE Sensitive     PENICILLIN (oral) 1 INTERMEDIATE Intermediate     CEFTRIAXONE (non-meningitis) 1 SENSITIVE Sensitive     CEFTRIAXONE (meningitis) 1 INTERMEDIATE Intermediate     * RARE STREPTOCOCCUS PNEUMONIAE    Coagulation Studies: No results for input(s): LABPROT, INR in the last 72 hours.  Imaging: EEG adult Result Date: 02/10/2024 Shelton Arlin KIDD, MD     02/10/2024  1:55 PM Patient Name: Edward Weber MRN: [id number] Epilepsy Attending: Arlin KIDD Shelton Referring Physician/Provider: Terrace Space, PA-C Date: 02/10/2024 Duration: 30.30 mins Patient history: 63 year old male patient with a past medical history of hypertension, CVA, sickle cell anemia, GERD, polysubstance abuse including EtOH cocaine and marijuana presented to the ED for syncopal episode. EEG to evaluate for seizure Level of alertness: asleep, awake/ lethargic AEDs during EEG study: Valium Technical aspects: This EEG study was done with scalp electrodes positioned according to the 10-20 International system of electrode placement. Electrical activity was reviewed with band pass filter of 1-70Hz , sensitivity of 7 uV/mm, display speed of 63mm/sec with a 60Hz  notched filter applied as appropriate. EEG data were recorded continuously and digitally stored.  Video monitoring was available and reviewed as appropriate. Description: No clear  posterior dominant rhythm was seen. Sleep was characterized by vertex waves, sleep spindles (12 to 14 Hz), maximal frontocentral region. There is continuous generalized 3 to 6 Hz theta-delta slowing. Hyperventilation and photic stimulation were not performed.   ABNORMALITY - Continuous slow, generalized IMPRESSION: This study is suggestive of generalized cerebral dysfunction (encephalopathy). No seizures or epileptiform discharges were seen throughout the recording.  Priyanka O Yadav    Medications: Scheduled:  amLODipine  10 mg Per Tube Daily   B-complex with vitamin C  1 tablet Per Tube Daily   Chlorhexidine Gluconate Cloth  6 each Topical Daily   cyanocobalamin  1,000 mcg Intramuscular Daily   enoxaparin  (LOVENOX ) injection  1 mg/kg Subcutaneous Q12H   [START ON 02/13/2024] folic acid   1 mg Per Tube Daily   free water  30 mL Per Tube Q4H   insulin aspart  0-15 Units Subcutaneous Q4H   insulin glargine-yfgn  10 Units Subcutaneous Daily   ipratropium-albuterol   3 mL Nebulization TID   labetalol   200 mg Per Tube BID   multivitamin with minerals  1 tablet Oral Daily   nicotine  21 mg Transdermal Daily   mouth rinse  15 mL Mouth Rinse Q2H   pantoprazole  (PROTONIX ) IV  40 mg Intravenous Q12H   [START ON 02/14/2024] thiamine  (VITAMIN B1) injection  100 mg Intravenous Q24H    Assessment/Plan: 63 y.o. male with past medical history of hypertension, stroke, history of sickle cell anemia though not confirmed with blood testing, GERD polysubstance abuse who presented to the hospital on 10/24 with complaints of dizziness, syncope, SOB and exertional chest pain.  Patient then became confused/combative and was felt to be experiencing withdrawal symptoms.  Placed on IVC.  Patient further decompensated requiring sedation and intubation.  Patient has episodes of stiffening and shaking as well.  ? Posturing.  Consult called for further recommendations. MRI of the brain on 10/29 personally reviewed and reveals  subacute infarcts in the left fronto-parietal region as well as what appears to be a punctate left frontal infarct that seems more acute. All of these are  quite small and would not be expected to cause the current neurological examination.  Based on imaging though etiology does appear embolic.  Patient has had some fevers and elevated wbc count.  +PNA.  RUE DVT noted.  Multiple metabolic issues are present as well.  EEG only significant for slowing.   Mental status likely multifactorial but with such slow improvement would re-image to make sure patient is not continuing to shower emboli.  Source unclear. Echocardiogram was performed on 10/25 and shows no cardiac source of emboli with EF of 60-65%.  Would also rule out a cervical lesion since patient not moving upper extremities now  Recommendations: MRI of the brain and cervical spine with and without contrast   LOS: 8 days   Sonny Hock, MD Neurology  02/12/2024  11:29 AM

## 2024-02-12 NOTE — Progress Notes (Signed)
 RT assisted with patient transport to an MRI and Back to ICU with no complications. Pt transported on Servo-air and placed back on PRVC 450 x 20 28% and +5 PEEP for study. Once patient returned to ICU, pt placed back on PSV 5/5 and 28%. PT tol well at this time. Time for transport, 1Hour.

## 2024-02-13 DIAGNOSIS — F10931 Alcohol use, unspecified with withdrawal delirium: Secondary | ICD-10-CM | POA: Diagnosis not present

## 2024-02-13 DIAGNOSIS — R4182 Altered mental status, unspecified: Secondary | ICD-10-CM | POA: Diagnosis not present

## 2024-02-13 DIAGNOSIS — I10 Essential (primary) hypertension: Secondary | ICD-10-CM | POA: Diagnosis not present

## 2024-02-13 DIAGNOSIS — I6340 Cerebral infarction due to embolism of unspecified cerebral artery: Secondary | ICD-10-CM | POA: Diagnosis not present

## 2024-02-13 DIAGNOSIS — I82601 Acute embolism and thrombosis of unspecified veins of right upper extremity: Secondary | ICD-10-CM

## 2024-02-13 DIAGNOSIS — J690 Pneumonitis due to inhalation of food and vomit: Secondary | ICD-10-CM | POA: Diagnosis not present

## 2024-02-13 DIAGNOSIS — R079 Chest pain, unspecified: Secondary | ICD-10-CM | POA: Diagnosis not present

## 2024-02-13 DIAGNOSIS — G9341 Metabolic encephalopathy: Secondary | ICD-10-CM | POA: Diagnosis not present

## 2024-02-13 LAB — RENAL FUNCTION PANEL
Albumin: 2.5 g/dL — ABNORMAL LOW (ref 3.5–5.0)
Anion gap: 11 (ref 5–15)
BUN: 39 mg/dL — ABNORMAL HIGH (ref 8–23)
CO2: 24 mmol/L (ref 22–32)
Calcium: 8.9 mg/dL (ref 8.9–10.3)
Chloride: 109 mmol/L (ref 98–111)
Creatinine, Ser: 1.11 mg/dL (ref 0.61–1.24)
GFR, Estimated: >60 mL/min (ref >60)
Glucose, Bld: 255 mg/dL — ABNORMAL HIGH (ref 70–99)
Phosphorus: 3.9 mg/dL (ref 2.5–4.6)
Potassium: 4.6 mmol/L (ref 3.5–5.1)
Sodium: 144 mmol/L (ref 135–145)

## 2024-02-13 LAB — GLUCOSE, CAPILLARY
Glucose-Capillary: 222 mg/dL — ABNORMAL HIGH (ref 70–99)
Glucose-Capillary: 219 mg/dL — ABNORMAL HIGH (ref 70–99)
Glucose-Capillary: 142 mg/dL — ABNORMAL HIGH (ref 70–99)
Glucose-Capillary: 114 mg/dL — ABNORMAL HIGH (ref 70–99)
Glucose-Capillary: 152 mg/dL — ABNORMAL HIGH (ref 70–99)
Glucose-Capillary: 163 mg/dL — ABNORMAL HIGH (ref 70–99)

## 2024-02-13 LAB — CBC
HCT: 27.5 % — ABNORMAL LOW (ref 39.0–52.0)
Hemoglobin: 8.8 g/dL — ABNORMAL LOW (ref 13.0–17.0)
MCH: 30.4 pg (ref 26.0–34.0)
MCHC: 32 g/dL (ref 30.0–36.0)
MCV: 95.2 fL (ref 80.0–100.0)
Platelets: 331 K/uL (ref 150–400)
RBC: 2.89 MIL/uL — ABNORMAL LOW (ref 4.22–5.81)
RDW: 15.3 % (ref 11.5–15.5)
WBC: 11.1 K/uL — ABNORMAL HIGH (ref 4.0–10.5)
nRBC: 0 % (ref 0.0–0.2)

## 2024-02-13 LAB — MAGNESIUM: Magnesium: 2.2 mg/dL (ref 1.7–2.4)

## 2024-02-13 MED ORDER — IPRATROPIUM-ALBUTEROL 0.5-2.5 (3) MG/3ML IN SOLN
3.0000 mL | Freq: Two times a day (BID) | RESPIRATORY_TRACT | Status: DC
  Administered 2024-02-13 – 2024-02-14 (×2): 3 mL via RESPIRATORY_TRACT
  Filled 2024-02-13 (×2): qty 3

## 2024-02-13 MED ORDER — LABETALOL HCL 200 MG PO TABS
200.0000 mg | ORAL_TABLET | Freq: Two times a day (BID) | ORAL | Status: DC
  Administered 2024-02-14 – 2024-02-16 (×4): 200 mg via ORAL
  Filled 2024-02-13 (×4): qty 1

## 2024-02-13 MED ORDER — AMLODIPINE BESYLATE 10 MG PO TABS
10.0000 mg | ORAL_TABLET | Freq: Every day | ORAL | Status: DC
  Administered 2024-02-15 – 2024-02-22 (×8): 10 mg via ORAL
  Filled 2024-02-13 (×8): qty 1

## 2024-02-13 MED ORDER — B COMPLEX-C PO TABS
1.0000 | ORAL_TABLET | Freq: Every day | ORAL | Status: DC
Start: 2024-02-14 — End: 2024-03-11
  Administered 2024-02-15 – 2024-02-16 (×2): 1 via ORAL
  Filled 2024-02-13 (×3): qty 1

## 2024-02-13 MED ORDER — ORAL CARE MOUTH RINSE
15.0000 mL | OROMUCOSAL | Status: DC
  Administered 2024-02-13 (×2): 15 mL via OROMUCOSAL

## 2024-02-13 MED ORDER — FOLIC ACID 1 MG PO TABS
1.0000 mg | ORAL_TABLET | Freq: Every day | ORAL | Status: DC
  Administered 2024-02-15 – 2024-02-22 (×8): 1 mg via ORAL
  Filled 2024-02-13 (×8): qty 1

## 2024-02-13 MED ORDER — ORAL CARE MOUTH RINSE
15.0000 mL | OROMUCOSAL | Status: DC | PRN
  Administered 2024-02-13: 15 mL via OROMUCOSAL

## 2024-02-13 NOTE — Progress Notes (Signed)
 Telephone call received by this RN, requesting update on the patients status. Password was requested and Dannitra was the password received. Family/friend was notified that is not the correct password and no information can be given if family/friend doesn't know the password. Password and contacts had been updated 02/12/2024 by the patients daughter during visiting hours.

## 2024-02-13 NOTE — Progress Notes (Signed)
 Patient extubated to 2L [place], Per MD order. Sats 98% and tolerating well.

## 2024-02-13 NOTE — Progress Notes (Signed)
 SLP Cancellation Note  Patient Details Name: Edward Weber MRN: [id number] DOB: 05/11/1960   Cancelled treatment:       Reason Eval/Treat Not Completed: Medical issues which prohibited therapy (SLP consult received and appreciated. Chart review completed. Of note, pt with recent extubation (~2 hours ago) following prolonged and emergent intubation (8 days; 10/26-11/2). Will proceed with clinical swallowing evaluation next date with consideration for MBSS.)  Edward Weber, M.S., CCC-SLP Speech-Language Pathologist [facility] [phone] [place])  Edward Weber 02/13/2024, 10:21 AM

## 2024-02-13 NOTE — Progress Notes (Signed)
 NAME:  Edward Weber, MRN:  [id number], DOB:  Aug 19, 1960, LOS: 9 ADMISSION DATE:  02/04/2024 History of Present Illness:  63 y.o male with significant PMH of HTN, CVA, sickle cell anemia, GERD, polysubstance abuse (EtOH abuse disorder, cocaine, marijuana tobacco abuse), and recurrent syncope who presented to the ED with chief complaints of syncopal episode. Per chart review, he has been having exertional chest pain, DOP for the past few days and today felt dizzy and passed out without hitting his head.   ED Course: Initial vital signs showed HR of 84beats/minute, BP203/102 mm Hg, the RR 20 breaths/minute, 98%O2 Sat on and Temp of 98.1F (36.7C).    Pertinent Labs/Diagnostics Findings: Na+/ K+:141/4.0 Glucose:166  WBC:7.3 K/L Hgb/Hct:11.5/34.9  COVID PCR: Negative,  UDS+ Marijuana CXR> CTH> CTA Chest> CT Abd/pelvis>negative for acute abnormality   Admitted to TRH service for further management of syncopal episode. SEE SIGNIFICANT HOSPITAL EVENTS BELOW.  Pertinent  Medical History    Alcoholism (HCC)     Allergic rhinitis     Allergic state     Anemia     Arthritis     Cancer (HCC)     Chronic hepatitis C (HCC)     Depression     Esophagitis, reflux     GERD (gastroesophageal reflux disease)     Gout     Helicobacter pylori gastritis     Hepatitis     Hypertension     Neuromuscular disorder (HCC)     Osteoarthrosis     Peripheral neuropathy     Rhabdomyolysis     Sickle cell anemia (HCC)     Stroke [facility])     Stroke [facility])     Vitiligo     Significant Hospital Events: Including procedures, antibiotic start and stop dates in addition to other pertinent events   10/24: Admitted to TRH service for syncopal workup.  Psych consulted for agitation and threatening to leave AMA. Due to history of polysubstance abuse and AMS patient was IVCd 10/25: Remains IVC due to agitation and delirium.  Phenobarb taper added per primary team. 10/26: Transferred to the ICU for worsening  somnolent and increased work of breathing requiring emergent intubation. 10/27: Remains critically ill requiring mechanical ventilation. Agitation requiring propofol . Remains on ceftriaxone for aspiration pna coverage. 10/28: Pt remains mechanically intubated unable to liberate from ventilator due to poor neurological exam and excessive secretions.  Pending MRI Brain  10/30: Remains critically ill requiring mechanically intubation. Extubation precluded due to neurological status. MRI found subacute infarcts. Will consult Neuro for further evaluation and order an EEG.  Interim History / Subjective:  -- Patient seen and evaluated today.  -- He is a much more awake. Opens his eyes to voice commands. Moves his feet to commands. But not moving his upper extremities much.  -- MR cervical spine and brain 11/1 without any acute findings.  -- RUE DVT started on heparin  drip yesterday. Switch to lovenox .  -- EEG without any seizure activity.  -- Doing well on SBT Objective    Blood pressure (!) 145/88, pulse 90, temperature 99.1 F (37.3 C), temperature source Oral, resp. rate 18, height 5' 8 (1.727 m), weight 84.1 kg, SpO2 98%.    Vent Mode: PSV FiO2 (%):  [28 %] 28 % PEEP:  [5 cmH20] 5 cmH20 Pressure Support:  [5 cmH20] 5 cmH20   Intake/Output Summary (Last 24 hours) at 02/13/2024 0944 Last data filed at 02/13/2024 0902 Gross per 24 hour  Intake 1893.93 ml  Output 1400 ml  Net 493.93 ml   Filed Weights   02/09/24 0500 02/12/24 0428 02/13/24 0500  Weight: 86 kg 85.5 kg 84.1 kg    Examination: General: Intubated, off sedation, opens his eyes to voice commands. HENT: Supple neck, reactive pupils  Lungs: clear bilateral air entry  Cardiovascular: Normal S1, Normal S2, RRR  Abdomen: Soft, non tender, non distended + BS  Extremities: Warm and well perfused.   Labs and imaging were reviewed.   Assessment and Plan  Edward Weber is a 63 year old male patient with a past medical history of  hypertension, CVA, sickle cell anemia, GERD, polysubstance abuse including EtOH cocaine and marijuana presented to the ED for syncopal episode.  He was intubated for airway protection on 10/26.  Chest x-ray without any significant findings.  He is being managed for alcohol withdrawal and Strep pneumonia.    More awake today, opens his eyes to voice. Extremely weak.   MRI brian 10/29 without any significant acute findings that could explain his current ongoing encephalopathy. EEG without any seizure activity. WBC downtrending. MR cervical 11/1 without any acute findings. Proceed with extubation. Appreciate neuro recs. Unclear reason behind his profound weakness and inability to move his upper extremities. Possibly ccm myopathy.    Respiratory culture growing strep pneumo - C/w Ceftriaxone for 5 more days for a total of 10 days.   Critical care time: 38 minutes     Darrin Barn, MD [facility] 02/13/2024 9:44 AM

## 2024-02-13 NOTE — Progress Notes (Signed)
   02/13/24 0910  Spiritual Encounters  Type of Visit Initial  Care provided to: Pt and family  Referral source Chaplain assessment  Reason for visit Routine spiritual support  Interventions  Spiritual Care Interventions Made Established relationship of care and support;Compassionate presence  Intervention Outcomes  Outcomes Connection to spiritual care  Spiritual Care Plan  Spiritual Care Issues Still Outstanding No further spiritual care needs at this time (see row info)   Chaplain stepped in to speak with daughter in ICU waiting room and then went into room to say hello.

## 2024-02-13 NOTE — Progress Notes (Signed)
 Subjective: Patient extubated.  Awake and alert.  Follows commands.    Objective: Current vital signs: BP (!) 151/78 (BP Location: Left Arm)   Pulse 88   Temp 99.8 F (37.7 C) (Axillary)   Resp 17   Ht 5' 8 (1.727 m)   Wt 84.1 kg   SpO2 97%   BMI 28.19 kg/m  Vital signs in last 24 hours: Temp:  [99 F (37.2 C)-99.9 F (37.7 C)] 99.8 F (37.7 C) (11/02 1130) Pulse Rate:  [81-97] 88 (11/02 1130) Resp:  [15-26] 17 (11/02 1130) BP: (116-172)/(64-100) 151/78 (11/02 1130) SpO2:  [94 %-98 %] 97 % (11/02 1130) FiO2 (%):  [28 %] 28 % (11/02 0400) Weight:  [84.1 kg] 84.1 kg (11/02 0500)  Intake/Output from previous day: 11/01 0701 - 11/02 0700 In: 1942.7 [I.V.:64.7; NG/GT:1620; IV Piggyback:198] Out: 1650 [Urine:1450; Stool:200] Intake/Output this shift: Total I/O In: 85 [NG/GT:30; IV Piggyback:55] Out: 300 [Urine:300] Nutritional status:  Diet Order             Diet NPO time specified Except for: Other (See Comments)  Diet effective now                   Neurologic Exam: Mental Status: Eyes open.  Alert.  Holds fluent and appropriate conversation.  Follows commands.   Cranial Nerves: II: VFs grossly intact III,IV,VI: EOMs intact V,VII: smile symmetric Motor: Moves all extremities against gravity although weakly, no focal weakness appreciated Sensory: Responds to noxious stimuli in all extremities  Lab Results: Basic Metabolic Panel: Recent Labs  Lab 02/09/24 0443 02/10/24 0439 02/11/24 0614 02/12/24 0535 02/13/24 0340  NA 142 142 143 140 144  K 4.2 4.6 4.3 4.3 4.6  CL 107 109 109 107 109  CO2 21* 24 23 23 24   GLUCOSE 174* 248* 230* 233* 255*  BUN 25* 32* 35* 36* 39*  CREATININE 1.06 1.15 1.01 1.12 1.11  CALCIUM  8.3* 8.6* 8.5* 8.7* 8.9  MG 2.2 2.4 2.3 2.3 2.2  PHOS 2.8 3.3 2.9 3.6 3.9    Liver Function Tests: Recent Labs  Lab 02/07/24 0202 02/13/24 0340  AST 22  --   ALT 17  --   ALKPHOS 36*  --   BILITOT 1.0  --   PROT 7.4  --    ALBUMIN 3.5 2.5*   No results for input(s): LIPASE, AMYLASE in the last 168 hours. Recent Labs  Lab 02/07/24 0907 02/10/24 0957  AMMONIA 19 <13    CBC: Recent Labs  Lab 02/09/24 0443 02/10/24 0439 02/11/24 0614 02/12/24 0535 02/13/24 0340  WBC 13.0* 12.4* 11.9* 13.0* 11.1*  HGB 10.0* 9.9* 9.7* 9.8* 8.8*  HCT 30.9* 30.8* 30.1* 30.7* 27.5*  MCV 96.9 96.9 96.2 95.9 95.2  PLT 226 268 269 332 331    Cardiac Enzymes: No results for input(s): CKTOTAL, CKMB, CKMBINDEX, TROPONINI in the last 168 hours.  Lipid Panel: Recent Labs  Lab 02/07/24 0202  TRIG 206*    CBG: Recent Labs  Lab 02/12/24 1942 02/12/24 2334 02/13/24 0338 02/13/24 0822 02/13/24 1134  GLUCAP 257* 186* 222* 219* 142*    Microbiology: Results for orders placed or performed during the hospital encounter of 02/04/24  Resp panel by RT-PCR (RSV, Flu A&B, Covid) Anterior Nasal Swab     Status: None   Collection Time: 02/04/24  2:55 AM   Specimen: Anterior Nasal Swab  Result Value Ref Range Status   SARS Coronavirus 2 by RT PCR NEGATIVE NEGATIVE Final    Comment: (  NOTE) SARS-CoV-2 target nucleic acids are NOT DETECTED.  The SARS-CoV-2 RNA is generally detectable in upper respiratory specimens during the acute phase of infection. The lowest concentration of SARS-CoV-2 viral copies this assay can detect is 138 copies/mL. A negative result does not preclude SARS-Cov-2 infection and should not be used as the sole basis for treatment or other patient management decisions. A negative result may occur with  improper specimen collection/handling, submission of specimen other than nasopharyngeal swab, presence of viral mutation(s) within the areas targeted by this assay, and inadequate number of viral copies(<138 copies/mL). A negative result must be combined with clinical observations, patient history, and epidemiological information. The expected result is Negative.  Fact Sheet for Patients:   bloggercourse.com  Fact Sheet for Healthcare Providers:  seriousbroker.it  This test is no t yet approved or cleared by the [place]  FDA and  has been authorized for detection and/or diagnosis of SARS-CoV-2 by FDA under an Emergency Use Authorization (EUA). This EUA will remain  in effect (meaning this test can be used) for the duration of the COVID-19 declaration under Section 564(b)(1) of the Act, 21 U.S.C.section 360bbb-3(b)(1), unless the authorization is terminated  or revoked sooner.       Influenza A by PCR NEGATIVE NEGATIVE Final   Influenza B by PCR NEGATIVE NEGATIVE Final    Comment: (NOTE) The Xpert Xpress SARS-CoV-2/FLU/RSV plus assay is intended as an aid in the diagnosis of influenza from Nasopharyngeal swab specimens and should not be used as a sole basis for treatment. Nasal washings and aspirates are unacceptable for Xpert Xpress SARS-CoV-2/FLU/RSV testing.  Fact Sheet for Patients: bloggercourse.com  Fact Sheet for Healthcare Providers: seriousbroker.it  This test is not yet approved or cleared by the [place]  FDA and has been authorized for detection and/or diagnosis of SARS-CoV-2 by FDA under an Emergency Use Authorization (EUA). This EUA will remain in effect (meaning this test can be used) for the duration of the COVID-19 declaration under Section 564(b)(1) of the Act, 21 U.S.C. section 360bbb-3(b)(1), unless the authorization is terminated or revoked.     Resp Syncytial Virus by PCR NEGATIVE NEGATIVE Final    Comment: (NOTE) Fact Sheet for Patients: bloggercourse.com  Fact Sheet for Healthcare Providers: seriousbroker.it  This test is not yet approved or cleared by the [place]  FDA and has been authorized for detection and/or diagnosis of SARS-CoV-2 by FDA under an Emergency Use  Authorization (EUA). This EUA will remain in effect (meaning this test can be used) for the duration of the COVID-19 declaration under Section 564(b)(1) of the Act, 21 U.S.C. section 360bbb-3(b)(1), unless the authorization is terminated or revoked.  Performed at [facility], [address] [place], [place] [address]   Respiratory (~20 pathogens) panel by PCR     Status: None   Collection Time: 02/07/24  4:49 AM   Specimen: Nasopharyngeal Swab; Respiratory  Result Value Ref Range Status   Adenovirus NOT DETECTED NOT DETECTED Final   Coronavirus 229E NOT DETECTED NOT DETECTED Final    Comment: (NOTE) The Coronavirus on the Respiratory Panel, DOES NOT test for the novel  Coronavirus (2019 nCoV)    Coronavirus HKU1 NOT DETECTED NOT DETECTED Final   Coronavirus NL63 NOT DETECTED NOT DETECTED Final   Coronavirus OC43 NOT DETECTED NOT DETECTED Final   Metapneumovirus NOT DETECTED NOT DETECTED Final   Rhinovirus / Enterovirus NOT DETECTED NOT DETECTED Final   Influenza A NOT DETECTED NOT DETECTED Final   Influenza B NOT DETECTED NOT  DETECTED Final   Parainfluenza Virus 1 NOT DETECTED NOT DETECTED Final   Parainfluenza Virus 2 NOT DETECTED NOT DETECTED Final   Parainfluenza Virus 3 NOT DETECTED NOT DETECTED Final   Parainfluenza Virus 4 NOT DETECTED NOT DETECTED Final   Respiratory Syncytial Virus NOT DETECTED NOT DETECTED Final   Bordetella pertussis NOT DETECTED NOT DETECTED Final   Bordetella Parapertussis NOT DETECTED NOT DETECTED Final   Chlamydophila pneumoniae NOT DETECTED NOT DETECTED Final   Mycoplasma pneumoniae NOT DETECTED NOT DETECTED Final    Comment: Performed at [facility], [address] [place], [place] [address]  MRSA Next Gen by PCR, Nasal     Status: None   Collection Time: 02/07/24  4:49 AM   Specimen: Nasal Mucosa; Nasal Swab  Result Value Ref Range Status   MRSA by PCR Next Gen NOT DETECTED NOT DETECTED Final    Comment: (NOTE) The  GeneXpert MRSA Assay (FDA approved for NASAL specimens only), is one component of a comprehensive MRSA colonization surveillance program. It is not intended to diagnose MRSA infection nor to guide or monitor treatment for MRSA infections. Test performance is not FDA approved in patients less than 35 years old. Performed at [facility], [address] [place], [place] [address]   Culture, blood (Routine X 2) w Reflex to ID Panel     Status: None   Collection Time: 02/07/24  6:11 AM   Specimen: BLOOD  Result Value Ref Range Status   Specimen Description BLOOD BLOOD RIGHT ARM  Final   Special Requests   Final    BOTTLES DRAWN AEROBIC AND ANAEROBIC Blood Culture results may not be optimal due to an inadequate volume of blood received in culture bottles   Culture   Final    NO GROWTH 5 DAYS Performed at [facility], [address] [place], [place] [address]    Report Status 02/12/2024 FINAL  Final  Culture, blood (Routine X 2) w Reflex to ID Panel     Status: None   Collection Time: 02/07/24  6:18 AM   Specimen: BLOOD  Result Value Ref Range Status   Specimen Description BLOOD BLOOD LEFT ARM  Final   Special Requests   Final    BOTTLES DRAWN AEROBIC AND ANAEROBIC Blood Culture adequate volume   Culture   Final    NO GROWTH 5 DAYS Performed at [facility], [address] [place], [place] [address]    Report Status 02/12/2024 FINAL  Final  Culture, Respiratory w Gram Stain     Status: None   Collection Time: 02/07/24 10:48 AM   Specimen: Tracheal Aspirate; Respiratory  Result Value Ref Range Status   Specimen Description   Final    TRACHEAL ASPIRATE Performed at [facility], [address], [place], [place] [address]    Special Requests   Final    NONE Performed at [facility], [address] [place], [place] [address]    Gram Stain   Final    FEW WBC PRESENT, PREDOMINANTLY PMN RARE GRAM POSITIVE COCCI Performed at  [facility], [address] [place], [place] [address]    Culture RARE STREPTOCOCCUS PNEUMONIAE  Final   Report Status 02/10/2024 FINAL  Final   Organism ID, Bacteria STREPTOCOCCUS PNEUMONIAE  Final      Susceptibility   Streptococcus pneumoniae - MIC*    ERYTHROMYCIN >=8 RESISTANT Resistant     LEVOFLOXACIN  0.5 SENSITIVE Sensitive  VANCOMYCIN 0.5 SENSITIVE Sensitive     PENICILLIN (meningitis) 1 RESISTANT Resistant     PENO - penicillin 1      PENICILLIN (non-meningitis) 1 SENSITIVE Sensitive     PENICILLIN (oral) 1 INTERMEDIATE Intermediate     CEFTRIAXONE (non-meningitis) 1 SENSITIVE Sensitive     CEFTRIAXONE (meningitis) 1 INTERMEDIATE Intermediate     * RARE STREPTOCOCCUS PNEUMONIAE    Coagulation Studies: No results for input(s): LABPROT, INR in the last 72 hours.  Imaging: MR CERVICAL SPINE W WO CONTRAST Result Date: 02/12/2024 EXAM: MRI BRAIN WITHOUT AND WITH CONTRAST MRI CERVICAL SPINE WITHOUT AND WITH CONTRAST 02/12/2024 03:36:28 PM TECHNIQUE: Multiplanar multisequence MRI of the brain was performed without and with the administration of intravenous contrast. Multiplanar multisequence MRI of the cervical spine was performed without and with the administration of intravenous contrast. 7.5 mL of gadobutrol (GADAVIST) 1 MMOL/ML injection was administered. COMPARISON: Cervical spine MRI dated 08/24/2023 and brain MRI dated 05/11/2022. CLINICAL HISTORY: Mental status change, persistent or worsening. FINDINGS: MRI BRAIN: BRAIN AND VENTRICLES: Linear focus of hyperintensity on diffusion weighted imaging within the left centrum semiovale (series 9 image 22) without a corresponding ADC deficit, possibly late subacute ischemia. Chronic blood products of the right basal ganglia and external capsule. Multifocal hyperintense T2-weighted signal within the cerebral white matter, most commonly due to chronic small vessel disease. Mild volume loss. No acute infarct. No acute  intracranial hemorrhage. No mass or abnormal enhancement. No midline shift. No hydrocephalus. The sella is unremarkable. Normal flow voids. ORBITS: No acute abnormality. SINUSES AND MASTOIDS: No acute abnormality. BONES AND SOFT TISSUES: Normal bone marrow signal. No acute soft tissue abnormality. LIMITATIONS/ARTIFACTS: Multiple sequences are degraded by motion. MRI CERVICAL SPINE: BONES AND ALIGNMENT: C4 corpectomy with interbody cage and anterior fusion at C3-C5, unchanged. Grade 1 anterolisthesis at C7-T1. Normal vertebral body heights. Marrow signal is unremarkable. No abnormal enhancement. SPINAL CORD: Normal spinal cord size. Normal spinal cord signal. SOFT TISSUES: Unremarkable. C2-C3: Intermediate size disc osteophyte complex with mild spinal canal stenosis and moderate bilateral foraminal stenosis, unchanged. C3-C4: Postsurgical changes. No spinal canal or neural foraminal stenosis. C4-C5: Postsurgical changes, unchanged. Severe bilateral foraminal stenosis. No spinal canal stenosis. C5-C6: Small disc bulge with endplate spurring, unchanged. Mild spinal canal stenosis, unchanged. Severe bilateral foraminal stenosis. C6-C7: Small disc bulge with endplate spurring, unchanged. Severe bilateral foraminal stenosis. Mild central spinal canal stenosis. C7-T1: Grade 1 anterolisthesis. No significant disc herniation. No spinal canal stenosis or neural foraminal narrowing. IMPRESSION: 1. Linear focus of diffusion-weighted hyperintensity in the left centrum semiovale without corresponding ADC reduction, possibly late subacute ischemia. 2. Severe bilateral foraminal stenosis at C4-5 without spinal canal stenosis, unchanged . 3. Severe bilateral foraminal stenosis at C5-6 with mild spinal canal stenosis, unchanged . 4. Severe bilateral foraminal stenosis at C6-7 with mild central spinal canal stenosis, unchanged . 5. C4 corpectomy with interbody cage and anterior fusion at C3-5. Electronically signed by: Franky Stanford MD  02/12/2024 04:19 PM EDT RP Workstation: [id number]   MR BRAIN W WO CONTRAST Result Date: 02/12/2024 EXAM: MRI BRAIN WITHOUT AND WITH CONTRAST MRI CERVICAL SPINE WITHOUT AND WITH CONTRAST 02/12/2024 03:36:28 PM TECHNIQUE: Multiplanar multisequence MRI of the brain was performed without and with the administration of intravenous contrast. Multiplanar multisequence MRI of the cervical spine was performed without and with the administration of intravenous contrast. 7.5 mL of gadobutrol (GADAVIST) 1 MMOL/ML injection was administered. COMPARISON: Cervical spine MRI dated 08/24/2023 and brain MRI dated 05/11/2022. CLINICAL HISTORY: Mental status change,  persistent or worsening. FINDINGS: MRI BRAIN: BRAIN AND VENTRICLES: Linear focus of hyperintensity on diffusion weighted imaging within the left centrum semiovale (series 9 image 22) without a corresponding ADC deficit, possibly late subacute ischemia. Chronic blood products of the right basal ganglia and external capsule. Multifocal hyperintense T2-weighted signal within the cerebral white matter, most commonly due to chronic small vessel disease. Mild volume loss. No acute infarct. No acute intracranial hemorrhage. No mass or abnormal enhancement. No midline shift. No hydrocephalus. The sella is unremarkable. Normal flow voids. ORBITS: No acute abnormality. SINUSES AND MASTOIDS: No acute abnormality. BONES AND SOFT TISSUES: Normal bone marrow signal. No acute soft tissue abnormality. LIMITATIONS/ARTIFACTS: Multiple sequences are degraded by motion. MRI CERVICAL SPINE: BONES AND ALIGNMENT: C4 corpectomy with interbody cage and anterior fusion at C3-C5, unchanged. Grade 1 anterolisthesis at C7-T1. Normal vertebral body heights. Marrow signal is unremarkable. No abnormal enhancement. SPINAL CORD: Normal spinal cord size. Normal spinal cord signal. SOFT TISSUES: Unremarkable. C2-C3: Intermediate size disc osteophyte complex with mild spinal canal stenosis and moderate  bilateral foraminal stenosis, unchanged. C3-C4: Postsurgical changes. No spinal canal or neural foraminal stenosis. C4-C5: Postsurgical changes, unchanged. Severe bilateral foraminal stenosis. No spinal canal stenosis. C5-C6: Small disc bulge with endplate spurring, unchanged. Mild spinal canal stenosis, unchanged. Severe bilateral foraminal stenosis. C6-C7: Small disc bulge with endplate spurring, unchanged. Severe bilateral foraminal stenosis. Mild central spinal canal stenosis. C7-T1: Grade 1 anterolisthesis. No significant disc herniation. No spinal canal stenosis or neural foraminal narrowing. IMPRESSION: 1. Linear focus of diffusion-weighted hyperintensity in the left centrum semiovale without corresponding ADC reduction, possibly late subacute ischemia. 2. Severe bilateral foraminal stenosis at C4-5 without spinal canal stenosis, unchanged . 3. Severe bilateral foraminal stenosis at C5-6 with mild spinal canal stenosis, unchanged . 4. Severe bilateral foraminal stenosis at C6-7 with mild central spinal canal stenosis, unchanged . 5. C4 corpectomy with interbody cage and anterior fusion at C3-5. Electronically signed by: Franky Stanford MD 02/12/2024 04:19 PM EDT RP Workstation: [id number]    Medications: Scheduled:  [START ON 02/14/2024] amLODipine  10 mg Oral Daily   [START ON 02/14/2024] B-complex with vitamin C  1 tablet Oral Daily   Chlorhexidine Gluconate Cloth  6 each Topical Daily   cyanocobalamin  1,000 mcg Intramuscular Daily   enoxaparin  (LOVENOX ) injection  1 mg/kg Subcutaneous Q12H   [START ON 02/14/2024] folic acid   1 mg Oral Daily   insulin aspart  0-15 Units Subcutaneous Q4H   insulin glargine-yfgn  10 Units Subcutaneous Daily   ipratropium-albuterol   3 mL Nebulization BID   labetalol   200 mg Oral BID   multivitamin with minerals  1 tablet Oral Daily   nicotine  21 mg Transdermal Daily   mouth rinse  15 mL Mouth Rinse 4 times per day   pantoprazole  (PROTONIX ) IV  40 mg Intravenous  Q12H   [START ON 02/14/2024] thiamine  (VITAMIN B1) injection  100 mg Intravenous Q24H    Assessment/Plan: 63 y.o. male with past medical history of hypertension, stroke, history of sickle cell anemia though not confirmed with blood testing, GERD polysubstance abuse who presented to the hospital on 10/24 with complaints of dizziness, syncope, SOB and exertional chest pain.  Patient then became confused/combative and was felt to be experiencing withdrawal symptoms.  Placed on IVC.  Patient further decompensated requiring sedation and intubation.  Patient had episodes of stiffening and shaking as well.  ? Posturing.  Consult called for further recommendations. MRI of the brain on 10/29 personally reviewed and reveals  subacute infarcts in the left fronto-parietal region as well as what appears to be a punctate left frontal infarct that seems more acute. All of these are quite small and would not be expected to cause the current neurological examination.  Based on imaging though etiology does appear embolic.  Patient has had some fevers and elevated wbc count.  +PNA.  RUE DVT noted.  Multiple metabolic issues are present as well.  EEG only significant for slowing.  Due to continued depressed mental status and generalized weakness.  MRI of the brain was repeated.  MRI was personally reviewed and reveals diffuse weighted hyperintensity in the left centrum semiovale but no other acute findings.  MRI of the cervical spine reviewed and although shows multilevel severe bilateral foraminal stenosis there was no evidence of cord compromise.  Patient now extubated-improved.  Awake and alert.  Currently on heparin  for DVT.   A1c 8.1  Recommendations: Once patient off anticoagulation will need to initiate ASA 81mg  daily Carotid dopplers Blood sugar management with target A1c<7.0 Fasting lipid panel with goal LDL<70.  Once appropriate would initiate high intensity statin    LOS: 9 days   Sonny Hock,  MD Neurology  02/13/2024  12:48 PM

## 2024-02-13 NOTE — Consult Note (Signed)
 Patient is non verbal at this time, he was extubated this morning. Will continue to monitor need for evaluation. Initial reason for consult was due to refusal of care and aggression. According to RN, patient has not been aggressive or refusing care.

## 2024-02-13 NOTE — Plan of Care (Signed)
  Problem: Clinical Measurements: Goal: Ability to maintain clinical measurements within normal limits will improve Outcome: Progressing Goal: Diagnostic test results will improve Outcome: Progressing Goal: Respiratory complications will improve Outcome: Progressing Goal: Cardiovascular complication will be avoided Outcome: Progressing   Problem: Nutrition: Goal: Adequate nutrition will be maintained Outcome: Progressing   Problem: Coping: Goal: Level of anxiety will decrease Outcome: Progressing   Problem: Pain Managment: Goal: General experience of comfort will improve and/or be controlled Outcome: Progressing   Problem: Safety: Goal: Ability to remain free from injury will improve Outcome: Progressing

## 2024-02-14 ENCOUNTER — Inpatient Hospital Stay

## 2024-02-14 DIAGNOSIS — F10931 Alcohol use, unspecified with withdrawal delirium: Secondary | ICD-10-CM | POA: Diagnosis not present

## 2024-02-14 LAB — BASIC METABOLIC PANEL WITH GFR
Anion gap: 14 (ref 5–15)
BUN: 39 mg/dL — ABNORMAL HIGH (ref 8–23)
CO2: 24 mmol/L (ref 22–32)
Calcium: 9.2 mg/dL (ref 8.9–10.3)
Chloride: 106 mmol/L (ref 98–111)
Creatinine, Ser: 1.09 mg/dL (ref 0.61–1.24)
GFR, Estimated: >60 mL/min (ref >60)
Glucose, Bld: 143 mg/dL — ABNORMAL HIGH (ref 70–99)
Potassium: 4.4 mmol/L (ref 3.5–5.1)
Sodium: 144 mmol/L (ref 135–145)

## 2024-02-14 LAB — CBC
HCT: 30.5 % — ABNORMAL LOW (ref 39.0–52.0)
Hemoglobin: 9.7 g/dL — ABNORMAL LOW (ref 13.0–17.0)
MCH: 30.4 pg (ref 26.0–34.0)
MCHC: 31.8 g/dL (ref 30.0–36.0)
MCV: 95.6 fL (ref 80.0–100.0)
Platelets: 420 K/uL — ABNORMAL HIGH (ref 150–400)
RBC: 3.19 MIL/uL — ABNORMAL LOW (ref 4.22–5.81)
RDW: 14.8 % (ref 11.5–15.5)
WBC: 13.4 K/uL — ABNORMAL HIGH (ref 4.0–10.5)
nRBC: 0.2 % (ref 0.0–0.2)

## 2024-02-14 LAB — MAGNESIUM: Magnesium: 2.3 mg/dL (ref 1.7–2.4)

## 2024-02-14 LAB — GLUCOSE, CAPILLARY
Glucose-Capillary: 149 mg/dL — ABNORMAL HIGH (ref 70–99)
Glucose-Capillary: 140 mg/dL — ABNORMAL HIGH (ref 70–99)
Glucose-Capillary: 148 mg/dL — ABNORMAL HIGH (ref 70–99)
Glucose-Capillary: 133 mg/dL — ABNORMAL HIGH (ref 70–99)
Glucose-Capillary: 148 mg/dL — ABNORMAL HIGH (ref 70–99)

## 2024-02-14 NOTE — Evaluation (Signed)
 Clinical/Bedside Swallow Evaluation Patient Details  Name: Edward Weber MRN: [id number] Date of Birth: June 09, 1960  Today's Date: 02/14/2024 Time: SLP Start Time (ACUTE ONLY): 1105 SLP Stop Time (ACUTE ONLY): 1120 SLP Time Calculation (min) (ACUTE ONLY): 15 min  Past Medical History:  Past Medical History:  Diagnosis Date   Alcoholism (HCC)    Allergic rhinitis    Allergic state    Anemia    Arthritis    Cancer (HCC)    Chronic hepatitis C (HCC)    Depression    Esophagitis, reflux    GERD (gastroesophageal reflux disease)    Gout    Helicobacter pylori gastritis    Hepatitis    Hypertension    Neuromuscular disorder (HCC)    Osteoarthrosis    Peripheral neuropathy    Rhabdomyolysis    Sickle cell anemia (HCC)    Stroke (HCC)    Stroke (HCC)    Vitiligo    Past Surgical History:  Past Surgical History:  Procedure Laterality Date   COLON SURGERY     COLONOSCOPY WITH PROPOFOL  N/A 05/09/2015   Procedure: COLONOSCOPY WITH PROPOFOL ;  Surgeon: Deward CINDERELLA Piedmont, MD;  Location: [facility] ENDOSCOPY;  Service: Gastroenterology;  Laterality: N/A;   ESOPHAGOGASTRODUODENOSCOPY N/A 05/09/2015   Procedure: ESOPHAGOGASTRODUODENOSCOPY (EGD);  Surgeon: Deward CINDERELLA Piedmont, MD;  Location: [facility] ENDOSCOPY;  Service: Gastroenterology;  Laterality: N/A;   ESOPHAGOGASTRODUODENOSCOPY (EGD) WITH PROPOFOL  N/A 05/04/2017   Procedure: ESOPHAGOGASTRODUODENOSCOPY (EGD) WITH PROPOFOL ;  Surgeon: Toledo, Ladell POUR, MD;  Location: [facility] ENDOSCOPY;  Service: Gastroenterology;  Laterality: N/A;   FINGER DEBRIDEMENT  1976   s/p infected dog bite   TEE WITHOUT CARDIOVERSION N/A 05/20/2012   Procedure: TRANSESOPHAGEAL ECHOCARDIOGRAM (TEE);  Surgeon: Vina LULLA Gull, MD;  Location: [facility] ENDOSCOPY;  Service: Cardiovascular;  Laterality: N/A;   HPI:  Per critical care progress note, 63 y.o male with significant PMH of HTN, CVA, sickle cell anemia, GERD, polysubstance abuse (EtOH abuse disorder, cocaine, marijuana tobacco abuse), and  recurrent syncope who presented to the ED with chief complaints of syncopal episode. Per chart review, he has been having exertional chest pain, DOP for the past few days and today felt dizzy and passed out without hitting his head. MRI 11/1: Linear focus of diffusion-weighted hyperintensity in the left centrum  semiovale without corresponding ADC reduction, possibly late subacute ischemia.    Assessment / Plan / Recommendation  Clinical Impression  Pt seen for bedside swallow evaluation s/p extubation on 11/2 (8 day intubation). Pt now on room air with O2 saturations maintained in the mid 90's. Pt with notably hoarse, breathy vocal quality and significantly diminished cough strength. Oral care completed, with dry oral cavity noted. Trials completed of thin liquids via spoon and ice chips. Total assist for trial administration. No overt or subtle s/sx pharyngeal dysphagia noted. No change to vocal quality across trials. Vitals stable for duration of trials. Attention and awareness reduced during session, leading to pt repeatedly asking to recline HOB, talking with bolus in oral cavity, anterior loss, and oral holding tendency. Thus, oral phase and safety with PO intake is directly impacted by mentation. Recommend initiation of tsp of water/ice following oral care to aid comfort/conditioning/awareness. Maintain NPO with meds via alternative means. MD, RN, and dietician aware of recommendations.   SLP Visit Diagnosis: Dysphagia, unspecified (R13.10) (s/p extubation)    Aspiration Risk  Moderate aspiration risk    Diet Recommendation   NPO  Medication Administration: Via alternative means    Other  Recommendations Oral  Care Recommendations: Oral care prior to ice chip/H20;Oral care QID;Staff/trained caregiver to provide oral care     Assistance Recommended at Discharge  Assist and supervision for PO intake  Functional Status Assessment Patient has had a recent decline in their functional status and  demonstrates the ability to make significant improvements in function in a reasonable and predictable amount of time.  Frequency and Duration min 2x/week  2 weeks       Prognosis Prognosis for improved oropharyngeal function: Fair Barriers to Reach Goals: Cognitive deficits;Severity of deficits      Swallow Study   General Date of Onset: 02/14/24 HPI: Per critical care progress note, 63 y.o male with significant PMH of HTN, CVA, sickle cell anemia, GERD, polysubstance abuse (EtOH abuse disorder, cocaine, marijuana tobacco abuse), and recurrent syncope who presented to the ED with chief complaints of syncopal episode. Per chart review, he has been having exertional chest pain, DOP for the past few days and today felt dizzy and passed out without hitting his head. MRI 11/1: Linear focus of diffusion-weighted hyperintensity in the left centrum  semiovale without corresponding ADC reduction, possibly late subacute ischemia. Type of Study: Bedside Swallow Evaluation Previous Swallow Assessment: in 2014 with recommendation for regular solids and thin liquids Diet Prior to this Study: NPO Temperature Spikes Noted: No (WBC 13.4) Respiratory Status: Room air History of Recent Intubation: Yes Total duration of intubation (days): 8 days Date extubated: 02/13/24 Behavior/Cognition: Confused;Distractible;Requires cueing Oral Cavity Assessment: Dry Oral Care Completed by SLP: Yes Oral Cavity - Dentition: Poor condition;Missing dentition Vision:  (not assessed) Self-Feeding Abilities: Total assist Patient Positioning: Upright in bed Baseline Vocal Quality: Low vocal intensity;Breathy;Hoarse Volitional Cough: Weak Volitional Swallow: Unable to elicit    Oral/Motor/Sensory Function Overall Oral Motor/Sensory Function: Within functional limits   Ice Chips Ice chips: Impaired Presentation: Spoon Oral Phase Impairments: Poor awareness of bolus;Impaired mastication Oral Phase Functional  Implications: Oral holding;Prolonged oral transit Pharyngeal Phase Impairments: Suspected delayed Swallow   Thin Liquid Thin Liquid: Impaired Presentation: Spoon Oral Phase Impairments: Reduced labial seal;Poor awareness of bolus Pharyngeal  Phase Impairments: Suspected delayed Swallow    Nectar Thick Nectar Thick Liquid: Not tested   Honey Thick Honey Thick Liquid: Not tested   Puree Puree: Not tested   Solid     Solid: Not tested     Josejulian Tarango Clapp, MS, CCC-SLP Speech Language Pathologist Rehab Services; [facility] [place] [phone] (ascom)   Laquisha Northcraft J Clapp 02/14/2024,12:01 PM

## 2024-02-14 NOTE — Progress Notes (Signed)
 PROGRESS NOTE    Edward Weber  FMW:[id number] DOB: 1960/06/29 DOA: 02/04/2024 PCP: [name], MD    Assessment & Plan:   Principal Problem:   Alcohol withdrawal with delirium [facility]) Active Problems:   Syncope   Chest pain   Alcohol abuse   Essential hypertension   GERD without esophagitis   History of CVA (cerebrovascular accident)   T2DM (type 2 diabetes mellitus) (HCC)   Sickle cell anemia (HCC)   Chest pain with moderate risk for cardiac etiology  Assessment and Plan:  Strep pneumonia: continue on IV rocephin, bronchodilators & encourage incentive spirometry   Syncope: etiology unclear. Echo shows EF 60-65%, no regional wall motion abnormalities, grade I diastolic dysfunction, mild MR, mild AR, no atrial shunts detected. Urine drug screen was positive for marijuana, opiates. PT/OT recs SNF  Polysubstance abuse: w/ hx of alcohol & cocaine abuse. Urine drug screen was positive for marijuana & opiates. Needs alcohol cessation counseling   Hx of smoking: needs smoking cessation counseling   DM2: poorly controlled, HbA1c 8.1. Continue on SSI w/ accuchecks    History of sickle cell anemia: H&H are labile. Will continue to monitor    GERD: continue on PPI    Overweight: BMI 27.4. Would benefit from weight loss      DVT prophylaxis: lovenox   Code Status: full  Family Communication:  Disposition Plan: likely d/c to SNF.   Status is: Inpatient Remains inpatient appropriate because: severity of illness    Level of care: Progressive Consultants:  ICU Psych Neuro   Procedures:   Antimicrobials: rocephin   Subjective: Pt is pleasantly confused  Objective: Vitals:   02/14/24 0800 02/14/24 0830 02/14/24 0900 02/14/24 0930  BP: 138/79 (!) 151/85 (!) 165/81 102/67  Pulse: 100 (!) 103 (!) 104 78  Resp: 20 19 16 13   Temp:      TempSrc:      SpO2: 97%     Weight:      Height:        Intake/Output Summary (Last 24 hours) at 02/14/2024 0958 Last  data filed at 02/14/2024 0500 Gross per 24 hour  Intake 215 ml  Output 2100 ml  Net -1885 ml   Filed Weights   02/12/24 0428 02/13/24 0500 02/14/24 0500  Weight: 85.5 kg 84.1 kg 81.9 kg    Examination:  General exam: Appears calm and comfortable  Respiratory system: diminished breath sounds b/l  Cardiovascular system: S1 & S2 +. No rubs, gallops or clicks.  Gastrointestinal system: Abdomen is nondistended, soft and nontender.  Hypoactive bowel sounds heard. Central nervous system: Alert and oriented to self & year only Psychiatry: Judgement and insight appears poor. Flat mood and affect   Data Reviewed: I have personally reviewed following labs and imaging studies  CBC: Recent Labs  Lab 02/10/24 0439 02/11/24 0614 02/12/24 0535 02/13/24 0340 02/14/24 0519  WBC 12.4* 11.9* 13.0* 11.1* 13.4*  HGB 9.9* 9.7* 9.8* 8.8* 9.7*  HCT 30.8* 30.1* 30.7* 27.5* 30.5*  MCV 96.9 96.2 95.9 95.2 95.6  PLT 268 269 332 331 420*   Basic Metabolic Panel: Recent Labs  Lab 02/09/24 0443 02/10/24 0439 02/11/24 0614 02/12/24 0535 02/13/24 0340 02/14/24 0519  NA 142 142 143 140 144 144  K 4.2 4.6 4.3 4.3 4.6 4.4  CL 107 109 109 107 109 106  CO2 21* 24 23 23 24 24   GLUCOSE 174* 248* 230* 233* 255* 143*  BUN 25* 32* 35* 36* 39* 39*  CREATININE 1.06 1.15  1.01 1.12 1.11 1.09  CALCIUM  8.3* 8.6* 8.5* 8.7* 8.9 9.2  MG 2.2 2.4 2.3 2.3 2.2 2.3  PHOS 2.8 3.3 2.9 3.6 3.9  --    GFR: Estimated Creatinine Clearance: 65.4 mL/min (by C-G formula based on SCr of 1.09 mg/dL). Liver Function Tests: Recent Labs  Lab 02/13/24 0340  ALBUMIN 2.5*   No results for input(s): LIPASE, AMYLASE in the last 168 hours. Recent Labs  Lab 02/10/24 0957  AMMONIA <13   Coagulation Profile: No results for input(s): INR, PROTIME in the last 168 hours. Cardiac Enzymes: No results for input(s): CKTOTAL, CKMB, CKMBINDEX, TROPONINI in the last 168 hours. BNP (last 3 results) No results for  input(s): PROBNP in the last 8760 hours. HbA1C: No results for input(s): HGBA1C in the last 72 hours. CBG: Recent Labs  Lab 02/13/24 1528 02/13/24 1923 02/13/24 2308 02/14/24 0411 02/14/24 0804  GLUCAP 114* 152* 163* 149* 140*   Lipid Profile: No results for input(s): CHOL, HDL, LDLCALC, TRIG, CHOLHDL, LDLDIRECT in the last 72 hours. Thyroid Function Tests: No results for input(s): TSH, T4TOTAL, FREET4, T3FREE, THYROIDAB in the last 72 hours. Anemia Panel: No results for input(s): VITAMINB12, FOLATE, FERRITIN, TIBC, IRON, RETICCTPCT in the last 72 hours. Sepsis Labs: Recent Labs  Lab 02/09/24 0443  PROCALCITON 0.25    Recent Results (from the past 240 hours)  Respiratory (~20 pathogens) panel by PCR     Status: None   Collection Time: 02/07/24  4:49 AM   Specimen: Nasopharyngeal Swab; Respiratory  Result Value Ref Range Status   Adenovirus NOT DETECTED NOT DETECTED Final   Coronavirus 229E NOT DETECTED NOT DETECTED Final    Comment: (NOTE) The Coronavirus on the Respiratory Panel, DOES NOT test for the novel  Coronavirus (2019 nCoV)    Coronavirus HKU1 NOT DETECTED NOT DETECTED Final   Coronavirus NL63 NOT DETECTED NOT DETECTED Final   Coronavirus OC43 NOT DETECTED NOT DETECTED Final   Metapneumovirus NOT DETECTED NOT DETECTED Final   Rhinovirus / Enterovirus NOT DETECTED NOT DETECTED Final   Influenza A NOT DETECTED NOT DETECTED Final   Influenza B NOT DETECTED NOT DETECTED Final   Parainfluenza Virus 1 NOT DETECTED NOT DETECTED Final   Parainfluenza Virus 2 NOT DETECTED NOT DETECTED Final   Parainfluenza Virus 3 NOT DETECTED NOT DETECTED Final   Parainfluenza Virus 4 NOT DETECTED NOT DETECTED Final   Respiratory Syncytial Virus NOT DETECTED NOT DETECTED Final   Bordetella pertussis NOT DETECTED NOT DETECTED Final   Bordetella Parapertussis NOT DETECTED NOT DETECTED Final   Chlamydophila pneumoniae NOT DETECTED NOT DETECTED  Final   Mycoplasma pneumoniae NOT DETECTED NOT DETECTED Final    Comment: Performed at [facility], [address], [place], [place] [address]  MRSA Next Gen by PCR, Nasal     Status: None   Collection Time: 02/07/24  4:49 AM   Specimen: Nasal Mucosa; Nasal Swab  Result Value Ref Range Status   MRSA by PCR Next Gen NOT DETECTED NOT DETECTED Final    Comment: (NOTE) The GeneXpert MRSA Assay (FDA approved for NASAL specimens only), is one component of a comprehensive MRSA colonization surveillance program. It is not intended to diagnose MRSA infection nor to guide or monitor treatment for MRSA infections. Test performance is not FDA approved in patients less than 44 years old. Performed at [facility], [address] [place], [place] [address]   Culture, blood (Routine X 2) w Reflex to ID Panel  Status: None   Collection Time: 02/07/24  6:11 AM   Specimen: BLOOD  Result Value Ref Range Status   Specimen Description BLOOD BLOOD RIGHT ARM  Final   Special Requests   Final    BOTTLES DRAWN AEROBIC AND ANAEROBIC Blood Culture results may not be optimal due to an inadequate volume of blood received in culture bottles   Culture   Final    NO GROWTH 5 DAYS Performed at [facility], [address], [place], [place] [address]    Report Status 02/12/2024 FINAL  Final  Culture, blood (Routine X 2) w Reflex to ID Panel     Status: None   Collection Time: 02/07/24  6:18 AM   Specimen: BLOOD  Result Value Ref Range Status   Specimen Description BLOOD BLOOD LEFT ARM  Final   Special Requests   Final    BOTTLES DRAWN AEROBIC AND ANAEROBIC Blood Culture adequate volume   Culture   Final    NO GROWTH 5 DAYS Performed at [facility], [address], [place], [place] [address]    Report Status 02/12/2024 FINAL  Final  Culture, Respiratory w Gram Stain     Status: None   Collection Time: 02/07/24 10:48 AM   Specimen: Tracheal Aspirate; Respiratory   Result Value Ref Range Status   Specimen Description   Final    TRACHEAL ASPIRATE Performed at [facility], [address] [place], [place] [address]    Special Requests   Final    NONE Performed at [facility], [address] [place], [place] [address]    Gram Stain   Final    FEW WBC PRESENT, PREDOMINANTLY PMN RARE GRAM POSITIVE COCCI Performed at [facility], [address], [place], [place] [address]    Culture RARE STREPTOCOCCUS PNEUMONIAE  Final   Report Status 02/10/2024 FINAL  Final   Organism ID, Bacteria STREPTOCOCCUS PNEUMONIAE  Final      Susceptibility   Streptococcus pneumoniae - MIC*    ERYTHROMYCIN >=8 RESISTANT Resistant     LEVOFLOXACIN  0.5 SENSITIVE Sensitive     VANCOMYCIN 0.5 SENSITIVE Sensitive     PENICILLIN (meningitis) 1 RESISTANT Resistant     PENO - penicillin 1      PENICILLIN (non-meningitis) 1 SENSITIVE Sensitive     PENICILLIN (oral) 1 INTERMEDIATE Intermediate     CEFTRIAXONE (non-meningitis) 1 SENSITIVE Sensitive     CEFTRIAXONE (meningitis) 1 INTERMEDIATE Intermediate     * RARE STREPTOCOCCUS PNEUMONIAE         Radiology Studies: MR CERVICAL SPINE W WO CONTRAST Result Date: 02/12/2024 EXAM: MRI BRAIN WITHOUT AND WITH CONTRAST MRI CERVICAL SPINE WITHOUT AND WITH CONTRAST 02/12/2024 03:36:28 PM TECHNIQUE: Multiplanar multisequence MRI of the brain was performed without and with the administration of intravenous contrast. Multiplanar multisequence MRI of the cervical spine was performed without and with the administration of intravenous contrast. 7.5 mL of gadobutrol (GADAVIST) 1 MMOL/ML injection was administered. COMPARISON: Cervical spine MRI dated 08/24/2023 and brain MRI dated 05/11/2022. CLINICAL HISTORY: Mental status change, persistent or worsening. FINDINGS: MRI BRAIN: BRAIN AND VENTRICLES: Linear focus of hyperintensity on diffusion weighted imaging within the left centrum semiovale (series 9 image 22)  without a corresponding ADC deficit, possibly late subacute ischemia. Chronic blood products of the right basal ganglia and external capsule. Multifocal hyperintense T2-weighted signal within the cerebral white matter, most commonly due to chronic small vessel disease. Mild volume loss. No acute infarct. No acute intracranial  hemorrhage. No mass or abnormal enhancement. No midline shift. No hydrocephalus. The sella is unremarkable. Normal flow voids. ORBITS: No acute abnormality. SINUSES AND MASTOIDS: No acute abnormality. BONES AND SOFT TISSUES: Normal bone marrow signal. No acute soft tissue abnormality. LIMITATIONS/ARTIFACTS: Multiple sequences are degraded by motion. MRI CERVICAL SPINE: BONES AND ALIGNMENT: C4 corpectomy with interbody cage and anterior fusion at C3-C5, unchanged. Grade 1 anterolisthesis at C7-T1. Normal vertebral body heights. Marrow signal is unremarkable. No abnormal enhancement. SPINAL CORD: Normal spinal cord size. Normal spinal cord signal. SOFT TISSUES: Unremarkable. C2-C3: Intermediate size disc osteophyte complex with mild spinal canal stenosis and moderate bilateral foraminal stenosis, unchanged. C3-C4: Postsurgical changes. No spinal canal or neural foraminal stenosis. C4-C5: Postsurgical changes, unchanged. Severe bilateral foraminal stenosis. No spinal canal stenosis. C5-C6: Small disc bulge with endplate spurring, unchanged. Mild spinal canal stenosis, unchanged. Severe bilateral foraminal stenosis. C6-C7: Small disc bulge with endplate spurring, unchanged. Severe bilateral foraminal stenosis. Mild central spinal canal stenosis. C7-T1: Grade 1 anterolisthesis. No significant disc herniation. No spinal canal stenosis or neural foraminal narrowing. IMPRESSION: 1. Linear focus of diffusion-weighted hyperintensity in the left centrum semiovale without corresponding ADC reduction, possibly late subacute ischemia. 2. Severe bilateral foraminal stenosis at C4-5 without spinal canal  stenosis, unchanged . 3. Severe bilateral foraminal stenosis at C5-6 with mild spinal canal stenosis, unchanged . 4. Severe bilateral foraminal stenosis at C6-7 with mild central spinal canal stenosis, unchanged . 5. C4 corpectomy with interbody cage and anterior fusion at C3-5. Electronically signed by: Franky Stanford MD 02/12/2024 04:19 PM EDT RP Workstation: [id number]   MR BRAIN W WO CONTRAST Result Date: 02/12/2024 EXAM: MRI BRAIN WITHOUT AND WITH CONTRAST MRI CERVICAL SPINE WITHOUT AND WITH CONTRAST 02/12/2024 03:36:28 PM TECHNIQUE: Multiplanar multisequence MRI of the brain was performed without and with the administration of intravenous contrast. Multiplanar multisequence MRI of the cervical spine was performed without and with the administration of intravenous contrast. 7.5 mL of gadobutrol (GADAVIST) 1 MMOL/ML injection was administered. COMPARISON: Cervical spine MRI dated 08/24/2023 and brain MRI dated 05/11/2022. CLINICAL HISTORY: Mental status change, persistent or worsening. FINDINGS: MRI BRAIN: BRAIN AND VENTRICLES: Linear focus of hyperintensity on diffusion weighted imaging within the left centrum semiovale (series 9 image 22) without a corresponding ADC deficit, possibly late subacute ischemia. Chronic blood products of the right basal ganglia and external capsule. Multifocal hyperintense T2-weighted signal within the cerebral white matter, most commonly due to chronic small vessel disease. Mild volume loss. No acute infarct. No acute intracranial hemorrhage. No mass or abnormal enhancement. No midline shift. No hydrocephalus. The sella is unremarkable. Normal flow voids. ORBITS: No acute abnormality. SINUSES AND MASTOIDS: No acute abnormality. BONES AND SOFT TISSUES: Normal bone marrow signal. No acute soft tissue abnormality. LIMITATIONS/ARTIFACTS: Multiple sequences are degraded by motion. MRI CERVICAL SPINE: BONES AND ALIGNMENT: C4 corpectomy with interbody cage and anterior fusion at C3-C5,  unchanged. Grade 1 anterolisthesis at C7-T1. Normal vertebral body heights. Marrow signal is unremarkable. No abnormal enhancement. SPINAL CORD: Normal spinal cord size. Normal spinal cord signal. SOFT TISSUES: Unremarkable. C2-C3: Intermediate size disc osteophyte complex with mild spinal canal stenosis and moderate bilateral foraminal stenosis, unchanged. C3-C4: Postsurgical changes. No spinal canal or neural foraminal stenosis. C4-C5: Postsurgical changes, unchanged. Severe bilateral foraminal stenosis. No spinal canal stenosis. C5-C6: Small disc bulge with endplate spurring, unchanged. Mild spinal canal stenosis, unchanged. Severe bilateral foraminal stenosis. C6-C7: Small disc bulge with endplate spurring, unchanged. Severe bilateral foraminal stenosis. Mild central spinal canal stenosis. C7-T1: Grade 1  anterolisthesis. No significant disc herniation. No spinal canal stenosis or neural foraminal narrowing. IMPRESSION: 1. Linear focus of diffusion-weighted hyperintensity in the left centrum semiovale without corresponding ADC reduction, possibly late subacute ischemia. 2. Severe bilateral foraminal stenosis at C4-5 without spinal canal stenosis, unchanged . 3. Severe bilateral foraminal stenosis at C5-6 with mild spinal canal stenosis, unchanged . 4. Severe bilateral foraminal stenosis at C6-7 with mild central spinal canal stenosis, unchanged . 5. C4 corpectomy with interbody cage and anterior fusion at C3-5. Electronically signed by: Franky Stanford MD 02/12/2024 04:19 PM EDT RP Workstation: [id number]        Scheduled Meds:  amLODipine  10 mg Oral Daily   B-complex with vitamin C  1 tablet Oral Daily   Chlorhexidine Gluconate Cloth  6 each Topical Daily   cyanocobalamin  1,000 mcg Intramuscular Daily   enoxaparin  (LOVENOX ) injection  1 mg/kg Subcutaneous Q12H   folic acid   1 mg Oral Daily   insulin aspart  0-15 Units Subcutaneous Q4H   insulin glargine-yfgn  10 Units Subcutaneous Daily    ipratropium-albuterol   3 mL Nebulization BID   labetalol   200 mg Oral BID   multivitamin with minerals  1 tablet Oral Daily   nicotine  21 mg Transdermal Daily   pantoprazole  (PROTONIX ) IV  40 mg Intravenous Q12H   thiamine  (VITAMIN B1) injection  100 mg Intravenous Q24H   Continuous Infusions:  cefTRIAXone (ROCEPHIN)  IV Stopped (02/13/24 2252)     LOS: 10 days       Anthony CHRISTELLA Pouch, MD [facility] Pager [phone]  If 7PM-7AM, please contact night-coverage [email or web address] 02/14/2024, 9:58 AM

## 2024-02-14 NOTE — Progress Notes (Signed)
 NAME:  Edward Weber, MRN:  [id number], DOB:  [birth year], LOS: 10 ADMISSION DATE:  02/04/2024 History of Present Illness:  63 y.o male with significant PMH of HTN, CVA, sickle cell anemia, GERD, polysubstance abuse (EtOH abuse disorder, cocaine, marijuana tobacco abuse), and recurrent syncope who presented to the ED with chief complaints of syncopal episode. Per chart review, he has been having exertional chest pain, DOP for the past few days and today felt dizzy and passed out without hitting his head.   ED Course: Initial vital signs showed HR of 84beats/minute, BP203/102 mm Hg, the RR 20 breaths/minute, 98%O2 Sat on and Temp of 98.30F (36.7C).    Pertinent Labs/Diagnostics Findings: Na+/ K+:141/4.0 Glucose:166  WBC:7.3 K/L Hgb/Hct:11.5/34.9  COVID PCR: Negative,  UDS+ Marijuana CXR> CTH> CTA Chest> CT Abd/pelvis>negative for acute abnormality   Admitted to [facility] service for further management of syncopal episode. SEE SIGNIFICANT HOSPITAL EVENTS BELOW.  Pertinent  Medical History    Alcoholism (HCC)     Allergic rhinitis     Allergic state     Anemia     Arthritis     Cancer (HCC)     Chronic hepatitis C (HCC)     Depression     Esophagitis, reflux     GERD (gastroesophageal reflux disease)     Gout     Helicobacter pylori gastritis     Hepatitis     Hypertension     Neuromuscular disorder (HCC)     Osteoarthrosis     Peripheral neuropathy     Rhabdomyolysis     Sickle cell anemia (HCC)     Stroke [facility])     Stroke [facility])     Vitiligo     Significant Hospital Events: Including procedures, antibiotic start and stop dates in addition to other pertinent events   10/24: Admitted to [facility] service for syncopal workup.  Psych consulted for agitation and threatening to leave AMA. Due to history of polysubstance abuse and AMS patient was IVCd 10/25: Remains IVC due to agitation and delirium.  Phenobarb taper added per primary team. 10/26: Transferred to the ICU for worsening  somnolent and increased work of breathing requiring emergent intubation. 10/27: Remains critically ill requiring mechanical ventilation. Agitation requiring propofol . Remains on ceftriaxone for aspiration pna coverage. 10/28: Pt remains mechanically intubated unable to liberate from ventilator due to poor neurological exam and excessive secretions.  Pending MRI Brain  10/30: Remains critically ill requiring mechanically intubation. Extubation precluded due to neurological status. MRI found subacute infarcts. Will consult Neuro for further evaluation and order an EEG. 02/14/24- patient optimized for transfer to [facility].  Blood work is stable with mild hyperglycemia on bmp, CBC with improved hb.   Tracheal aspirate with sprep pneumoniae. PCCM will sign off and available if needed. Objective    Blood pressure 138/79, pulse 100, temperature 98.9 F (37.2 C), temperature source Axillary, resp. rate 20, height 5' 8 (1.727 m), weight 81.9 kg, SpO2 97%.        Intake/Output Summary (Last 24 hours) at 02/14/2024 0821 Last data filed at 02/14/2024 0500 Gross per 24 hour  Intake 245 ml  Output 2200 ml  Net -1955 ml   Filed Weights   02/12/24 0428 02/13/24 0500 02/14/24 0500  Weight: 85.5 kg 84.1 kg 81.9 kg    Examination: General: Intubated, off sedation, opens his eyes to voice commands. HENT: Supple neck, reactive pupils  Lungs: clear bilateral air entry  Cardiovascular: Normal S1, Normal S2, RRR  Abdomen:  Soft, non tender, non distended + BS  Extremities: Warm and well perfused.   Labs and imaging were reviewed.   Assessment and Plan  Edward Weber is a 63 year old male patient with a past medical history of hypertension, CVA, sickle cell anemia, GERD, polysubstance abuse including EtOH cocaine and marijuana presented to the ED for syncopal episode.  He was intubated for airway protection on 10/26.  Chest x-ray without any significant findings.  He is being managed for alcohol withdrawal and Strep  pneumonia.    Respiratory culture growing strep pneumo - C/w Ceftriaxone for 5 more days for a total of 10 days.   Critical care provider statement:   Total critical care time: 33 minutes   Performed by: Parris MD   Critical care time was exclusive of separately billable procedures and treating other patients.   Critical care was necessary to treat or prevent imminent or life-threatening deterioration.   Critical care was time spent personally by me on the following activities: development of treatment plan with patient and/or surrogate as well as nursing, discussions with consultants, evaluation of patient's response to treatment, examination of patient, obtaining history from patient or surrogate, ordering and performing treatments and interventions, ordering and review of laboratory studies, ordering and review of radiographic studies, pulse oximetry and re-evaluation of patient's condition.    Annalie Wenner, M.D.  Pulmonary & Critical Care Medicine

## 2024-02-14 NOTE — Progress Notes (Signed)
 Nutrition Follow Up Note   DOCUMENTATION CODES:   Not applicable  INTERVENTION:   If dobhoff tube placed, recommend:  [name] RTH @70ml /hr continuous   Free water flushes 30ml q4 hours to maintain tube patency   Regimen provides 2352kcal/day, 104g/day protein and 1324ml/day of free water.   Pt remains at refeed risk; recommend monitor potassium, magnesium  and phosphorus labs daily until stable  Thiamine  100mg  daily via tube  Continue Cyanocobalamin 1000mcg IM daily x 7 days   Continue B-complex with C daily via tube x 30 days  Daily weights   NUTRITION DIAGNOSIS:   Inadequate oral intake related to inability to eat (pt sedated and ventilated) as evidenced by NPO status. -ongoing   GOAL:   Patient will meet greater than or equal to 90% of their needs -previously met with tube feeds    MONITOR:   Diet advancement, Labs, Weight trends, I & O's, Skin  ASSESSMENT:   63 y/o male with h/o etoh and substance abuse, milk allergy, HTN, GERD, Schatzki's ring, hiatal hernia, CVA with residual hemiplegia, DM, neuropathy, prostate cancer, HCV, sickle cell anemia, ICH, depression, CKD III, gout, H. pylori and spinal stenosis who is admitted with AMS, syncope, aspiration PNA, sepsis, new sub-acute infarcts and AKI.  Pt extubated 11/2. Pt remains NPO per SLP. Would recommend dobhoff tube placement and nutrition support; this was discussed with medical team. Will resume tube feeds if dobhoff tube placed. Pt remains at refeed risk. Per chart, pt appears to be at his admission weight currently.     Medications reviewed and include: B-complex with C, B12, lovenox , folic acid , insulin, MVI, nicotine, protonix , thiamine , ceftriaxone  Labs reviewed: K 4.4 wnl, BUN 39(H), Mg 2.3 wnl P 3.9 wnl- 11/2 B12- <150(L)- 10/28 wbc- 13.4(H), Hgb 9.7(L), Hct 30.5(L) Cbgs- 148, 140, 149 x 24 hrs  UOP-   Diet Order:   Diet Order             Diet NPO time specified Except for: Other  (See Comments)  Diet effective now                  EDUCATION NEEDS:   No education needs have been identified at this time  Skin:  Skin Assessment: Reviewed RN Assessment  Last BM:  11/3- TYPE 7  Height:   Ht Readings from Last 1 Encounters:  02/04/24 5' 8 (1.727 m)    Weight:   Wt Readings from Last 1 Encounters:  02/14/24 81.9 kg    Ideal Body Weight:  70 kg  BMI:  Body mass index is 27.45 kg/m.  Estimated Nutritional Needs:   Kcal:  2000-2300kcal/day  Protein:  100-115g/day  Fluid:  1.8-2.1L/day  [name] MS, RD, LDN If unable to be reached, please send secure chat to RD inpatient available from 8:00a-4:00p daily

## 2024-02-14 NOTE — Evaluation (Signed)
 Occupational Therapy Evaluation Patient Details Name: Edward Weber MRN: [id number] DOB: 13-May-1960 Today's Date: 02/14/2024   History of Present Illness   63 y.o male with significant PMH of HTN, CVA, sickle cell anemia, GERD, polysubstance abuse (EtOH abuse disorder, cocaine, marijuana tobacco abuse), and recurrent syncope who presented to the ED with chief complaints of syncopal episode. Per chart review, he has been having exertional chest pain, DOP for the past few days and today felt dizzy and passed out without hitting his head. Pt transferred to ICU on 10/26. MRI imaging from 11/01 revealing possible subacute ischemia in left centrum semiovale. Pt recently extubated on 10/30. [name]     Clinical Impressions Patient presenting with decreased Ind in self care,balance, functional mobility/transfers, endurance, NMR, and safety awareness.Patient is likely poor historian and no family present to confirm baseline. Pt reports living at home with girlfriend. He endorses uses of RW and SPC for mobility. He states she assists him with self care as needed. Patient currently functioning at total A of 2 for bed mobility with min -max A for static sitting balance on EOB. Pt able to prop onto R elbow for short period of time with support. Significant weakness in L UE and LE with trace movement. Total A of 2 to return to bed and reposition. Pt placed into chair position. Patient will benefit from acute OT to increase overall independence in the areas of ADLs, functional mobility, and safety awareness in order to safely discharge.      If plan is discharge home, recommend the following:   A lot of help with walking and/or transfers;A lot of help with bathing/dressing/bathroom;Assistance with cooking/housework;Help with stairs or ramp for entrance;Assist for transportation;Direct supervision/assist for financial management;Direct supervision/assist for medications management;Supervision due to cognitive  status     Functional Status Assessment   Patient has had a recent decline in their functional status and demonstrates the ability to make significant improvements in function in a reasonable and predictable amount of time.     Equipment Recommendations   Other (comment) (defer to next venue of care)      Precautions/Restrictions   Precautions Precautions: Fall Recall of Precautions/Restrictions: Intact     Mobility Bed Mobility Overal bed mobility: Needs Assistance Bed Mobility: Supine to Sit, Sit to Supine     Supine to sit: Total assist, +2 for physical assistance Sit to supine: Total assist, +2 for physical assistance   General bed mobility comments: Max cuing for sequencing and hand placement. Pt unable to grasp bed rails to assist. Pt requires assistance for LE and trunk management.    Transfers                   General transfer comment: Deferred d/t weakness.      Balance Overall balance assessment: Needs assistance Sitting-balance support: Single extremity supported, Feet supported Sitting balance-Leahy Scale: Poor                                     ADL either performed or assessed with clinical judgement   ADL Overall ADL's : Needs assistance/impaired                                       General ADL Comments: hand over hand care for grooming needs and total A for UB and  LB self care needs     Vision Baseline Vision/History: 1 Wears glasses Patient Visual Report: No change from baseline              Pertinent Vitals/Pain Pain Assessment Pain Assessment: Faces Faces Pain Scale: Hurts whole lot Pain Location: R LE Pain Descriptors / Indicators: Discomfort, Grimacing, Moaning Pain Intervention(s): Monitored during session, Repositioned     Extremity/Trunk Assessment Upper Extremity Assessment Upper Extremity Assessment: Right hand dominant;RUE deficits/detail;LUE deficits/detail RUE Deficits /  Details: 3-/5 LUE Deficits / Details: 1/5 throughout gross strength           Communication Communication Communication: Impaired Factors Affecting Communication: Reduced clarity of speech   Cognition Arousal: Alert Behavior During Therapy: WFL for tasks assessed/performed, Flat affect Cognition: No family/caregiver present to determine baseline, Cognition impaired   Orientation impairments: Place, Time, Situation Awareness: Intellectual awareness impaired, Online awareness impaired   Attention impairment (select first level of impairment): Focused attention Executive functioning impairment (select all impairments): Initiation, Organization, Sequencing, Reasoning, Problem solving                   Following commands: Impaired Following commands impaired: Follows one step commands inconsistently, Follows one step commands with increased time     Cueing  General Comments   Cueing Techniques: Verbal cues;Gestural cues;Visual cues              Home Living Family/patient expects to be discharged to:: Private residence Living Arrangements: Spouse/significant other Available Help at Discharge: Family;Available PRN/intermittently Type of Home: House       Home Layout: Two level;Able to live on main level with bedroom/bathroom     Bathroom Shower/Tub: Producer, Television/film/video: Standard Bathroom Accessibility: Yes   Home Equipment: Agricultural Consultant (2 wheels);Cane - single point   Additional Comments: Unsure of accuracy of information. Info from previous evaluation this hospitalization is different.      Prior Functioning/Environment Prior Level of Function : Independent/Modified Independent;Patient poor historian/Family not available;History of Falls (last six months)             Mobility Comments: Pt reports amb with SPC and RW ADLs Comments: Pt reports assistance with bathing and dressing from his significant other.    OT Problem List:  Decreased strength;Decreased cognition;Decreased safety awareness;Decreased activity tolerance;Decreased knowledge of use of DME or AE;Impaired balance (sitting and/or standing);Decreased knowledge of precautions;Impaired UE functional use;Impaired sensation;Decreased range of motion   OT Treatment/Interventions: Self-care/ADL training;Therapeutic activities;Therapeutic exercise;Cognitive remediation/compensation;Energy conservation;Patient/family education;Balance training;DME and/or AE instruction      OT Goals(Current goals can be found in the care plan section)   Acute Rehab OT Goals Patient Stated Goal: to go home OT Goal Formulation: With patient Time For Goal Achievement: 02/28/24 Potential to Achieve Goals: Fair ADL Goals Pt Will Perform Grooming: sitting;with min assist Pt Will Perform Lower Body Dressing: with mod assist;sitting/lateral leans Pt Will Transfer to Toilet: with mod assist;stand pivot transfer Pt Will Perform Toileting - Clothing Manipulation and hygiene: with mod assist;sit to/from stand   OT Frequency:  Min 2X/week    Co-evaluation   Reason for Co-Treatment: Complexity of the patient's impairments (multi-system involvement);Necessary to address cognition/behavior during functional activity;For patient/therapist safety PT goals addressed during session: Mobility/safety with mobility OT goals addressed during session: ADL's and self-care      AM-PAC OT 6 Clicks Daily Activity     Outcome Measure Help from another person eating meals?: Total Help from another person taking care of personal grooming?: Total  Help from another person toileting, which includes using toliet, bedpan, or urinal?: Total Help from another person bathing (including washing, rinsing, drying)?: Total Help from another person to put on and taking off regular upper body clothing?: Total Help from another person to put on and taking off regular lower body clothing?: Total 6 Click Score:  6   End of Session Nurse Communication: Mobility status  Activity Tolerance: Patient tolerated treatment well Patient left: in bed;with call bell/phone within reach;with nursing/sitter in room  OT Visit Diagnosis: Unsteadiness on feet (R26.81);Repeated falls (R29.6);Muscle weakness (generalized) (M62.81)                Time: 9088-9061 OT Time Calculation (min): 27 min Charges:  OT General Charges $OT Visit: 1 Visit OT Evaluation $OT Eval Moderate Complexity: 1 [address] [place], OTR/L , CBIS ascom [phone]  02/14/24, 4:22 PM

## 2024-02-14 NOTE — Evaluation (Signed)
 Physical Therapy Evaluation Patient Details Name: Edward Weber MRN: [id number] DOB: [birth year] Today's Date: 02/14/2024  History of Present Illness  63 y.o male with significant PMH of HTN, CVA, sickle cell anemia, GERD, polysubstance abuse (EtOH abuse disorder, cocaine, marijuana tobacco abuse), and recurrent syncope who presented to the ED with chief complaints of syncopal episode. Per chart review, he has been having exertional chest pain, DOP for the past few days and today felt dizzy and passed out without hitting his head. Pt transferred to ICU on 10/26. MRI imaging from 11/01 revealing possible subacute ischemia in left centrum semiovale. Pt recently extubated on 10/30. [name]  Clinical Impression  Co tx with OT this session for safety. Pt is 63 y.o. male admitted for alcohol withdrawal with delirium. Pt is A&Ox3. Prior to hospitalization, pt reports IND with amb using a SPC or RW. Pt reports requiring assistance from his girlfriend with bathing and dressing. Pt now requiring total A x2 for supine<>sit. Pt demonstrates BLE and BUE weakness (LLE>RLE) and pain with light touch to his RLE. Pt requires max A to maintain static sitting, down to min A briefly while propping on R elbow. Would benefit from skilled PT to address above deficits and promote optimal return to PLOF.       If plan is discharge home, recommend the following: Two people to help with walking and/or transfers;Two people to help with bathing/dressing/bathroom;Direct supervision/assist for medications management;Direct supervision/assist for financial management;Assist for transportation;Help with stairs or ramp for entrance;Supervision due to cognitive status   Can travel by private vehicle   No    Equipment Recommendations Other (comment) (TBD at next venue)  Recommendations for Other Services       Functional Status Assessment Patient has had a recent decline in their functional status and demonstrates the ability to  make significant improvements in function in a reasonable and predictable amount of time.     Precautions / Restrictions Precautions Precautions: Fall Recall of Precautions/Restrictions: Intact Restrictions Weight Bearing Restrictions Per Provider Order: No      Mobility  Bed Mobility Overal bed mobility: Needs Assistance Bed Mobility: Supine to Sit, Sit to Supine     Supine to sit: Total assist, +2 for physical assistance Sit to supine: Total assist, +2 for physical assistance   General bed mobility comments: Max cuing for sequencing and hand placement. Pt unable to grasp bed rails to assist. Pt requires assistance for LE and trunk management.    Transfers                   General transfer comment: Deferred d/t weakness.    Ambulation/Gait               General Gait Details: Deferred d/t weakness  Stairs            Wheelchair Mobility     Tilt Bed    Modified Rankin (Stroke Patients Only)       Balance Overall balance assessment: Needs assistance Sitting-balance support: Single extremity supported, Feet supported Sitting balance-Leahy Scale: Poor Sitting balance - Comments: Max A for static sit with breif period of min A. Pt able to prop self on R elbow to sit, however continues to require assistance to maintain.                                     Pertinent Vitals/Pain Pain Assessment Pain Assessment: Faces  Faces Pain Scale: Hurts whole lot Pain Location: R LE Pain Descriptors / Indicators: Discomfort, Grimacing, Moaning Pain Intervention(s): Monitored during session, Limited activity within patient's tolerance    Home Living Family/patient expects to be discharged to:: Private residence Living Arrangements: Spouse/significant other Available Help at Discharge: Family;Available PRN/intermittently Type of Home: House Home Access:  (unsure. Pt states I don't have to worry about them)       Home Layout: Two  level;Able to live on main level with bedroom/bathroom Home Equipment: Rolling Walker (2 wheels);Cane - single point Additional Comments: Unsure of accuracy of information. Info from previous evaluation this hospitalization is different.    Prior Function Prior Level of Function : Independent/Modified Independent;Patient poor historian/Family not available;History of Falls (last six months)             Mobility Comments: Pt reports amb with SPC and RW ADLs Comments: Pt reports assistance with bathing and dressing from his significant other.     Extremity/Trunk Assessment   Upper Extremity Assessment Upper Extremity Assessment: Defer to OT evaluation    Lower Extremity Assessment Lower Extremity Assessment: RLE deficits/detail;LLE deficits/detail RLE Deficits / Details: Pt able perform knee flexion, ankle DF, hip flexion in supine. grossly 3/5 with observation. Pt reports pain with light touch to R LE LLE Deficits / Details: 1/5 hip flexion, knee flexion with obervsation. Trace contraction however unable to perform muscle activation against gravity.       Communication   Communication Communication: Impaired Factors Affecting Communication: Reduced clarity of speech    Cognition Arousal: Alert Behavior During Therapy: WFL for tasks assessed/performed, Flat affect   PT - Cognitive impairments: No family/caregiver present to determine baseline, No apparent impairments                       PT - Cognition Comments: Pt is A&Ox3. Agreeable to PT. Following commands: Impaired Following commands impaired: Follows one step commands inconsistently, Follows one step commands with increased time     Cueing Cueing Techniques: Verbal cues, Gestural cues, Visual cues     General Comments General comments (skin integrity, edema, etc.): Rectal tube intact. Vitals monitored throughout session and remained WNL.    Exercises     Assessment/Plan    PT Assessment Patient  needs continued PT services  PT Problem List Decreased strength;Decreased range of motion;Decreased activity tolerance;Decreased mobility;Decreased balance;Decreased cognition;Pain       PT Treatment Interventions DME instruction;Gait training;Functional mobility training;Therapeutic activities;Therapeutic exercise;Balance training;Neuromuscular re-education;Cognitive remediation;Wheelchair mobility training    PT Goals (Current goals can be found in the Care Plan section)  Acute Rehab PT Goals Patient Stated Goal: none stated PT Goal Formulation: With patient Time For Goal Achievement: 02/28/24 Potential to Achieve Goals: Fair    Frequency Min 2X/week     Co-evaluation PT/OT/SLP Co-Evaluation/Treatment: Yes Reason for Co-Treatment: Complexity of the patient's impairments (multi-system involvement);Necessary to address cognition/behavior during functional activity;For patient/therapist safety PT goals addressed during session: Mobility/safety with mobility OT goals addressed during session: ADL's and self-care       AM-PAC PT 6 Clicks Mobility  Outcome Measure Help needed turning from your back to your side while in a flat bed without using bedrails?: A Lot Help needed moving from lying on your back to sitting on the side of a flat bed without using bedrails?: Total Help needed moving to and from a bed to a chair (including a wheelchair)?: Total Help needed standing up from a chair using your arms (e.g.,  wheelchair or bedside chair)?: Total Help needed to walk in hospital room?: Total Help needed climbing 3-5 steps with a railing? : Total 6 Click Score: 7    End of Session   Activity Tolerance: Patient tolerated treatment well Patient left: in bed;with call bell/phone within reach;with bed alarm set;with SCD's reapplied Nurse Communication: Mobility status PT Visit Diagnosis: Muscle weakness (generalized) (M62.81);Unsteadiness on feet (R26.81)    Time: 9088-9061 PT Time  Calculation (min) (ACUTE ONLY): 27 min   Charges:                 Rajvir Ernster, SPT   Nahdia Doucet 02/14/2024, 10:39 AM

## 2024-02-14 NOTE — Plan of Care (Signed)
  Problem: Education: Goal: Knowledge of General Education information will improve Description: Including pain rating scale, medication(s)/side effects and non-pharmacologic comfort measures Outcome: Progressing   Problem: Clinical Measurements: Goal: Ability to maintain clinical measurements within normal limits will improve Outcome: Progressing Goal: Will remain free from infection Outcome: Progressing Goal: Diagnostic test results will improve Outcome: Progressing Goal: Respiratory complications will improve Outcome: Progressing Goal: Cardiovascular complication will be avoided Outcome: Progressing   Problem: Elimination: Goal: Will not experience complications related to bowel motility Outcome: Progressing Goal: Will not experience complications related to urinary retention Outcome: Progressing   Problem: Pain Managment: Goal: General experience of comfort will improve and/or be controlled Outcome: Progressing   Problem: Safety: Goal: Ability to remain free from injury will improve Outcome: Progressing

## 2024-02-14 NOTE — Consult Note (Signed)
 [facility] Psychiatric Consult Initial  Patient Name: .Edward Weber  MRN: [id number]  DOB: 1960-11-03  Consult Order details:  Orders (From admission, onward)     Start     Ordered   02/04/24 1154  IP CONSULT TO PSYCHIATRY       Ordering Provider: Sonjia Held, MD  Provider:  (Not yet assigned)  Question Answer Comment  Location [facility]   Reason for Consult? aggressiveness, refusing medical care, unsafe with ambulation, IVced for safety      02/04/24 1154             Mode of Visit: In person    Psychiatry Consult Evaluation  Service Date: February 14, 2024 LOS:  LOS: 10 days  Chief Complaint agitation  Primary Psychiatric Diagnoses  Agitation AUD Polysubstance use   Assessment  Edward Weber is a 63 y.o. male admitted: Medically past medical history of hypertension, CVA, sickle cell anemia, GERD, polysubstance abuse including EtOH cocaine and marijuana presented to the ED for syncopal episode. He was intubated for airway protection on 10/26. Chest x-ray without any significant findings, and has since been extubated.  Per nursing he has not been aggressive or refusing care since then.  On exam this morning he was alert and oriented.  He is a poor historian at times but responded to questions appropriately denied SI, HI, AVH.  He was not refusing further care and was not agitated or aggressive.  Discussed with nursing who indicated there is a sitter order but the patient does not continue to require sitter.  He is irritable but not aggressive.  Primary team continues to manage delirium neurology notes have been reviewed.  UDS on arrival was positive for opioids and cannabis.  Further labs were reviewed.  Though patient was appropriately oriented they are a poor historian. Will revisit tomorrow for further assessment.     Diagnoses:  Active Hospital problems: Principal Problem:   Alcohol withdrawal with delirium (HCC) Active Problems:    Alcohol abuse   Syncope   Essential hypertension   GERD without esophagitis   Chest pain   History of CVA (cerebrovascular accident)   T2DM (type 2 diabetes mellitus) (HCC)   Sickle cell anemia (HCC)   Chest pain with moderate risk for cardiac etiology    Plan   ## Psychiatric Medication Recommendations:  None at this time defer to primary team  ## Medical Decision Making Capacity: Not specifically addressed in this encounter  ## Further Work-up:   -- most recent Qtc 481 -- Pertinent labwork reviewed   ## Disposition:--We will continue to follow  ## Behavioral / Environmental: -Delirium Precautions: Delirium Interventions for Nursing and Staff: - RN to open blinds every AM. - To Bedside: Glasses, hearing aide, and pt's own shoes. Make available to patients. when possible and encourage use. - Encourage po fluids when appropriate, keep fluids within reach. - OOB to chair with meals. - Passive ROM exercises to all extremities with AM & PM care. - RN to assess orientation to person, time and place QAM and PRN. - Recommend extended visitation hours with familiar family/friends as feasible. - Staff to minimize disturbances at night. Turn off television when pt asleep or when not in use.    ## Safety and Observation Level:  - Based on my clinical evaluation, I estimate the patient to be at low risk of self harm in the current setting. - At this time, we recommend  routine. This decision is based on  my review of the chart including patient's history and current presentation, interview of the patient, mental status examination, and consideration of suicide risk including evaluating suicidal ideation, plan, intent, suicidal or self-harm behaviors, risk factors, and protective factors. This judgment is based on our ability to directly address suicide risk, implement suicide prevention strategies, and develop a safety plan while the patient is in the clinical setting. Please contact our team if  there is a concern that risk level has changed.  CSSR Risk Category:C-SSRS RISK CATEGORY: No Risk  Thank you for this consult request. Recommendations have been communicated to the primary team.  We will continue to follow at this time.   Donnice FORBES Right, PA-C       History of Present Illness  Relevant Aspects of [facility]   Patient Report:  Patient is a 63 year old male medically admitted is a past medical history of hypertension, CVA, sickle cell anemia, GERD, polysubstance use including alcohol cocaine and marijuana.  He initially presented to the ED for syncopal episode he was later intubated for airway protection on the 26 and was extubated yesterday.  Today he is appropriately oriented but he is a poor historian.  He denies thoughts of harming self or others.  He is frustrated with someone that he lives with and indicates he plans to pick them.  He denied all substance use despite positive drug screen.  There is no ethanol on arrival reviewed CMP as well.  Is unclear patient is interested in mental health services.  Denied depressive symptoms.  Was irritable and did not fully participate in exam.  Will continue to reassess.     Psychiatric and Social History  Psychiatric History: Patient is a poor historian will continue to address there is a history of polysubstance abuse.  Concerning for cocaine, opioids, and alcohol.  On interview today patient denied all substance use.  Patient denied psychiatric history.  Denied current thoughts of harming self or others.  Will continue to reassess. Exam Findings  Physical Exam: alert and NAD Vital Signs:  Temp:  [97.9 F (36.6 C)-98.9 F (37.2 C)] 98.9 F (37.2 C) (11/03 1230) Pulse Rate:  [78-104] 92 (11/03 1230) Resp:  [13-30] 20 (11/03 1230) BP: (102-169)/(67-103) 154/79 (11/03 1230) SpO2:  [93 %-99 %] 97 % (11/03 0800) Weight:  [81.9 kg] 81.9 kg (11/03 0500) Blood pressure (!) 154/79, pulse 92, temperature 98.9 F (37.2  C), temperature source Axillary, resp. rate 20, height 5' 8 (1.727 m), weight 81.9 kg, SpO2 97%. Body mass index is 27.45 kg/m.    Mental Status Exam: General Appearance: Disheveled  Orientation:  Full (Time, Place, and Person)  Memory:  Immediate;   Poor  Concentration:  Concentration: Fair  Recall:  Poor  Attention  Poor  Eye Contact:  Minimal  Speech:  Garbled  Language:  Fair  Volume:  Decreased  Mood: irritable  Affect:  Congruent  Thought Process:  Coherent  Thought Content:  Rumination  Suicidal Thoughts:  No  Homicidal Thoughts:  No  Judgement:  Poor  Insight:  Lacking  Psychomotor Activity:  NA  Akathisia:  NA  Fund of Knowledge:  Poor      Assets:  Desire for Improvement      AIMS (if indicated):        Other History   These have been pulled in through the EMR, reviewed, and updated if appropriate.  Family History:  The patient's family history is not on file.  Medical History: Past Medical History:  Diagnosis Date   Alcoholism (HCC)    Allergic rhinitis    Allergic state    Anemia    Arthritis    Cancer (HCC)    Chronic hepatitis C (HCC)    Depression    Esophagitis, reflux    GERD (gastroesophageal reflux disease)    Gout    Helicobacter pylori gastritis    Hepatitis    Hypertension    Neuromuscular disorder (HCC)    Osteoarthrosis    Peripheral neuropathy    Rhabdomyolysis    Sickle cell anemia (HCC)    Stroke (HCC)    Stroke [facility])    Vitiligo     Surgical History: Past Surgical History:  Procedure Laterality Date   COLON SURGERY     COLONOSCOPY WITH PROPOFOL  N/A 05/09/2015   Procedure: COLONOSCOPY WITH PROPOFOL ;  Surgeon: Deward CINDERELLA Piedmont, MD;  Location: [facility] ENDOSCOPY;  Service: Gastroenterology;  Laterality: N/A;   ESOPHAGOGASTRODUODENOSCOPY N/A 05/09/2015   Procedure: ESOPHAGOGASTRODUODENOSCOPY (EGD);  Surgeon: Deward CINDERELLA Piedmont, MD;  Location: [facility] ENDOSCOPY;  Service: Gastroenterology;  Laterality: N/A;   ESOPHAGOGASTRODUODENOSCOPY (EGD)  WITH PROPOFOL  N/A 05/04/2017   Procedure: ESOPHAGOGASTRODUODENOSCOPY (EGD) WITH PROPOFOL ;  Surgeon: Toledo, Ladell POUR, MD;  Location: [facility] ENDOSCOPY;  Service: Gastroenterology;  Laterality: N/A;   FINGER DEBRIDEMENT  1976   s/p infected dog bite   TEE WITHOUT CARDIOVERSION N/A 05/20/2012   Procedure: TRANSESOPHAGEAL ECHOCARDIOGRAM (TEE);  Surgeon: Vina LULLA Gull, MD;  Location: [facility] ENDOSCOPY;  Service: Cardiovascular;  Laterality: N/A;     Medications:   Current Facility-Administered Medications:    acetaminophen  (TYLENOL ) tablet 650 mg, 650 mg, Oral, Q6H PRN, 650 mg at 02/10/24 1204 **OR** acetaminophen  (TYLENOL ) suppository 650 mg, 650 mg, Rectal, Q6H PRN, Pokhrel, Laxman, MD   amLODipine (NORVASC) tablet 10 mg, 10 mg, Oral, Daily, Chappell, Alex B, RPH   B-complex with vitamin C tablet 1 tablet, 1 tablet, Oral, Daily, Chappell, Alex B, RPH   cefTRIAXone (ROCEPHIN) 2 g in sodium chloride  0.9 % 100 mL IVPB, 2 g, Intravenous, Q24H, Assaker, Jean-Pierre, MD, Stopped at 02/13/24 2252   Chlorhexidine Gluconate Cloth 2 % PADS 6 each, 6 each, Topical, Daily, Assaker, Jean-Pierre, MD, 6 each at 02/13/24 2348   cyanocobalamin (VITAMIN B12) injection 1,000 mcg, 1,000 mcg, Intramuscular, Daily, Assaker, Jean-Pierre, MD, 1,000 mcg at 02/12/24 1953   enoxaparin  (LOVENOX ) injection 85 mg, 1 mg/kg, Subcutaneous, Q12H, Assaker, Jean-Pierre, MD, 85 mg at 02/14/24 1244   fentaNYL  (SUBLIMAZE ) injection 25-50 mcg, 25-50 mcg, Intravenous, Q2H PRN, Ouma, Elizabeth Achieng, NP, 50 mcg at 02/12/24 1407   folic acid  (FOLVITE ) tablet 1 mg, 1 mg, Oral, Daily, Chappell, Alex B, RPH   hydrALAZINE  (APRESOLINE ) injection 10-20 mg, 10-20 mg, Intravenous, Q4H PRN, Rust-Chester, Britton L, NP, 20 mg at 02/14/24 0447   insulin aspart (novoLOG) injection 0-15 Units, 0-15 Units, Subcutaneous, Q4H, Keene, Jeremiah D, NP, 2 Units at 02/14/24 1244   insulin glargine-yfgn (SEMGLEE) injection 10 Units, 10 Units, Subcutaneous, Daily,  Assaker, Jean-Pierre, MD, 10 Units at 02/14/24 0918   ipratropium-albuterol  (DUONEB) 0.5-2.5 (3) MG/3ML nebulizer solution 3 mL, 3 mL, Nebulization, Q6H PRN, Ouma, Almarie Bake, NP   labetalol  (NORMODYNE ) injection 20 mg, 20 mg, Intravenous, Q2H PRN, Wouk, Devaughn Sayres, MD, 20 mg at 02/14/24 9088   labetalol  (NORMODYNE ) tablet 200 mg, 200 mg, Oral, BID, Chappell, Alex B, RPH   multivitamin with minerals tablet 1 tablet, 1 tablet, Oral, Daily, Pokhrel, Laxman, MD, 1 tablet at 02/12/24 0929   nicotine (NICODERM CQ - dosed  in mg/24 hours) patch 21 mg, 21 mg, Transdermal, Daily, Pokhrel, Laxman, MD, 21 mg at 02/14/24 0911   Oral care mouth rinse, 15 mL, Mouth Rinse, PRN, Assaker, Jean-Pierre, MD, 15 mL at 02/13/24 2000   pantoprazole  (PROTONIX ) injection 40 mg, 40 mg, Intravenous, Q12H, Ouma, Almarie Bake, NP, 40 mg at 02/14/24 0911   polyethylene glycol (MIRALAX / GLYCOLAX) packet 17 g, 17 g, Oral, Daily PRN, Pokhrel, Laxman, MD   thiamine  (VITAMIN B1) injection 100 mg, 100 mg, Intravenous, Q24H, Assaker, Jean-Pierre, MD, 100 mg at 02/14/24 0919  Allergies: Allergies  Allergen Reactions   Bee Venom Anaphylaxis   Bovine (Beef) Protein Other (See Comments)    Personal preference   Bovine (Beef) Protein-Containing Drug Products     Personal preference   Lactalbumin    Milk-Related Compounds Nausea And Vomiting    Intoloerance   Pegademase Bovine     Other reaction(s): Unknown Personal preference Personal preference Personal preference    Tilactase Nausea And Vomiting    Intoloerance   Milk (Cow) Nausea And Vomiting    Intoloerance    Donnice FORBES Right, PA-C

## 2024-02-14 NOTE — Progress Notes (Signed)
 Patient's significant other stopped by while patient was working with occupational therapy. This RN verified with patient (who is Aox4) if I could share updates with his girlfriend. Patient gave permission to give update at this time.

## 2024-02-15 DIAGNOSIS — F10931 Alcohol use, unspecified with withdrawal delirium: Secondary | ICD-10-CM | POA: Diagnosis not present

## 2024-02-15 LAB — GLUCOSE, CAPILLARY
Glucose-Capillary: 104 mg/dL — ABNORMAL HIGH (ref 70–99)
Glucose-Capillary: 118 mg/dL — ABNORMAL HIGH (ref 70–99)
Glucose-Capillary: 125 mg/dL — ABNORMAL HIGH (ref 70–99)
Glucose-Capillary: 138 mg/dL — ABNORMAL HIGH (ref 70–99)
Glucose-Capillary: 143 mg/dL — ABNORMAL HIGH (ref 70–99)
Glucose-Capillary: 233 mg/dL — ABNORMAL HIGH (ref 70–99)
Glucose-Capillary: 86 mg/dL (ref 70–99)

## 2024-02-15 LAB — BASIC METABOLIC PANEL WITH GFR
Anion gap: 14 (ref 5–15)
BUN: 42 mg/dL — ABNORMAL HIGH (ref 8–23)
CO2: 23 mmol/L (ref 22–32)
Calcium: 9 mg/dL (ref 8.9–10.3)
Chloride: 110 mmol/L (ref 98–111)
Creatinine, Ser: 1.22 mg/dL (ref 0.61–1.24)
GFR, Estimated: >60 mL/min (ref >60)
Glucose, Bld: 127 mg/dL — ABNORMAL HIGH (ref 70–99)
Potassium: 4.8 mmol/L (ref 3.5–5.1)
Sodium: 147 mmol/L — ABNORMAL HIGH (ref 135–145)

## 2024-02-15 LAB — CBC
HCT: 33.5 % — ABNORMAL LOW (ref 39.0–52.0)
Hemoglobin: 10.4 g/dL — ABNORMAL LOW (ref 13.0–17.0)
MCH: 30.4 pg (ref 26.0–34.0)
MCHC: 31 g/dL (ref 30.0–36.0)
MCV: 98 fL (ref 80.0–100.0)
Platelets: 432 K/uL — ABNORMAL HIGH (ref 150–400)
RBC: 3.42 MIL/uL — ABNORMAL LOW (ref 4.22–5.81)
RDW: 15.1 % (ref 11.5–15.5)
WBC: 12.3 K/uL — ABNORMAL HIGH (ref 4.0–10.5)
nRBC: 0.5 % — ABNORMAL HIGH (ref 0.0–0.2)

## 2024-02-15 LAB — MAGNESIUM: Magnesium: 2.4 mg/dL (ref 1.7–2.4)

## 2024-02-15 MED ORDER — PANTOPRAZOLE SODIUM 40 MG PO TBEC
40.0000 mg | DELAYED_RELEASE_TABLET | Freq: Every day | ORAL | Status: DC
  Administered 2024-02-16 – 2024-02-22 (×6): 40 mg via ORAL
  Filled 2024-02-15 (×6): qty 1

## 2024-02-15 MED ORDER — ORAL CARE MOUTH RINSE: 15.0000 mL | OROMUCOSAL | Status: DC | PRN

## 2024-02-15 MED ORDER — KATE FARMS STANDARD 1.4 EN LIQD
325.0000 mL | Freq: Three times a day (TID) | ENTERAL | Status: DC
  Administered 2024-02-16 – 2024-02-20 (×8): 325 mL via ORAL

## 2024-02-15 MED ORDER — VITAMIN B-12 1000 MCG PO TABS
1000.0000 ug | ORAL_TABLET | Freq: Every day | ORAL | Status: DC
Start: 2024-02-16 — End: 2024-03-17
  Administered 2024-02-16 – 2024-02-17 (×2): 1000 ug via ORAL
  Filled 2024-02-15 (×2): qty 1

## 2024-02-15 MED ORDER — THIAMINE HCL 100 MG PO TABS
100.0000 mg | ORAL_TABLET | Freq: Every day | ORAL | Status: DC
Start: 2024-02-16 — End: 2024-02-16
  Administered 2024-02-16: 100 mg via ORAL
  Filled 2024-02-15: qty 1

## 2024-02-15 NOTE — Progress Notes (Signed)
 Occupational Therapy Treatment Patient Details Name: Edward Weber MRN: [id number] DOB: [birth year] Today's Date: 02/15/2024   History of present illness 63 y.o male with significant PMH of HTN, CVA, sickle cell anemia, GERD, polysubstance abuse (EtOH abuse disorder, cocaine, marijuana tobacco abuse), and recurrent syncope who presented to the ED with chief complaints of syncopal episode. Per chart review, he has been having exertional chest pain, DOP for the past few days and today felt dizzy and passed out without hitting his head. Pt transferred to ICU on 10/26. MRI imaging from 11/01 revealing possible subacute ischemia in left centrum semiovale. Pt recently extubated on 10/30. .   OT comments  Upon entering the room, pt supine in bed with sister present in room. Pt is agreeable to co-treatment with PT and OT to address bed mobility and functional deficits. Pt needing total A of 2 for supine <>sit to EOB. Pt needing total A for static sitting balance with no righting reactions noted. Pt also noted to have L inattention during session and room re-arranged to encourage looking/tracking to the L. Total A of 2 to return to supine. Call bell and all needed items within reach.       If plan is discharge home, recommend the following:  A lot of help with walking and/or transfers;A lot of help with bathing/dressing/bathroom;Assistance with cooking/housework;Help with stairs or ramp for entrance;Assist for transportation;Direct supervision/assist for financial management;Direct supervision/assist for medications management;Supervision due to cognitive status   Equipment Recommendations  Other (comment) (defer to next venue of care)       Precautions / Restrictions Precautions Precautions: Fall Recall of Precautions/Restrictions: Intact       Mobility Bed Mobility Overal bed mobility: Needs Assistance Bed Mobility: Supine to Sit, Sit to Supine     Supine to sit: Total assist, +2 for  physical assistance Sit to supine: Total assist, +2 for physical assistance        Transfers                   General transfer comment: unable to attempt     Balance Overall balance assessment: Needs assistance Sitting-balance support: Single extremity supported, Feet supported, No upper extremity supported Sitting balance-Leahy Scale: Zero                                     ADL either performed or assessed with clinical judgement     Vision   Additional Comments: L inattention noted- to be tested further in functional context         Communication Communication Communication: Impaired Factors Affecting Communication: Reduced clarity of speech   Cognition Arousal: Alert Behavior During Therapy: WFL for tasks assessed/performed, Flat affect Cognition: Cognition impaired   Orientation impairments: Place, Time, Situation Awareness: Intellectual awareness impaired, Online awareness impaired   Attention impairment (select first level of impairment): Focused attention Executive functioning impairment (select all impairments): Initiation, Organization, Sequencing, Reasoning, Problem solving                   Following commands: Impaired Following commands impaired: Follows one step commands inconsistently, Follows one step commands with increased time      Cueing   Cueing Techniques: Verbal cues, Gestural cues, Visual cues             Pertinent Vitals/ Pain       Pain Assessment Pain Assessment: Faces Faces Pain Scale: Hurts  whole lot Pain Location: generalized- unable to verbalize location Pain Descriptors / Indicators: Discomfort, Grimacing, Guarding Pain Intervention(s): Limited activity within patient's tolerance, Monitored during session, Repositioned         Frequency  Min 2X/week        Progress Toward Goals  OT Goals(current goals can now be found in the care plan section)  Progress towards OT goals: Progressing  toward goals         Co-evaluation    PT/OT/SLP Co-Evaluation/Treatment: Yes Reason for Co-Treatment: Complexity of the patient's impairments (multi-system involvement);Necessary to address cognition/behavior during functional activity;For patient/therapist safety PT goals addressed during session: Mobility/safety with mobility OT goals addressed during session: ADL's and self-care      AM-PAC OT 6 Clicks Daily Activity     Outcome Measure   Help from another person eating meals?: Total Help from another person taking care of personal grooming?: Total Help from another person toileting, which includes using toliet, bedpan, or urinal?: Total Help from another person bathing (including washing, rinsing, drying)?: Total Help from another person to put on and taking off regular upper body clothing?: Total Help from another person to put on and taking off regular lower body clothing?: Total 6 Click Score: 6    End of Session    OT Visit Diagnosis: Unsteadiness on feet (R26.81);Repeated falls (R29.6);Muscle weakness (generalized) (M62.81)   Activity Tolerance Patient limited by fatigue   Patient Left in bed;with call bell/phone within reach;with bed alarm set   Nurse Communication Mobility status        Time: 8899-8876 OT Time Calculation (min): 23 min  Charges: OT General Charges $OT Visit: 1 Visit OT Treatments $Therapeutic Activity: 8-22 mins  Izetta Claude, MS, OTR/L , CBIS ascom [phone]  02/15/24, 12:02 PM

## 2024-02-15 NOTE — Plan of Care (Signed)

## 2024-02-15 NOTE — Progress Notes (Signed)
 Nutrition Follow Up Note   DOCUMENTATION CODES:   Not applicable  INTERVENTION:   [place] 1.4 po TID, each supplement provides 455 kcal and 20 grams protein.  Pt remains at refeed risk; recommend monitor potassium, magnesium  and phosphorus labs daily until stable  MVI po daily   Thiamine  100mg  po daily   Cyanocobalamin 1000mcg po daily   B-complex with C po daily x 30 days  Daily weights   NUTRITION DIAGNOSIS:   Inadequate oral intake related to inability to eat (pt sedated and ventilated) as evidenced by NPO status. -ongoing   GOAL:   Patient will meet greater than or equal to 90% of their needs -previously met with tube feeds    MONITOR:   PO intake, Supplement acceptance, Labs, Weight trends, I & O's, Skin  ASSESSMENT:   63 y/o male with h/o etoh and substance abuse, milk allergy, HTN, GERD, Schatzki's ring, hiatal hernia, CVA with residual hemiplegia, DM, neuropathy, prostate cancer, HCV, sickle cell anemia, ICH, depression, CKD III, gout, H. pylori and spinal stenosis who is admitted with AMS, syncope, aspiration PNA, sepsis, new sub-acute infarcts and AKI.  Pt seen by SLP today and initiated on an oral diet. RD will add supplements and vitamins to help pt meet his estimated needs. Pt remains at refeed risk. Per chart, pt is weight stable.     Medications reviewed and include: B-complex with C, B12, lovenox , folic acid , insulin, MVI, nicotine, protonix , thiamine , ceftriaxone  Labs reviewed: Na 147(H), K 4.8 wnl, BUN 42(H), Mg 2.4 wnl P 3.9 wnl- 11/2 B12- <150(L)- 10/28 wbc- 12.3(H), Hgb 10.4(L), Hct 33.5(L) Cbgs- 143, 138, 104, 125 x 24 hrs  UOP-   Diet Order:   Diet Order             DIET - DYS 1 Room service appropriate? No; Fluid consistency: Thin  Diet effective now                  EDUCATION NEEDS:   No education needs have been identified at this time  Skin:  Skin Assessment: Reviewed RN Assessment  Last BM:  11/3- TYPE  7  Height:   Ht Readings from Last 1 Encounters:  02/04/24 5' 8 (1.727 m)    Weight:   Wt Readings from Last 1 Encounters:  02/15/24 82.9 kg    Ideal Body Weight:  70 kg  BMI:  Body mass index is 27.79 kg/m.  Estimated Nutritional Needs:   Kcal:  2000-2300kcal/day  Protein:  100-115g/day  Fluid:  1.8-2.1L/day  [name] MS, RD, LDN If unable to be reached, please send secure chat to RD inpatient available from 8:00a-4:00p daily

## 2024-02-15 NOTE — Progress Notes (Signed)
 Physical Therapy Treatment Patient Details Name: Edward Weber MRN: [id number] DOB: Jan 27, 1961 Today's Date: 02/15/2024   History of Present Illness 63 y.o male with significant PMH of HTN, CVA, sickle cell anemia, GERD, polysubstance abuse (EtOH abuse disorder, cocaine, marijuana tobacco abuse), and recurrent syncope who presented to the ED with chief complaints of syncopal episode. Per chart review, he has been having exertional chest pain, DOP for the past few days and today felt dizzy and passed out without hitting his head. Pt transferred to ICU on 10/26. MRI imaging from 11/01 revealing possible subacute ischemia in left centrum semiovale. Pt recently extubated on 10/30. [name]    PT Comments  Patient is agreeable to PT session. He is lethargic with increased alertness with mobility. Patient continues to require significant assistance for bed mobility. Unable to attempt standing due to poor participation and weakness. Recommend to continue PT to maximize independence and facilitate return to prior level of function. Rehabilitation < 3 hours/day recommended after this hospital stay.    If plan is discharge home, recommend the following: Two people to help with walking and/or transfers;Two people to help with bathing/dressing/bathroom;Direct supervision/assist for medications management;Direct supervision/assist for financial management;Assist for transportation;Help with stairs or ramp for entrance;Supervision due to cognitive status   Can travel by private vehicle     No  Equipment Recommendations       Recommendations for Other Services       Precautions / Restrictions Precautions Precautions: Fall Recall of Precautions/Restrictions: Impaired Restrictions Weight Bearing Restrictions Per Provider Order: No     Mobility  Bed Mobility Overal bed mobility: Needs Assistance Bed Mobility: Supine to Sit, Sit to Supine     Supine to sit: Total assist, +2 for physical assistance Sit  to supine: Total assist, +2 for physical assistance   General bed mobility comments: cues for initiation. minimal participation    Transfers                   General transfer comment: unable to attempt    Ambulation/Gait                   Stairs             Wheelchair Mobility     Tilt Bed    Modified Rankin (Stroke Patients Only)       Balance Overall balance assessment: Needs assistance Sitting-balance support: Single extremity supported, Feet supported, No upper extremity supported Sitting balance-Leahy Scale: Zero Sitting balance - Comments: decreased protective righting reactions. Max A required                                    Communication Communication Communication: Impaired Factors Affecting Communication: Reduced clarity of speech  Cognition Arousal: Alert, Lethargic Behavior During Therapy: WFL for tasks assessed/performed   PT - Cognitive impairments: Difficult to assess Difficult to assess due to: Impaired communication, Level of arousal                     PT - Cognition Comments: cues for initiation, sequencing Following commands: Impaired Following commands impaired: Follows one step commands inconsistently, Follows one step commands with increased time    Cueing Cueing Techniques: Verbal cues, Gestural cues, Visual cues  Exercises      General Comments General comments (skin integrity, edema, etc.): mild questionable inattention to the left that improved with cues. patient does not  actively move BLE to command, no active movement noted LUE. generalized weakness throughout      Pertinent Vitals/Pain Pain Assessment Pain Assessment: Faces Faces Pain Scale: Hurts whole lot Pain Location: generalized, unable to state. Pain Descriptors / Indicators: Discomfort, Grimacing Pain Intervention(s): Limited activity within patient's tolerance    Home Living                          Prior  Function            PT Goals (current goals can now be found in the care plan section) Acute Rehab PT Goals Patient Stated Goal: none stated PT Goal Formulation: With patient Time For Goal Achievement: 02/28/24 Potential to Achieve Goals: Fair Progress towards PT goals: Progressing toward goals    Frequency    Min 2X/week      PT Plan      Co-evaluation   Reason for Co-Treatment: Complexity of the patient's impairments (multi-system involvement);Necessary to address cognition/behavior during functional activity;For patient/therapist safety PT goals addressed during session: Mobility/safety with mobility OT goals addressed during session: ADL's and self-care      AM-PAC PT 6 Clicks Mobility   Outcome Measure  Help needed turning from your back to your side while in a flat bed without using bedrails?: A Lot Help needed moving from lying on your back to sitting on the side of a flat bed without using bedrails?: Total Help needed moving to and from a bed to a chair (including a wheelchair)?: Total Help needed standing up from a chair using your arms (e.g., wheelchair or bedside chair)?: Total Help needed to walk in hospital room?: Total Help needed climbing 3-5 steps with a railing? : Total 6 Click Score: 7    End of Session   Activity Tolerance: Patient tolerated treatment well;Patient limited by fatigue Patient left: in bed;with call bell/phone within reach;with bed alarm set Nurse Communication: Mobility status PT Visit Diagnosis: Muscle weakness (generalized) (M62.81);Unsteadiness on feet (R26.81)     Time: 1107-1130 PT Time Calculation (min) (ACUTE ONLY): 23 min  Charges:    $Therapeutic Activity: 8-22 mins PT General Charges $$ ACUTE PT VISIT: 1 Visit                     Edward Weber, PT, MPT    Edward Weber 02/15/2024, 1:32 PM

## 2024-02-15 NOTE — Progress Notes (Addendum)
 PROGRESS NOTE   HPI was taken from Dr. Sonjia: Patient is a 63 years old male with past medical history of hypertension, stroke, history of sickle cell anemia, GERD polysubstance abuse presented to the hospital with complaints of dizziness and passing out spell while he was trying to walk to his bathroom.  He also had some shortness of breath and exertional chest pain and dyspnea on exertion for past few days.  Family was able to catch him and lowered him to the floor but patient complains of pain on the back.  There was report that recently his medications including gabapentin  and amlodipine were removed.  Patient states that he has been smoking cigarettes and he uses cocaine.  Denies any nausea vomiting or abdominal pain.  Denies any urinary urgency frequency or dysuria.  Denies any dizziness lightheadedness currently.  Denies any sick contacts or recent travel.   In the ED, patient had elevated blood pressure at 203/102.  Afebrile with a temperature of 98.1 F.  Labs were notable for normal WBC with hemoglobin of 11.5.  BMP with creatinine of 1.0.  BNP within normal range at 81.  COVID influenza and RSV was negative.  Chest x-ray showed no acute infiltrate.  CT angiogram of the chest showed no evidence of pulmonary embolism.  CT head scan was negative for acute findings.  Patient was then considered for admission to hospital for syncope.   Edward Weber  FMW:[id number] DOB: [birth year] DOA: 02/04/2024 PCP: [name], MD    Assessment & Plan:   Principal Problem:   Alcohol withdrawal with delirium [facility]) Active Problems:   Syncope   Chest pain   Alcohol abuse   Essential hypertension   GERD without esophagitis   History of CVA (cerebrovascular accident)   T2DM (type 2 diabetes mellitus) (HCC)   Sickle cell anemia (HCC)   Chest pain with moderate risk for cardiac etiology  Assessment and Plan:  Strep pneumonia: continue on IV rocephin, bronchodilators & encourage incentive  spirometry   Syncope: etiology unclear. Echo shows EF 60-65%, no regional wall motion abnormalities, grade I diastolic dysfunction, mild MR, mild AR, no atrial shunts detected. Urine drug screen was positive for marijuana, opiates. MRI brain showed small subacute CVA (not acute/recent in appearance as per rads). Will need zio patch prior to d/c as per neuro. PT/OT recs SNF  Polysubstance abuse: w/ hx of alcohol & cocaine abuse. Urine drug screen was positive for marijuana & opiates. Needs alcohol cessation counseling   Generalized weakness: PT/OT recs SNF.   Hx of smoking: needs smoking cessation counseling   DM2: poorly controlled, HbA1c 8.1. Continue on SSI w/ accuchecks   History of sickle cell anemia: H&H are labile. Will transfuse if Hb < 7.0   GERD: continue on PPI     Overweight: BMI 27.4. Would benefit from weight loss      DVT prophylaxis: lovenox   Code Status: full  Family Communication: discussed pt's care w/ pt's daughter, Arnaldo, and answered her questions  Disposition Plan: likely d/c to SNF.   Status is: Inpatient Remains inpatient appropriate because: severity of illness    Level of care: Progressive Consultants:  ICU Psych Neuro   Procedures:   Antimicrobials: rocephin   Subjective: Pt is c/o malaise  Objective: Vitals:   02/14/24 2333 02/15/24 0445 02/15/24 0500 02/15/24 0724  BP: (!) 163/92 (!) 163/85  (!) 161/94  Pulse: (!) 101 85  94  Resp: (!) 23 20  16   Temp:  98.8  F (37.1 C)  99 F (37.2 C)  TempSrc:      SpO2: 93% 94%  92%  Weight:   82.9 kg   Height:        Intake/Output Summary (Last 24 hours) at 02/15/2024 0908 Last data filed at 02/15/2024 0300 Gross per 24 hour  Intake 50 ml  Output 1300 ml  Net -1250 ml   Filed Weights   02/13/24 0500 02/14/24 0500 02/15/24 0500  Weight: 84.1 kg 81.9 kg 82.9 kg    Examination:  General exam: appears comfortable  Respiratory system: decreased breath sounds b/l  Cardiovascular  system: S1/S2+. No rubs or gallops   Gastrointestinal system: abd is soft, NT, ND & hypoactive bowel sounds Central nervous system: alert & awake. Moves all extremities Psychiatry: Judgement and insight appears improved. Flat mood and affect   Data Reviewed: I have personally reviewed following labs and imaging studies  CBC: Recent Labs  Lab 02/11/24 0614 02/12/24 0535 02/13/24 0340 02/14/24 0519 02/15/24 0600  WBC 11.9* 13.0* 11.1* 13.4* 12.3*  HGB 9.7* 9.8* 8.8* 9.7* 10.4*  HCT 30.1* 30.7* 27.5* 30.5* 33.5*  MCV 96.2 95.9 95.2 95.6 98.0  PLT 269 332 331 420* 432*   Basic Metabolic Panel: Recent Labs  Lab 02/09/24 0443 02/10/24 0439 02/11/24 0614 02/12/24 0535 02/13/24 0340 02/14/24 0519 02/15/24 0600  NA 142 142 143 140 144 144 147*  K 4.2 4.6 4.3 4.3 4.6 4.4 4.8  CL 107 109 109 107 109 106 110  CO2 21* 24 23 23 24 24 23   GLUCOSE 174* 248* 230* 233* 255* 143* 127*  BUN 25* 32* 35* 36* 39* 39* 42*  CREATININE 1.06 1.15 1.01 1.12 1.11 1.09 1.22  CALCIUM  8.3* 8.6* 8.5* 8.7* 8.9 9.2 9.0  MG 2.2 2.4 2.3 2.3 2.2 2.3 2.4  PHOS 2.8 3.3 2.9 3.6 3.9  --   --    GFR: Estimated Creatinine Clearance: 63.4 mL/min (by C-G formula based on SCr of 1.22 mg/dL). Liver Function Tests: Recent Labs  Lab 02/13/24 0340  ALBUMIN 2.5*   No results for input(s): LIPASE, AMYLASE in the last 168 hours. Recent Labs  Lab 02/10/24 0957  AMMONIA <13   Coagulation Profile: No results for input(s): INR, PROTIME in the last 168 hours. Cardiac Enzymes: No results for input(s): CKTOTAL, CKMB, CKMBINDEX, TROPONINI in the last 168 hours. BNP (last 3 results) No results for input(s): PROBNP in the last 8760 hours. HbA1C: No results for input(s): HGBA1C in the last 72 hours. CBG: Recent Labs  Lab 02/14/24 1720 02/14/24 2128 02/15/24 0013 02/15/24 0451 02/15/24 0724  GLUCAP 133* 148* 125* 104* 138*   Lipid Profile: No results for input(s): CHOL, HDL,  LDLCALC, TRIG, CHOLHDL, LDLDIRECT in the last 72 hours. Thyroid Function Tests: No results for input(s): TSH, T4TOTAL, FREET4, T3FREE, THYROIDAB in the last 72 hours. Anemia Panel: No results for input(s): VITAMINB12, FOLATE, FERRITIN, TIBC, IRON, RETICCTPCT in the last 72 hours. Sepsis Labs: Recent Labs  Lab 02/09/24 0443  PROCALCITON 0.25    Recent Results (from the past 240 hours)  Respiratory (~20 pathogens) panel by PCR     Status: None   Collection Time: 02/07/24  4:49 AM   Specimen: Nasopharyngeal Swab; Respiratory  Result Value Ref Range Status   Adenovirus NOT DETECTED NOT DETECTED Final   Coronavirus 229E NOT DETECTED NOT DETECTED Final    Comment: (NOTE) The Coronavirus on the Respiratory Panel, DOES NOT test for the novel  Coronavirus (2019 nCoV)  Coronavirus HKU1 NOT DETECTED NOT DETECTED Final   Coronavirus NL63 NOT DETECTED NOT DETECTED Final   Coronavirus OC43 NOT DETECTED NOT DETECTED Final   Metapneumovirus NOT DETECTED NOT DETECTED Final   Rhinovirus / Enterovirus NOT DETECTED NOT DETECTED Final   Influenza A NOT DETECTED NOT DETECTED Final   Influenza B NOT DETECTED NOT DETECTED Final   Parainfluenza Virus 1 NOT DETECTED NOT DETECTED Final   Parainfluenza Virus 2 NOT DETECTED NOT DETECTED Final   Parainfluenza Virus 3 NOT DETECTED NOT DETECTED Final   Parainfluenza Virus 4 NOT DETECTED NOT DETECTED Final   Respiratory Syncytial Virus NOT DETECTED NOT DETECTED Final   Bordetella pertussis NOT DETECTED NOT DETECTED Final   Bordetella Parapertussis NOT DETECTED NOT DETECTED Final   Chlamydophila pneumoniae NOT DETECTED NOT DETECTED Final   Mycoplasma pneumoniae NOT DETECTED NOT DETECTED Final    Comment: Performed at [facility], [address], [place], [place] [address]  MRSA Next Gen by PCR, Nasal     Status: None   Collection Time: 02/07/24  4:49 AM   Specimen: Nasal Mucosa; Nasal Swab  Result Value Ref Range  Status   MRSA by PCR Next Gen NOT DETECTED NOT DETECTED Final    Comment: (NOTE) The GeneXpert MRSA Assay (FDA approved for NASAL specimens only), is one component of a comprehensive MRSA colonization surveillance program. It is not intended to diagnose MRSA infection nor to guide or monitor treatment for MRSA infections. Test performance is not FDA approved in patients less than 66 years old. Performed at [facility], [address] [place], [place] [address]   Culture, blood (Routine X 2) w Reflex to ID Panel     Status: None   Collection Time: 02/07/24  6:11 AM   Specimen: BLOOD  Result Value Ref Range Status   Specimen Description BLOOD BLOOD RIGHT ARM  Final   Special Requests   Final    BOTTLES DRAWN AEROBIC AND ANAEROBIC Blood Culture results may not be optimal due to an inadequate volume of blood received in culture bottles   Culture   Final    NO GROWTH 5 DAYS Performed at [facility], [address] [place], [place] [address]    Report Status 02/12/2024 FINAL  Final  Culture, blood (Routine X 2) w Reflex to ID Panel     Status: None   Collection Time: 02/07/24  6:18 AM   Specimen: BLOOD  Result Value Ref Range Status   Specimen Description BLOOD BLOOD LEFT ARM  Final   Special Requests   Final    BOTTLES DRAWN AEROBIC AND ANAEROBIC Blood Culture adequate volume   Culture   Final    NO GROWTH 5 DAYS Performed at [facility], [address] [place], [place] [address]    Report Status 02/12/2024 FINAL  Final  Culture, Respiratory w Gram Stain     Status: None   Collection Time: 02/07/24 10:48 AM   Specimen: Tracheal Aspirate; Respiratory  Result Value Ref Range Status   Specimen Description   Final    TRACHEAL ASPIRATE Performed at [facility], [address], [place], [place] [address]    Special Requests   Final    NONE Performed at [facility], [address] [place], [place] [address]    Gram Stain    Final    FEW WBC PRESENT, PREDOMINANTLY PMN RARE GRAM POSITIVE COCCI Performed at [facility], [address], [place],  [place] [address]    Culture RARE STREPTOCOCCUS PNEUMONIAE  Final   Report Status 02/10/2024 FINAL  Final   Organism ID, Bacteria STREPTOCOCCUS PNEUMONIAE  Final      Susceptibility   Streptococcus pneumoniae - MIC*    ERYTHROMYCIN >=8 RESISTANT Resistant     LEVOFLOXACIN  0.5 SENSITIVE Sensitive     VANCOMYCIN 0.5 SENSITIVE Sensitive     PENICILLIN (meningitis) 1 RESISTANT Resistant     PENO - penicillin 1      PENICILLIN (non-meningitis) 1 SENSITIVE Sensitive     PENICILLIN (oral) 1 INTERMEDIATE Intermediate     CEFTRIAXONE (non-meningitis) 1 SENSITIVE Sensitive     CEFTRIAXONE (meningitis) 1 INTERMEDIATE Intermediate     * RARE STREPTOCOCCUS PNEUMONIAE         Radiology Studies: US  Carotid Bilateral Result Date: 02/14/2024 CLINICAL DATA:  Stroke EXAM: BILATERAL CAROTID DUPLEX ULTRASOUND TECHNIQUE: Elnor scale imaging, color Doppler and duplex ultrasound were performed of bilateral carotid and vertebral arteries in the neck. COMPARISON:  None Available. FINDINGS: Criteria: Quantification of carotid stenosis is based on velocity parameters that correlate the residual internal carotid diameter with NASCET-based stenosis levels, using the diameter of the distal internal carotid lumen as the denominator for stenosis measurement. The following velocity measurements were obtained: RIGHT ICA: 101/36 cm/sec CCA: 56/11 cm/sec SYSTOLIC ICA/CCA RATIO:  1.8 ECA:  135 cm/sec LEFT ICA: 58/20 cm/sec CCA: 47/10 cm/sec SYSTOLIC ICA/CCA RATIO:  1.2 ECA:  73 cm/sec RIGHT CAROTID ARTERY: Trace smooth heterogeneous atherosclerotic plaque in the proximal internal carotid artery. By peak systolic velocity criteria, the estimated stenosis is less than 50%. RIGHT VERTEBRAL ARTERY:  Patent with normal antegrade flow. LEFT CAROTID ARTERY: Trace atherosclerotic plaque in the internal carotid  bulb without evidence of stenosis. LEFT VERTEBRAL ARTERY:  Patent with normal antegrade flow. IMPRESSION: 1. Mild (1-49%) stenosis proximal right internal carotid artery secondary to heterogenous atherosclerotic plaque. 2. Trace atherosclerotic plaque in the left internal carotid bulb without evidence of stenosis. 3. Vertebral arteries are patent with normal antegrade flow. Electronically Signed   By: Wilkie Lent M.D.   On: 02/14/2024 16:16        Scheduled Meds:  amLODipine  10 mg Oral Daily   B-complex with vitamin C  1 tablet Oral Daily   Chlorhexidine Gluconate Cloth  6 each Topical Daily   cyanocobalamin  1,000 mcg Intramuscular Daily   enoxaparin  (LOVENOX ) injection  1 mg/kg Subcutaneous Q12H   folic acid   1 mg Oral Daily   insulin aspart  0-15 Units Subcutaneous Q4H   insulin glargine-yfgn  10 Units Subcutaneous Daily   labetalol   200 mg Oral BID   multivitamin with minerals  1 tablet Oral Daily   nicotine  21 mg Transdermal Daily   pantoprazole  (PROTONIX ) IV  40 mg Intravenous Q12H   thiamine  (VITAMIN B1) injection  100 mg Intravenous Q24H   Continuous Infusions:  cefTRIAXone (ROCEPHIN)  IV 2 g (02/15/24 0028)     LOS: 11 days       Anthony CHRISTELLA Pouch, MD [facility] Pager [phone]  If 7PM-7AM, please contact night-coverage [email or web address] 02/15/2024, 9:08 AM

## 2024-02-15 NOTE — Progress Notes (Addendum)
 Speech Language Pathology Treatment: Dysphagia  Patient Details Name: Edward Weber MRN: [id number] DOB: [birth year] Today's Date: 02/15/2024 Time: 8869-8769 SLP Time Calculation (min) (ACUTE ONLY): 60 min  Assessment / Plan / Recommendation Clinical Impression  Pt seen for ongoing assessment of swallowing and po trials to upgrade to oral diet today. Pt was more awake/alert to simple/basic questions re: self and few Orientation questions; MD present and agreed. Pt was min slow to respond verbally at times w/ mumbled speech significantly impacting intelligibility; few casual speech statements including: I dreamed about having a Pepsi last night. Pt stated he lived in [place]; year was 2025; Ginnie works downstairs here at [employer]. On RA, afebrile.   Pt appears to present w/ grossly functional oropharyngeal phase swallowing w/ trial consistencies given(Puree, thins) w/ No gross oropharyngeal phase dysphagia noted, No immediate, overt clinical s/s of aspiration during po trials.  Pt appears at risk for aspiration/aspiration pneumonia in setting of his current factors that could impact swallowing, but risk can be reduced when following general aspiration precautions w/ a modified, dysphagia diet. Challenging factors include: deconditioning/weakness, inability to self-feed currently, recent agitation this admit requring intubation/extubation, ETOH and substance abuse, unknown Cognitive functioning PTA, poor/missing Most Dentition, need for Full Feeding Assistance; GERD. These factors can also impact oral intake overall.   Post full assistance w/ positioning upright in bed for po intake, pt was fed, and consumed, consistencies of ice chips, thin liquids via tsp and pinched straw, and purees. No overt coughing, decline in vocal quality, or change in respiratory presentation during/post trials. O2 sats 98% when checked. Oral phase appeared grossly [employer] w/ timely bolus management and control of bolus  propulsion for A-P transfer for swallowing. Oral clearing achieved w/ all trial consistencies given Time intermittently b/t trials. Pt did not attempt to feed self.   Recommend initiation of a Dysphagia level 1 (PUREE) consistency diet w/ well-moistened foods; Thin liquids -- carefully monitor straw use(pinched straw for Small sips Slowly), and pt should help to Hold Cup when drinking. Recommend general aspiration precautions, including being Fully Alert/Awake and sitting Upright for all oral intake. Feeding Assistance w/ oral intake. Pills CRUSHED in Puree for safer, easier swallowing.   Education given on assessment results and recommendations including CRUSHED Pills in Puree; food consistencies; aspiration precautions to pt and Sister. ST services will continue to follow pt for ongoing assessment of swallowing and readiness for diet upgrade. MD/ NSG updated, agreed. Precautions posted in room, chart. Recommend Dietician f/u for support.     HPI HPI: Per critical care progress note, 63 y.o male with significant PMH/Current of HTN, CVA, sickle cell anemia, GERD, polysubstance abuse (EtOH abuse disorder, cocaine, marijuana tobacco abuse), Neuromuscular disorder, and recurrent syncope who presented to the ED with chief complaints of syncopal episode.  Per chart review, he has been having exertional chest pain, DOP for the past few days and today felt dizzy and passed out without hitting his head..  Pt was transferred to CCU and intubated for airway protection on 10/26; extubaed on 11/2 when calmer State.   MRI 11/1:  No acute infarct. No acute intracranial hemorrhage. No mass  or abnormal enhancement. No midline shift. No hydrocephalus. Linear focus of diffusion-weighted hyperintensity in the left centrum semiovale without corresponding ADC reduction, possibly late subacute ischemia.  Previous Head CT revealed: advanced chronic small vessel disease.   CXR at admit: no acute findings.   OF NOTE: Psychiatry  was consulted for agitation and threatening to leave AMA. Due  to history of polysubstance abuse and AMS; patient was IVCd.      SLP Plan  Continue with current plan of care          Recommendations  Diet recommendations: Dysphagia 1 (puree);Thin liquid Liquids provided via: Cup;Straw (pinched straw) Medication Administration: Crushed with puree Supervision: Staff to assist with self feeding;Full supervision/cueing for compensatory strategies Compensations: Minimize environmental distractions;Slow rate;Small sips/bites;Lingual sweep for clearance of pocketing;Follow solids with liquid Postural Changes and/or Swallow Maneuvers: Out of bed for meals;Seated upright 90 degrees;Upright 30-60 min after meal (REFLUX precs.)                 (Dietician f/u) Oral care BID;Oral care before and after PO;Staff/trained caregiver to provide oral care   Frequent or constant Supervision/Assistance Dysphagia, unspecified (R13.10) (in setting of agitation requring intubation/extubation; unknown Cognitive functioning PTA; poor/missing Most Dentition; need for Full Feeding Assistance; GERD)     Continue with current plan of care       Comer Portugal, MS, CCC-SLP Speech Language Pathologist Rehab Services; [facility] [phone] (ascom) Lexianna Weinrich  02/15/2024, 3:14 PM

## 2024-02-15 NOTE — Consult Note (Signed)
 [facility] Psychiatric Consult Initial  Patient Name: .Edward Weber  MRN: [id number]  DOB: 07-06-1960  Consult Order details:  Orders (From admission, onward)     Start     Ordered   02/04/24 1154  IP CONSULT TO PSYCHIATRY       Ordering Provider: Sonjia Held, MD  Provider:  (Not yet assigned)  Question Answer Comment  Location [facility]   Reason for Consult? aggressiveness, refusing medical care, unsafe with ambulation, IVced for safety      02/04/24 1154             Mode of Visit: In person    Psychiatry Consult Evaluation  Service Date: February 15, 2024 LOS:  LOS: 11 days  Chief Complaint agitation  Primary Psychiatric Diagnoses  Agitation AUD Polysubstance use   Assessment  Edward Weber is a 63 y.o. male admitted: Medically past medical history of hypertension, CVA, sickle cell anemia, GERD, polysubstance abuse including EtOH cocaine and marijuana presented to the ED for syncopal episode. He was intubated for airway protection on 10/26. Chest x-ray without any significant findings, and has since been extubated.  Per nursing he has not been aggressive or refusing care since then.  On exam this morning he was alert and oriented.  He is a poor historian at times but responded to questions appropriately denied SI, HI, AVH.  He was not refusing further care and was not agitated or aggressive.  Discussed with nursing who indicated there is a sitter order but the patient does not continue to require sitter.  He is irritable but not aggressive.  Primary team continues to manage delirium neurology notes have been reviewed.  UDS on arrival was positive for opioids and cannabis.  Further labs were reviewed.  Though patient was appropriately oriented they are a poor historian. Will revisit tomorrow for further assessment.   02/15/2024: Patient was seen by psychiatry on rounds today.  Patient was still noted to fall asleep intermittently during  assessment.  Patient could provide limited history today during assessment.  When asked what brought patient to the hospital, patient stated I messed up, I started hanging with friends and drinking.  Patient's sister was at bedside with patient.  Patient's sister very supportive.  Patient's sister reported that patient had 2 blood clots found in his right arm and that his left side was paralyzed from  mini strokes that were found when patient was in ICU.  Patient's sister was tearful when discussing patient's current status and reported that patient will need a lot of assistance when discharged from the hospital in regards to ADLs.  Patient's sister reported patient is normally independent with all of his ADLs and that this will be a big change to patient's daily routine.  Patient sister reported they did not know how severe patient's medical history was until this hospital admission as patient was very private about his medical care leading up to this.  Psychiatry exam was limited today due to patient's current mental status.  Psychiatry will continue to round on patient to attempt further assessment when patient is more alert and oriented.    Diagnoses:  Active Hospital problems: Principal Problem:   Alcohol withdrawal with delirium (HCC) Active Problems:   Alcohol abuse   Syncope   Essential hypertension   GERD without esophagitis   Chest pain   History of CVA (cerebrovascular accident)   T2DM (type 2 diabetes mellitus) (HCC)   Sickle cell anemia (HCC)  Chest pain with moderate risk for cardiac etiology    Plan   ## Psychiatric Medication Recommendations:  None at this time defer to primary team  ## Medical Decision Making Capacity: Not specifically addressed in this encounter  ## Further Work-up:   -- most recent Qtc 481 -- Pertinent labwork reviewed   ## Disposition:--We will continue to follow  ## Behavioral / Environmental: -Delirium Precautions: Delirium Interventions  for Nursing and Staff: - RN to open blinds every AM. - To Bedside: Glasses, hearing aide, and pt's own shoes. Make available to patients. when possible and encourage use. - Encourage po fluids when appropriate, keep fluids within reach. - OOB to chair with meals. - Passive ROM exercises to all extremities with AM & PM care. - RN to assess orientation to person, time and place QAM and PRN. - Recommend extended visitation hours with familiar family/friends as feasible. - Staff to minimize disturbances at night. Turn off television when pt asleep or when not in use.    ## Safety and Observation Level:  - Based on my clinical evaluation, I estimate the patient to be at low risk of self harm in the current setting. - At this time, we recommend  routine. This decision is based on my review of the chart including patient's history and current presentation, interview of the patient, mental status examination, and consideration of suicide risk including evaluating suicidal ideation, plan, intent, suicidal or self-harm behaviors, risk factors, and protective factors. This judgment is based on our ability to directly address suicide risk, implement suicide prevention strategies, and develop a safety plan while the patient is in the clinical setting. Please contact our team if there is a concern that risk level has changed.  CSSR Risk Category:C-SSRS RISK CATEGORY: No Risk  Thank you for this consult request. Recommendations have been communicated to the primary team.  We will continue to follow at this time.   Zelda Sharps, NP        History of Present Illness  Relevant Aspects of [facility]   Patient Report:  Patient is a 63 year old male medically admitted is a past medical history of hypertension, CVA, sickle cell anemia, GERD, polysubstance use including alcohol cocaine and marijuana.  He initially presented to the ED for syncopal episode he was later intubated for airway protection on the 26 and  was extubated yesterday.  Today he is appropriately oriented but he is a poor historian.  He denies thoughts of harming self or others.  He is frustrated with someone that he lives with and indicates he plans to pick them.  He denied all substance use despite positive drug screen.  There is no ethanol on arrival reviewed CMP as well.  Is unclear patient is interested in mental health services.  Denied depressive symptoms.  Was irritable and did not fully participate in exam.  Will continue to reassess.     Psychiatric and Social History  Psychiatric History: Patient is a poor historian will continue to address there is a history of polysubstance abuse.  Concerning for cocaine, opioids, and alcohol.  On interview today patient denied all substance use.  Patient denied psychiatric history.  Denied current thoughts of harming self or others.  Will continue to reassess. Exam Findings  Physical Exam: alert and NAD Vital Signs:  Temp:  [98.8 F (37.1 C)-99 F (37.2 C)] 99 F (37.2 C) (11/04 0724) Pulse Rate:  [85-101] 94 (11/04 0724) Resp:  [16-26] 16 (11/04 0724) BP: (136-179)/(79-94) 161/94 (11/04 0724)  SpO2:  [92 %-96 %] 92 % (11/04 0724) Weight:  [82.9 kg] 82.9 kg (11/04 0500) Blood pressure (!) 161/94, pulse 94, temperature 99 F (37.2 C), resp. rate 16, height 5' 8 (1.727 m), weight 82.9 kg, SpO2 92%. Body mass index is 27.79 kg/m.    Mental Status Exam: General Appearance: Disheveled  Orientation:  UTA  Memory:  Immediate;   Poor  Concentration:  Concentration: Fair  Recall:  Poor  Attention  Poor  Eye Contact:  Minimal  Speech:  Garbled  Language:  Fair  Volume:  Decreased  Mood: UTA  Affect:  Congruent  Thought Process:  UTA  Thought Content:  UTA  Suicidal Thoughts:  No  Homicidal Thoughts:  No  Judgement:  Poor  Insight:  Lacking  Psychomotor Activity:  NA  Akathisia:  NA  Fund of Knowledge:  Poor      Assets:  Desire for Improvement      AIMS (if indicated):         Other History   These have been pulled in through the EMR, reviewed, and updated if appropriate.  Family History:  The patient's family history is not on file.  Medical History: Past Medical History:  Diagnosis Date   Alcoholism (HCC)    Allergic rhinitis    Allergic state    Anemia    Arthritis    Cancer (HCC)    Chronic hepatitis C (HCC)    Depression    Esophagitis, reflux    GERD (gastroesophageal reflux disease)    Gout    Helicobacter pylori gastritis    Hepatitis    Hypertension    Neuromuscular disorder (HCC)    Osteoarthrosis    Peripheral neuropathy    Rhabdomyolysis    Sickle cell anemia (HCC)    Stroke (HCC)    Stroke [facility])    Vitiligo     Surgical History: Past Surgical History:  Procedure Laterality Date   COLON SURGERY     COLONOSCOPY WITH PROPOFOL  N/A 05/09/2015   Procedure: COLONOSCOPY WITH PROPOFOL ;  Surgeon: Deward CINDERELLA Piedmont, MD;  Location: [facility] ENDOSCOPY;  Service: Gastroenterology;  Laterality: N/A;   ESOPHAGOGASTRODUODENOSCOPY N/A 05/09/2015   Procedure: ESOPHAGOGASTRODUODENOSCOPY (EGD);  Surgeon: Deward CINDERELLA Piedmont, MD;  Location: [facility] ENDOSCOPY;  Service: Gastroenterology;  Laterality: N/A;   ESOPHAGOGASTRODUODENOSCOPY (EGD) WITH PROPOFOL  N/A 05/04/2017   Procedure: ESOPHAGOGASTRODUODENOSCOPY (EGD) WITH PROPOFOL ;  Surgeon: Toledo, Ladell POUR, MD;  Location: [facility] ENDOSCOPY;  Service: Gastroenterology;  Laterality: N/A;   FINGER DEBRIDEMENT  1976   s/p infected dog bite   TEE WITHOUT CARDIOVERSION N/A 05/20/2012   Procedure: TRANSESOPHAGEAL ECHOCARDIOGRAM (TEE);  Surgeon: Vina LULLA Gull, MD;  Location: [facility] ENDOSCOPY;  Service: Cardiovascular;  Laterality: N/A;     Medications:   Current Facility-Administered Medications:    acetaminophen  (TYLENOL ) tablet 650 mg, 650 mg, Oral, Q6H PRN, 650 mg at 02/10/24 1204 **OR** acetaminophen  (TYLENOL ) suppository 650 mg, 650 mg, Rectal, Q6H PRN, Pokhrel, Laxman, MD   amLODipine (NORVASC) tablet 10 mg, 10 mg, Oral,  Daily, Chappell, Alex B, RPH, 10 mg at 02/15/24 1012   B-complex with vitamin C tablet 1 tablet, 1 tablet, Oral, Daily, Chappell, Alex B, RPH, 1 tablet at 02/15/24 1013   cefTRIAXone (ROCEPHIN) 2 g in sodium chloride  0.9 % 100 mL IVPB, 2 g, Intravenous, Q24H, Assaker, Jean-Pierre, MD, Last Rate: 200 mL/hr at 02/15/24 0028, 2 g at 02/15/24 0028   Chlorhexidine Gluconate Cloth 2 % PADS 6 each, 6 each, Topical, Daily, Assaker, Darrin, MD,  6 each at 02/14/24 2000   cyanocobalamin (VITAMIN B12) injection 1,000 mcg, 1,000 mcg, Intramuscular, Daily, Assaker, Jean-Pierre, MD, 1,000 mcg at 02/15/24 0028   enoxaparin  (LOVENOX ) injection 85 mg, 1 mg/kg, Subcutaneous, Q12H, Assaker, Jean-Pierre, MD, 85 mg at 02/15/24 0115   fentaNYL  (SUBLIMAZE ) injection 25-50 mcg, 25-50 mcg, Intravenous, Q2H PRN, Ouma, Elizabeth Achieng, NP, 50 mcg at 02/12/24 1407   folic acid  (FOLVITE ) tablet 1 mg, 1 mg, Oral, Daily, Chappell, Alex B, RPH, 1 mg at 02/15/24 1013   hydrALAZINE  (APRESOLINE ) injection 10-20 mg, 10-20 mg, Intravenous, Q4H PRN, Rust-Chester, Britton L, NP, 20 mg at 02/14/24 0447   insulin aspart (novoLOG) injection 0-15 Units, 0-15 Units, Subcutaneous, Q4H, Keene, Jeremiah D, NP, 2 Units at 02/15/24 1011   insulin glargine-yfgn (SEMGLEE) injection 10 Units, 10 Units, Subcutaneous, Daily, Assaker, Jean-Pierre, MD, 10 Units at 02/15/24 1013   ipratropium-albuterol  (DUONEB) 0.5-2.5 (3) MG/3ML nebulizer solution 3 mL, 3 mL, Nebulization, Q6H PRN, Ouma, Almarie Bake, NP   labetalol  (NORMODYNE ) injection 20 mg, 20 mg, Intravenous, Q2H PRN, Wouk, Devaughn Sayres, MD, 20 mg at 02/14/24 9088   labetalol  (NORMODYNE ) tablet 200 mg, 200 mg, Oral, BID, Chappell, Alex B, RPH, 200 mg at 02/15/24 1013   multivitamin with minerals tablet 1 tablet, 1 tablet, Oral, Daily, Pokhrel, Laxman, MD, 1 tablet at 02/15/24 1013   nicotine (NICODERM CQ - dosed in mg/24 hours) patch 21 mg, 21 mg, Transdermal, Daily, Pokhrel, Laxman, MD, 21  mg at 02/15/24 1027   Oral care mouth rinse, 15 mL, Mouth Rinse, PRN, Assaker, Jean-Pierre, MD, 15 mL at 02/13/24 2000   Oral care mouth rinse, 15 mL, Mouth Rinse, PRN, Mansy, Jan A, MD   pantoprazole  (PROTONIX ) injection 40 mg, 40 mg, Intravenous, Q12H, Ouma, Almarie Bake, NP, 40 mg at 02/15/24 1012   polyethylene glycol (MIRALAX / GLYCOLAX) packet 17 g, 17 g, Oral, Daily PRN, Pokhrel, Laxman, MD   thiamine  (VITAMIN B1) injection 100 mg, 100 mg, Intravenous, Q24H, Assaker, Jean-Pierre, MD, 100 mg at 02/15/24 1012  Allergies: Allergies  Allergen Reactions   Bee Venom Anaphylaxis   Bovine (Beef) Protein Other (See Comments)    Personal preference   Bovine (Beef) Protein-Containing Drug Products     Personal preference   Lactalbumin    Milk-Related Compounds Nausea And Vomiting    Intoloerance   Pegademase Bovine     Other reaction(s): Unknown Personal preference Personal preference Personal preference    Tilactase Nausea And Vomiting    Intoloerance   Milk (Cow) Nausea And Vomiting    Intoloerance    Zelda Sharps, NP

## 2024-02-16 DIAGNOSIS — F10931 Alcohol use, unspecified with withdrawal delirium: Secondary | ICD-10-CM | POA: Diagnosis not present

## 2024-02-16 LAB — BASIC METABOLIC PANEL WITH GFR
Anion gap: 11 (ref 5–15)
BUN: 48 mg/dL — ABNORMAL HIGH (ref 8–23)
CO2: 23 mmol/L (ref 22–32)
Calcium: 8.9 mg/dL (ref 8.9–10.3)
Chloride: 109 mmol/L (ref 98–111)
Creatinine, Ser: 1.35 mg/dL — ABNORMAL HIGH (ref 0.61–1.24)
GFR, Estimated: 58 mL/min — ABNORMAL LOW (ref >60)
Glucose, Bld: 147 mg/dL — ABNORMAL HIGH (ref 70–99)
Potassium: 4.6 mmol/L (ref 3.5–5.1)
Sodium: 143 mmol/L (ref 135–145)

## 2024-02-16 LAB — CBC
HCT: 30.1 % — ABNORMAL LOW (ref 39.0–52.0)
Hemoglobin: 9.6 g/dL — ABNORMAL LOW (ref 13.0–17.0)
MCH: 30.4 pg (ref 26.0–34.0)
MCHC: 31.9 g/dL (ref 30.0–36.0)
MCV: 95.3 fL (ref 80.0–100.0)
Platelets: 469 K/uL — ABNORMAL HIGH (ref 150–400)
RBC: 3.16 MIL/uL — ABNORMAL LOW (ref 4.22–5.81)
RDW: 14.7 % (ref 11.5–15.5)
WBC: 14.8 K/uL — ABNORMAL HIGH (ref 4.0–10.5)
nRBC: 1.1 % — ABNORMAL HIGH (ref 0.0–0.2)

## 2024-02-16 LAB — GLUCOSE, CAPILLARY
Glucose-Capillary: 153 mg/dL — ABNORMAL HIGH (ref 70–99)
Glucose-Capillary: 125 mg/dL — ABNORMAL HIGH (ref 70–99)
Glucose-Capillary: 146 mg/dL — ABNORMAL HIGH (ref 70–99)
Glucose-Capillary: 125 mg/dL — ABNORMAL HIGH (ref 70–99)
Glucose-Capillary: 149 mg/dL — ABNORMAL HIGH (ref 70–99)
Glucose-Capillary: 125 mg/dL — ABNORMAL HIGH (ref 70–99)

## 2024-02-16 LAB — MAGNESIUM: Magnesium: 2.3 mg/dL (ref 1.7–2.4)

## 2024-02-16 MED ORDER — THIAMINE HCL 100 MG/ML IJ SOLN
500.0000 mg | Freq: Three times a day (TID) | INTRAVENOUS | Status: AC
  Administered 2024-02-16 – 2024-02-19 (×9): 500 mg via INTRAVENOUS
  Filled 2024-02-16 (×9): qty 5

## 2024-02-16 NOTE — Progress Notes (Signed)
 PROGRESS NOTE    Edward Weber  FMW:[id number] DOB: [birth year] DOA: 02/04/2024 PCP: [name], MD     Brief Narrative:   From admission h and p  Patient is a 63 years old male with past medical history of hypertension, stroke, history of sickle cell anemia, GERD polysubstance abuse presented to the hospital with complaints of dizziness and passing out spell while he was trying to walk to his bathroom.  He also had some shortness of breath and exertional chest pain and dyspnea on exertion for past few days.  Family was able to catch him and lowered him to the floor but patient complains of pain on the back.  There was report that recently his medications including gabapentin  and amlodipine were removed.  Patient states that he has been smoking cigarettes and he uses cocaine.  Denies any nausea vomiting or abdominal pain.  Denies any urinary urgency frequency or dysuria.  Denies any dizziness lightheadedness currently.  Denies any sick contacts or recent travel.   In the ED, patient had elevated blood pressure at 203/102.  Afebrile with a temperature of 98.1 F.  Labs were notable for normal WBC with hemoglobin of 11.5.  BMP with creatinine of 1.0.  BNP within normal range at 81.  COVID influenza and RSV was negative.  Chest x-ray showed no acute infiltrate.  CT angiogram of the chest showed no evidence of pulmonary embolism.  CT head scan was negative for acute findings.  Patient was then considered for admission to hospital for syncope.  Assessment & Plan:   Principal Problem:   Alcohol withdrawal with delirium (HCC) Active Problems:   Syncope   Chest pain   Alcohol abuse   Essential hypertension   GERD without esophagitis   History of CVA (cerebrovascular accident)   T2DM (type 2 diabetes mellitus) (HCC)   Sickle cell anemia (HCC)   Chest pain with moderate risk for cardiac etiology  # Alcohol use disorder with complicated withdrawal Developed agitation and delirium day of  admission. Per daughter heavy drinker with history of withdrawal, starts drinking first thing in the morning. Now no longer withdrawing - continue vitamins  # Acute respiratory failure Rapid response 10/26, intubated for airway protection, now extubated and on room air - monitor  # RUE DVT Presumed 2/2 PIV now removed - will ask vascular to review - currently on treatment dose lovenox   # Subacute CVA Front parietal region, no sig findings on TTE, dopplers. Neuro has signed off - asa if off anticoagulation - statin when safe for it  # Strep pneumo pneumonia S/p treatment with ceftriaxone  # Delirium Appears to be improving - given etoh abuse will give 3 days high dose thiamine   # Syncope Suspect related to substance abuse. CT head nothing acute. EKG nothing acute  # Chest pain Negative w/u with serial troponins and echocardiogram and EKG - monitor  # Substance abuse Alcohol, per h and p patient admitted to cocaine abuse as well. Uds positive for opioids and thc  # sickle cell anemia ruled out Carries this diagnosis but electrophoresis here normal  # T2DM Appears to be a new diagnosis, A1c in the 8s. Glucose here mild elevation - monitor  # HTN Labile BPs - home amlodipine, lisinopril, hydrochlorothiazide  - labetalol  prn  # Neuropathy - home gabapentin   # Aortic dilation Incidental on CTA, 4 cm - outpt surveillance  # Alcohol liver disease Cirrhotic liver on CT but no laboratory evidence of such. Hcv rna neg (  prior resolved infection) - outpt f/u  # Debility - for snf    DVT prophylaxis: lovenox  Code Status: full Family Communication: daughter shereda called, message left on 11/5  Level of care: Progressive Status is: Inpatient     Consultants:  Psychiatry, neurology, pccm  Procedures: S/p intubation  Antimicrobials:  See above    Subjective: No complaints  Objective: Vitals:   02/16/24 0305 02/16/24 0439 02/16/24 0734 02/16/24 1136   BP: 133/75  (!) 156/98 128/89  Pulse: 96  96 85  Resp: 20  14 16   Temp: 98.6 F (37 C)  99.5 F (37.5 C) 98 F (36.7 C)  TempSrc:   Oral Oral  SpO2: 90%  95% 98%  Weight:  83.8 kg    Height:        Intake/Output Summary (Last 24 hours) at 02/16/2024 1427 Last data filed at 02/16/2024 0307 Gross per 24 hour  Intake 480 ml  Output 1425 ml  Net -945 ml   Filed Weights   02/14/24 0500 02/15/24 0500 02/16/24 0439  Weight: 81.9 kg 82.9 kg 83.8 kg    Examination:  General exam: Appears calm and comfortable, no complaints Respiratory system: scattered rhonchi otherwise clear Cardiovascular system: S1 & S2 heard, RRR.  Gastrointestinal system: Abdomen is obese, soft and nontender.   Central nervous system: moving all 4, slow to respond, oriented to person, place, time Extremities: warm Skin: No rashes, lesions or ulcers. Depigmented skin Psychiatry: calm    Data Reviewed: I have personally reviewed following labs and imaging studies  CBC: Recent Labs  Lab 02/12/24 0535 02/13/24 0340 02/14/24 0519 02/15/24 0600 02/16/24 0441  WBC 13.0* 11.1* 13.4* 12.3* 14.8*  HGB 9.8* 8.8* 9.7* 10.4* 9.6*  HCT 30.7* 27.5* 30.5* 33.5* 30.1*  MCV 95.9 95.2 95.6 98.0 95.3  PLT 332 331 420* 432* 469*   Basic Metabolic Panel: Recent Labs  Lab 02/10/24 0439 02/11/24 0614 02/12/24 0535 02/13/24 0340 02/14/24 0519 02/15/24 0600 02/16/24 0441  NA 142 143 140 144 144 147* 143  K 4.6 4.3 4.3 4.6 4.4 4.8 4.6  CL 109 109 107 109 106 110 109  CO2 24 23 23 24 24 23 23   GLUCOSE 248* 230* 233* 255* 143* 127* 147*  BUN 32* 35* 36* 39* 39* 42* 48*  CREATININE 1.15 1.01 1.12 1.11 1.09 1.22 1.35*  CALCIUM  8.6* 8.5* 8.7* 8.9 9.2 9.0 8.9  MG 2.4 2.3 2.3 2.2 2.3 2.4 2.3  PHOS 3.3 2.9 3.6 3.9  --   --   --    GFR: Estimated Creatinine Clearance: 57.6 mL/min (A) (by C-G formula based on SCr of 1.35 mg/dL (H)). Liver Function Tests: Recent Labs  Lab 02/13/24 0340  ALBUMIN 2.5*   No  results for input(s): LIPASE, AMYLASE in the last 168 hours. Recent Labs  Lab 02/10/24 0957  AMMONIA <13   Coagulation Profile: No results for input(s): INR, PROTIME in the last 168 hours. Cardiac Enzymes: No results for input(s): CKTOTAL, CKMB, CKMBINDEX, TROPONINI in the last 168 hours. BNP (last 3 results) No results for input(s): PROBNP in the last 8760 hours. HbA1C: No results for input(s): HGBA1C in the last 72 hours.  CBG: Recent Labs  Lab 02/15/24 1936 02/15/24 2332 02/16/24 0312 02/16/24 0732 02/16/24 1138  GLUCAP 233* 86 153* 125* 146*   Lipid Profile: No results for input(s): CHOL, HDL, LDLCALC, TRIG, CHOLHDL, LDLDIRECT in the last 72 hours. Thyroid Function Tests: No results for input(s): TSH, T4TOTAL, FREET4, T3FREE, THYROIDAB in the  last 72 hours.  Anemia Panel: No results for input(s): VITAMINB12, FOLATE, FERRITIN, TIBC, IRON, RETICCTPCT in the last 72 hours. Urine analysis:    Component Value Date/Time   COLORURINE STRAW (A) 02/04/2024 1516   APPEARANCEUR CLEAR (A) 02/04/2024 1516   APPEARANCEUR Clear 01/17/2020 1550   LABSPEC 1.021 02/04/2024 1516   LABSPEC 1.010 04/01/2014 1721   PHURINE 7.0 02/04/2024 1516   GLUCOSEU NEGATIVE 02/04/2024 1516   GLUCOSEU Negative 04/01/2014 1721   HGBUR NEGATIVE 02/04/2024 1516   BILIRUBINUR NEGATIVE 02/04/2024 1516   BILIRUBINUR Negative 01/17/2020 1550   BILIRUBINUR Negative 04/01/2014 1721   KETONESUR NEGATIVE 02/04/2024 1516   PROTEINUR 100 (A) 02/04/2024 1516   NITRITE NEGATIVE 02/04/2024 1516   LEUKOCYTESUR NEGATIVE 02/04/2024 1516   LEUKOCYTESUR Negative 04/01/2014 1721   Sepsis Labs: @LABRCNTIP (procalcitonin:4,lacticidven:4)  ) Recent Results (from the past 240 hours)  Respiratory (~20 pathogens) panel by PCR     Status: None   Collection Time: 02/07/24  4:49 AM   Specimen: Nasopharyngeal Swab; Respiratory  Result Value Ref Range Status    Adenovirus NOT DETECTED NOT DETECTED Final   Coronavirus 229E NOT DETECTED NOT DETECTED Final    Comment: (NOTE) The Coronavirus on the Respiratory Panel, DOES NOT test for the novel  Coronavirus (2019 nCoV)    Coronavirus HKU1 NOT DETECTED NOT DETECTED Final   Coronavirus NL63 NOT DETECTED NOT DETECTED Final   Coronavirus OC43 NOT DETECTED NOT DETECTED Final   Metapneumovirus NOT DETECTED NOT DETECTED Final   Rhinovirus / Enterovirus NOT DETECTED NOT DETECTED Final   Influenza A NOT DETECTED NOT DETECTED Final   Influenza B NOT DETECTED NOT DETECTED Final   Parainfluenza Virus 1 NOT DETECTED NOT DETECTED Final   Parainfluenza Virus 2 NOT DETECTED NOT DETECTED Final   Parainfluenza Virus 3 NOT DETECTED NOT DETECTED Final   Parainfluenza Virus 4 NOT DETECTED NOT DETECTED Final   Respiratory Syncytial Virus NOT DETECTED NOT DETECTED Final   Bordetella pertussis NOT DETECTED NOT DETECTED Final   Bordetella Parapertussis NOT DETECTED NOT DETECTED Final   Chlamydophila pneumoniae NOT DETECTED NOT DETECTED Final   Mycoplasma pneumoniae NOT DETECTED NOT DETECTED Final    Comment: Performed at [facility], [address], [place], [place] [address]  MRSA Next Gen by PCR, Nasal     Status: None   Collection Time: 02/07/24  4:49 AM   Specimen: Nasal Mucosa; Nasal Swab  Result Value Ref Range Status   MRSA by PCR Next Gen NOT DETECTED NOT DETECTED Final    Comment: (NOTE) The GeneXpert MRSA Assay (FDA approved for NASAL specimens only), is one component of a comprehensive MRSA colonization surveillance program. It is not intended to diagnose MRSA infection nor to guide or monitor treatment for MRSA infections. Test performance is not FDA approved in patients less than 64 years old. Performed at [facility], [address] [place], [place] [address]   Culture, blood (Routine X 2) w Reflex to ID Panel     Status: None   Collection Time: 02/07/24  6:11 AM   Specimen: BLOOD   Result Value Ref Range Status   Specimen Description BLOOD BLOOD RIGHT ARM  Final   Special Requests   Final    BOTTLES DRAWN AEROBIC AND ANAEROBIC Blood Culture results may not be optimal due to an inadequate volume of blood received in culture bottles   Culture   Final    NO GROWTH 5 DAYS Performed at [facility], [address], [place], [place] [address]    Report Status 02/12/2024 FINAL  Final  Culture, blood (Routine X 2) w Reflex to ID Panel     Status: None   Collection Time: 02/07/24  6:18 AM   Specimen: BLOOD  Result Value Ref Range Status   Specimen Description BLOOD BLOOD LEFT ARM  Final   Special Requests   Final    BOTTLES DRAWN AEROBIC AND ANAEROBIC Blood Culture adequate volume   Culture   Final    NO GROWTH 5 DAYS Performed at [facility], [address], [place], [place] [address]    Report Status 02/12/2024 FINAL  Final  Culture, Respiratory w Gram Stain     Status: None   Collection Time: 02/07/24 10:48 AM   Specimen: Tracheal Aspirate; Respiratory  Result Value Ref Range Status   Specimen Description   Final    TRACHEAL ASPIRATE Performed at [facility], [address] [place], [place] [address]    Special Requests   Final    NONE Performed at [facility], [address] [place], [place] [address]    Gram Stain   Final    FEW WBC PRESENT, PREDOMINANTLY PMN RARE GRAM POSITIVE COCCI Performed at [facility], [address] [place], [place] [address]    Culture RARE STREPTOCOCCUS PNEUMONIAE  Final   Report Status 02/10/2024 FINAL  Final   Organism ID, Bacteria STREPTOCOCCUS PNEUMONIAE  Final      Susceptibility   Streptococcus pneumoniae - MIC*    ERYTHROMYCIN >=8 RESISTANT Resistant     LEVOFLOXACIN  0.5 SENSITIVE Sensitive     VANCOMYCIN 0.5 SENSITIVE Sensitive     PENICILLIN (meningitis) 1 RESISTANT Resistant     PENO - penicillin 1      PENICILLIN (non-meningitis) 1 SENSITIVE Sensitive      PENICILLIN (oral) 1 INTERMEDIATE Intermediate     CEFTRIAXONE (non-meningitis) 1 SENSITIVE Sensitive     CEFTRIAXONE (meningitis) 1 INTERMEDIATE Intermediate     * RARE STREPTOCOCCUS PNEUMONIAE         Radiology Studies: US  Carotid Bilateral Result Date: 02/14/2024 CLINICAL DATA:  Stroke EXAM: BILATERAL CAROTID DUPLEX ULTRASOUND TECHNIQUE: Elnor scale imaging, color Doppler and duplex ultrasound were performed of bilateral carotid and vertebral arteries in the neck. COMPARISON:  None Available. FINDINGS: Criteria: Quantification of carotid stenosis is based on velocity parameters that correlate the residual internal carotid diameter with NASCET-based stenosis levels, using the diameter of the distal internal carotid lumen as the denominator for stenosis measurement. The following velocity measurements were obtained: RIGHT ICA: 101/36 cm/sec CCA: 56/11 cm/sec SYSTOLIC ICA/CCA RATIO:  1.8 ECA:  135 cm/sec LEFT ICA: 58/20 cm/sec CCA: 47/10 cm/sec SYSTOLIC ICA/CCA RATIO:  1.2 ECA:  73 cm/sec RIGHT CAROTID ARTERY: Trace smooth heterogeneous atherosclerotic plaque in the proximal internal carotid artery. By peak systolic velocity criteria, the estimated stenosis is less than 50%. RIGHT VERTEBRAL ARTERY:  Patent with normal antegrade flow. LEFT CAROTID ARTERY: Trace atherosclerotic plaque in the internal carotid bulb without evidence of stenosis. LEFT VERTEBRAL ARTERY:  Patent with normal antegrade flow. IMPRESSION: 1. Mild (1-49%) stenosis proximal right internal carotid artery secondary to heterogenous atherosclerotic plaque. 2. Trace atherosclerotic plaque in the left internal carotid bulb without evidence of stenosis. 3. Vertebral arteries are patent with normal antegrade flow. Electronically Signed   By: Wilkie Lent M.D.   On: 02/14/2024 16:16        Scheduled Meds:  amLODipine  10 mg Oral Daily  B-complex with vitamin C  1 tablet Oral Daily   Chlorhexidine Gluconate Cloth  6 each Topical  Daily   vitamin B-12  1,000 mcg Oral Daily   enoxaparin  (LOVENOX ) injection  1 mg/kg Subcutaneous Q12H   feeding supplement (KATE FARMS STANDARD ENT 1.4)  325 mL Oral TID BM   folic acid   1 mg Oral Daily   insulin aspart  0-15 Units Subcutaneous Q4H   insulin glargine-yfgn  10 Units Subcutaneous Daily   labetalol   200 mg Oral BID   multivitamin with minerals  1 tablet Oral Daily   nicotine  21 mg Transdermal Daily   pantoprazole   40 mg Oral Daily   thiamine   100 mg Oral Daily   Continuous Infusions:     LOS: 12 days     Devaughn KATHEE Ban, MD [facility]   If 7PM-7AM, please contact night-coverage [email or web address] Password [email or web address] 02/16/2024, 2:27 PM

## 2024-02-16 NOTE — Plan of Care (Signed)
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
## 2024-02-16 NOTE — Progress Notes (Signed)
 Physical Therapy Treatment Patient Details Name: Edward Weber MRN: [id number] DOB: 21-Sep-1960 Today's Date: 02/16/2024   History of Present Illness 63 y.o male with significant PMH of HTN, CVA, sickle cell anemia, GERD, polysubstance abuse (EtOH abuse disorder, cocaine, marijuana tobacco abuse), and recurrent syncope who presented to the ED with chief complaints of syncopal episode. Per chart review, he has been having exertional chest pain, DOP for the past few days and today felt dizzy and passed out without hitting his head. Pt transferred to ICU on 10/26. MRI imaging from 11/01 revealing possible subacute ischemia in left centrum semiovale. Pt recently extubated on 10/30. [name]    PT Comments  Pt seen for PT tx with pt agreeable, sister present but stepping out. Session focused on bed mobility & sitting balance. Pt requires max<>total assist for supine<>sit despite max cuing re: technique. Pt tolerated sitting EOB ~20 minutes with as little as min assist with very poor righting reactions/awareness re: LOB. Pt is able to initiate correcting balance & return to midline orientation with multimodal cuing cuing. Pt L side weaker than R, but unable to hold cup with R hand. Recommend ongoing PT services to progress mobility as able.    If plan is discharge home, recommend the following: Two people to help with walking and/or transfers;Two people to help with bathing/dressing/bathroom;Direct supervision/assist for medications management;Direct supervision/assist for financial management;Assist for transportation;Help with stairs or ramp for entrance;Supervision due to cognitive status   Can travel by private vehicle     No  Equipment Recommendations  Other (comment) (TBD)    Recommendations for Other Services       Precautions / Restrictions Precautions Precautions: Fall Restrictions Weight Bearing Restrictions Per Provider Order: No     Mobility  Bed Mobility Overal bed mobility: Needs  Assistance Bed Mobility: Rolling Rolling: Total assist, Used rails (cuing to turn to look at bed rail, attempt to grasp it with RUE)   Supine to sit: Max assist, HOB elevated, Used rails (exit L side of bed) Sit to supine: Total assist   General bed mobility comments: +2 total assist to scoot to [facility]    Transfers                        Ambulation/Gait                   Stairs             Wheelchair Mobility     Tilt Bed    Modified Rankin (Stroke Patients Only)       Balance Overall balance assessment: Needs assistance Sitting-balance support: Single extremity supported, Feet supported, Bilateral upper extremity supported Sitting balance-Leahy Scale: Poor                                      Communication Communication Communication: Impaired Factors Affecting Communication: Reduced clarity of speech  Cognition Arousal: Alert Behavior During Therapy: WFL for tasks assessed/performed   PT - Cognitive impairments: Difficult to assess                         Following commands: Impaired Following commands impaired: Follows one step commands inconsistently, Follows one step commands with increased time    Cueing Cueing Techniques: Verbal cues, Gestural cues, Visual cues  Exercises General Exercises - Lower Extremity Long Arc Quad: AROM,  Seated, Strengthening, Right, 10 reps    General Comments        Pertinent Vitals/Pain Pain Assessment Pain Assessment: Faces Faces Pain Scale: Hurts even more Pain Location: extremities with movement, repositioning in bed Pain Descriptors / Indicators: Grimacing, Discomfort Pain Intervention(s): Repositioned, Monitored during session, Limited activity within patient's tolerance    Home Living                          Prior Function            PT Goals (current goals can now be found in the care plan section) Acute Rehab PT Goals Patient Stated Goal: none  stated PT Goal Formulation: With patient Time For Goal Achievement: 02/28/24 Potential to Achieve Goals: Fair Progress towards PT goals: Progressing toward goals    Frequency    Min 2X/week      PT Plan      Co-evaluation              AM-PAC PT 6 Clicks Mobility   Outcome Measure  Help needed turning from your back to your side while in a flat bed without using bedrails?: A Lot Help needed moving from lying on your back to sitting on the side of a flat bed without using bedrails?: Total Help needed moving to and from a bed to a chair (including a wheelchair)?: Total Help needed standing up from a chair using your arms (e.g., wheelchair or bedside chair)?: Total Help needed to walk in hospital room?: Total Help needed climbing 3-5 steps with a railing? : Total 6 Click Score: 7    End of Session   Activity Tolerance: Patient tolerated treatment well Patient left: in bed;with call bell/phone within reach;with bed alarm set;with nursing/sitter in room Nurse Communication: Mobility status PT Visit Diagnosis: Muscle weakness (generalized) (M62.81);Unsteadiness on feet (R26.81);Other abnormalities of gait and mobility (R26.89);Difficulty in walking, not elsewhere classified (R26.2)     Time: 8892-8867 PT Time Calculation (min) (ACUTE ONLY): 25 min  Charges:    $Therapeutic Activity: 23-37 mins PT General Charges $$ ACUTE PT VISIT: 1 Visit                     Richerd Pinal, PT, DPT 02/16/24, 11:48 AM   Richerd CHRISTELLA Pinal 02/16/2024, 11:46 AM

## 2024-02-16 NOTE — Consult Note (Signed)
 [facility] Psychiatric Consult Initial  Patient Name: .Edward Weber  MRN: [id number]  DOB: [birth year]  Consult Order details:  Orders (From admission, onward)     Start     Ordered   02/04/24 1154  IP CONSULT TO PSYCHIATRY       Ordering Provider: Sonjia Held, MD  Provider:  (Not yet assigned)  Question Answer Comment  Location [facility]   Reason for Consult? aggressiveness, refusing medical care, unsafe with ambulation, IVced for safety      02/04/24 1154             Mode of Visit: In person    Psychiatry Consult Evaluation  Service Date: February 16, 2024 LOS:  LOS: 12 days  Chief Complaint agitation  Primary Psychiatric Diagnoses  Agitation AUD Polysubstance use   Assessment  Edward Weber is a 63 y.o. male admitted: Medically past medical history of hypertension, CVA, sickle cell anemia, GERD, polysubstance abuse including EtOH cocaine and marijuana presented to the ED for syncopal episode. He was intubated for airway protection on 10/26. Chest x-ray without any significant findings, and has since been extubated.  Per nursing he has not been aggressive or refusing care since then.  On exam this morning he was alert and oriented.  He is a poor historian at times but responded to questions appropriately denied SI, HI, AVH.  He was not refusing further care and was not agitated or aggressive.  Discussed with nursing who indicated there is a sitter order but the patient does not continue to require sitter.  He is irritable but not aggressive.  Primary team continues to manage delirium neurology notes have been reviewed.  UDS on arrival was positive for opioids and cannabis.  Further labs were reviewed.  Though patient was appropriately oriented they are a poor historian. Will revisit tomorrow for further assessment.   02/15/2024: Patient was seen by psychiatry on rounds today.  Patient was still noted to fall asleep intermittently during  assessment.  Patient could provide limited history today during assessment.  When asked what brought patient to the hospital, patient stated I messed up, I started hanging with friends and drinking.  Patient's sister was at bedside with patient.  Patient's sister very supportive.  Patient's sister reported that patient had 2 blood clots found in his right arm and that his left side was paralyzed from  mini strokes that were found when patient was in ICU.  Patient's sister was tearful when discussing patient's current status and reported that patient will need a lot of assistance when discharged from the hospital in regards to ADLs.  Patient's sister reported patient is normally independent with all of his ADLs and that this will be a big change to patient's daily routine.  Patient sister reported they did not know how severe patient's medical history was until this hospital admission as patient was very private about his medical care leading up to this.  Psychiatry exam was limited today due to patient's current mental status.  Psychiatry will continue to round on patient to attempt further assessment when patient is more alert and oriented.  02/16/2024: Patient was seen today on rounds by psychiatry.  Patient was more alert, but did appear to have trouble getting his words out.  Patient was able to shake head yes or no in response to assessment questions.  Patient shook head no when asked about suicidal or homicidal ideations.  Patient also shook his head no when asked about  auditory or visual hallucinations.  Per chart review and nursing staff, patient has not had any recent episodes of agitation.On current presentation there was no evidence of psychosis or mania and patient did not appear to be responding to internal stimuli. At this time, patient does not appear to be a risk to self or others.  Patient did shake head yes when asked if interested in substance use resources.  However currently, medically  patient is unable to use his left side of his body.  Patient will likely require rehab or other assistive services after medically discharged from hospital.  We will provide resources for patient to follow-up with, regarding substance use, but we discussed that patient would likely be referred for medical rehab acutely after discharge from hospital.    Diagnoses:  Active Hospital problems: Principal Problem:   Alcohol withdrawal with delirium (HCC) Active Problems:   Alcohol abuse   Syncope   Essential hypertension   GERD without esophagitis   Chest pain   History of CVA (cerebrovascular accident)   T2DM (type 2 diabetes mellitus) (HCC)   Sickle cell anemia (HCC)   Chest pain with moderate risk for cardiac etiology    Plan   ## Psychiatric Medication Recommendations:  None at this time defer to primary team  ## Medical Decision Making Capacity: Not specifically addressed in this encounter  ## Further Work-up:   -- most recent Qtc 481 -- Pertinent labwork reviewed   ## Disposition:--We will continue to follow  ## Behavioral / Environmental: -Delirium Precautions: Delirium Interventions for Nursing and Staff: - RN to open blinds every AM. - To Bedside: Glasses, hearing aide, and pt's own shoes. Make available to patients. when possible and encourage use. - Encourage po fluids when appropriate, keep fluids within reach. - OOB to chair with meals. - Passive ROM exercises to all extremities with AM & PM care. - RN to assess orientation to person, time and place QAM and PRN. - Recommend extended visitation hours with familiar family/friends as feasible. - Staff to minimize disturbances at night. Turn off television when pt asleep or when not in use.    ## Safety and Observation Level:  - Based on my clinical evaluation, I estimate the patient to be at low risk of self harm in the current setting. - At this time, we recommend  routine. This decision is based on my review of the chart  including patient's history and current presentation, interview of the patient, mental status examination, and consideration of suicide risk including evaluating suicidal ideation, plan, intent, suicidal or self-harm behaviors, risk factors, and protective factors. This judgment is based on our ability to directly address suicide risk, implement suicide prevention strategies, and develop a safety plan while the patient is in the clinical setting. Please contact our team if there is a concern that risk level has changed.  CSSR Risk Category:C-SSRS RISK CATEGORY: No Risk  Thank you for this consult request. Recommendations have been communicated to the primary team.  We will continue to follow at this time.   Zelda Sharps, NP        History of Present Illness  Relevant Aspects of [facility]   Patient Report:  Patient is a 63 year old male medically admitted is a past medical history of hypertension, CVA, sickle cell anemia, GERD, polysubstance use including alcohol cocaine and marijuana.  He initially presented to the ED for syncopal episode he was later intubated for airway protection on the 26 and was extubated yesterday.  Today  he is appropriately oriented but he is a poor historian.  He denies thoughts of harming self or others.  He is frustrated with someone that he lives with and indicates he plans to pick them.  He denied all substance use despite positive drug screen.  There is no ethanol on arrival reviewed CMP as well.  Is unclear patient is interested in mental health services.  Denied depressive symptoms.  Was irritable and did not fully participate in exam.  Will continue to reassess.     Psychiatric and Social History  Psychiatric History: Patient is a poor historian will continue to address there is a history of polysubstance abuse.  Concerning for cocaine, opioids, and alcohol.  On interview today patient denied all substance use.  Patient denied psychiatric history.  Denied  current thoughts of harming self or others.  Will continue to reassess. Exam Findings  Physical Exam: alert and NAD Vital Signs:  Temp:  [97.5 F (36.4 C)-99.5 F (37.5 C)] 99.5 F (37.5 C) (11/05 0734) Pulse Rate:  [85-102] 96 (11/05 0734) Resp:  [14-20] 14 (11/05 0734) BP: (129-156)/(75-98) 156/98 (11/05 0734) SpO2:  [90 %-97 %] 95 % (11/05 0734) Weight:  [83.8 kg] 83.8 kg (11/05 0439) Blood pressure (!) 156/98, pulse 96, temperature 99.5 F (37.5 C), temperature source Oral, resp. rate 14, height 5' 8 (1.727 m), weight 83.8 kg, SpO2 95%. Body mass index is 28.09 kg/m.    Mental Status Exam: General Appearance: Disheveled  Orientation:  UTA  Memory:  Immediate;   Poor  Concentration:  Concentration: Fair  Recall:  Poor  Attention  Poor  Eye Contact:  Minimal  Speech:  Garbled  Language:  Fair  Volume:  Decreased  Mood: UTA  Affect:  Congruent  Thought Process:  UTA  Thought Content:  UTA  Suicidal Thoughts:  No  Homicidal Thoughts:  No  Judgement:  Poor  Insight:  Lacking  Psychomotor Activity:  NA  Akathisia:  NA  Fund of Knowledge:  Poor      Assets:  Desire for Improvement      AIMS (if indicated):        Other History   These have been pulled in through the EMR, reviewed, and updated if appropriate.  Family History:  The patient's family history is not on file.  Medical History: Past Medical History:  Diagnosis Date   Alcoholism (HCC)    Allergic rhinitis    Allergic state    Anemia    Arthritis    Cancer (HCC)    Chronic hepatitis C (HCC)    Depression    Esophagitis, reflux    GERD (gastroesophageal reflux disease)    Gout    Helicobacter pylori gastritis    Hepatitis    Hypertension    Neuromuscular disorder (HCC)    Osteoarthrosis    Peripheral neuropathy    Rhabdomyolysis    Sickle cell anemia (HCC)    Stroke (HCC)    Stroke [facility])    Vitiligo     Surgical History: Past Surgical History:  Procedure Laterality Date   COLON  SURGERY     COLONOSCOPY WITH PROPOFOL  N/A 05/09/2015   Procedure: COLONOSCOPY WITH PROPOFOL ;  Surgeon: Deward CINDERELLA Piedmont, MD;  Location: [facility] ENDOSCOPY;  Service: Gastroenterology;  Laterality: N/A;   ESOPHAGOGASTRODUODENOSCOPY N/A 05/09/2015   Procedure: ESOPHAGOGASTRODUODENOSCOPY (EGD);  Surgeon: Deward CINDERELLA Piedmont, MD;  Location: [facility] ENDOSCOPY;  Service: Gastroenterology;  Laterality: N/A;   ESOPHAGOGASTRODUODENOSCOPY (EGD) WITH PROPOFOL  N/A 05/04/2017   Procedure:  ESOPHAGOGASTRODUODENOSCOPY (EGD) WITH PROPOFOL ;  Surgeon: Toledo, Ladell POUR, MD;  Location: [facility] ENDOSCOPY;  Service: Gastroenterology;  Laterality: N/A;   FINGER DEBRIDEMENT  1976   s/p infected dog bite   TEE WITHOUT CARDIOVERSION N/A 05/20/2012   Procedure: TRANSESOPHAGEAL ECHOCARDIOGRAM (TEE);  Surgeon: Vina LULLA Gull, MD;  Location: [facility] ENDOSCOPY;  Service: Cardiovascular;  Laterality: N/A;     Medications:   Current Facility-Administered Medications:    acetaminophen  (TYLENOL ) tablet 650 mg, 650 mg, Oral, Q6H PRN, 650 mg at 02/15/24 2128 **OR** acetaminophen  (TYLENOL ) suppository 650 mg, 650 mg, Rectal, Q6H PRN, Pokhrel, Laxman, MD   amLODipine (NORVASC) tablet 10 mg, 10 mg, Oral, Daily, Chappell, Alex B, RPH, 10 mg at 02/15/24 1012   B-complex with vitamin C tablet 1 tablet, 1 tablet, Oral, Daily, Chappell, Alex B, RPH, 1 tablet at 02/15/24 1013   Chlorhexidine Gluconate Cloth 2 % PADS 6 each, 6 each, Topical, Daily, Assaker, Darrin, MD, 6 each at 02/15/24 2056   cyanocobalamin (VITAMIN B12) tablet 1,000 mcg, 1,000 mcg, Oral, Daily, Trudy Anthony HERO, MD   enoxaparin  (LOVENOX ) injection 85 mg, 1 mg/kg, Subcutaneous, Q12H, Assaker, Jean-Pierre, MD, 85 mg at 02/16/24 0047   feeding supplement (KATE FARMS STANDARD ENT 1.4) liquid 325 mL, 325 mL, Oral, TID BM, Williams, Jamiese M, MD   fentaNYL  (SUBLIMAZE ) injection 25-50 mcg, 25-50 mcg, Intravenous, Q2H PRN, Ouma, Elizabeth Achieng, NP, 50 mcg at 02/12/24 1407   folic acid  (FOLVITE ) tablet  1 mg, 1 mg, Oral, Daily, Chappell, Alex B, RPH, 1 mg at 02/15/24 1013   hydrALAZINE  (APRESOLINE ) injection 10-20 mg, 10-20 mg, Intravenous, Q4H PRN, Rust-Chester, Britton L, NP, 20 mg at 02/14/24 0447   insulin aspart (novoLOG) injection 0-15 Units, 0-15 Units, Subcutaneous, Q4H, Keene, Jeremiah D, NP, 2 Units at 02/16/24 0859   insulin glargine-yfgn (SEMGLEE) injection 10 Units, 10 Units, Subcutaneous, Daily, Assaker, Jean-Pierre, MD, 10 Units at 02/15/24 1013   ipratropium-albuterol  (DUONEB) 0.5-2.5 (3) MG/3ML nebulizer solution 3 mL, 3 mL, Nebulization, Q6H PRN, Ouma, Almarie Bake, NP   labetalol  (NORMODYNE ) injection 20 mg, 20 mg, Intravenous, Q2H PRN, Wouk, Devaughn Sayres, MD, 20 mg at 02/14/24 0911   labetalol  (NORMODYNE ) tablet 200 mg, 200 mg, Oral, BID, Chappell, Alex B, RPH, 200 mg at 02/15/24 2101   multivitamin with minerals tablet 1 tablet, 1 tablet, Oral, Daily, Pokhrel, Laxman, MD, 1 tablet at 02/15/24 1013   nicotine (NICODERM CQ - dosed in mg/24 hours) patch 21 mg, 21 mg, Transdermal, Daily, Pokhrel, Laxman, MD, 21 mg at 02/15/24 1027   Oral care mouth rinse, 15 mL, Mouth Rinse, PRN, Assaker, Jean-Pierre, MD, 15 mL at 02/13/24 2000   Oral care mouth rinse, 15 mL, Mouth Rinse, PRN, Mansy, Jan A, MD   pantoprazole  (PROTONIX ) EC tablet 40 mg, 40 mg, Oral, Daily, Nazari, Walid A, RPH   polyethylene glycol (MIRALAX / GLYCOLAX) packet 17 g, 17 g, Oral, Daily PRN, Pokhrel, Laxman, MD   thiamine  (VITAMIN B1) tablet 100 mg, 100 mg, Oral, Daily, Trudy Anthony HERO, MD  Allergies: Allergies  Allergen Reactions   Bee Venom Anaphylaxis   Bovine (Beef) Protein Other (See Comments)    Personal preference   Bovine (Beef) Protein-Containing Drug Products     Personal preference   Lactalbumin    Milk-Related Compounds Nausea And Vomiting    Intoloerance   Pegademase Bovine     Other reaction(s): Unknown Personal preference Personal preference Personal preference    Tilactase Nausea  And Vomiting    Intoloerance  Milk (Cow) Nausea And Vomiting    Intoloerance    Zelda Sharps, NP This note was created using Scientist, clinical (histocompatibility and immunogenetics). Please excuse any inadvertent transcription errors. Case was discussed with supervising physician Dr. Jadapalle who is agreeable with current plan.

## 2024-02-17 ENCOUNTER — Inpatient Hospital Stay

## 2024-02-17 DIAGNOSIS — F10931 Alcohol use, unspecified with withdrawal delirium: Secondary | ICD-10-CM | POA: Diagnosis not present

## 2024-02-17 LAB — CBC WITH DIFFERENTIAL/PLATELET
Abs Immature Granulocytes: 1.04 K/uL — ABNORMAL HIGH (ref 0.00–0.07)
Basophils Absolute: 0.1 K/uL (ref 0.0–0.1)
Basophils Relative: 1 %
Eosinophils Absolute: 0.2 K/uL (ref 0.0–0.5)
Eosinophils Relative: 2 %
HCT: 29.6 % — ABNORMAL LOW (ref 39.0–52.0)
Hemoglobin: 9.4 g/dL — ABNORMAL LOW (ref 13.0–17.0)
Immature Granulocytes: 7 %
Lymphocytes Relative: 16 %
Lymphs Abs: 2.5 K/uL (ref 0.7–4.0)
MCH: 30.5 pg (ref 26.0–34.0)
MCHC: 31.8 g/dL (ref 30.0–36.0)
MCV: 96.1 fL (ref 80.0–100.0)
Monocytes Absolute: 1.2 K/uL — ABNORMAL HIGH (ref 0.1–1.0)
Monocytes Relative: 8 %
Neutro Abs: 10.8 K/uL — ABNORMAL HIGH (ref 1.7–7.7)
Neutrophils Relative %: 66 %
Platelets: 515 K/uL — ABNORMAL HIGH (ref 150–400)
RBC: 3.08 MIL/uL — ABNORMAL LOW (ref 4.22–5.81)
RDW: 14.6 % (ref 11.5–15.5)
Smear Review: NORMAL
WBC: 15.9 K/uL — ABNORMAL HIGH (ref 4.0–10.5)
nRBC: 0.8 % — ABNORMAL HIGH (ref 0.0–0.2)

## 2024-02-17 LAB — COMPREHENSIVE METABOLIC PANEL WITH GFR
ALT: 55 U/L — ABNORMAL HIGH (ref 0–44)
AST: 50 U/L — ABNORMAL HIGH (ref 15–41)
Albumin: 2.7 g/dL — ABNORMAL LOW (ref 3.5–5.0)
Alkaline Phosphatase: 93 U/L (ref 38–126)
Anion gap: 13 (ref 5–15)
BUN: 48 mg/dL — ABNORMAL HIGH (ref 8–23)
CO2: 23 mmol/L (ref 22–32)
Calcium: 8.9 mg/dL (ref 8.9–10.3)
Chloride: 102 mmol/L (ref 98–111)
Creatinine, Ser: 1.27 mg/dL — ABNORMAL HIGH (ref 0.61–1.24)
GFR, Estimated: >60 mL/min (ref >60)
Glucose, Bld: 114 mg/dL — ABNORMAL HIGH (ref 70–99)
Potassium: 4.3 mmol/L (ref 3.5–5.1)
Sodium: 138 mmol/L (ref 135–145)
Total Bilirubin: 0.6 mg/dL (ref 0.0–1.2)
Total Protein: 7.4 g/dL (ref 6.5–8.1)

## 2024-02-17 LAB — GLUCOSE, CAPILLARY
Glucose-Capillary: 136 mg/dL — ABNORMAL HIGH (ref 70–99)
Glucose-Capillary: 128 mg/dL — ABNORMAL HIGH (ref 70–99)
Glucose-Capillary: 122 mg/dL — ABNORMAL HIGH (ref 70–99)
Glucose-Capillary: 100 mg/dL — ABNORMAL HIGH (ref 70–99)
Glucose-Capillary: 100 mg/dL — ABNORMAL HIGH (ref 70–99)

## 2024-02-17 LAB — URINALYSIS, COMPLETE (UACMP) WITH MICROSCOPIC
Bacteria, UA: NONE SEEN
Bilirubin Urine: NEGATIVE
Glucose, UA: NEGATIVE mg/dL
Hgb urine dipstick: NEGATIVE
Ketones, ur: NEGATIVE mg/dL
Leukocytes,Ua: NEGATIVE
Nitrite: NEGATIVE
Protein, ur: NEGATIVE mg/dL
Specific Gravity, Urine: 1.02 (ref 1.005–1.030)
Squamous Epithelial / HPF: 0 /HPF (ref 0–5)
pH: 5 (ref 5.0–8.0)

## 2024-02-17 LAB — BLOOD GAS, VENOUS
Acid-Base Excess: 2.1 mmol/L — ABNORMAL HIGH (ref 0.0–2.0)
Bicarbonate: 25.5 mmol/L (ref 20.0–28.0)
O2 Saturation: 97.8 %
Patient temperature: 37
pCO2, Ven: 35 mmHg — ABNORMAL LOW (ref 44–60)
pH, Ven: 7.47 — ABNORMAL HIGH (ref 7.25–7.43)
pO2, Ven: 72 mmHg — ABNORMAL HIGH (ref 32–45)

## 2024-02-17 LAB — OSMOLALITY: Osmolality: 298 mosm/kg — ABNORMAL HIGH (ref 275–295)

## 2024-02-17 LAB — PATHOLOGIST SMEAR REVIEW

## 2024-02-17 MED ORDER — CYANOCOBALAMIN 1000 MCG/ML IJ SOLN: 1000.0000 ug | INTRAMUSCULAR | Status: DC

## 2024-02-17 MED ORDER — SODIUM CHLORIDE 0.9 % IV SOLN: INTRAVENOUS | Status: AC

## 2024-02-17 MED ORDER — LACTULOSE 10 GM/15ML PO SOLN
20.0000 g | Freq: Two times a day (BID) | ORAL | Status: DC
  Administered 2024-02-17 – 2024-02-22 (×11): 20 g via ORAL
  Filled 2024-02-17 (×11): qty 30

## 2024-02-17 NOTE — Progress Notes (Addendum)
 PROGRESS NOTE    Edward Weber  FMW:[id number] DOB: 1960-05-22 DOA: 02/04/2024 PCP: [name], MD     Brief Narrative:   From admission h and p  Patient is a 63 years old male with past medical history of hypertension, stroke, history of sickle cell anemia, GERD polysubstance abuse presented to the hospital with complaints of dizziness and passing out spell while he was trying to walk to his bathroom.  He also had some shortness of breath and exertional chest pain and dyspnea on exertion for past few days.  Family was able to catch him and lowered him to the floor but patient complains of pain on the back.  There was report that recently his medications including gabapentin  and amlodipine were removed.  Patient states that he has been smoking cigarettes and he uses cocaine.  Denies any nausea vomiting or abdominal pain.  Denies any urinary urgency frequency or dysuria.  Denies any dizziness lightheadedness currently.  Denies any sick contacts or recent travel.   In the ED, patient had elevated blood pressure at 203/102.  Afebrile with a temperature of 98.1 F.  Labs were notable for normal WBC with hemoglobin of 11.5.  BMP with creatinine of 1.0.  BNP within normal range at 81.  COVID influenza and RSV was negative.  Chest x-ray showed no acute infiltrate.  CT angiogram of the chest showed no evidence of pulmonary embolism.  CT head scan was negative for acute findings.  Patient was then considered for admission to hospital for syncope.  Assessment & Plan:   Principal Problem:   Alcohol withdrawal with delirium (HCC) Active Problems:   Syncope   Chest pain   Alcohol abuse   Essential hypertension   GERD without esophagitis   History of CVA (cerebrovascular accident)   T2DM (type 2 diabetes mellitus) (HCC)   Chest pain with moderate risk for cardiac etiology  # Alcohol use disorder with complicated withdrawal Developed agitation and delirium day of admission. Per daughter  heavy drinker with history of withdrawal, starts drinking first thing in the morning. Now no longer withdrawing - continue vitamins  # B12 deficiency Less than 150. Received a week of 1000 mcg IM daily - weekly b12  # Encephalopathy Persistent. Etiology unclear. Serial MRIs have shown sequela of subacute infarcts but thought to be too small to explain presentation. EEG no seizure. Hiv neg. Tsh wnl - stat head ct as will lift right arm but can't/won't lift left - continue high dose thiamine  - b12 as above - cbc, CMP, serum osm, vbg, rpr - will start lactulose given evidence cirrhosis on imaging  # Acute respiratory failure Rapid response 10/26, intubated for airway protection, now extubated and on room air - monitor  # RUE DVT Presumed 2/2 PIV now removed - dr schnier advises 3 months anticoagulation - currently on treatment dose lovenox , will continue for now as we are still working up encephalopathy  # Subacute CVA Front parietal region, no sig findings on TTE, dopplers. Neuro has signed off - asa if off anticoagulation - statin when safe for it  # Strep pneumo pneumonia S/p treatment with ceftriaxone  # Syncope Suspect related to substance abuse. CT head nothing acute. EKG nothing acute  # Chest pain Negative w/u with serial troponins and echocardiogram and EKG. Resolved - monitor  # Substance abuse Alcohol, per h and p patient admitted to cocaine abuse as well. Uds positive for opioids and thc  # sickle cell anemia ruled out  Carries this diagnosis but electrophoresis here normal  # T2DM Appears to be a new diagnosis, A1c in the 8s. Glucose here mild elevation - monitor  # HTN Labile BPs - home amlodipine   # Aortic dilation Incidental on CTA, 4 cm - outpt surveillance  # Alcohol liver disease Cirrhotic liver on CT but no laboratory evidence of such. Hcv rna neg (prior resolved infection) - start lactulose as above  # Debility - for snf    DVT  prophylaxis: lovenox  Code Status: full Family Communication: daughter shereda called 11/6  Level of care: Progressive Status is: Inpatient     Consultants:  Psychiatry, neurology, pccm  Procedures: S/p intubation  Antimicrobials:  See above    Subjective: No complaints, sleeps but rouses somewhat  Objective: Vitals:   02/16/24 2328 02/17/24 0424 02/17/24 0436 02/17/24 0726  BP: 123/76 (!) 152/77  (!) 151/92  Pulse: 86 87  89  Resp: 19 19  16   Temp: 98.2 F (36.8 C) 99.3 F (37.4 C)  99.4 F (37.4 C)  TempSrc: Oral Oral    SpO2: 93% 92%  95%  Weight:   82.3 kg   Height:        Intake/Output Summary (Last 24 hours) at 02/17/2024 1053 Last data filed at 02/17/2024 0936 Gross per 24 hour  Intake 720 ml  Output 1600 ml  Net -880 ml   Filed Weights   02/15/24 0500 02/16/24 0439 02/17/24 0436  Weight: 82.9 kg 83.8 kg 82.3 kg    Examination:  General exam: calm, chronically ill, sleeping Respiratory system: scattered rhonchi otherwise clear Cardiovascular system: S1 & S2 heard, RRR.  Gastrointestinal system: Abdomen is obese, soft and nontender.   Central nervous system: arouses somewhat, will lift right arm but not left Extremities: warm Skin: No rashes, lesions or ulcers. Depigmented skin Psychiatry: calm, obtunded    Data Reviewed: I have personally reviewed following labs and imaging studies  CBC: Recent Labs  Lab 02/12/24 0535 02/13/24 0340 02/14/24 0519 02/15/24 0600 02/16/24 0441  WBC 13.0* 11.1* 13.4* 12.3* 14.8*  HGB 9.8* 8.8* 9.7* 10.4* 9.6*  HCT 30.7* 27.5* 30.5* 33.5* 30.1*  MCV 95.9 95.2 95.6 98.0 95.3  PLT 332 331 420* 432* 469*   Basic Metabolic Panel: Recent Labs  Lab 02/11/24 0614 02/12/24 0535 02/13/24 0340 02/14/24 0519 02/15/24 0600 02/16/24 0441  NA 143 140 144 144 147* 143  K 4.3 4.3 4.6 4.4 4.8 4.6  CL 109 107 109 106 110 109  CO2 23 23 24 24 23 23   GLUCOSE 230* 233* 255* 143* 127* 147*  BUN 35* 36* 39* 39* 42*  48*  CREATININE 1.01 1.12 1.11 1.09 1.22 1.35*  CALCIUM  8.5* 8.7* 8.9 9.2 9.0 8.9  MG 2.3 2.3 2.2 2.3 2.4 2.3  PHOS 2.9 3.6 3.9  --   --   --    GFR: Estimated Creatinine Clearance: 57.1 mL/min (A) (by C-G formula based on SCr of 1.35 mg/dL (H)). Liver Function Tests: Recent Labs  Lab 02/13/24 0340  ALBUMIN 2.5*   No results for input(s): LIPASE, AMYLASE in the last 168 hours. No results for input(s): AMMONIA in the last 168 hours.  Coagulation Profile: No results for input(s): INR, PROTIME in the last 168 hours. Cardiac Enzymes: No results for input(s): CKTOTAL, CKMB, CKMBINDEX, TROPONINI in the last 168 hours. BNP (last 3 results) No results for input(s): PROBNP in the last 8760 hours. HbA1C: No results for input(s): HGBA1C in the last 72 hours.  CBG:  Recent Labs  Lab 02/16/24 1623 02/16/24 2004 02/16/24 2318 02/17/24 0421 02/17/24 0729  GLUCAP 125* 149* 125* 136* 128*   Lipid Profile: No results for input(s): CHOL, HDL, LDLCALC, TRIG, CHOLHDL, LDLDIRECT in the last 72 hours. Thyroid Function Tests: No results for input(s): TSH, T4TOTAL, FREET4, T3FREE, THYROIDAB in the last 72 hours.  Anemia Panel: No results for input(s): VITAMINB12, FOLATE, FERRITIN, TIBC, IRON, RETICCTPCT in the last 72 hours. Urine analysis:    Component Value Date/Time   COLORURINE STRAW (A) 02/04/2024 1516   APPEARANCEUR CLEAR (A) 02/04/2024 1516   APPEARANCEUR Clear 01/17/2020 1550   LABSPEC 1.021 02/04/2024 1516   LABSPEC 1.010 04/01/2014 1721   PHURINE 7.0 02/04/2024 1516   GLUCOSEU NEGATIVE 02/04/2024 1516   GLUCOSEU Negative 04/01/2014 1721   HGBUR NEGATIVE 02/04/2024 1516   BILIRUBINUR NEGATIVE 02/04/2024 1516   BILIRUBINUR Negative 01/17/2020 1550   BILIRUBINUR Negative 04/01/2014 1721   KETONESUR NEGATIVE 02/04/2024 1516   PROTEINUR 100 (A) 02/04/2024 1516   NITRITE NEGATIVE 02/04/2024 1516   LEUKOCYTESUR NEGATIVE  02/04/2024 1516   LEUKOCYTESUR Negative 04/01/2014 1721   Sepsis Labs: @LABRCNTIP (procalcitonin:4,lacticidven:4)  ) No results found for this or any previous visit (from the past 240 hours).        Radiology Studies: No results found.       Scheduled Meds:  amLODipine  10 mg Oral Daily   Chlorhexidine Gluconate Cloth  6 each Topical Daily   vitamin B-12  1,000 mcg Oral Daily   enoxaparin  (LOVENOX ) injection  1 mg/kg Subcutaneous Q12H   feeding supplement (KATE FARMS STANDARD ENT 1.4)  325 mL Oral TID BM   folic acid   1 mg Oral Daily   insulin aspart  0-15 Units Subcutaneous Q4H   insulin glargine-yfgn  10 Units Subcutaneous Daily   multivitamin with minerals  1 tablet Oral Daily   nicotine  21 mg Transdermal Daily   pantoprazole   40 mg Oral Daily   Continuous Infusions:  thiamine  (VITAMIN B1) injection 500 mg (02/17/24 0936)      LOS: 13 days     Devaughn KATHEE Ban, MD [facility]   If 7PM-7AM, please contact night-coverage [email or web address] Password [facility] 02/17/2024, 10:53 AM

## 2024-02-17 NOTE — Plan of Care (Signed)

## 2024-02-17 NOTE — Progress Notes (Signed)
 Physical Therapy Treatment Patient Details Name: Artavis Cowie MRN: [id number] DOB: 11/08/1960 Today's Date: 02/17/2024   History of Present Illness 63 y.o male with significant PMH of HTN, CVA, sickle cell anemia, GERD, polysubstance abuse (EtOH abuse disorder, cocaine, marijuana tobacco abuse), and recurrent syncope who presented to the ED with chief complaints of syncopal episode. Per chart review, he has been having exertional chest pain, DOP for the past few days and today felt dizzy and passed out without hitting his head. Pt transferred to ICU on 10/26. MRI imaging from 11/01 revealing possible subacute ischemia in left centrum semiovale. Pt recently extubated on 10/30. [name]    PT Comments  Patient is agreeable to PT session. He is awake and able to state his name. Minimal extremity movement on the right side to command, no active movement noted in the left side. He continues to require significant assistance with bed mobility. Poor sitting balance and decreased overall activity tolerance. Unable to attempt transfers at this time due to weakness. Recommend to continue PT to maximize independence and decrease caregiver burden. Rehabilitation < 3 hours/day recommended after this hospital stay.    If plan is discharge home, recommend the following: Two people to help with walking and/or transfers;Two people to help with bathing/dressing/bathroom;Direct supervision/assist for medications management;Direct supervision/assist for financial management;Assist for transportation;Help with stairs or ramp for entrance;Supervision due to cognitive status   Can travel by private vehicle     No  Equipment Recommendations   (TBD at next level of care)    Recommendations for Other Services       Precautions / Restrictions Precautions Precautions: Fall Recall of Precautions/Restrictions: Impaired Restrictions Weight Bearing Restrictions Per Provider Order: No     Mobility  Bed Mobility Overal  bed mobility: Needs Assistance Bed Mobility: Supine to Sit, Sit to Supine     Supine to sit: Total assist Sit to supine: Total assist   General bed mobility comments: cues for initiation, sequencing, and participation with functional activity. minimal active participation with bed mobility efforts    Transfers                   General transfer comment: unable to attempt due to poor sitting tolerance, weakness    Ambulation/Gait                   Stairs             Wheelchair Mobility     Tilt Bed    Modified Rankin (Stroke Patients Only)       Balance Overall balance assessment: Needs assistance Sitting-balance support: Feet unsupported, No upper extremity supported Sitting balance-Leahy Scale: Poor Sitting balance - Comments: decreased protective righting reactions with loss of balance in all directions when challenged. right and anterior lean. brief periods of Min A, otherwise Max A required                                    Communication Communication Communication: Impaired Factors Affecting Communication: Reduced clarity of speech  Cognition Arousal: Alert Behavior During Therapy: Flat affect   PT - Cognitive impairments: Difficult to assess, Initiation, Sequencing Difficult to assess due to: Impaired communication                     PT - Cognition Comments: oriented to self. unable to initially state we are in the hospital. reports he  lives alone in [place]. he is awake throghout session. delay with command following Following commands: Impaired Following commands impaired: Follows one step commands inconsistently, Follows one step commands with increased time    Cueing Cueing Techniques: Verbal cues, Gestural cues, Visual cues  Exercises      General Comments General comments (skin integrity, edema, etc.): faciliation proivided with ROM to BLE with minimal movement noted in RLE, no active movement noted  in LLE. trace right arm movement noted with no active movement in LUE      Pertinent Vitals/Pain Pain Assessment Pain Assessment: No/denies pain    Home Living                          Prior Function            PT Goals (current goals can now be found in the care plan section) Acute Rehab PT Goals Patient Stated Goal: none stated PT Goal Formulation: With patient Time For Goal Achievement: 02/28/24 Potential to Achieve Goals: Fair Progress towards PT goals: Progressing toward goals    Frequency    Min 2X/week      PT Plan      Co-evaluation              AM-PAC PT 6 Clicks Mobility   Outcome Measure  Help needed turning from your back to your side while in a flat bed without using bedrails?: A Lot Help needed moving from lying on your back to sitting on the side of a flat bed without using bedrails?: Total Help needed moving to and from a bed to a chair (including a wheelchair)?: Total Help needed standing up from a chair using your arms (e.g., wheelchair or bedside chair)?: Total Help needed to walk in hospital room?: Total Help needed climbing 3-5 steps with a railing? : Total 6 Click Score: 7    End of Session   Activity Tolerance: Patient limited by fatigue Patient left: in bed;with call bell/phone within reach;with bed alarm set Nurse Communication: Mobility status PT Visit Diagnosis: Muscle weakness (generalized) (M62.81);Unsteadiness on feet (R26.81);Other abnormalities of gait and mobility (R26.89);Difficulty in walking, not elsewhere classified (R26.2)     Time: 8697-8679 PT Time Calculation (min) (ACUTE ONLY): 18 min  Charges:    $Therapeutic Activity: 8-22 mins PT General Charges $$ ACUTE PT VISIT: 1 Visit                     Randine Essex, PT, MPT    Randine LULLA Essex 02/17/2024, 1:26 PM
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
## 2024-02-17 NOTE — Plan of Care (Signed)
   Problem: Clinical Measurements: Goal: Ability to maintain clinical measurements within normal limits will improve Outcome: Progressing

## 2024-02-17 NOTE — Progress Notes (Signed)
 Nutrition Follow-up  DOCUMENTATION CODES:   Not applicable  INTERVENTION:   -Continue dysphagia 1 diet with thin liquids; RD will follow for diet advancement and adjust supplement regimen as appropriate -Continue MVI with minerals daily -Continue high dose thiamine  -Continue 10000 mcg cyancobalamin daily -Continue B-complex with C x 30 days -Continue daily weights -Feeding assistance with meals  NUTRITION DIAGNOSIS:   Inadequate oral intake related to inability to eat (pt sedated and ventilated) as evidenced by NPO status.  Progressing; advanced to PO diet on 02/15/24  GOAL:   Patient will meet greater than or equal to 90% of their needs  Progressing   MONITOR:   PO intake, Supplement acceptance, Labs, Weight trends, I & O's, Skin  REASON FOR ASSESSMENT:   Ventilator    ASSESSMENT:   63 y/o male with h/o etoh and substance abuse, milk allergy, HTN, GERD, Schatzki's ring, hiatal hernia, CVA with residual hemiplegia, DM, neuropathy, prostate cancer, HCV, sickle cell anemia, ICH, depression, CKD III, gout, H. pylori and spinal stenosis who is admitted with AMS, syncope, aspiration PNA, sepsis, new sub-acute infarcts and AKI.  Reviewed I/O's: -1.5 L x 24 hours and -1.3 L since admission  UOP: 1.6 L x 24 hours   Mr Uhde was sitting up in bed at time of visit. Patient able to make eye contact and acknowledge RD presence, but with a lot of confusion. Attempted to orient patient ot time, place, and situation. Patient requesting water, which nurse tech assisted with. Observed breakfast tray; patient consumed some puree fruit, juice, and water. Noted [name] supplement at bedside unopened. No family present to provide additional history.   Case discussed with SLP, who plans to re-evaluate today for hopeful diet advancement. Suspect oral intake will increase with diet advancement.   Weight has been stable over the past week.   Per TOC notes, plan for SNF placement at  discharge.   Medications reviewed and include vitamin B-12, folic acid , MVI, protonix , and thiamine .   Labs reviewed: CBGS: 86-149 (inpatient orders for glycemic control are 0-15 units insulin aspart every 4 hours and 10 units insulin glargine-yfgn daily).    Diet Order:   Diet Order             DIET - DYS 1 Room service appropriate? No; Fluid consistency: Thin  Diet effective now                   EDUCATION NEEDS:   No education needs have been identified at this time  Skin:  Skin Assessment: Reviewed RN Assessment  Last BM:  02/15/24  Height:   Ht Readings from Last 1 Encounters:  02/04/24 5' 8 (1.727 m)    Weight:   Wt Readings from Last 1 Encounters:  02/17/24 82.3 kg    Ideal Body Weight:  70 kg  BMI:  Body mass index is 27.59 kg/m.  Estimated Nutritional Needs:   Kcal:  2000-2300kcal/day  Protein:  100-115g/day  Fluid:  1.8-2.1L/day    Margery ORN, RD, LDN, CDCES Registered Dietitian III Certified Diabetes Care and Education Specialist If unable to reach this RD, please use RD Inpatient group chat on secure chat between hours of 8am-4 pm daily

## 2024-02-17 NOTE — TOC PASRR Note (Signed)
 RE: Edward Weber Date of Birth: 10-06-1960 Date: 02/17/2024   To Whom It May Concern:  Please be advised that the above-named patient will require a short-term nursing home stay - anticipated 30 days or less for rehabilitation and strengthening.  The plan is for return home.

## 2024-02-17 NOTE — Progress Notes (Signed)
 NEUROLOGY CONSULT FOLLOW UP NOTE   Date of service: February 17, 2024 Patient Name: Edward Weber MRN:  [id number] DOB:  [birth year]  Interval Hx/subjective   - Neurology previously consulted for subacute stroke and signed off - Neurology reconsulted for persistent encephalopathy of unclear etiology since admission - Hospitalization complicated by EtOH withdrawal involving agitation and delirium - He is on Wernicke dosing thiamine  - He is on IM B12 supplementation for B12 <150 - He has been started on empiric lactulose given cirrhosis on imaging - MRI brain wwo contrast 02/12/24 showed small subacute infarct (insufficient to explain his encephalopathy), no other acute process  Vitals   Vitals:   02/17/24 0424 02/17/24 0436 02/17/24 0726 02/17/24 1148  BP: (!) 152/77  (!) 151/92 (!) 145/86  Pulse: 87  89 88  Resp: 19  16 17   Temp: 99.3 F (37.4 C)  99.4 F (37.4 C) 99 F (37.2 C)  TempSrc: Oral     SpO2: 92%  95% 95%  Weight:  82.3 kg    Height:         Body mass index is 27.59 kg/m.  Physical Exam   Gen: patient lying in bed, NAD CV: extremities appear well-perfused Resp: normal WOB  Neurologic exam MS: lethargic, oriented to self and hospital, niece but not sister at bedside (said she is a chemical engineer), follows some simple commands Speech: moderate dysarthria, moderate aphasia CN: PERRL, blinks to threat bilat, EOMI, sensation intact, face symmetric at rest, hearing intact to voice Motor: RUE drift but not to bed, no movement against gravity LUE or BLE Sensory: Sensory deficit RUE Reflexes: 1+ symm with toes down bilat Coordination: UTA 2/2 AMS Gait: deferred   Medications  Current Facility-Administered Medications:    acetaminophen  (TYLENOL ) tablet 650 mg, 650 mg, Oral, Q6H PRN, 650 mg at 02/15/24 2128 **OR** acetaminophen  (TYLENOL ) suppository 650 mg, 650 mg, Rectal, Q6H PRN, Pokhrel, Laxman, MD   amLODipine (NORVASC) tablet 10 mg, 10 mg, Oral, Daily,  Chappell, Alex B, RPH, 10 mg at 02/17/24 9182   Chlorhexidine Gluconate Cloth 2 % PADS 6 each, 6 each, Topical, Daily, Assaker, Jean-Pierre, MD, 6 each at 02/16/24 2100   [START ON 02/21/2024] cyanocobalamin (VITAMIN B12) injection 1,000 mcg, 1,000 mcg, Intramuscular, Q7 days, Wouk, Devaughn Sayres, MD   enoxaparin  (LOVENOX ) injection 85 mg, 1 mg/kg, Subcutaneous, Q12H, Assaker, Jean-Pierre, MD, 85 mg at 02/17/24 1305   feeding supplement (KATE FARMS STANDARD ENT 1.4) liquid 325 mL, 325 mL, Oral, TID BM, Trudy, Jamiese M, MD, 325 mL at 02/17/24 1307   folic acid  (FOLVITE ) tablet 1 mg, 1 mg, Oral, Daily, Clair Marolyn NOVAK, RPH, 1 mg at 02/17/24 0817   insulin aspart (novoLOG) injection 0-15 Units, 0-15 Units, Subcutaneous, Q4H, Keene, Jeremiah D, NP, 2 Units at 02/17/24 1305   insulin glargine-yfgn (SEMGLEE) injection 10 Units, 10 Units, Subcutaneous, Daily, Assaker, Jean-Pierre, MD, 10 Units at 02/17/24 0819   ipratropium-albuterol  (DUONEB) 0.5-2.5 (3) MG/3ML nebulizer solution 3 mL, 3 mL, Nebulization, Q6H PRN, Ouma, Almarie Bake, NP   labetalol  (NORMODYNE ) injection 20 mg, 20 mg, Intravenous, Q2H PRN, Wouk, Devaughn Sayres, MD, 20 mg at 02/14/24 0911   lactulose (CHRONULAC) 10 GM/15ML solution 20 g, 20 g, Oral, BID, Wouk, Devaughn Sayres, MD, 20 g at 02/17/24 1305   multivitamin with minerals tablet 1 tablet, 1 tablet, Oral, Daily, Pokhrel, Laxman, MD, 1 tablet at 02/17/24 0817   nicotine (NICODERM CQ - dosed in mg/24 hours) patch 21 mg, 21 mg, Transdermal, Daily,  Pokhrel, Laxman, MD, 21 mg at 02/17/24 0818   Oral care mouth rinse, 15 mL, Mouth Rinse, PRN, Assaker, Jean-Pierre, MD, 15 mL at 02/13/24 2000   Oral care mouth rinse, 15 mL, Mouth Rinse, PRN, Mansy, Jan A, MD   pantoprazole  (PROTONIX ) EC tablet 40 mg, 40 mg, Oral, Daily, Nazari, Walid A, RPH, 40 mg at 02/16/24 1114   polyethylene glycol (MIRALAX / GLYCOLAX) packet 17 g, 17 g, Oral, Daily PRN, Pokhrel, Laxman, MD   thiamine  (VITAMIN B1)  500 mg in sodium chloride  0.9 % 50 mL IVPB, 500 mg, Intravenous, TID, Kandis Devaughn Sayres, MD, Last Rate: 110 mL/hr at 02/17/24 0936, 500 mg at 02/17/24 0936  Labs and Diagnostic Imaging   CBC:  Recent Labs  Lab 02/16/24 0441 02/17/24 1257  WBC 14.8* 15.9*  NEUTROABS  --  10.8*  HGB 9.6* 9.4*  HCT 30.1* 29.6*  MCV 95.3 96.1  PLT 469* 515*    Basic Metabolic Panel:  Lab Results  Component Value Date   NA 138 02/17/2024   K 4.3 02/17/2024   CO2 23 02/17/2024   GLUCOSE 114 (H) 02/17/2024   BUN 48 (H) 02/17/2024   CREATININE 1.27 (H) 02/17/2024   CALCIUM  8.9 02/17/2024   GFRNONAA >60 02/17/2024   GFRAA 84 02/13/2020   Lipid Panel:  Lab Results  Component Value Date   LDLCALC 87 05/18/2012   HgbA1c:  Lab Results  Component Value Date   HGBA1C 8.1 (H) 02/04/2024   Urine Drug Screen:     Component Value Date/Time   LABOPIA POSITIVE (A) 02/04/2024 1415   COCAINSCRNUR NONE DETECTED 02/04/2024 1415   LABBENZ NONE DETECTED 02/04/2024 1415   AMPHETMU NONE DETECTED 02/04/2024 1415   THCU POSITIVE (A) 02/04/2024 1415   LABBARB NONE DETECTED 02/04/2024 1415    Alcohol Level     Component Value Date/Time   ETH <10 09/18/2022 0051   INR  Lab Results  Component Value Date   INR 1.3 (H) 09/18/2022   APTT  Lab Results  Component Value Date   APTT 79 (H) 02/10/2024   AED levels: No results found for: PHENYTOIN, ZONISAMIDE, LAMOTRIGINE, LEVETIRACETA  MRI Brain with and without contrast 11/1 (Personally reviewed): 1. Linear focus of diffusion-weighted hyperintensity in the left centrum semiovale without corresponding ADC reduction, possibly late subacute ischemia. 2. Severe bilateral foraminal stenosis at C4-5 without spinal canal stenosis, unchanged . 3. Severe bilateral foraminal stenosis at C5-6 with mild spinal canal stenosis, unchanged . 4. Severe bilateral foraminal stenosis at C6-7 with mild central spinal canal stenosis, unchanged . 5. C4 corpectomy  with interbody cage and anterior fusion at C3-5.  MRI c spine wwo contrast 11/1 1. Linear focus of diffusion-weighted hyperintensity in the left centrum semiovale without corresponding ADC reduction, possibly late subacute ischemia. 2. Severe bilateral foraminal stenosis at C4-5 without spinal canal stenosis, unchanged . 3. Severe bilateral foraminal stenosis at C5-6 with mild spinal canal stenosis, unchanged . 4. Severe bilateral foraminal stenosis at C6-7 with mild central spinal canal stenosis, unchanged . 5. C4 corpectomy with interbody cage and anterior fusion at C3-5.  rEEG 10/30 Moderate diffuse slowing  UDS (+) for THC and opiates (vicodin is on home med list) RPR NR Cr 1.27, BUN 48, albumin 2.7, AST 50, ALT 55 CBC 14.8 (mildly elevated between 11-14 since admission) Ammonia 10/20 <13 TSH 0.844 Vit B12 <150  Assessment   Khyan Antone Summons is a 63 y.o. male with hx EtOH abuse admitted for acute ischemic stroke whose  hospitalization has been complicated by EtOH withdrawal and persistent encephalopathy. He has no clear ophthalmoplegia and is unable to walk without 2 person assist therefore gait ataxia is unable to be assessed. Suspect delirium and severe B12 deficiency superimposed on possible Wernicke's. Recommend further workup as follows:  Recommendations   # Encephalopathy of unclear etiology - Possible Wernicke's, MRI is specific but not sensitive - Continue Wernicke dose thiamine  and appropriate taper - Recheck ammonia - Recheck EEG - B12 deficiency - s/p one week of IM 1000mcg B12 daily, continue 1000mcg IM every other day for one week f/b once weekly after that - Agree with starting lactulose given cirrhosis on imaging  # Inability to walk, still requires 2 person assist - Consider MRI t and l spine wwo  #Subacute ischemic stroke in frontoparietal region on MRI, no sig findings on TTE or carotid dopplers. Etiology of stroke favored to be small vessel disease vs  vasospasm in setting of possible recent cocaine use - Patient currently on anticoagulation with lovenox , should be started on ASA 81mg  daily if anticoagulation is stopped - High-intensity statin - Ambulatory referral to outpatient neuro at hospital discharge ______________________________________________________________________   Signed, Elida CHRISTELLA Ross, MD [facility]

## 2024-02-17 NOTE — TOC Progression Note (Addendum)
 Transition of Care [facility]) - Progression Note    Patient Details  Name: Edward Weber MRN: [id number] Date of Birth: 12-05-1960  Transition of Care [facility]) CM/SW Contact  Lauraine JAYSON Carpen, LCSW Phone Number: 02/17/2024, 11:57 AM  Clinical Narrative:   CSW uploaded clinicals into [facility] for [facility] review.  2:54 pm: [facility] requested that date patient's IVC was rescinded. Patient's only bed offer is [facility]. Daughter is aware. CSW offered to extend search to see what other options are available and she is agreeable.   3:02 pm: Uploaded updated FL2 with date that IVC was rescinded into [facility].  Expected Discharge Plan and Services                                               Social Drivers of Health (SDOH) Interventions SDOH Screenings   Food Insecurity: Patient Unable To Answer (02/05/2024)  Housing: Patient Unable To Answer (02/05/2024)  Transportation Needs: Patient Unable To Answer (02/05/2024)  Utilities: Patient Unable To Answer (02/05/2024)  Financial Resource Strain: Medium Risk (05/04/2023)   Received from [facility]  Social Connections: Patient Unable To Answer (02/05/2024)  Tobacco Use: High Risk (02/04/2024)    Readmission Risk Interventions     No data to display

## 2024-02-17 NOTE — Progress Notes (Signed)
 Speech Language Pathology Treatment: Dysphagia  Patient Details Name: Edward Weber MRN: [id number] DOB: [birth year] Today's Date: 02/17/2024 Time: 8769-8744 SLP Time Calculation (min) (ACUTE ONLY): 25 min  Assessment / Plan / Recommendation Clinical Impression  Pt seen for dysphagia intervention, targeting trials of advanced solids and assessment of safety with current recommendations. Pt on room air- with WBC elevated to 14.8. No recent chest imaging. Head CT today negative for acute abnormality. Pt intermittently nodding in response to prompts- minimal verbalizations noted. Also, limited endurance/attention to task eating, requiring verbal cues for redirection and sufficient alertness. Trial attempted of chopped solids- with no response from pt to accept/masticate solid. Puree trials completed with efficient oral manipulation and clearance. Delayed cough noted x1- following large straw sip from straw.   Recommend continued aspiration precautions as stated below. Pinch straw to prevent large liquid sips. Close supervision and assist for PO intake to ensure alertness and awareness for task of eating. Current mentation is barrier for safe advancement in diet- recommend continued puree and thin liquids. SLP will continue to monitor for safety/readiness for advancement. MD and RN aware of recommendations.    HPI HPI: Per critical care progress note, 63 y.o male with significant PMH/Current of HTN, CVA, sickle cell anemia, GERD, polysubstance abuse (EtOH abuse disorder, cocaine, marijuana tobacco abuse), Neuromuscular disorder, and recurrent syncope who presented to the ED with chief complaints of syncopal episode.  Per chart review, he has been having exertional chest pain, DOP for the past few days and today felt dizzy and passed out without hitting his head..  Pt was transferred to CCU and intubated for airway protection on 10/26; extubaed on 11/2 when calmer State.   MRI 11/1:  No acute infarct. No  acute intracranial hemorrhage. No mass  or abnormal enhancement. No midline shift. No hydrocephalus. Linear focus of diffusion-weighted hyperintensity in the left centrum semiovale without corresponding ADC reduction, possibly late subacute ischemia.  Previous Head CT revealed: advanced chronic small vessel disease.   CXR at admit: no acute findings.   OF NOTE: Psychiatry was consulted for agitation and threatening to leave AMA. Due to history of polysubstance abuse and AMS; patient was IVCd.    Head CT Today:   1. No acute intracranial abnormality. 2. Remote lacunar infarcts in the bilateral deep gray nuclei. 3. Chronic microvascular ischemic disease changes in the white matter.    SLP Plan  Continue with current plan of care          Recommendations  Diet recommendations: Dysphagia 1 (puree);Thin liquid Liquids provided via: Cup;Straw (pinched straw) Medication Administration: Crushed with puree Supervision: Staff to assist with self feeding;Full supervision/cueing for compensatory strategies Compensations: Minimize environmental distractions;Slow rate;Small sips/bites;Lingual sweep for clearance of pocketing;Follow solids with liquid Postural Changes and/or Swallow Maneuvers: Out of bed for meals;Seated upright 90 degrees;Upright 30-60 min after meal (reflux precautions)                  Oral care BID;Oral care before and after PO;Staff/trained caregiver to provide oral care   Frequent or constant Supervision/Assistance Dysphagia, unspecified (R13.10) (in setting of agitation requring intubation/extubation; unknown Cognitive functioning PTA; poor/missing Most Dentition; need for Full Feeding Assistance; GERD)     Continue with current plan of care    Zakariye Nee Clapp, MS, CCC-SLP Speech Language Pathologist Rehab Services; [facility] [phone] (ascom)   Ettie Krontz J Clapp  02/17/2024, 1:10 PM

## 2024-02-18 ENCOUNTER — Inpatient Hospital Stay

## 2024-02-18 ENCOUNTER — Telehealth (HOSPITAL_COMMUNITY): Payer: Self-pay

## 2024-02-18 ENCOUNTER — Other Ambulatory Visit (HOSPITAL_COMMUNITY): Payer: Self-pay

## 2024-02-18 DIAGNOSIS — F10288 Alcohol dependence with other alcohol-induced disorder: Secondary | ICD-10-CM

## 2024-02-18 DIAGNOSIS — E538 Deficiency of other specified B group vitamins: Secondary | ICD-10-CM

## 2024-02-18 DIAGNOSIS — E119 Type 2 diabetes mellitus without complications: Secondary | ICD-10-CM

## 2024-02-18 DIAGNOSIS — E512 Wernicke's encephalopathy: Secondary | ICD-10-CM

## 2024-02-18 DIAGNOSIS — F10931 Alcohol use, unspecified with withdrawal delirium: Secondary | ICD-10-CM | POA: Diagnosis not present

## 2024-02-18 DIAGNOSIS — I639 Cerebral infarction, unspecified: Secondary | ICD-10-CM

## 2024-02-18 DIAGNOSIS — R55 Syncope and collapse: Secondary | ICD-10-CM | POA: Diagnosis not present

## 2024-02-18 DIAGNOSIS — I10 Essential (primary) hypertension: Secondary | ICD-10-CM | POA: Diagnosis not present

## 2024-02-18 DIAGNOSIS — F10139 Alcohol abuse with withdrawal, unspecified: Secondary | ICD-10-CM

## 2024-02-18 DIAGNOSIS — F1990 Other psychoactive substance use, unspecified, uncomplicated: Secondary | ICD-10-CM

## 2024-02-18 DIAGNOSIS — R410 Disorientation, unspecified: Secondary | ICD-10-CM

## 2024-02-18 LAB — GLUCOSE, CAPILLARY
Glucose-Capillary: 107 mg/dL — ABNORMAL HIGH (ref 70–99)
Glucose-Capillary: 109 mg/dL — ABNORMAL HIGH (ref 70–99)
Glucose-Capillary: 111 mg/dL — ABNORMAL HIGH (ref 70–99)
Glucose-Capillary: 123 mg/dL — ABNORMAL HIGH (ref 70–99)
Glucose-Capillary: 134 mg/dL — ABNORMAL HIGH (ref 70–99)
Glucose-Capillary: 137 mg/dL — ABNORMAL HIGH (ref 70–99)
Glucose-Capillary: 151 mg/dL — ABNORMAL HIGH (ref 70–99)

## 2024-02-18 LAB — COMPREHENSIVE METABOLIC PANEL WITH GFR
ALT: 59 U/L — ABNORMAL HIGH (ref 0–44)
AST: 57 U/L — ABNORMAL HIGH (ref 15–41)
Albumin: 2.6 g/dL — ABNORMAL LOW (ref 3.5–5.0)
Alkaline Phosphatase: 101 U/L (ref 38–126)
Anion gap: 13 (ref 5–15)
BUN: 30 mg/dL — ABNORMAL HIGH (ref 8–23)
CO2: 19 mmol/L — ABNORMAL LOW (ref 22–32)
Calcium: 8.3 mg/dL — ABNORMAL LOW (ref 8.9–10.3)
Chloride: 103 mmol/L (ref 98–111)
Creatinine, Ser: 1.16 mg/dL (ref 0.61–1.24)
GFR, Estimated: >60 mL/min (ref >60)
Glucose, Bld: 116 mg/dL — ABNORMAL HIGH (ref 70–99)
Potassium: 4.2 mmol/L (ref 3.5–5.1)
Sodium: 135 mmol/L (ref 135–145)
Total Bilirubin: 0.8 mg/dL (ref 0.0–1.2)
Total Protein: 7.4 g/dL (ref 6.5–8.1)

## 2024-02-18 LAB — AMMONIA: Ammonia: 27 umol/L (ref 9–35)

## 2024-02-18 LAB — RPR: RPR Ser Ql: NONREACTIVE

## 2024-02-18 MED ORDER — CYANOCOBALAMIN 1000 MCG/ML IJ SOLN: 1000.0000 ug | INTRAMUSCULAR | Status: DC

## 2024-02-18 MED ORDER — THIAMINE HCL 100 MG/ML IJ SOLN: 100.0000 mg | INTRAMUSCULAR | Status: DC

## 2024-02-18 MED ORDER — THIAMINE HCL 100 MG/ML IJ SOLN
250.0000 mg | INTRAVENOUS | Status: DC
  Administered 2024-02-19 – 2024-02-20 (×2): 250 mg via INTRAVENOUS
  Filled 2024-02-18 (×2): qty 2.5

## 2024-02-18 MED ORDER — CYANOCOBALAMIN 1000 MCG/ML IJ SOLN
1000.0000 ug | INTRAMUSCULAR | Status: DC
  Administered 2024-02-21: 1000 ug via INTRAMUSCULAR
  Filled 2024-02-18: qty 1

## 2024-02-18 NOTE — Progress Notes (Addendum)
 Nutrition Follow-up  DOCUMENTATION CODES:   Not applicable  INTERVENTION:   -Continue dysphagia 1 diet with thin liquids; RD will follow for diet advancement and adjust supplement regimen as appropriate -Continue MVI with minerals daily -Continue high dose thiamine  -Continue 10000 mcg cyancobalamin daily -Continue B-complex with C x 30 days -Continue daily weights -Continue feeding assistance with meals -If NGT is placed, recommend:   Initiate [employer] @ 20 ml/hr and increase by 10 ml every 12 hours to goal rate of 70 ml/hr.   30 ml free water flush every 4 hours  Tube feeding regimen provides 2352 kcal (100% of needs), 104 grams of protein, and 1193 ml of H2O. Total free water: 1373 ml daily  -If feedings are started: monitor Mg, K, and Phos and replete as needed and 100 mg thiamine  daily x 7 days  NUTRITION DIAGNOSIS:   Inadequate oral intake related to inability to eat (pt sedated and ventilated) as evidenced by NPO status.  Progressing; advanced to PO diet on 02/15/24   GOAL:   Patient will meet greater than or equal to 90% of their needs  Progressing   MONITOR:   PO intake, Supplement acceptance, Labs, Weight trends, I & O's, Skin  REASON FOR ASSESSMENT:   Ventilator    ASSESSMENT:   63 y/o male with h/o etoh and substance abuse, milk allergy, HTN, GERD, Schatzki's ring, hiatal hernia, CVA with residual hemiplegia, DM, neuropathy, prostate cancer, HCV, sickle cell anemia, ICH, depression, CKD III, gout, H. pylori and spinal stenosis who is admitted with AMS, syncope, aspiration PNA, sepsis, new sub-acute infarcts and AKI.  11/6- s/p BSE- dysphagia 1 diet with thin liquids  Reviewed I/O's: +679 ml x 24 hours and -611 ml since admission  UOP: 1.5 L x 24 hours   Patoient sitting up in bed at time of visit. He is more alert and interactive in comparison to priro visit. He reports he is hungry and wanting breakfast. He shares he has not eaten yet; when offered  to assist patiewnt with meal order, he was unable to state what hje wanted other than something good. He shares intake has improved and is tolerating current diet texture well. Noted meal completions 15-75%.   RD provided patient with some water per his request. Patient refused [employer] supplement, stating I don't drink that. RD re-explained purpose and that that it does not contain milk (pt endorses milk allergy), however he still refused to drink.   Case discussed with MD and discussed concerns of poor oral intake. Plan to consider NGT placement.   Medications reviewed and include vitamin B-12, folic acid , lactulose, MVI, protonix , and sodium chloride  infusion @ 100 ml/hr.   Per TOC notes, IVC has been rescinded. Plan for SNF placement at discharge.   Labs reviewed: CBGS: 100-137 (inpatient orders for glycemic control are 0-15 units insulin aspart every 4 hours and 10 units insulin glargine-yfgn daily).    Diet Order:   Diet Order             DIET - DYS 1 Fluid consistency: Thin  Diet effective now                   EDUCATION NEEDS:   No education needs have been identified at this time  Skin:  Skin Assessment: Reviewed RN Assessment  Last BM:  02/18/24 (type 6)  Height:   Ht Readings from Last 1 Encounters:  02/04/24 5' 8 (1.727 m)    Weight:  Wt Readings from Last 1 Encounters:  02/18/24 80.5 kg    Ideal Body Weight:  70 kg  BMI:  Body mass index is 26.98 kg/m.  Estimated Nutritional Needs:   Kcal:  2000-2300kcal/day  Protein:  100-115g/day  Fluid:  1.8-2.1L/day    Margery ORN, RD, LDN, CDCES Registered Dietitian III Certified Diabetes Care and Education Specialist If unable to reach this RD, please use RD Inpatient group chat on secure chat between hours of 8am-4 pm daily

## 2024-02-18 NOTE — Progress Notes (Signed)
 Eeg done

## 2024-02-18 NOTE — Progress Notes (Signed)
 Occupational Therapy Treatment Patient Details Name: Edward Weber MRN: [id number] DOB: 1961/02/27 Today's Date: 02/18/2024   History of present illness 63 y.o male with significant PMH of HTN, CVA, sickle cell anemia, GERD, polysubstance abuse (EtOH abuse disorder, cocaine, marijuana tobacco abuse), and recurrent syncope who presented to the ED with chief complaints of syncopal episode. Per chart review, he has been having exertional chest pain, DOP for the past few days and today felt dizzy and passed out without hitting his head. Pt transferred to ICU on 10/26. MRI imaging from 11/01 revealing possible subacute ischemia in left centrum semiovale. Pt recently extubated on 10/30. .   OT comments  Upon entering the room, pt supine in bed and agreeable to OT intervention. Pt reports,  I am glad you are here to work with me. Pt groans when L LE touched during this session. Sister enters the room and reports pt may have history of gout but she is unsure. Pt needing total A for supine >sit . Pt sitting on EOB with max A for ~ 12 minutes. He asks for water needing total hand over hand assistance to bring to mouth. Heavy L lateral leaning once fatigued with total A to return to supine. Bed alarm activated and call bell within reach.       If plan is discharge home, recommend the following:  A lot of help with walking and/or transfers;A lot of help with bathing/dressing/bathroom;Assistance with cooking/housework;Help with stairs or ramp for entrance;Assist for transportation;Direct supervision/assist for financial management;Direct supervision/assist for medications management;Supervision due to cognitive status   Equipment Recommendations  Other (comment) (defer to next venue of care)       Precautions / Restrictions Precautions Precautions: Fall       Mobility Bed Mobility Overal bed mobility: Needs Assistance Bed Mobility: Supine to Sit, Sit to Supine     Supine to sit: Total  assist Sit to supine: Total assist        Transfers                         Balance Overall balance assessment: Needs assistance Sitting-balance support: Feet unsupported, No upper extremity supported Sitting balance-Leahy Scale: Poor Sitting balance - Comments: max A                                   ADL either performed or assessed with clinical judgement   ADL Overall ADL's : Needs assistance/impaired Eating/Feeding: Total assistance;Sitting Eating/Feeding Details (indicate cue type and reason): hand over hand assistance to bring cup to mouth                                         Communication Communication Communication: Impaired Factors Affecting Communication: Reduced clarity of speech   Cognition Arousal: Alert Behavior During Therapy: Flat affect Cognition: Cognition impaired     Awareness: Intellectual awareness impaired, Online awareness impaired     Executive functioning impairment (select all impairments): Initiation, Organization, Sequencing, Reasoning, Problem solving                   Following commands: Impaired Following commands impaired: Follows one step commands inconsistently, Follows one step commands with increased time      Cueing   Cueing Techniques: Verbal cues, Gestural cues, Visual cues  Pertinent Vitals/ Pain       Pain Assessment Pain Assessment: Faces Faces Pain Scale: Hurts whole lot Pain Location: L LE Pain Descriptors / Indicators: Grimacing, Discomfort, Moaning Pain Intervention(s): Limited activity within patient's tolerance, Monitored during session, Repositioned         Frequency  Min 2X/week        Progress Toward Goals  OT Goals(current goals can now be found in the care plan section)  Progress towards OT goals: Progressing toward goals      AM-PAC OT 6 Clicks Daily Activity     Outcome Measure   Help from another person eating meals?:  Total Help from another person taking care of personal grooming?: Total Help from another person toileting, which includes using toliet, bedpan, or urinal?: Total Help from another person bathing (including washing, rinsing, drying)?: Total Help from another person to put on and taking off regular upper body clothing?: Total Help from another person to put on and taking off regular lower body clothing?: Total 6 Click Score: 6    End of Session    OT Visit Diagnosis: Unsteadiness on feet (R26.81);Repeated falls (R29.6);Muscle weakness (generalized) (M62.81)   Activity Tolerance Patient tolerated treatment well   Patient Left in bed;with call bell/phone within reach;with bed alarm set;with family/visitor present   Nurse Communication Mobility status        Time: 8987-8967 OT Time Calculation (min): 20 min  Charges: OT General Charges $OT Visit: 1 Visit OT Treatments $Therapeutic Activity: 8-22 mins  Izetta Claude, MS, OTR/L , CBIS ascom [phone]  02/18/24, 11:47 AM

## 2024-02-18 NOTE — Progress Notes (Signed)
 Physical Therapy Treatment Patient Details Name: Edward Weber MRN: [id number] DOB: Dec 26, 1960 Today's Date: 02/18/2024   History of Present Illness 63 y.o male with significant PMH of HTN, CVA, sickle cell anemia, GERD, polysubstance abuse (EtOH abuse disorder, cocaine, marijuana tobacco abuse), and recurrent syncope who presented to the ED with chief complaints of syncopal episode. Per chart review, he has been having exertional chest pain, DOP for the past few days and today felt dizzy and passed out without hitting his head. Pt transferred to ICU on 10/26. MRI imaging from 11/01 revealing possible subacute ischemia in left centrum semiovale. Pt recently extubated on 10/30. [name]    PT Comments  Pt received upright in bed sleeping with daughter present. Pt initially lethargic but does say yes to being agreeable to PT. Pt still with minimal use of RUE and none noticed on LUE needing totalA+2 for bed mobility. Pt becomes alert with sitting. Pt sits ~6-8 minutes needing occasional minA for 2-3 seconds but mostly reliant on maxA+1 for upright posture. Pt with regular L lateral lean with PT assist with multiple R lateral weight shift to neutral with R hand placement on EOB to work on neutral sitting balance. Pt requires totalA+2 back to upright in bed with all needs in reach. Ue's elevated on pillows due to swelling. D/c recs remain appropriate.    If plan is discharge home, recommend the following: Two people to help with walking and/or transfers;Two people to help with bathing/dressing/bathroom;Direct supervision/assist for medications management;Direct supervision/assist for financial management;Assist for transportation;Help with stairs or ramp for entrance;Supervision due to cognitive status   Can travel by private vehicle     No  Equipment Recommendations  Other (comment) (TBD)    Recommendations for Other Services       Precautions / Restrictions Precautions Precautions: Fall Recall of  Precautions/Restrictions: Impaired Restrictions Weight Bearing Restrictions Per Provider Order: No     Mobility  Bed Mobility Overal bed mobility: Needs Assistance Bed Mobility: Supine to Sit, Sit to Supine     Supine to sit: Total assist, +2 for physical assistance Sit to supine: Total assist, +2 for physical assistance     Patient Response: Cooperative, Flat affect  Transfers                   General transfer comment: pt unable due to continuous, poor sitting balance    Ambulation/Gait                   Stairs             Wheelchair Mobility     Tilt Bed Tilt Bed Patient Response: Cooperative, Flat affect  Modified Rankin (Stroke Patients Only)       Balance Overall balance assessment: Needs assistance Sitting-balance support: Feet unsupported, No upper extremity supported Sitting balance-Leahy Scale: Poor Sitting balance - Comments: Very brief periods of minA but mostly requiring max A                                    Communication Communication Communication: Impaired Factors Affecting Communication: Reduced clarity of speech  Cognition Arousal: Lethargic, Alert Behavior During Therapy: Flat affect   PT - Cognitive impairments: Difficult to assess, Initiation, Sequencing Difficult to assess due to: Impaired communication                       Following commands: Impaired  Following commands impaired: Follows one step commands inconsistently    Cueing Cueing Techniques: Verbal cues, Gestural cues, Visual cues  Exercises Other Exercises Other Exercises: assisted return out of L lateral shift to assist in improving core control in sitting    General Comments        Pertinent Vitals/Pain Pain Assessment Pain Assessment: Faces Faces Pain Scale: Hurts whole lot Pain Location: pt unable to vocalize to PT where he was hurting Pain Descriptors / Indicators: Grimacing, Discomfort, Moaning Pain  Intervention(s): Limited activity within patient's tolerance, Monitored during session, Repositioned    Home Living                          Prior Function            PT Goals (current goals can now be found in the care plan section) Acute Rehab PT Goals Patient Stated Goal: none stated PT Goal Formulation: With patient Time For Goal Achievement: 02/28/24 Potential to Achieve Goals: Fair Progress towards PT goals: Progressing toward goals    Frequency    Min 2X/week      PT Plan      Co-evaluation              AM-PAC PT 6 Clicks Mobility   Outcome Measure  Help needed turning from your back to your side while in a flat bed without using bedrails?: Total Help needed moving from lying on your back to sitting on the side of a flat bed without using bedrails?: Total Help needed moving to and from a bed to a chair (including a wheelchair)?: Total Help needed standing up from a chair using your arms (e.g., wheelchair or bedside chair)?: Total Help needed to walk in hospital room?: Total Help needed climbing 3-5 steps with a railing? : Total 6 Click Score: 6    End of Session   Activity Tolerance: Patient limited by fatigue Patient left: in bed;with call bell/phone within reach;with bed alarm set Nurse Communication: Mobility status PT Visit Diagnosis: Muscle weakness (generalized) (M62.81);Unsteadiness on feet (R26.81);Other abnormalities of gait and mobility (R26.89);Difficulty in walking, not elsewhere classified (R26.2)     Time: 8897-8879 PT Time Calculation (min) (ACUTE ONLY): 18 min  Charges:    $Therapeutic Activity: 8-22 mins PT General Charges $$ ACUTE PT VISIT: 1 Visit                    Dorina HERO. Fairly IV, PT, DPT Physical Therapist- [place]  [facility] 02/18/2024, 12:36 PM

## 2024-02-18 NOTE — Telephone Encounter (Signed)
 Pharmacy Patient Advocate Encounter  Insurance verification completed.    The patient is insured through [facility]. Patient has Medicare and is not eligible for a copay card, but may be able to apply for patient assistance or Medicare RX Payment Plan (Patient Must reach out to their plan, if eligible for payment plan), if available.    Ran test claim for Eliquis 5mg  Tablets and the current 30 day co-pay is $0.  Ran test claim for Xarelto 20mg  Tablets and the current 30 day co-pay is $0.   This test claim was processed through [employer]- copay amounts may vary at other pharmacies due to [employer], or as the patient moves through the different stages of their insurance plan.

## 2024-02-18 NOTE — Plan of Care (Signed)

## 2024-02-18 NOTE — Progress Notes (Signed)
 PROGRESS NOTE    Edward Weber  FMW:[id number] DOB: 1960/08/27 DOA: 02/04/2024 PCP: [name], MD     Brief Narrative:   From admission h and p  Patient is a 63 years old male with past medical history of hypertension, stroke, history of sickle cell anemia, GERD polysubstance abuse presented to the hospital with complaints of dizziness and passing out spell while he was trying to walk to his bathroom.  He also had some shortness of breath and exertional chest pain and dyspnea on exertion for past few days.  Family was able to catch him and lowered him to the floor but patient complains of pain on the back.  There was report that recently his medications including gabapentin  and amlodipine were removed.  Patient states that he has been smoking cigarettes and he uses cocaine.  Denies any nausea vomiting or abdominal pain.  Denies any urinary urgency frequency or dysuria.  Denies any dizziness lightheadedness currently.  Denies any sick contacts or recent travel.   In the ED, patient had elevated blood pressure at 203/102.  Afebrile with a temperature of 98.1 F.  Labs were notable for normal WBC with hemoglobin of 11.5.  BMP with creatinine of 1.0.  BNP within normal range at 81.  COVID influenza and RSV was negative.  Chest x-ray showed no acute infiltrate.  CT angiogram of the chest showed no evidence of pulmonary embolism.  CT head scan was negative for acute findings.  Patient was then considered for admission to hospital for syncope.  Assessment & Plan:   Principal Problem:   Alcohol withdrawal with delirium (HCC) Active Problems:   Syncope   Chest pain   Alcohol abuse   Essential hypertension   GERD without esophagitis   History of CVA (cerebrovascular accident)   T2DM (type 2 diabetes mellitus) (HCC)   Chest pain with moderate risk for cardiac etiology  # Alcohol use disorder with complicated withdrawal Developed agitation and delirium day of admission. Per daughter  heavy drinker with history of withdrawal, starts drinking first thing in the morning. Now no longer withdrawing - continue vitamins  # B12 deficiency Less than 150. Received a week of 1000 mcg IM daily - weekly b12  # Encephalopathy Persistent. Etiology unclear. Serial MRIs have shown sequela of subacute infarcts but thought to be too small to explain presentation. EEG no seizure. Hiv neg. Tsh wnl. Repeat head CT. Rpr neg.  - neuro consulted 11/6, pending - continue high dose thiamine  - have started lactulose given evidence cirrhosis on imaging - will need to consider enteral feeding if PO does not improve in the next day or so  # Acute respiratory failure Rapid response 10/26, intubated for airway protection, now extubated and on room air - monitor  # RUE DVT Presumed 2/2 PIV now removed - dr schnier advises 3 months anticoagulation - currently on treatment dose lovenox , will continue for now as we are still working up encephalopathy  # Subacute CVA Front parietal region, no sig findings on TTE, dopplers. Neuro has signed off - asa if off anticoagulation - statin when safe for it - neuro re-consulted  # Strep pneumo pneumonia S/p treatment with ceftriaxone  # Syncope Suspect related to substance abuse. CT head nothing acute. EKG nothing acute  # Chest pain Negative w/u with serial troponins and echocardiogram and EKG. Resolved - monitor  # Substance abuse Alcohol, per h and p patient admitted to cocaine abuse as well. Uds positive for opioids and  thc  # sickle cell anemia ruled out Carries this diagnosis but electrophoresis here normal  # T2DM Appears to be a new diagnosis, A1c in the 8s. Glucose here mild elevation - monitor  # HTN Labile BPs - home amlodipine   # Aortic dilation Incidental on CTA, 4 cm - outpt surveillance  # Alcohol liver disease Cirrhotic liver on CT but no laboratory evidence of such. Hcv rna neg (prior resolved infection) - start  lactulose as above  # Debility - for snf    DVT prophylaxis: lovenox  Code Status: full Family Communication: siblings at bedside 11/7  Level of care: Progressive Status is: Inpatient     Consultants:  Psychiatry, neurology, pccm  Procedures: S/p intubation  Antimicrobials:  See above    Subjective: No complaints, sleeps but rouses somewhat,   Objective: Vitals:   02/18/24 0356 02/18/24 0500 02/18/24 0744 02/18/24 1130  BP: 125/69  (!) 150/76 (!) 142/79  Pulse: 76  86 88  Resp: 20   18  Temp: 98.7 F (37.1 C)  99.8 F (37.7 C) 98.7 F (37.1 C)  TempSrc: Oral  Oral Oral  SpO2: 95%  95% 95%  Weight:  80.5 kg    Height:        Intake/Output Summary (Last 24 hours) at 02/18/2024 1407 Last data filed at 02/18/2024 1009 Gross per 24 hour  Intake 1283.55 ml  Output 1525 ml  Net -241.45 ml   Filed Weights   02/16/24 0439 02/17/24 0436 02/18/24 0500  Weight: 83.8 kg 82.3 kg 80.5 kg    Examination:  General exam: calm, chronically ill, sleeping, rouses somewhat Respiratory system: scattered rhonchi otherwise clear Cardiovascular system: S1 & S2 heard, RRR.  Gastrointestinal system: Abdomen is obese, soft and nontender.   Central nervous system: arouses somewhat, doesn't follow commands Extremities: warm Skin: No rashes, lesions or ulcers. Depigmented skin Psychiatry: calm, obtunded    Data Reviewed: I have personally reviewed following labs and imaging studies  CBC: Recent Labs  Lab 02/13/24 0340 02/14/24 0519 02/15/24 0600 02/16/24 0441 02/17/24 1257  WBC 11.1* 13.4* 12.3* 14.8* 15.9*  NEUTROABS  --   --   --   --  10.8*  HGB 8.8* 9.7* 10.4* 9.6* 9.4*  HCT 27.5* 30.5* 33.5* 30.1* 29.6*  MCV 95.2 95.6 98.0 95.3 96.1  PLT 331 420* 432* 469* 515*   Basic Metabolic Panel: Recent Labs  Lab 02/12/24 0535 02/13/24 0340 02/14/24 0519 02/15/24 0600 02/16/24 0441 02/17/24 1257  NA 140 144 144 147* 143 138  K 4.3 4.6 4.4 4.8 4.6 4.3  CL 107  109 106 110 109 102  CO2 23 24 24 23 23 23   GLUCOSE 233* 255* 143* 127* 147* 114*  BUN 36* 39* 39* 42* 48* 48*  CREATININE 1.12 1.11 1.09 1.22 1.35* 1.27*  CALCIUM  8.7* 8.9 9.2 9.0 8.9 8.9  MG 2.3 2.2 2.3 2.4 2.3  --   PHOS 3.6 3.9  --   --   --   --    GFR: Estimated Creatinine Clearance: 56.1 mL/min (A) (by C-G formula based on SCr of 1.27 mg/dL (H)). Liver Function Tests: Recent Labs  Lab 02/13/24 0340 02/17/24 1257  AST  --  50*  ALT  --  55*  ALKPHOS  --  93  BILITOT  --  0.6  PROT  --  7.4  ALBUMIN 2.5* 2.7*   No results for input(s): LIPASE, AMYLASE in the last 168 hours. No results for input(s): AMMONIA in the  last 168 hours.  Coagulation Profile: No results for input(s): INR, PROTIME in the last 168 hours. Cardiac Enzymes: No results for input(s): CKTOTAL, CKMB, CKMBINDEX, TROPONINI in the last 168 hours. BNP (last 3 results) No results for input(s): PROBNP in the last 8760 hours. HbA1C: No results for input(s): HGBA1C in the last 72 hours.  CBG: Recent Labs  Lab 02/17/24 2023 02/18/24 0027 02/18/24 0333 02/18/24 0737 02/18/24 1128  GLUCAP 100* 137* 111* 134* 123*   Lipid Profile: No results for input(s): CHOL, HDL, LDLCALC, TRIG, CHOLHDL, LDLDIRECT in the last 72 hours. Thyroid Function Tests: No results for input(s): TSH, T4TOTAL, FREET4, T3FREE, THYROIDAB in the last 72 hours.  Anemia Panel: No results for input(s): VITAMINB12, FOLATE, FERRITIN, TIBC, IRON, RETICCTPCT in the last 72 hours. Urine analysis:    Component Value Date/Time   COLORURINE YELLOW (A) 02/17/2024 1425   APPEARANCEUR HAZY (A) 02/17/2024 1425   APPEARANCEUR Clear 01/17/2020 1550   LABSPEC 1.020 02/17/2024 1425   LABSPEC 1.010 04/01/2014 1721   PHURINE 5.0 02/17/2024 1425   GLUCOSEU NEGATIVE 02/17/2024 1425   GLUCOSEU Negative 04/01/2014 1721   HGBUR NEGATIVE 02/17/2024 1425   BILIRUBINUR NEGATIVE 02/17/2024 1425    BILIRUBINUR Negative 01/17/2020 1550   BILIRUBINUR Negative 04/01/2014 1721   KETONESUR NEGATIVE 02/17/2024 1425   PROTEINUR NEGATIVE 02/17/2024 1425   NITRITE NEGATIVE 02/17/2024 1425   LEUKOCYTESUR NEGATIVE 02/17/2024 1425   LEUKOCYTESUR Negative 04/01/2014 1721   Sepsis Labs: @LABRCNTIP (procalcitonin:4,lacticidven:4)  ) Recent Results (from the past 240 hours)  Culture, blood (Routine X 2) w Reflex to ID Panel     Status: None (Preliminary result)   Collection Time: 02/17/24  4:41 PM   Specimen: BLOOD LEFT ARM  Result Value Ref Range Status   Specimen Description BLOOD LEFT ARM  Final   Special Requests   Final    BOTTLES DRAWN AEROBIC ONLY Blood Culture adequate volume   Culture   Final    NO GROWTH < 12 HOURS Performed at [facility], [address] [place], [place] [address]    Report Status PENDING  Incomplete  Culture, blood (Routine X 2) w Reflex to ID Panel     Status: None (Preliminary result)   Collection Time: 02/17/24  4:42 PM   Specimen: BLOOD LEFT ARM  Result Value Ref Range Status   Specimen Description BLOOD LEFT ARM  Final   Special Requests   Final    BOTTLES DRAWN AEROBIC ONLY Blood Culture adequate volume   Culture   Final    NO GROWTH < 12 HOURS Performed at [facility], [address] [place], [place] [address]    Report Status PENDING  Incomplete          Radiology Studies: CT HEAD WO CONTRAST ( ) Result Date: 02/17/2024 EXAM: CT HEAD WITHOUT CONTRAST 02/17/2024 12:06:27 PM TECHNIQUE: CT of the head was performed without the administration of intravenous contrast. Automated exposure control, iterative reconstruction, and/or weight based adjustment of the mA/kV was utilized to reduce the radiation dose to as low as reasonably achievable. COMPARISON: 02/04/2024 CLINICAL HISTORY: left sided weakness FINDINGS: BRAIN AND VENTRICLES: No acute hemorrhage. No evidence of acute infarct. Remote lacunar infarcts in bilateral deep  gray nuclei. Patchy white matter hypodensities compatible with chronic microvascular ischemic disease. No hydrocephalus. No extra-axial collection. No mass effect or midline shift. ORBITS: Remote left lamina papyracea fracture. SINUSES: No acute abnormality. SOFT TISSUES AND SKULL: No acute soft tissue abnormality. Prior right frontal craniotomy. No skull  fracture. IMPRESSION: 1. No acute intracranial abnormality. 2. Remote lacunar infarcts in the bilateral deep gray nuclei. 3. Chronic microvascular ischemic disease changes in the white matter. Electronically signed by: Lonni Necessary MD 02/17/2024 12:20 PM EST RP Workstation: [id number]         Scheduled Meds:  amLODipine  10 mg Oral Daily   Chlorhexidine Gluconate Cloth  6 each Topical Daily   [START ON 02/21/2024] cyanocobalamin  1,000 mcg Intramuscular Q7 days   enoxaparin  (LOVENOX ) injection  1 mg/kg Subcutaneous Q12H   feeding supplement ([employer] STANDARD ENT 1.4)  325 mL Oral TID BM   folic acid   1 mg Oral Daily   insulin aspart  0-15 Units Subcutaneous Q4H   insulin glargine-yfgn  10 Units Subcutaneous Daily   lactulose  20 g Oral BID   multivitamin with minerals  1 tablet Oral Daily   nicotine  21 mg Transdermal Daily   pantoprazole   40 mg Oral Daily   Continuous Infusions:  sodium chloride  100 mL/hr at 02/18/24 0559   thiamine  (VITAMIN B1) injection 500 mg (02/18/24 0853)      LOS: 14 days     Devaughn KATHEE Ban, MD [facility]   If 7PM-7AM, please contact night-coverage [email or web address] Password [email or web address] 02/18/2024, 2:07 PM

## 2024-02-18 NOTE — Consult Note (Signed)
 [facility] Psychiatric Consult Follow up  Patient Name: .Edward Weber  MRN: [id number]  DOB: 09/21/1960  Consult Order details:  Orders (From admission, onward)     Start     Ordered   02/04/24 1154  IP CONSULT TO PSYCHIATRY       Ordering Provider: Sonjia Held, MD  Provider:  (Not yet assigned)  Question Answer Comment  Location [facility]   Reason for Consult? aggressiveness, refusing medical care, unsafe with ambulation, IVced for safety      02/04/24 1154             Mode of Visit: In person    Psychiatry Consult Evaluation  Service Date: February 18, 2024 LOS:  LOS: 14 days  Chief Complaint agitation  Primary Psychiatric Diagnoses  Agitation AUD Polysubstance use   Assessment  Edward Weber is a 63 y.o. male admitted: Medically past medical history of hypertension, CVA, sickle cell anemia, GERD, polysubstance abuse including EtOH cocaine and marijuana presented to the ED for syncopal episode. He was intubated for airway protection on 10/26. Chest x-ray without any significant findings, and has since been extubated.  Per nursing he has not been aggressive or refusing care since then.  On exam this morning he was alert and oriented.  He is a poor historian at times but responded to questions appropriately denied SI, HI, AVH.  He was not refusing further care and was not agitated or aggressive.  Discussed with nursing who indicated there is a sitter order but the patient does not continue to require sitter.  He is irritable but not aggressive.  Primary team continues to manage delirium neurology notes have been reviewed.  UDS on arrival was positive for opioids and cannabis.  Further labs were reviewed.  Though patient was appropriately oriented they are a poor historian. Will revisit tomorrow for further assessment.   02/15/2024: Patient was seen by psychiatry on rounds today.  Patient was still noted to fall asleep intermittently during  assessment.  Patient could provide limited history today during assessment.  When asked what brought patient to the hospital, patient stated I messed up, I started hanging with friends and drinking.  Patient's sister was at bedside with patient.  Patient's sister very supportive.  Patient's sister reported that patient had 2 blood clots found in his right arm and that his left side was paralyzed from  mini strokes that were found when patient was in ICU.  Patient's sister was tearful when discussing patient's current status and reported that patient will need a lot of assistance when discharged from the hospital in regards to ADLs.  Patient's sister reported patient is normally independent with all of his ADLs and that this will be a big change to patient's daily routine.  Patient sister reported they did not know how severe patient's medical history was until this hospital admission as patient was very private about his medical care leading up to this.  Psychiatry exam was limited today due to patient's current mental status.  Psychiatry will continue to round on patient to attempt further assessment when patient is more alert and oriented.  02/16/2024: Patient was seen today on rounds by psychiatry.  Patient was more alert, but did appear to have trouble getting his words out.  Patient was able to shake head yes or no in response to assessment questions.  Patient shook head no when asked about suicidal or homicidal ideations.  Patient also shook his head no when asked  about auditory or visual hallucinations.  Per chart review and nursing staff, patient has not had any recent episodes of agitation.On current presentation there was no evidence of psychosis or mania and patient did not appear to be responding to internal stimuli. At this time, patient does not appear to be a risk to self or others.  Patient did shake head yes when asked if interested in substance use resources.  However currently, medically  patient is unable to use his left side of his body.  Patient will likely require rehab or other assistive services after medically discharged from hospital.  We will provide resources for patient to follow-up with, regarding substance use, but we discussed that patient would likely be referred for medical rehab acutely after discharge from hospital.   02/18/2024: Patient was seen today on rounds today by psychiatry.  Patient noted to be resting in bed with family at bedside.  Per review of chart, it appears patient continues to have no aggressive behaviors/agitation.  Patient has displayed no self-harm behaviors during admission.  Patient appears to be cooperative with current medication regimen.  At this time, psychiatry will sign off.  Please reconsult if any new needs arise.  Social work team has already attached resources to patient's AVS regarding substance use.  Diagnoses:  Active Hospital problems: Principal Problem:   Alcohol withdrawal with delirium (HCC) Active Problems:   Alcohol abuse   Syncope   Essential hypertension   GERD without esophagitis   Chest pain   History of CVA (cerebrovascular accident)   T2DM (type 2 diabetes mellitus) (HCC)   Chest pain with moderate risk for cardiac etiology    Plan   ## Psychiatric Medication Recommendations:  None at this time defer to primary team  ## Medical Decision Making Capacity: Not specifically addressed in this encounter  ## Further Work-up:   -- most recent Qtc 481 -- Pertinent labwork reviewed   ## Disposition:--Psychiatry will sign off  ## Behavioral / Environmental: -Delirium Precautions: Delirium Interventions for Nursing and Staff: - RN to open blinds every AM. - To Bedside: Glasses, hearing aide, and pt's own shoes. Make available to patients. when possible and encourage use. - Encourage po fluids when appropriate, keep fluids within reach. - OOB to chair with meals. - Passive ROM exercises to all extremities with AM &  PM care. - RN to assess orientation to person, time and place QAM and PRN. - Recommend extended visitation hours with familiar family/friends as feasible. - Staff to minimize disturbances at night. Turn off television when pt asleep or when not in use.    ## Safety and Observation Level:  - Based on my clinical evaluation, I estimate the patient to be at low risk of self harm in the current setting. - At this time, we recommend  routine. This decision is based on my review of the chart including patient's history and current presentation, interview of the patient, mental status examination, and consideration of suicide risk including evaluating suicidal ideation, plan, intent, suicidal or self-harm behaviors, risk factors, and protective factors. This judgment is based on our ability to directly address suicide risk, implement suicide prevention strategies, and develop a safety plan while the patient is in the clinical setting. Please contact our team if there is a concern that risk level has changed.  CSSR Risk Category:C-SSRS RISK CATEGORY: No Risk  Thank you for this consult request. Recommendations have been communicated to the primary team.  We will sign off at this time.  Zelda Sharps, NP        History of Present Illness  Relevant Aspects of [facility]   Patient Report:  Patient is a 63 year old male medically admitted is a past medical history of hypertension, CVA, sickle cell anemia, GERD, polysubstance use including alcohol cocaine and marijuana.  He initially presented to the ED for syncopal episode he was later intubated for airway protection on the 26 and was extubated yesterday.  Today he is appropriately oriented but he is a poor historian.  He denies thoughts of harming self or others.  He is frustrated with someone that he lives with and indicates he plans to pick them.  He denied all substance use despite positive drug screen.  There is no ethanol on arrival reviewed CMP  as well.  Is unclear patient is interested in mental health services.  Denied depressive symptoms.  Was irritable and did not fully participate in exam.  Will continue to reassess.     Psychiatric and Social History  Psychiatric History: Patient is a poor historian will continue to address there is a history of polysubstance abuse.  Concerning for cocaine, opioids, and alcohol.  On interview today patient denied all substance use.  Patient denied psychiatric history.  Denied current thoughts of harming self or others.  Will continue to reassess. Exam Findings  Physical Exam: alert and NAD Vital Signs:  Temp:  [97.7 F (36.5 C)-99.8 F (37.7 C)] 98.7 F (37.1 C) (11/07 1130) Pulse Rate:  [76-88] 88 (11/07 1130) Resp:  [15-20] 18 (11/07 1130) BP: (125-150)/(68-82) 142/79 (11/07 1130) SpO2:  [95 %-96 %] 95 % (11/07 1130) Weight:  [80.5 kg] 80.5 kg (11/07 0500) Blood pressure (!) 142/79, pulse 88, temperature 98.7 F (37.1 C), temperature source Oral, resp. rate 18, height 5' 8 (1.727 m), weight 80.5 kg, SpO2 95%. Body mass index is 26.98 kg/m.        Other History   These have been pulled in through the EMR, reviewed, and updated if appropriate.  Family History:  The patient's family history is not on file.  Medical History: Past Medical History:  Diagnosis Date   Alcoholism (HCC)    Allergic rhinitis    Allergic state    Anemia    Arthritis    Cancer (HCC)    Chronic hepatitis C (HCC)    Depression    Esophagitis, reflux    GERD (gastroesophageal reflux disease)    Gout    Helicobacter pylori gastritis    Hepatitis    Hypertension    Neuromuscular disorder (HCC)    Osteoarthrosis    Peripheral neuropathy    Rhabdomyolysis    Sickle cell anemia (HCC)    Stroke (HCC)    Stroke [facility])    Vitiligo     Surgical History: Past Surgical History:  Procedure Laterality Date   COLON SURGERY     COLONOSCOPY WITH PROPOFOL  N/A 05/09/2015   Procedure: COLONOSCOPY WITH  PROPOFOL ;  Surgeon: Deward CINDERELLA Piedmont, MD;  Location: [facility] ENDOSCOPY;  Service: Gastroenterology;  Laterality: N/A;   ESOPHAGOGASTRODUODENOSCOPY N/A 05/09/2015   Procedure: ESOPHAGOGASTRODUODENOSCOPY (EGD);  Surgeon: Deward CINDERELLA Piedmont, MD;  Location: [facility] ENDOSCOPY;  Service: Gastroenterology;  Laterality: N/A;   ESOPHAGOGASTRODUODENOSCOPY (EGD) WITH PROPOFOL  N/A 05/04/2017   Procedure: ESOPHAGOGASTRODUODENOSCOPY (EGD) WITH PROPOFOL ;  Surgeon: Toledo, Ladell POUR, MD;  Location: [facility] ENDOSCOPY;  Service: Gastroenterology;  Laterality: N/A;   FINGER DEBRIDEMENT  1976   s/p infected dog bite   TEE WITHOUT CARDIOVERSION N/A 05/20/2012  Procedure: TRANSESOPHAGEAL ECHOCARDIOGRAM (TEE);  Surgeon: Vina LULLA Gull, MD;  Location: [facility] ENDOSCOPY;  Service: Cardiovascular;  Laterality: N/A;     Medications:   Current Facility-Administered Medications:    0.9 %  sodium chloride  infusion, , Intravenous, Continuous, Wouk, Devaughn Sayres, MD, Last Rate: 100 mL/hr at 02/18/24 0559, New Bag at 02/18/24 0559   acetaminophen  (TYLENOL ) tablet 650 mg, 650 mg, Oral, Q6H PRN, 650 mg at 02/15/24 2128 **OR** acetaminophen  (TYLENOL ) suppository 650 mg, 650 mg, Rectal, Q6H PRN, Pokhrel, Laxman, MD   amLODipine (NORVASC) tablet 10 mg, 10 mg, Oral, Daily, Chappell, Alex B, RPH, 10 mg at 02/18/24 9162   Chlorhexidine Gluconate Cloth 2 % PADS 6 each, 6 each, Topical, Daily, Assaker, Jean-Pierre, MD, 6 each at 02/17/24 2030   [START ON 02/21/2024] cyanocobalamin (VITAMIN B12) injection 1,000 mcg, 1,000 mcg, Intramuscular, Q7 days, Wouk, Devaughn Sayres, MD   enoxaparin  (LOVENOX ) injection 85 mg, 1 mg/kg, Subcutaneous, Q12H, Assaker, Jean-Pierre, MD, 85 mg at 02/18/24 1201   feeding supplement (KATE FARMS STANDARD ENT 1.4) liquid 325 mL, 325 mL, Oral, TID BM, Trudy Anthony HERO, MD, 325 mL at 02/18/24 0856   folic acid  (FOLVITE ) tablet 1 mg, 1 mg, Oral, Daily, Clair Marolyn NOVAK, RPH, 1 mg at 02/18/24 0837   insulin aspart (novoLOG) injection 0-15 Units,  0-15 Units, Subcutaneous, Q4H, Keene, Jeremiah D, NP, 2 Units at 02/18/24 1201   insulin glargine-yfgn (SEMGLEE) injection 10 Units, 10 Units, Subcutaneous, Daily, Assaker, Jean-Pierre, MD, 10 Units at 02/18/24 0838   ipratropium-albuterol  (DUONEB) 0.5-2.5 (3) MG/3ML nebulizer solution 3 mL, 3 mL, Nebulization, Q6H PRN, Ouma, Almarie Bake, NP   labetalol  (NORMODYNE ) injection 20 mg, 20 mg, Intravenous, Q2H PRN, Wouk, Devaughn Sayres, MD, 20 mg at 02/14/24 0911   lactulose (CHRONULAC) 10 GM/15ML solution 20 g, 20 g, Oral, BID, Wouk, Devaughn Sayres, MD, 20 g at 02/18/24 9157   multivitamin with minerals tablet 1 tablet, 1 tablet, Oral, Daily, Pokhrel, Laxman, MD, 1 tablet at 02/18/24 0837   nicotine (NICODERM CQ - dosed in mg/24 hours) patch 21 mg, 21 mg, Transdermal, Daily, Pokhrel, Laxman, MD, 21 mg at 02/18/24 0855   Oral care mouth rinse, 15 mL, Mouth Rinse, PRN, Assaker, Jean-Pierre, MD, 15 mL at 02/13/24 2000   Oral care mouth rinse, 15 mL, Mouth Rinse, PRN, Mansy, Jan A, MD   pantoprazole  (PROTONIX ) EC tablet 40 mg, 40 mg, Oral, Daily, Nazari, Walid A, RPH, 40 mg at 02/18/24 0837   polyethylene glycol (MIRALAX / GLYCOLAX) packet 17 g, 17 g, Oral, Daily PRN, Pokhrel, Laxman, MD   thiamine  (VITAMIN B1) 500 mg in sodium chloride  0.9 % 50 mL IVPB, 500 mg, Intravenous, TID, Wouk, Devaughn Sayres, MD, Last Rate: 110 mL/hr at 02/18/24 0853, 500 mg at 02/18/24 0853  Allergies: Allergies  Allergen Reactions   Bee Venom Anaphylaxis   Bovine (Beef) Protein Other (See Comments)    Personal preference   Bovine (Beef) Protein-Containing Drug Products     Personal preference   Lactalbumin    Milk-Related Compounds Nausea And Vomiting    Intoloerance   Pegademase Bovine     Other reaction(s): Unknown Personal preference Personal preference Personal preference    Tilactase Nausea And Vomiting    Intoloerance   Milk (Cow) Nausea And Vomiting    Intoloerance    Zelda Sharps, NP This note was  created using Scientist, clinical (histocompatibility and immunogenetics). Please excuse any inadvertent transcription errors. Case was discussed with supervising physician Dr. Jadapalle who is agreeable with current  plan.

## 2024-02-19 DIAGNOSIS — K219 Gastro-esophageal reflux disease without esophagitis: Secondary | ICD-10-CM

## 2024-02-19 DIAGNOSIS — I10 Essential (primary) hypertension: Secondary | ICD-10-CM | POA: Diagnosis not present

## 2024-02-19 DIAGNOSIS — R079 Chest pain, unspecified: Secondary | ICD-10-CM | POA: Diagnosis not present

## 2024-02-19 DIAGNOSIS — R55 Syncope and collapse: Secondary | ICD-10-CM | POA: Diagnosis not present

## 2024-02-19 DIAGNOSIS — F10931 Alcohol use, unspecified with withdrawal delirium: Secondary | ICD-10-CM | POA: Diagnosis not present

## 2024-02-19 DIAGNOSIS — G9341 Metabolic encephalopathy: Secondary | ICD-10-CM

## 2024-02-19 DIAGNOSIS — Z8673 Personal history of transient ischemic attack (TIA), and cerebral infarction without residual deficits: Secondary | ICD-10-CM

## 2024-02-19 LAB — GLUCOSE, CAPILLARY
Glucose-Capillary: 125 mg/dL — ABNORMAL HIGH (ref 70–99)
Glucose-Capillary: 139 mg/dL — ABNORMAL HIGH (ref 70–99)
Glucose-Capillary: 124 mg/dL — ABNORMAL HIGH (ref 70–99)
Glucose-Capillary: 110 mg/dL — ABNORMAL HIGH (ref 70–99)
Glucose-Capillary: 138 mg/dL — ABNORMAL HIGH (ref 70–99)
Glucose-Capillary: 238 mg/dL — ABNORMAL HIGH (ref 70–99)

## 2024-02-19 MED ORDER — GABAPENTIN 100 MG PO CAPS
100.0000 mg | ORAL_CAPSULE | Freq: Two times a day (BID) | ORAL | Status: DC
  Administered 2024-02-19 – 2024-02-22 (×6): 100 mg via ORAL
  Filled 2024-02-19 (×6): qty 1

## 2024-02-19 MED ORDER — ALLOPURINOL 100 MG PO TABS
100.0000 mg | ORAL_TABLET | Freq: Every day | ORAL | Status: DC
  Administered 2024-02-19 – 2024-02-22 (×4): 100 mg via ORAL
  Filled 2024-02-19 (×4): qty 1

## 2024-02-19 NOTE — Progress Notes (Signed)
 PROGRESS NOTE    Edward Weber  FMW:[id number] DOB: 09-24-1960 DOA: 02/04/2024 PCP: [name], MD     Brief Narrative:   From admission h and p  Patient is a 63 years old male with past medical history of hypertension, stroke, history of sickle cell anemia, GERD polysubstance abuse presented to the hospital with complaints of dizziness and passing out spell while he was trying to walk to his bathroom.  He also had some shortness of breath and exertional chest pain and dyspnea on exertion for past few days.  Family was able to catch him and lowered him to the floor but patient complains of pain on the back.  There was report that recently his medications including gabapentin  and amlodipine were removed.  Patient states that he has been smoking cigarettes and he uses cocaine.  Denies any nausea vomiting or abdominal pain.  Denies any urinary urgency frequency or dysuria.  Denies any dizziness lightheadedness currently.  Denies any sick contacts or recent travel.   In the ED, patient had elevated blood pressure at 203/102.  Afebrile with a temperature of 98.1 F.  Labs were notable for normal WBC with hemoglobin of 11.5.  BMP with creatinine of 1.0.  BNP within normal range at 81.  COVID influenza and RSV was negative.  Chest x-ray showed no acute infiltrate.  CT angiogram of the chest showed no evidence of pulmonary embolism.  CT head scan was negative for acute findings.  Patient was then considered for admission to hospital for syncope.  11/8: Patient little more interactive and started eating.  Ammonia levels were normal.  Assessment & Plan:   Principal Problem:   Alcohol withdrawal with delirium (HCC) Active Problems:   Syncope   Chest pain   Alcohol abuse   Essential hypertension   GERD without esophagitis   History of CVA (cerebrovascular accident)   T2DM (type 2 diabetes mellitus) (HCC)   Chest pain with moderate risk for cardiac etiology  # Alcohol use disorder with  complicated withdrawal Developed agitation and delirium day of admission. Per daughter heavy drinker with history of withdrawal, starts drinking first thing in the morning. Now no longer withdrawing - continue vitamins  # B12 deficiency Less than 150. Received a week of 1000 mcg IM daily - weekly b12  # Encephalopathy Persistent. Etiology unclear. Serial MRIs have shown sequela of subacute infarcts but thought to be too small to explain presentation. EEG no seizure. Hiv neg. Tsh wnl. Repeat head CT. Rpr neg.  - neuro consulted 11/6,-repeat ammonia levels normal, repeat EEG done with pending results. - continue high dose thiamine  - have started lactulose given evidence cirrhosis on imaging, ammonia levels remain normal - Improving p.o. intake now. - Patient is on high-dose thiamine , likely secondary to [facility] which is high on differential.  # Acute respiratory failure Rapid response 10/26, intubated for airway protection, now extubated and on room air - monitor  # RUE DVT Presumed 2/2 PIV now removed - dr schnier advises 3 months anticoagulation - currently on treatment dose lovenox , will continue for now as we are still working up encephalopathy, likely will switch to Eliquis, once cleared from neurology will  # Subacute CVA Front parietal region, no sig findings on TTE, dopplers. Neuro has signed off - asa if off anticoagulation - statin when safe for it - neuro re-consulted  # Strep pneumo pneumonia S/p treatment with ceftriaxone  # Syncope Suspect related to substance abuse. CT head nothing acute. EKG nothing  acute  # Chest pain Negative w/u with serial troponins and echocardiogram and EKG. Resolved - monitor  # Substance abuse Alcohol, per h and p patient admitted to cocaine abuse as well. Uds positive for opioids and thc  # sickle cell anemia ruled out Carries this diagnosis but electrophoresis here normal  # T2DM Appears to be a new diagnosis, A1c in the 8s.  Glucose here mild elevation, CBG currently within goal -Continue with SSI - monitor  # HTN Labile BPs - home amlodipine   # Aortic dilation Incidental on CTA, 4 cm - outpt surveillance  # Alcohol liver disease Cirrhotic liver on CT but no laboratory evidence of such. Hcv rna neg (prior resolved infection) - start lactulose as above  # Debility - for snf  DVT prophylaxis: lovenox  Code Status: full Family Communication:   Level of care: Progressive Status is: Inpatient  Consultants:  Psychiatry, neurology, pccm  Procedures: S/p intubation  Antimicrobials:  See above    Subjective: Patient was being fed when seen today.  Able to communicate somewhat.  No new concern and feeling improved.   Objective: Vitals:   02/19/24 0430 02/19/24 0500 02/19/24 0726 02/19/24 1158  BP: (!) 156/89  (!) 155/80 (!) 144/80  Pulse: 86  92 83  Resp: 20  18 16   Temp: 98.4 F (36.9 C)  98.3 F (36.8 C) 98.1 F (36.7 C)  TempSrc:   Oral Oral  SpO2: 97%  95% 95%  Weight:  83 kg    Height:        Intake/Output Summary (Last 24 hours) at 02/19/2024 1308 Last data filed at 02/19/2024 0600 Gross per 24 hour  Intake 1239.22 ml  Output 800 ml  Net 439.22 ml   Filed Weights   02/17/24 0436 02/18/24 0500 02/19/24 0500  Weight: 82.3 kg 80.5 kg 83 kg    Examination:  General.  Ill-appearing gentleman, in no acute distress. Pulmonary.  Lungs clear bilaterally, normal respiratory effort. CV.  Regular rate and rhythm, no JVD, rub or murmur. Abdomen.  Soft, nontender, nondistended, BS positive. CNS.  Alert and oriented .  No focal neurologic deficit. Extremities.  No edema,  pulses intact and symmetrical.  Data Reviewed: I have personally reviewed following labs and imaging studies  CBC: Recent Labs  Lab 02/13/24 0340 02/14/24 0519 02/15/24 0600 02/16/24 0441 02/17/24 1257  WBC 11.1* 13.4* 12.3* 14.8* 15.9*  NEUTROABS  --   --   --   --  10.8*  HGB 8.8* 9.7* 10.4* 9.6* 9.4*   HCT 27.5* 30.5* 33.5* 30.1* 29.6*  MCV 95.2 95.6 98.0 95.3 96.1  PLT 331 420* 432* 469* 515*   Basic Metabolic Panel: Recent Labs  Lab 02/13/24 0340 02/14/24 0519 02/15/24 0600 02/16/24 0441 02/17/24 1257 02/18/24 1432  NA 144 144 147* 143 138 135  K 4.6 4.4 4.8 4.6 4.3 4.2  CL 109 106 110 109 102 103  CO2 24 24 23 23 23  19*  GLUCOSE 255* 143* 127* 147* 114* 116*  BUN 39* 39* 42* 48* 48* 30*  CREATININE 1.11 1.09 1.22 1.35* 1.27* 1.16  CALCIUM  8.9 9.2 9.0 8.9 8.9 8.3*  MG 2.2 2.3 2.4 2.3  --   --   PHOS 3.9  --   --   --   --   --    GFR: Estimated Creatinine Clearance: 66.6 mL/min (by C-G formula based on SCr of 1.16 mg/dL). Liver Function Tests: Recent Labs  Lab 02/13/24 0340 02/17/24 1257 02/18/24 1432  AST  --  50* 57*  ALT  --  55* 59*  ALKPHOS  --  93 101  BILITOT  --  0.6 0.8  PROT  --  7.4 7.4  ALBUMIN 2.5* 2.7* 2.6*   No results for input(s): LIPASE, AMYLASE in the last 168 hours. Recent Labs  Lab 02/18/24 1621  AMMONIA 27    Coagulation Profile: No results for input(s): INR, PROTIME in the last 168 hours. Cardiac Enzymes: No results for input(s): CKTOTAL, CKMB, CKMBINDEX, TROPONINI in the last 168 hours. BNP (last 3 results) No results for input(s): PROBNP in the last 8760 hours. HbA1C: No results for input(s): HGBA1C in the last 72 hours.  CBG: Recent Labs  Lab 02/18/24 2003 02/18/24 2354 02/19/24 0426 02/19/24 0723 02/19/24 1156  GLUCAP 151* 109* 125* 139* 124*   Lipid Profile: No results for input(s): CHOL, HDL, LDLCALC, TRIG, CHOLHDL, LDLDIRECT in the last 72 hours. Thyroid Function Tests: No results for input(s): TSH, T4TOTAL, FREET4, T3FREE, THYROIDAB in the last 72 hours.  Anemia Panel: No results for input(s): VITAMINB12, FOLATE, FERRITIN, TIBC, IRON, RETICCTPCT in the last 72 hours. Urine analysis:    Component Value Date/Time   COLORURINE YELLOW (A) 02/17/2024 1425    APPEARANCEUR HAZY (A) 02/17/2024 1425   APPEARANCEUR Clear 01/17/2020 1550   LABSPEC 1.020 02/17/2024 1425   LABSPEC 1.010 04/01/2014 1721   PHURINE 5.0 02/17/2024 1425   GLUCOSEU NEGATIVE 02/17/2024 1425   GLUCOSEU Negative 04/01/2014 1721   HGBUR NEGATIVE 02/17/2024 1425   BILIRUBINUR NEGATIVE 02/17/2024 1425   BILIRUBINUR Negative 01/17/2020 1550   BILIRUBINUR Negative 04/01/2014 1721   KETONESUR NEGATIVE 02/17/2024 1425   PROTEINUR NEGATIVE 02/17/2024 1425   NITRITE NEGATIVE 02/17/2024 1425   LEUKOCYTESUR NEGATIVE 02/17/2024 1425   LEUKOCYTESUR Negative 04/01/2014 1721   Sepsis Labs: @LABRCNTIP (procalcitonin:4,lacticidven:4)  ) Recent Results (from the past 240 hours)  Culture, blood (Routine X 2) w Reflex to ID Panel     Status: None (Preliminary result)   Collection Time: 02/17/24  4:41 PM   Specimen: BLOOD LEFT ARM  Result Value Ref Range Status   Specimen Description BLOOD LEFT ARM  Final   Special Requests   Final    BOTTLES DRAWN AEROBIC ONLY Blood Culture adequate volume   Culture   Final    NO GROWTH 2 DAYS Performed at [facility], [address], [place], [place] [address]    Report Status PENDING  Incomplete  Culture, blood (Routine X 2) w Reflex to ID Panel     Status: None (Preliminary result)   Collection Time: 02/17/24  4:42 PM   Specimen: BLOOD LEFT ARM  Result Value Ref Range Status   Specimen Description BLOOD LEFT ARM  Final   Special Requests   Final    BOTTLES DRAWN AEROBIC ONLY Blood Culture adequate volume   Culture   Final    NO GROWTH 2 DAYS Performed at [facility], [address], [place], [place] [address]    Report Status PENDING  Incomplete     Radiology Studies: No results found.  Scheduled Meds:  amLODipine  10 mg Oral Daily   Chlorhexidine Gluconate Cloth  6 each Topical Daily   [START ON 02/21/2024] cyanocobalamin  1,000 mcg Intramuscular QODAY   Followed by   NOREEN ON 03/05/2024] cyanocobalamin   1,000 mcg Intramuscular Weekly   enoxaparin  (LOVENOX ) injection  1 mg/kg Subcutaneous Q12H   feeding supplement ([employer] STANDARD ENT 1.4)  325 mL Oral TID BM  folic acid   1 mg Oral Daily   insulin aspart  0-15 Units Subcutaneous Q4H   insulin glargine-yfgn  10 Units Subcutaneous Daily   lactulose  20 g Oral BID   multivitamin with minerals  1 tablet Oral Daily   nicotine  21 mg Transdermal Daily   pantoprazole   40 mg Oral Daily   [START ON 02/22/2024] thiamine  (VITAMIN B1) injection  100 mg Intravenous Q24H   Continuous Infusions:  thiamine  (VITAMIN B1) injection      LOS: 15 days   This record has been created using Conservation officer, historic buildings. Errors have been sought and corrected,but may not always be located. Such creation errors do not reflect on the standard of care.   Amaryllis Dare, MD [facility]   If 7PM-7AM, please contact night-coverage [email or web address] Password [email or web address] 02/19/2024, 1:08 PM

## 2024-02-19 NOTE — Plan of Care (Signed)
  Problem: Education: Goal: Knowledge of General Education information will improve Description: Including pain rating scale, medication(s)/side effects and non-pharmacologic comfort measures Outcome: Progressing   Problem: Clinical Measurements: Goal: Ability to maintain clinical measurements within normal limits will improve Outcome: Progressing Goal: Diagnostic test results will improve Outcome: Progressing Goal: Respiratory complications will improve Outcome: Progressing   Problem: Activity: Goal: Risk for activity intolerance will decrease Outcome: Progressing   Problem: Nutrition: Goal: Adequate nutrition will be maintained Outcome: Progressing   Problem: Pain Managment: Goal: General experience of comfort will improve and/or be controlled Outcome: Progressing

## 2024-02-20 DIAGNOSIS — F10931 Alcohol use, unspecified with withdrawal delirium: Secondary | ICD-10-CM | POA: Diagnosis not present

## 2024-02-20 DIAGNOSIS — R079 Chest pain, unspecified: Secondary | ICD-10-CM | POA: Diagnosis not present

## 2024-02-20 DIAGNOSIS — R55 Syncope and collapse: Secondary | ICD-10-CM | POA: Diagnosis not present

## 2024-02-20 DIAGNOSIS — I10 Essential (primary) hypertension: Secondary | ICD-10-CM | POA: Diagnosis not present

## 2024-02-20 LAB — CBC
HCT: 27.2 % — ABNORMAL LOW (ref 39.0–52.0)
Hemoglobin: 9.1 g/dL — ABNORMAL LOW (ref 13.0–17.0)
MCH: 31.1 pg (ref 26.0–34.0)
MCHC: 33.5 g/dL (ref 30.0–36.0)
MCV: 92.8 fL (ref 80.0–100.0)
Platelets: 549 K/uL — ABNORMAL HIGH (ref 150–400)
RBC: 2.93 MIL/uL — ABNORMAL LOW (ref 4.22–5.81)
RDW: 14.1 % (ref 11.5–15.5)
WBC: 18.2 K/uL — ABNORMAL HIGH (ref 4.0–10.5)
nRBC: 0.2 % (ref 0.0–0.2)

## 2024-02-20 LAB — GLUCOSE, CAPILLARY
Glucose-Capillary: 97 mg/dL (ref 70–99)
Glucose-Capillary: 127 mg/dL — ABNORMAL HIGH (ref 70–99)
Glucose-Capillary: 133 mg/dL — ABNORMAL HIGH (ref 70–99)
Glucose-Capillary: 127 mg/dL — ABNORMAL HIGH (ref 70–99)
Glucose-Capillary: 142 mg/dL — ABNORMAL HIGH (ref 70–99)

## 2024-02-20 LAB — CREATININE, SERUM
Creatinine, Ser: 0.98 mg/dL (ref 0.61–1.24)
GFR, Estimated: >60 mL/min (ref >60)

## 2024-02-20 MED ORDER — COLCHICINE 0.6 MG PO TABS
0.6000 mg | ORAL_TABLET | Freq: Every day | ORAL | Status: DC
  Administered 2024-02-20 – 2024-02-22 (×3): 0.6 mg via ORAL
  Filled 2024-02-20 (×4): qty 1

## 2024-02-20 MED ORDER — BOOST / RESOURCE BREEZE PO LIQD CUSTOM
1.0000 | Freq: Three times a day (TID) | ORAL | Status: DC
  Administered 2024-02-20 (×2): 1 via ORAL

## 2024-02-20 NOTE — Progress Notes (Signed)
 PROGRESS NOTE    Edward Weber  FMW:[id number] DOB: 1960-05-22 DOA: 02/04/2024 PCP: [name], MD     Brief Narrative:   From admission h and p  Patient is a 63 years old male with past medical history of hypertension, stroke, history of sickle cell anemia, GERD polysubstance abuse presented to the hospital with complaints of dizziness and passing out spell while he was trying to walk to his bathroom.  He also had some shortness of breath and exertional chest pain and dyspnea on exertion for past few days.  Family was able to catch him and lowered him to the floor but patient complains of pain on the back.  There was report that recently his medications including gabapentin  and amlodipine were removed.  Patient states that he has been smoking cigarettes and he uses cocaine.  Denies any nausea vomiting or abdominal pain.  Denies any urinary urgency frequency or dysuria.  Denies any dizziness lightheadedness currently.  Denies any sick contacts or recent travel.   In the ED, patient had elevated blood pressure at 203/102.  Afebrile with a temperature of 98.1 F.  Labs were notable for normal WBC with hemoglobin of 11.5.  BMP with creatinine of 1.0.  BNP within normal range at 81.  COVID influenza and RSV was negative.  Chest x-ray showed no acute infiltrate.  CT angiogram of the chest showed no evidence of pulmonary embolism.  CT head scan was negative for acute findings.  Patient was then considered for admission to hospital for syncope.  11/8: Patient little more interactive and started eating.  Ammonia levels were normal.  11/9: Concern of left ankle gout flare so starting on colchicine .  Consulting dietitian for supplements.  Assessment & Plan:   Principal Problem:   Alcohol withdrawal with delirium (HCC) Active Problems:   Syncope   Chest pain   Alcohol abuse   Essential hypertension   GERD without esophagitis   History of CVA (cerebrovascular accident)   T2DM (type 2  diabetes mellitus) (HCC)   Chest pain with moderate risk for cardiac etiology  # Alcohol use disorder with complicated withdrawal Developed agitation and delirium day of admission. Per daughter heavy drinker with history of withdrawal, starts drinking first thing in the morning. Now no longer withdrawing - continue vitamins  # B12 deficiency Less than 150. Received a week of 1000 mcg IM daily - weekly b12  # Encephalopathy Seems slowly improving.  Etiology unclear. Serial MRIs have shown sequela of subacute infarcts but thought to be too small to explain presentation. EEG no seizure. Hiv neg. Tsh wnl. Repeat head CT. Rpr neg.  - neuro consulted 11/6,-repeat ammonia levels normal, repeat EEG done with pending results. - continue high dose thiamine  - have started lactulose given evidence cirrhosis on imaging, ammonia levels remain normal - Improving p.o. intake now. - Patient is on high-dose thiamine , likely secondary to [facility] which is high on differential.  # Acute respiratory failure Rapid response 10/26, intubated for airway protection, now extubated and on room air - monitor  # RUE DVT Presumed 2/2 PIV now removed - dr schnier advises 3 months anticoagulation - currently on treatment dose lovenox , will continue for now as we are still working up encephalopathy, likely will switch to Eliquis, once cleared from neurology.  # Subacute CVA Front parietal region, no sig findings on TTE, dopplers. Neuro has signed off - asa if off anticoagulation - statin when safe for it - neuro re-consulted  # Strep pneumo  pneumonia S/p treatment with ceftriaxone  # Syncope Suspect related to substance abuse. CT head nothing acute. EKG nothing acute  # Chest pain Negative w/u with serial troponins and echocardiogram and EKG. Resolved - monitor  # Substance abuse Alcohol, per h and p patient admitted to cocaine abuse as well. Uds positive for opioids and thc  # sickle cell anemia ruled  out Carries this diagnosis but electrophoresis here normal  # T2DM Appears to be a new diagnosis, A1c in the 8s. Glucose here mild elevation, CBG currently within goal -Continue with SSI - monitor  # HTN Labile BPs - home amlodipine   # Aortic dilation Incidental on CTA, 4 cm - outpt surveillance  # Alcohol liver disease Cirrhotic liver on CT but no laboratory evidence of such. Hcv rna neg (prior resolved infection) - start lactulose as above  # Debility - for snf  # History of gout with concern of left ankle flare. - Starting on colchicine  - Continue allopurinol   DVT prophylaxis: lovenox  Code Status: full Family Communication: Discussed with sister at bedside  Level of care: Progressive Status is: Inpatient  Consultants:  Psychiatry, neurology, pccm  Procedures: S/p intubation  Antimicrobials:  See above    Subjective: Patient was having bilateral leg pain more on left ankle.  Objective: Vitals:   02/20/24 0320 02/20/24 0500 02/20/24 0836 02/20/24 1136  BP: 129/74  116/70 111/66  Pulse: 79  79 76  Resp: (!) 21  18 20   Temp: 98.4 F (36.9 C)  98 F (36.7 C) 97.8 F (36.6 C)  TempSrc:   Axillary   SpO2: 98%  94% 96%  Weight:  82.9 kg    Height:        Intake/Output Summary (Last 24 hours) at 02/20/2024 1544 Last data filed at 02/20/2024 1300 Gross per 24 hour  Intake 302.5 ml  Output 700 ml  Net -397.5 ml   Filed Weights   02/18/24 0500 02/19/24 0500 02/20/24 0500  Weight: 80.5 kg 83 kg 82.9 kg    Examination:  General.  Chronically ill-appearing gentleman, in no acute distress. Pulmonary.  Lungs clear bilaterally, normal respiratory effort. CV.  Regular rate and rhythm, no JVD, rub or murmur. Abdomen.  Soft, nontender, nondistended, BS positive. CNS.  Alert and oriented .  No focal neurologic deficit. Extremities.  No edema,  pulses intact and symmetrical. Left ankle mild erythema and tenderness.   Data Reviewed: I have personally  reviewed following labs and imaging studies  CBC: Recent Labs  Lab 02/14/24 0519 02/15/24 0600 02/16/24 0441 02/17/24 1257 02/20/24 0358  WBC 13.4* 12.3* 14.8* 15.9* 18.2*  NEUTROABS  --   --   --  10.8*  --   HGB 9.7* 10.4* 9.6* 9.4* 9.1*  HCT 30.5* 33.5* 30.1* 29.6* 27.2*  MCV 95.6 98.0 95.3 96.1 92.8  PLT 420* 432* 469* 515* 549*   Basic Metabolic Panel: Recent Labs  Lab 02/14/24 0519 02/15/24 0600 02/16/24 0441 02/17/24 1257 02/18/24 1432 02/20/24 0358  NA 144 147* 143 138 135  --   K 4.4 4.8 4.6 4.3 4.2  --   CL 106 110 109 102 103  --   CO2 24 23 23 23  19*  --   GLUCOSE 143* 127* 147* 114* 116*  --   BUN 39* 42* 48* 48* 30*  --   CREATININE 1.09 1.22 1.35* 1.27* 1.16 0.98  CALCIUM  9.2 9.0 8.9 8.9 8.3*  --   MG 2.3 2.4 2.3  --   --   --  GFR: Estimated Creatinine Clearance: 78.9 mL/min (by C-G formula based on SCr of 0.98 mg/dL). Liver Function Tests: Recent Labs  Lab 02/17/24 1257 02/18/24 1432  AST 50* 57*  ALT 55* 59*  ALKPHOS 93 101  BILITOT 0.6 0.8  PROT 7.4 7.4  ALBUMIN 2.7* 2.6*   No results for input(s): LIPASE, AMYLASE in the last 168 hours. Recent Labs  Lab 02/18/24 1621  AMMONIA 27    Coagulation Profile: No results for input(s): INR, PROTIME in the last 168 hours. Cardiac Enzymes: No results for input(s): CKTOTAL, CKMB, CKMBINDEX, TROPONINI in the last 168 hours. BNP (last 3 results) No results for input(s): PROBNP in the last 8760 hours. HbA1C: No results for input(s): HGBA1C in the last 72 hours.  CBG: Recent Labs  Lab 02/19/24 2317 02/20/24 0434 02/20/24 0832 02/20/24 1137 02/20/24 1458  GLUCAP 238* 97 127* 133* 127*   Lipid Profile: No results for input(s): CHOL, HDL, LDLCALC, TRIG, CHOLHDL, LDLDIRECT in the last 72 hours. Thyroid Function Tests: No results for input(s): TSH, T4TOTAL, FREET4, T3FREE, THYROIDAB in the last 72 hours.  Anemia Panel: No results for input(s):  VITAMINB12, FOLATE, FERRITIN, TIBC, IRON, RETICCTPCT in the last 72 hours. Urine analysis:    Component Value Date/Time   COLORURINE YELLOW (A) 02/17/2024 1425   APPEARANCEUR HAZY (A) 02/17/2024 1425   APPEARANCEUR Clear 01/17/2020 1550   LABSPEC 1.020 02/17/2024 1425   LABSPEC 1.010 04/01/2014 1721   PHURINE 5.0 02/17/2024 1425   GLUCOSEU NEGATIVE 02/17/2024 1425   GLUCOSEU Negative 04/01/2014 1721   HGBUR NEGATIVE 02/17/2024 1425   BILIRUBINUR NEGATIVE 02/17/2024 1425   BILIRUBINUR Negative 01/17/2020 1550   BILIRUBINUR Negative 04/01/2014 1721   KETONESUR NEGATIVE 02/17/2024 1425   PROTEINUR NEGATIVE 02/17/2024 1425   NITRITE NEGATIVE 02/17/2024 1425   LEUKOCYTESUR NEGATIVE 02/17/2024 1425   LEUKOCYTESUR Negative 04/01/2014 1721   Sepsis Labs: @LABRCNTIP (procalcitonin:4,lacticidven:4)  ) Recent Results (from the past 240 hours)  Culture, blood (Routine X 2) w Reflex to ID Panel     Status: None (Preliminary result)   Collection Time: 02/17/24  4:41 PM   Specimen: BLOOD LEFT ARM  Result Value Ref Range Status   Specimen Description BLOOD LEFT ARM  Final   Special Requests   Final    BOTTLES DRAWN AEROBIC ONLY Blood Culture adequate volume   Culture   Final    NO GROWTH 3 DAYS Performed at [facility], [address] [place], [place] [address]    Report Status PENDING  Incomplete  Culture, blood (Routine X 2) w Reflex to ID Panel     Status: None (Preliminary result)   Collection Time: 02/17/24  4:42 PM   Specimen: BLOOD LEFT ARM  Result Value Ref Range Status   Specimen Description BLOOD LEFT ARM  Final   Special Requests   Final    BOTTLES DRAWN AEROBIC ONLY Blood Culture adequate volume   Culture   Final    NO GROWTH 3 DAYS Performed at [facility], [address] [place], [place] [address]    Report Status PENDING  Incomplete     Radiology Studies: No results found.  Scheduled Meds:  allopurinol   100 mg Oral Daily    amLODipine  10 mg Oral Daily   Chlorhexidine Gluconate Cloth  6 each Topical Daily   colchicine   0.6 mg Oral Daily   [START ON 02/21/2024] cyanocobalamin  1,000 mcg Intramuscular QODAY   Followed by   NOREEN ON 03/05/2024] cyanocobalamin  1,000 mcg  Intramuscular Weekly   enoxaparin  (LOVENOX ) injection  1 mg/kg Subcutaneous Q12H   feeding supplement  1 Container Oral TID BM   folic acid   1 mg Oral Daily   gabapentin   100 mg Oral BID   insulin aspart  0-15 Units Subcutaneous Q4H   insulin glargine-yfgn  10 Units Subcutaneous Daily   lactulose  20 g Oral BID   multivitamin with minerals  1 tablet Oral Daily   nicotine  21 mg Transdermal Daily   pantoprazole   40 mg Oral Daily   [START ON 02/22/2024] thiamine  (VITAMIN B1) injection  100 mg Intravenous Q24H   Continuous Infusions:  thiamine  (VITAMIN B1) injection 250 mg (02/19/24 2231)    LOS: 16 days   This record has been created using Conservation officer, historic buildings. Errors have been sought and corrected,but may not always be located. Such creation errors do not reflect on the standard of care.   Amaryllis Dare, MD [facility]   If 7PM-7AM, please contact night-coverage [email or web address] Password [facility] 02/20/2024, 3:44 PM

## 2024-02-20 NOTE — Plan of Care (Signed)
  Problem: Clinical Measurements: Goal: Will remain free from infection Outcome: Progressing Goal: Respiratory complications will improve Outcome: Progressing   Problem: Safety: Goal: Ability to remain free from injury will improve Outcome: Progressing   Problem: Role Relationship: Goal: Method of communication will improve Outcome: Progressing   Problem: Education: Goal: Knowledge of General Education information will improve Description: Including pain rating scale, medication(s)/side effects and non-pharmacologic comfort measures Outcome: Not Progressing   Problem: Health Behavior/Discharge Planning: Goal: Ability to manage health-related needs will improve Outcome: Not Progressing   Problem: Activity: Goal: Risk for activity intolerance will decrease Outcome: Not Progressing   Problem: Nutrition: Goal: Adequate nutrition will be maintained Outcome: Not Progressing   Problem: Coping: Goal: Level of anxiety will decrease Outcome: Not Progressing   Problem: Pain Managment: Goal: General experience of comfort will improve and/or be controlled Outcome: Not Progressing   Problem: Skin Integrity: Goal: Risk for impaired skin integrity will decrease Outcome: Not Progressing   Problem: Fluid Volume: Goal: Ability to maintain a balanced intake and output will improve Outcome: Not Progressing   Problem: Health Behavior/Discharge Planning: Goal: Ability to manage health-related needs will improve Outcome: Not Progressing   Problem: Skin Integrity: Goal: Risk for impaired skin integrity will decrease Outcome: Not Progressing   Problem: Activity: Goal: Ability to tolerate increased activity will improve Outcome: Not Progressing

## 2024-02-20 NOTE — Plan of Care (Signed)
  Problem: Clinical Measurements: Goal: Ability to maintain clinical measurements within normal limits will improve Outcome: Progressing Goal: Will remain free from infection Outcome: Progressing Goal: Diagnostic test results will improve Outcome: Progressing Goal: Cardiovascular complication will be avoided Outcome: Progressing   Problem: Nutrition: Goal: Adequate nutrition will be maintained Outcome: Progressing   Problem: Elimination: Goal: Will not experience complications related to bowel motility Outcome: Progressing

## 2024-02-21 DIAGNOSIS — I10 Essential (primary) hypertension: Secondary | ICD-10-CM | POA: Diagnosis not present

## 2024-02-21 DIAGNOSIS — R569 Unspecified convulsions: Secondary | ICD-10-CM | POA: Diagnosis not present

## 2024-02-21 DIAGNOSIS — R4182 Altered mental status, unspecified: Secondary | ICD-10-CM | POA: Diagnosis not present

## 2024-02-21 DIAGNOSIS — R55 Syncope and collapse: Secondary | ICD-10-CM | POA: Diagnosis not present

## 2024-02-21 DIAGNOSIS — R079 Chest pain, unspecified: Secondary | ICD-10-CM | POA: Diagnosis not present

## 2024-02-21 DIAGNOSIS — F1010 Alcohol abuse, uncomplicated: Secondary | ICD-10-CM | POA: Diagnosis not present

## 2024-02-21 LAB — CBC
HCT: 28.8 % — ABNORMAL LOW (ref 39.0–52.0)
Hemoglobin: 9.2 g/dL — ABNORMAL LOW (ref 13.0–17.0)
MCH: 30.6 pg (ref 26.0–34.0)
MCHC: 31.9 g/dL (ref 30.0–36.0)
MCV: 95.7 fL (ref 80.0–100.0)
Platelets: 572 K/uL — ABNORMAL HIGH (ref 150–400)
RBC: 3.01 MIL/uL — ABNORMAL LOW (ref 4.22–5.81)
RDW: 14 % (ref 11.5–15.5)
WBC: 13.3 K/uL — ABNORMAL HIGH (ref 4.0–10.5)
nRBC: 0.2 % (ref 0.0–0.2)

## 2024-02-21 LAB — GLUCOSE, CAPILLARY
Glucose-Capillary: 101 mg/dL — ABNORMAL HIGH (ref 70–99)
Glucose-Capillary: 101 mg/dL — ABNORMAL HIGH (ref 70–99)
Glucose-Capillary: 118 mg/dL — ABNORMAL HIGH (ref 70–99)
Glucose-Capillary: 121 mg/dL — ABNORMAL HIGH (ref 70–99)
Glucose-Capillary: 129 mg/dL — ABNORMAL HIGH (ref 70–99)

## 2024-02-21 LAB — CREATININE, SERUM
Creatinine, Ser: 0.82 mg/dL (ref 0.61–1.24)
GFR, Estimated: >60 mL/min (ref >60)

## 2024-02-21 MED ORDER — THIAMINE HCL 100 MG/ML IJ SOLN
250.0000 mg | INTRAVENOUS | Status: AC
  Administered 2024-02-21: 250 mg via INTRAVENOUS
  Filled 2024-02-21: qty 2.5

## 2024-02-21 MED ORDER — THIAMINE HCL 100 MG/ML IJ SOLN: 100.0000 mg | INTRAMUSCULAR | Status: DC

## 2024-02-21 MED ORDER — ENOXAPARIN SODIUM 80 MG/0.8ML IJ SOSY
1.0000 mg/kg | PREFILLED_SYRINGE | Freq: Two times a day (BID) | INTRAMUSCULAR | Status: DC
  Administered 2024-02-21 – 2024-02-22 (×3): 75 mg via SUBCUTANEOUS
  Filled 2024-02-21 (×3): qty 0.8

## 2024-02-21 MED ORDER — KATE FARMS STANDARD 1.4 EN LIQD
325.0000 mL | Freq: Three times a day (TID) | ENTERAL | Status: DC
  Administered 2024-02-21 – 2024-02-22 (×3): 325 mL via ORAL
  Filled 2024-02-21: qty 325

## 2024-02-21 NOTE — Progress Notes (Signed)
 PROGRESS NOTE    Edward Weber  FMW:[id number] DOB: 12/06/1960 DOA: 02/04/2024 PCP: [name], MD     Brief Narrative:   From admission h and p  Patient is a 63 years old male with past medical history of hypertension, stroke, history of sickle cell anemia, GERD polysubstance abuse presented to the hospital with complaints of dizziness and passing out spell while he was trying to walk to his bathroom.  He also had some shortness of breath and exertional chest pain and dyspnea on exertion for past few days.  Family was able to catch him and lowered him to the floor but patient complains of pain on the back.  There was report that recently his medications including gabapentin  and amlodipine were removed.  Patient states that he has been smoking cigarettes and he uses cocaine.  Denies any nausea vomiting or abdominal pain.  Denies any urinary urgency frequency or dysuria.  Denies any dizziness lightheadedness currently.  Denies any sick contacts or recent travel.   In the ED, patient had elevated blood pressure at 203/102.  Afebrile with a temperature of 98.1 F.  Labs were notable for normal WBC with hemoglobin of 11.5.  BMP with creatinine of 1.0.  BNP within normal range at 81.  COVID influenza and RSV was negative.  Chest x-ray showed no acute infiltrate.  CT angiogram of the chest showed no evidence of pulmonary embolism.  CT head scan was negative for acute findings.  Patient was then considered for admission to hospital for syncope.  11/8: Patient little more interactive and started eating.  Ammonia levels were normal.  11/9: Concern of left ankle gout flare so starting on colchicine .  Consulting dietitian for supplements.  11/10: Remained hemodynamically stable.  Still not enough p.o. intake, need to take his supplements.  Had a bed offer at [facility] authorization.  Assessment & Plan:   Principal Problem:   Alcohol withdrawal with delirium  (HCC) Active Problems:   Syncope   Chest pain   Alcohol abuse   Essential hypertension   GERD without esophagitis   History of CVA (cerebrovascular accident)   T2DM (type 2 diabetes mellitus) (HCC)   Chest pain with moderate risk for cardiac etiology  # Alcohol use disorder with complicated withdrawal Developed agitation and delirium day of admission. Per daughter heavy drinker with history of withdrawal, starts drinking first thing in the morning. Now no longer withdrawing - continue vitamins  # B12 deficiency Less than 150. Received a week of 1000 mcg IM daily - weekly b12  # Encephalopathy Seems slowly improving.  Etiology unclear. Serial MRIs have shown sequela of subacute infarcts but thought to be too small to explain presentation. EEG no seizure. Hiv neg. Tsh wnl. Repeat head CT. Rpr neg.  - neuro consulted 11/6,-repeat ammonia levels normal, repeat EEG with no seizures. - Completed high dose thiamine  - have started lactulose given evidence cirrhosis on imaging, ammonia levels remain normal - Improving p.o. intake now. - Patient is on high-dose thiamine , likely secondary to [facility] which is high on differential.  # Acute respiratory failure Rapid response 10/26, intubated for airway protection, now extubated and on room air - monitor  # RUE DVT Presumed 2/2 PIV now removed - dr schnier advises 3 months anticoagulation - currently on treatment dose lovenox , will continue for now as we are still working up encephalopathy, likely will switch to Eliquis, once cleared from neurology.  # Subacute CVA Front parietal region, no sig findings  on TTE, dopplers. Neuro has signed off - asa if off anticoagulation - statin when safe for it - neuro re-consulted  # Strep pneumo pneumonia S/p treatment with ceftriaxone  # Syncope Suspect related to substance abuse. CT head nothing acute. EKG nothing acute  # Chest pain Negative w/u with serial troponins and echocardiogram and  EKG. Resolved - monitor  # Substance abuse Alcohol, per h and p patient admitted to cocaine abuse as well. Uds positive for opioids and thc  # sickle cell anemia ruled out Carries this diagnosis but electrophoresis here normal  # T2DM Appears to be a new diagnosis, A1c in the 8s. Glucose here mild elevation, CBG currently within goal -Continue with SSI - monitor  # HTN Labile BPs - home amlodipine   # Aortic dilation Incidental on CTA, 4 cm - outpt surveillance  # Alcohol liver disease Cirrhotic liver on CT but no laboratory evidence of such. Hcv rna neg (prior resolved infection) - start lactulose as above  # Debility - for snf  # History of gout with concern of left ankle flare. - Continue with colchicine  - Continue allopurinol   DVT prophylaxis: lovenox  Code Status: full Family Communication: Discussed with sister at bedside  Level of care: Progressive Status is: Inpatient  Consultants:  Psychiatry, neurology, pccm  Procedures: S/p intubation  Antimicrobials:  See above    Subjective: Patient was seen and examined today.  No new concern.  Objective: Vitals:   02/21/24 0353 02/21/24 0501 02/21/24 0736 02/21/24 1152  BP: 136/76  (!) 129/90 118/79  Pulse: 74  80 86  Resp: 18  18 16   Temp: 97.8 F (36.6 C)  98.3 F (36.8 C) 98.9 F (37.2 C)  TempSrc:   Oral Oral  SpO2: 96%  100% 97%  Weight:  74.8 kg    Height:        Intake/Output Summary (Last 24 hours) at 02/21/2024 1615 Last data filed at 02/21/2024 1412 Gross per 24 hour  Intake 772.5 ml  Output 600 ml  Net 172.5 ml   Filed Weights   02/19/24 0500 02/20/24 0500 02/21/24 0501  Weight: 83 kg 82.9 kg 74.8 kg    Examination:  General. Frail gentleman, in no acute distress. Pulmonary.  Lungs clear bilaterally, normal respiratory effort. CV.  Regular rate and rhythm, no JVD, rub or murmur. Abdomen.  Soft, nontender, nondistended, BS positive. CNS.  Alert and oriented .  No focal  neurologic deficit. Extremities.  No edema, no cyanosis, pulses intact and symmetrical.    Data Reviewed: I have personally reviewed following labs and imaging studies  CBC: Recent Labs  Lab 02/15/24 0600 02/16/24 0441 02/17/24 1257 02/20/24 0358 02/21/24 0327  WBC 12.3* 14.8* 15.9* 18.2* 13.3*  NEUTROABS  --   --  10.8*  --   --   HGB 10.4* 9.6* 9.4* 9.1* 9.2*  HCT 33.5* 30.1* 29.6* 27.2* 28.8*  MCV 98.0 95.3 96.1 92.8 95.7  PLT 432* 469* 515* 549* 572*   Basic Metabolic Panel: Recent Labs  Lab 02/15/24 0600 02/16/24 0441 02/17/24 1257 02/18/24 1432 02/20/24 0358 02/21/24 0327  NA 147* 143 138 135  --   --   K 4.8 4.6 4.3 4.2  --   --   CL 110 109 102 103  --   --   CO2 23 23 23  19*  --   --   GLUCOSE 127* 147* 114* 116*  --   --   BUN 42* 48* 48* 30*  --   --  CREATININE 1.22 1.35* 1.27* 1.16 0.98 0.82  CALCIUM  9.0 8.9 8.9 8.3*  --   --   MG 2.4 2.3  --   --   --   --    GFR: Estimated Creatinine Clearance: 86.9 mL/min (by C-G formula based on SCr of 0.82 mg/dL). Liver Function Tests: Recent Labs  Lab 02/17/24 1257 02/18/24 1432  AST 50* 57*  ALT 55* 59*  ALKPHOS 93 101  BILITOT 0.6 0.8  PROT 7.4 7.4  ALBUMIN 2.7* 2.6*   No results for input(s): LIPASE, AMYLASE in the last 168 hours. Recent Labs  Lab 02/18/24 1621  AMMONIA 27    Coagulation Profile: No results for input(s): INR, PROTIME in the last 168 hours. Cardiac Enzymes: No results for input(s): CKTOTAL, CKMB, CKMBINDEX, TROPONINI in the last 168 hours. BNP (last 3 results) No results for input(s): PROBNP in the last 8760 hours. HbA1C: No results for input(s): HGBA1C in the last 72 hours.  CBG: Recent Labs  Lab 02/20/24 2121 02/21/24 0041 02/21/24 0451 02/21/24 0732 02/21/24 1154  GLUCAP 142* 101* 118* 121* 101*   Lipid Profile: No results for input(s): CHOL, HDL, LDLCALC, TRIG, CHOLHDL, LDLDIRECT in the last 72 hours. Thyroid Function Tests: No  results for input(s): TSH, T4TOTAL, FREET4, T3FREE, THYROIDAB in the last 72 hours.  Anemia Panel: No results for input(s): VITAMINB12, FOLATE, FERRITIN, TIBC, IRON, RETICCTPCT in the last 72 hours. Urine analysis:    Component Value Date/Time   COLORURINE YELLOW (A) 02/17/2024 1425   APPEARANCEUR HAZY (A) 02/17/2024 1425   APPEARANCEUR Clear 01/17/2020 1550   LABSPEC 1.020 02/17/2024 1425   LABSPEC 1.010 04/01/2014 1721   PHURINE 5.0 02/17/2024 1425   GLUCOSEU NEGATIVE 02/17/2024 1425   GLUCOSEU Negative 04/01/2014 1721   HGBUR NEGATIVE 02/17/2024 1425   BILIRUBINUR NEGATIVE 02/17/2024 1425   BILIRUBINUR Negative 01/17/2020 1550   BILIRUBINUR Negative 04/01/2014 1721   KETONESUR NEGATIVE 02/17/2024 1425   PROTEINUR NEGATIVE 02/17/2024 1425   NITRITE NEGATIVE 02/17/2024 1425   LEUKOCYTESUR NEGATIVE 02/17/2024 1425   LEUKOCYTESUR Negative 04/01/2014 1721   Sepsis Labs: @LABRCNTIP (procalcitonin:4,lacticidven:4)  ) Recent Results (from the past 240 hours)  Culture, blood (Routine X 2) w Reflex to ID Panel     Status: None (Preliminary result)   Collection Time: 02/17/24  4:41 PM   Specimen: BLOOD LEFT ARM  Result Value Ref Range Status   Specimen Description BLOOD LEFT ARM  Final   Special Requests   Final    BOTTLES DRAWN AEROBIC ONLY Blood Culture adequate volume   Culture   Final    NO GROWTH 4 DAYS Performed at [facility], [address] [place], [place] [address]    Report Status PENDING  Incomplete  Culture, blood (Routine X 2) w Reflex to ID Panel     Status: None (Preliminary result)   Collection Time: 02/17/24  4:42 PM   Specimen: BLOOD LEFT ARM  Result Value Ref Range Status   Specimen Description BLOOD LEFT ARM  Final   Special Requests   Final    BOTTLES DRAWN AEROBIC ONLY Blood Culture adequate volume   Culture   Final    NO GROWTH 4 DAYS Performed at [facility], [address], [place], [place] [address]     Report Status PENDING  Incomplete     Radiology Studies: No results found.  Scheduled Meds:  allopurinol   100 mg Oral Daily   amLODipine  10 mg Oral Daily   Chlorhexidine Gluconate Cloth  6 each Topical Daily   colchicine   0.6 mg Oral Daily   cyanocobalamin  1,000 mcg Intramuscular QODAY   Followed by   NOREEN ON 03/05/2024] cyanocobalamin  1,000 mcg Intramuscular Weekly   enoxaparin  (LOVENOX ) injection  1 mg/kg Subcutaneous Q12H   feeding supplement ([employer] STANDARD ENT 1.4)  325 mL Oral TID BM   folic acid   1 mg Oral Daily   gabapentin   100 mg Oral BID   insulin aspart  0-15 Units Subcutaneous Q4H   insulin glargine-yfgn  10 Units Subcutaneous Daily   lactulose  20 g Oral BID   multivitamin with minerals  1 tablet Oral Daily   nicotine  21 mg Transdermal Daily   pantoprazole   40 mg Oral Daily   [START ON 02/22/2024] thiamine  (VITAMIN B1) injection  100 mg Intravenous Q24H   Continuous Infusions:  thiamine  (VITAMIN B1) injection      LOS: 17 days   This record has been created using Conservation officer, historic buildings. Errors have been sought and corrected,but may not always be located. Such creation errors do not reflect on the standard of care.   Amaryllis Dare, MD [facility]   If 7PM-7AM, please contact night-coverage [email or web address] Password [email or web address] 02/21/2024, 4:15 PM

## 2024-02-21 NOTE — Progress Notes (Addendum)
 Nutrition Follow-up  DOCUMENTATION CODES:   Not applicable  INTERVENTION:   -Continue dysphagia 1 diet with thin liquids; RD will follow for diet advancement and adjust supplement regimen as appropriate -Continue MVI with minerals daily -Continue high dose thiamine  -Continue 10000 mcg cyancobalamin daily -Continue B-complex with C x 30 days -Continue daily weights -Continue feeding assistance with meals -[employer] 1.4 PO TID, each supplement provides 455 kcal and 20 grams protein.  -D/c Boost Breeze -If NGT is placed, recommend:    Initiate [employer] @ 20 ml/hr and increase by 10 ml every 12 hours to goal rate of 70 ml/hr.    30 ml free water flush every 4 hours   Tube feeding regimen provides 2352 kcal (100% of needs), 104 grams of protein, and 1193 ml of H2O. Total free water: 1373 ml daily   -If feedings are started: monitor Mg, K, and Phos and replete as needed and 100 mg thiamine  daily x 7 days  -Discussed recommendations for NGT with RN, MD, and SLP; SLP also going to re-evaluate today for possible diet upgrade   NUTRITION DIAGNOSIS:   Inadequate oral intake related to inability to eat (pt sedated and ventilated) as evidenced by NPO status.  Progressing; advanced to PO diet on 02/15/24   GOAL:   Patient will meet greater than or equal to 90% of their needs  Progressing   MONITOR:   PO intake, Supplement acceptance, Labs, Weight trends, I & O's, Skin  REASON FOR ASSESSMENT:   Ventilator    ASSESSMENT:   63 y/o male with h/o etoh and substance abuse, milk allergy, HTN, GERD, Schatzki's ring, hiatal hernia, CVA with residual hemiplegia, DM, neuropathy, prostate cancer, HCV, sickle cell anemia, ICH, depression, CKD III, gout, H. pylori and spinal stenosis who is admitted with AMS, syncope, aspiration PNA, sepsis, new sub-acute infarcts and AKI.  11/6- s/p BSE- dysphagia 1 diet with thin liquids  11/10- s/p BSE- dysphagia 2 diet with thin liquids  Reviewed  I/O's: -417 ml x 24 hours and -1.3 L since 02/07/24  UOP: 1.6 L x 24 hours  Pt sitting up in bed at time of visit. He is drinking water, but struggling to keep it in his hand. Patient reports he is eating and drinking, but noted multiple cans of juice and some applesauce on bedside table. Patient reports he only wants to eat applesauce for breakfast. RD assisted patient with drinking water and provided him a fresh cup per his request. Noted meal completions 0%.   Per neurology notes, suspect delirium is superimposed on Wernicke's encephalopathy.   Case discussed with RN and MD regarding poor nutritional intake and recommendation for NGT. SLP advanced to dysphagia 2 diet; hopeful that intake will improve with diet advancement.   Plan for SNF placement at discharge.   Medications reviewed and include colchicine , vitamin B-12, lovenox , folic acid , neurontin , lactulose, protonix , and thiamine .   Labs reviewed: CBGS: 97-133 (inpatient orders for glycemic control are 0-15 units insulin aspart every 4 hours and 10 units insulin glargine-yfgn daily).     Diet Order:   Diet Order             DIET - DYS 1 Fluid consistency: Thin  Diet effective now                   EDUCATION NEEDS:   No education needs have been identified at this time  Skin:  Skin Assessment: Reviewed RN Assessment  Last BM:  02/20/24 (type  6)  Height:   Ht Readings from Last 1 Encounters:  02/04/24 5' 8 (1.727 m)    Weight:   Wt Readings from Last 1 Encounters:  02/21/24 74.8 kg    Ideal Body Weight:  70 kg  BMI:  Body mass index is 25.07 kg/m.  Estimated Nutritional Needs:   Kcal:  2000-2300kcal/day  Protein:  100-115g/day  Fluid:  1.8-2.1L/day    Margery ORN, RD, LDN, CDCES Registered Dietitian III Certified Diabetes Care and Education Specialist If unable to reach this RD, please use RD Inpatient group chat on secure chat between hours of 8am-4 pm daily

## 2024-02-21 NOTE — Progress Notes (Signed)
 Additional investigations recommended in last progress note unrevealing. Suspect delirium superimposed on Wernicke. Continue current interventions. Neurology will be available prn for questions going forward.  Elida Ross, MD [facility] [phone]  If 7pm- 7am, please page neurology on call as listed in AMION.

## 2024-02-21 NOTE — Progress Notes (Signed)
 Speech Language Pathology Treatment: Dysphagia  Patient Details Name: Edward Weber MRN: [id number] DOB: September 23, 1960 Today's Date: 02/21/2024 Time: 1110-1130 SLP Time Calculation (min) (ACUTE ONLY): 20 min  Assessment / Plan / Recommendation Clinical Impression  Pt seen for dysphagia intervention targeting education and trials of advanced solids. Per chart review, WBC trending down, with report of increased PO intake over weekend. Upon therapist approach, pt alert, speaking with sister, on room air. Education shared with sister regarding plan to trial advanced solids and potential for diet advancement, sister reported understanding. Trials completed of thin liquids and regular solids. Liquid trials completed via straw, with noted continued intermittent impulsivity with intake. Min increased time for mastication and clearance for regular solids- intermittent verbal cues provided for redirection to task/oral cavity.   Overall, mentation/attention to task has improved with continued oral phase deficits (slow manipulation, clearance noted). Recommend advancement to chopped solids and thin liquids with continued aspiration precautions (slow rate, small bites, elevated HOB, and alert for PO  intake). Supervision for PO intake (monitor for impulsivity with straw sips). Encourage pt to hold cup, attempt self feeding. Medications whole vs crushed in puree. SLP will continue to follow to determine continued safety with PO intake. Dietician, MD, and RN aware of recommendations.    HPI HPI: Per critical care progress note, 63 y.o male with significant PMH/Current of HTN, CVA, sickle cell anemia, GERD, polysubstance abuse (EtOH abuse disorder, cocaine, marijuana tobacco abuse), Neuromuscular disorder, and recurrent syncope who presented to the ED with chief complaints of syncopal episode.  Per chart review, he has been having exertional chest pain, DOP for the past few days and today felt dizzy and passed out  without hitting his head..  Pt was transferred to CCU and intubated for airway protection on 10/26; extubaed on 11/2 when calmer State.   MRI 11/1:  No acute infarct. No acute intracranial hemorrhage. No mass  or abnormal enhancement. No midline shift. No hydrocephalus. Linear focus of diffusion-weighted hyperintensity in the left centrum semiovale without corresponding ADC reduction, possibly late subacute ischemia.  Previous Head CT revealed: advanced chronic small vessel disease.   CXR at admit: no acute findings.   OF NOTE: Psychiatry was consulted for agitation and threatening to leave AMA. Due to history of polysubstance abuse and AMS; patient was IVCd.      SLP Plan  Continue with current plan of care          Recommendations  Diet recommendations: Dysphagia 2 (fine chop);Thin liquid Liquids provided via: Cup;Straw (pinched) Medication Administration: Crushed with puree Supervision: Staff to assist with self feeding;Full supervision/cueing for compensatory strategies Compensations: Minimize environmental distractions;Slow rate;Small sips/bites;Lingual sweep for clearance of pocketing;Follow solids with liquid Postural Changes and/or Swallow Maneuvers: Out of bed for meals;Seated upright 90 degrees;Upright 30-60 min after meal (reflux precautions)                  Oral care BID;Oral care before and after PO;Staff/trained caregiver to provide oral care   Frequent or constant Supervision/Assistance Dysphagia, unspecified (R13.10) (in setting of agitation requring intubation/extubation; unknown Cognitive functioning PTA; poor/missing Most Dentition; need for Full Feeding Assistance; GERD)     Continue with current plan of care    Edward Howington Clapp, MS, CCC-SLP Speech Language Pathologist Rehab Services; [facility] [phone] (ascom)   Edward Weber  02/21/2024, 1:15 PM

## 2024-02-21 NOTE — Procedures (Signed)
 Patient Name: Edward Weber  MRN: [id number]  Epilepsy Attending: Arlin MALVA Krebs  Referring Physician/Provider: Matthews Elida HERO, MD  Date: 02/18/2024 Duration: 27.35 mins  Patient history: 63 year old male with altered mental status.  EEG evaluate for seizure.  Level of alertness: Awake  AEDs during EEG study: GBP  Technical aspects: This EEG study was done with scalp electrodes positioned according to the 10-20 International system of electrode placement. Electrical activity was reviewed with band pass filter of 1-70Hz , sensitivity of 7 uV/mm, display speed of 49mm/sec with a 60Hz  notched filter applied as appropriate. EEG data were recorded continuously and digitally stored.  Video monitoring was available and reviewed as appropriate.  Description: The posterior dominant rhythm consists of 8Hz  activity of moderate voltage (25-35 uV) seen predominantly in posterior head regions, symmetric and reactive to eye opening and eye closing.  EEG showed intermittent generalized 3 to 6 Hz theta-delta slowing.  Hyperventilation and photic stimulation were not performed.     Of note, study was technically difficult due to significant movement artifact.  ABNORMALITY - Intermittent slow, generalized  IMPRESSION: This technically difficult study is suggestive of generalized cerebral dysfunction (encephalopathy). No seizures or epileptiform discharges were seen throughout the recording.   Edward Weber

## 2024-02-21 NOTE — Plan of Care (Signed)

## 2024-02-21 NOTE — TOC Progression Note (Addendum)
 Transition of Care [facility]) - Progression Note    Patient Details  Name: Edward Weber MRN: [id number] Date of Birth: 1960/04/25  [facility] CM/SW Contact  Shasta DELENA Daring, RN Phone Number: 02/21/2024, 9:04 AM  Clinical Narrative:      RNCM sent updated therapy notes to centers that have not responded to try to elicit more options.   10:52 AM [facility] is considering patient. Left VM for admissions coord to see how soon a bed would be available if daughter is interested.   3:44 PM Still no response from [facility]. Spoke with patient's daughter and sister and they want to go ahead with placement at [facility]. RNCM advised we will proceed and will be in touch as discharge plans develop.  4:08 PM Confirmed with Massie at [facility] care that a bed will be available tomorrow. Insurance authorization initiated.   4:29 PM Auth is approved. Auth number hasn't generated yet. Reference # [id number]. Valid 11/11-11/13.               Expected Discharge Plan and Services                                               Social Drivers of Health (SDOH) Interventions SDOH Screenings   Food Insecurity: Patient Unable To Answer (02/05/2024)  Housing: Patient Unable To Answer (02/05/2024)  Transportation Needs: Patient Unable To Answer (02/05/2024)  Utilities: Patient Unable To Answer (02/05/2024)  Financial Resource Strain: Medium Risk (05/04/2023)   Received from [facility]  Social Connections: Patient Unable To Answer (02/05/2024)  Tobacco Use: High Risk (02/04/2024)    Readmission Risk Interventions     No data to display

## 2024-02-21 NOTE — Progress Notes (Signed)
 Physical Therapy Treatment Patient Details Name: Edward Weber MRN: [id number] DOB: January 30, 1961 Today's Date: 02/21/2024   History of Present Illness 63 y.o male with significant PMH of HTN, CVA, sickle cell anemia, GERD, polysubstance abuse (EtOH abuse disorder, cocaine, marijuana tobacco abuse), and recurrent syncope who presented to the ED with chief complaints of syncopal episode. Per chart review, he has been having exertional chest pain, DOP for the past few days and today felt dizzy and passed out without hitting his head. Pt transferred to ICU on 10/26. MRI imaging from 11/01 revealing possible subacute ischemia in left centrum semiovale. Pt recently extubated on 10/30. [name]    PT Comments  Patient is agreeable to PT session. He is more interactive today than prior sessions. Patient was able to activate movement in all extremities with left side weaker than right side. He continues to need significant assistance with bed mobility. Sitting balance is poor with assistance required to maintain midline. Lateral scooting transfers performed with +2 person assistance required. Continue to recommend rehabilitation < 3 hours/day after this hospital stay.    If plan is discharge home, recommend the following: Two people to help with walking and/or transfers;Two people to help with bathing/dressing/bathroom;Direct supervision/assist for medications management;Direct supervision/assist for financial management;Assist for transportation;Help with stairs or ramp for entrance;Supervision due to cognitive status   Can travel by private vehicle     No  Equipment Recommendations   (TBD at next level of care)    Recommendations for Other Services       Precautions / Restrictions Precautions Precautions: Fall Recall of Precautions/Restrictions: Impaired Restrictions Weight Bearing Restrictions Per Provider Order: No     Mobility  Bed Mobility Overal bed mobility: Needs Assistance Bed Mobility:  Supine to Sit, Sit to Supine     Supine to sit: Total assist, +2 for physical assistance Sit to supine: Max assist, +2 for physical assistance   General bed mobility comments: cues for task initiation and sequencing    Transfers Overall transfer level: Needs assistance                Lateral/Scoot Transfers: Max assist, +2 physical assistance General transfer comment: incremental scooting performed x 3 bouts. cues and faciliation for foot placement, hand placement, anterior weight shifting, and technique for increased independence. activity tolerance limited by fatigue and weakness    Ambulation/Gait               General Gait Details: unable to at this time   Stairs             Wheelchair Mobility     Tilt Bed    Modified Rankin (Stroke Patients Only)       Balance Overall balance assessment: Needs assistance Sitting-balance support: Feet supported Sitting balance-Leahy Scale: Poor Sitting balance - Comments: at least MinA- Mod A required. faciliation for midline with cues to maintain sitting balance/posture. left and posterior lean Postural control: Posterior lean (and left lateral lean)                                  Communication Communication Communication: Impaired Factors Affecting Communication: Reduced clarity of speech  Cognition Arousal: Alert Behavior During Therapy: Flat affect   PT - Cognitive impairments: Initiation, Sequencing Difficult to assess due to: Impaired communication                       Following  commands: Impaired Following commands impaired: Follows one step commands with increased time    Cueing Cueing Techniques: Verbal cues, Gestural cues, Tactile cues, Visual cues  Exercises General Exercises - Lower Extremity Long Arc Quad: AAROM, Strengthening, Both, 5 reps, Seated Other Exercises Other Exercises: cues for initiation for exercises. increased active assistance required on left  side but right side is also weak requiring facilitation assistance    General Comments General comments (skin integrity, edema, etc.): patient demonstrated active movement in all extremities today on command. left side is weaker than right side      Pertinent Vitals/Pain Pain Assessment Pain Assessment: No/denies pain    Home Living                          Prior Function            PT Goals (current goals can now be found in the care plan section) Acute Rehab PT Goals Patient Stated Goal: none stated PT Goal Formulation: With patient Time For Goal Achievement: 02/28/24 Potential to Achieve Goals: Fair Progress towards PT goals: Progressing toward goals    Frequency    Min 2X/week      PT Plan      Co-evaluation              AM-PAC PT 6 Clicks Mobility   Outcome Measure  Help needed turning from your back to your side while in a flat bed without using bedrails?: A Lot Help needed moving from lying on your back to sitting on the side of a flat bed without using bedrails?: Total Help needed moving to and from a bed to a chair (including a wheelchair)?: Total Help needed standing up from a chair using your arms (e.g., wheelchair or bedside chair)?: Total Help needed to walk in hospital room?: Total Help needed climbing 3-5 steps with a railing? : Total 6 Click Score: 7    End of Session   Activity Tolerance: Patient tolerated treatment well Patient left: in bed;with call bell/phone within reach;with bed alarm set Nurse Communication: Mobility status PT Visit Diagnosis: Muscle weakness (generalized) (M62.81);Unsteadiness on feet (R26.81);Other abnormalities of gait and mobility (R26.89);Difficulty in walking, not elsewhere classified (R26.2)     Time: 9071-9057 PT Time Calculation (min) (ACUTE ONLY): 14 min  Charges:    $Therapeutic Activity: 8-22 mins PT General Charges $$ ACUTE PT VISIT: 1 Visit                     Randine Essex, PT,  MPT    Randine LULLA Essex 02/21/2024, 11:03 AM

## 2024-02-21 NOTE — Progress Notes (Signed)
 OT Cancellation Note  Patient Details Name: Edward Weber MRN: [id number] DOB: 1960/09/03   Cancelled Treatment:    Reason Eval/Treat Not Completed: Patient declined, no reason specified. Second attempt to see pt for skilled OT treat this date. Pt and RN report pt fatigued from previous PT session and recent BM at bed level. Author noted pt was to barley able to keep his eyes open during conversation. Pt agreed to attempt OT therapy session on next available date/time.   Larraine Colas M.S. OTR/L  02/21/24, 3:14 PM

## 2024-02-22 DIAGNOSIS — I10 Essential (primary) hypertension: Secondary | ICD-10-CM | POA: Diagnosis not present

## 2024-02-22 DIAGNOSIS — R55 Syncope and collapse: Secondary | ICD-10-CM | POA: Diagnosis not present

## 2024-02-22 DIAGNOSIS — R079 Chest pain, unspecified: Secondary | ICD-10-CM | POA: Diagnosis not present

## 2024-02-22 DIAGNOSIS — F1010 Alcohol abuse, uncomplicated: Secondary | ICD-10-CM | POA: Diagnosis not present

## 2024-02-22 LAB — CULTURE, BLOOD (ROUTINE X 2)
Culture: NO GROWTH
Culture: NO GROWTH
Special Requests: ADEQUATE
Special Requests: ADEQUATE

## 2024-02-22 LAB — GLUCOSE, CAPILLARY
Glucose-Capillary: 109 mg/dL — ABNORMAL HIGH (ref 70–99)
Glucose-Capillary: 119 mg/dL — ABNORMAL HIGH (ref 70–99)
Glucose-Capillary: 120 mg/dL — ABNORMAL HIGH (ref 70–99)
Glucose-Capillary: 147 mg/dL — ABNORMAL HIGH (ref 70–99)

## 2024-02-22 MED ORDER — ADULT MULTIVITAMIN W/MINERALS CH: 1.0000 | ORAL_TABLET | Freq: Every day | ORAL | Status: AC

## 2024-02-22 MED ORDER — KATE FARMS STANDARD 1.4 EN LIQD: 325.0000 mL | Freq: Three times a day (TID) | ENTERAL | Status: AC

## 2024-02-22 MED ORDER — APIXABAN 5 MG PO TABS
5.0000 mg | ORAL_TABLET | Freq: Two times a day (BID) | ORAL | Status: AC
Start: 2024-02-22 — End: ?

## 2024-02-22 MED ORDER — LACTULOSE 10 GM/15ML PO SOLN: 20.0000 g | Freq: Two times a day (BID) | ORAL | Status: AC

## 2024-02-22 MED ORDER — AMLODIPINE BESYLATE 10 MG PO TABS: 10.0000 mg | ORAL_TABLET | Freq: Every day | ORAL | Status: AC

## 2024-02-22 MED ORDER — CYANOCOBALAMIN 1000 MCG/ML IJ SOLN: 1000.0000 ug | INTRAMUSCULAR | Status: AC

## 2024-02-22 MED ORDER — ALLOPURINOL 100 MG PO TABS: 100.0000 mg | ORAL_TABLET | Freq: Every day | ORAL | Status: AC

## 2024-02-22 MED ORDER — GABAPENTIN 100 MG PO CAPS: 100.0000 mg | ORAL_CAPSULE | Freq: Two times a day (BID) | ORAL | Status: AC

## 2024-02-22 MED ORDER — LISINOPRIL 10 MG PO TABS: 10.0000 mg | ORAL_TABLET | Freq: Every day | ORAL | Status: AC

## 2024-02-22 MED ORDER — THIAMINE HCL 100 MG PO TABS: 100.0000 mg | ORAL_TABLET | Freq: Every day | ORAL | Status: AC

## 2024-02-22 MED ORDER — INSULIN ASPART 100 UNIT/ML IJ SOLN: 0.0000 [IU] | INTRAMUSCULAR | Status: AC

## 2024-02-22 MED ORDER — INSULIN GLARGINE-YFGN 100 UNIT/ML ~~LOC~~ SOLN: 10.0000 [IU] | Freq: Every day | SUBCUTANEOUS | Status: AC

## 2024-02-22 MED ORDER — NICOTINE 21 MG/24HR TD PT24: 21.0000 mg | MEDICATED_PATCH | Freq: Every day | TRANSDERMAL | Status: AC

## 2024-02-22 MED ORDER — COLCHICINE 0.6 MG PO TABS: 0.6000 mg | ORAL_TABLET | Freq: Every day | ORAL | Status: AC

## 2024-02-22 NOTE — TOC Progression Note (Signed)
 Transition of Care [facility]) - Progression Note    Patient Details  Name: Edward Weber MRN: [id number] Date of Birth: [birth year]  [facility] CM/SW Contact  Lauraine JAYSON Carpen, LCSW Phone Number: 02/22/2024, 8:31 AM  Clinical Narrative:   Auth number obtained: [id number].   Expected Discharge Plan and Services                                               Social Drivers of Health (SDOH) Interventions SDOH Screenings   Food Insecurity: Patient Unable To Answer (02/05/2024)  Housing: Patient Unable To Answer (02/05/2024)  Transportation Needs: Patient Unable To Answer (02/05/2024)  Utilities: Patient Unable To Answer (02/05/2024)  Financial Resource Strain: Medium Risk (05/04/2023)   Received from [facility]  Social Connections: Patient Unable To Answer (02/05/2024)  Tobacco Use: High Risk (02/04/2024)    Readmission Risk Interventions     No data to display

## 2024-02-22 NOTE — Progress Notes (Signed)
 OT Cancellation Note  Patient Details Name: Edward Weber MRN: [id number] DOB: 1961-03-16   Cancelled Treatment:    Reason Eval/Treat Not Completed: Other (comment) (patient has been discharged, en route to SNF)  [name] Clause 02/22/2024, 2:42 PM

## 2024-02-22 NOTE — Discharge Summary (Signed)
 Physician Discharge Summary   Patient: Edward Weber MRN: [id number] DOB: 25-Nov-1960  Admit date:     02/04/2024  Discharge date: 02/22/24  Discharge Physician: Amaryllis Dare   PCP: [name], MD   Recommendations at discharge:  Please obtain CBC and BMP on follow-up Please encourage p.o. intake, patient need assistance with feeding. Follow-up with primary care provider  Discharge Diagnoses: Principal Problem:   Alcohol withdrawal with delirium (HCC) Active Problems:   Syncope   Chest pain   Alcohol abuse   Essential hypertension   GERD without esophagitis   History of CVA (cerebrovascular accident)   T2DM (type 2 diabetes mellitus) (HCC)   Chest pain with moderate risk for cardiac etiology  Resolved Problems:   Stroke, bilateral, acute, embolic  Hospital Course: Patient is a 63 years old male with past medical history of hypertension, stroke, history of sickle cell anemia, GERD polysubstance abuse presented to the hospital with complaints of dizziness and passing out spell while he was trying to walk to his bathroom.  He also had some shortness of breath and exertional chest pain and dyspnea on exertion for past few days.  Family was able to catch him and lowered him to the floor but patient complains of pain on the back.  There was report that recently his medications including gabapentin  and amlodipine were removed.  Patient states that he has been smoking cigarettes and he uses cocaine.  Denies any nausea vomiting or abdominal pain.  Denies any urinary urgency frequency or dysuria.  Denies any dizziness lightheadedness currently.  Denies any sick contacts or recent travel.   In the ED, patient had elevated blood pressure at 203/102.  Afebrile with a temperature of 98.1 F.  Labs were notable for normal WBC with hemoglobin of 11.5.  BMP with creatinine of 1.0.  BNP within normal range at 81.  COVID influenza and RSV was negative.  Chest x-ray showed no acute infiltrate.   CT angiogram of the chest showed no evidence of pulmonary embolism.  CT head scan was negative for acute findings.  Course of hospitalization was complicated by significant alcohol withdrawal, persistent encephalopathy there was concern of Wernicke's/Korsakoff.  Neurology was consulted and repeat brain imaging and EEG remained negative.  Patient was treated with high-dose thiamine  and should continue on 100 mg daily.  No more withdrawals and patient is now calmer.  He should continue with feeding supplement.  Patient still has poor p.o. intake which is slowly improving.  Patient need assistance with feeding.  Patient and family does not want any feeding tube at this time.  Patient did developed acute respiratory failure due to significant withdrawal and encephalopathy requiring intubation to protect airway, now stable on room air.  Patient developed right upper extremity DVT secondary to peripheral IV line, vascular surgery advised 28-month of anticoagulation, he received Lovenox  while in the hospital and is being discharged on Levaquin  to complete a total of 25-month of anticoagulation.  Patient was also found to have a subacute parietal region CVA, completed the stroke workup.  Patient also received treatment for strep pneumo pneumonia during current hospitalization.  Patient has an history of substance abuse and admits to taking cocaine along with alcohol.  UDS was also positive for opioids and THC.  Currently without any drugs and will need continuation of counseling.  Blood pressure currently stable on amlodipine.  He was also found to have aortic dilatation at 4 cm which will require outpatient surveillance.  Patient has an history  of gout and had a small flare in left ankle which is being treated with colchicine  and he will continue with allopurinol .  Due to the significant physical disability and persistent encephalopathy of her physical therapist recommending SNF for rehab where he is  being discharged.  Patient will continue on current medications and need to have a close follow-up with his providers for further assistance.      Consultants: Neurology.  PCCM.  Psychiatry Procedures performed: Intubation Disposition: Skilled nursing facility Diet recommendation:  Discharge Diet Orders (From admission, onward)     Start     Ordered   02/22/24 0000  Diet - low sodium heart healthy        02/22/24 1024           Dysphagia type 2 thin Liquid DISCHARGE MEDICATION: Allergies as of 02/22/2024       Reactions   Bee Venom Anaphylaxis   Bovine (beef) Protein Other (See Comments)   Personal preference   Bovine (beef) Protein-containing Drug Products    Personal preference   Lactalbumin    Milk-related Compounds Nausea And Vomiting   Intoloerance   Pegademase Bovine    Other reaction(s): Unknown Personal preference Personal preference Personal preference   Tilactase Nausea And Vomiting   Intoloerance   Milk (cow) Nausea And Vomiting   Intoloerance        Medication List     STOP taking these medications    AMBULATORY NON FORMULARY MEDICATION   aspirin  EC 81 MG tablet   HYDROcodone -acetaminophen  5-325 MG tablet Commonly known as: NORCO/VICODIN   lisinopril-hydrochlorothiazide  20-12.5 MG tablet Commonly known as: ZESTORETIC   predniSONE  50 MG tablet Commonly known as: DELTASONE        TAKE these medications    albuterol  108 (90 Base) MCG/ACT inhaler Commonly known as: VENTOLIN  HFA Inhale 2 puffs into the lungs daily as needed for wheezing or shortness of breath.   allopurinol  100 MG tablet Commonly known as: ZYLOPRIM  Take 1 tablet (100 mg total) by mouth daily.   amLODipine 10 MG tablet Commonly known as: NORVASC Take 1 tablet (10 mg total) by mouth daily. What changed:  medication strength how much to take   apixaban 5 MG Tabs tablet Commonly known as: ELIQUIS Take 1 tablet (5 mg total) by mouth 2 (two) times daily.    colchicine  0.6 MG tablet Take 1 tablet (0.6 mg total) by mouth daily for 14 days.   cyanocobalamin 1000 MCG/ML injection Commonly known as: VITAMIN B12 Inject 1 mL (1,000 mcg total) into the muscle once a week.   feeding supplement (KATE FARMS STANDARD ENT 1.4) Liqd liquid Take 325 mLs by mouth 3 (three) times daily between meals.   folic acid  1 MG tablet Commonly known as: FOLVITE  Take 1 tablet (1 mg total) by mouth daily.   gabapentin  100 MG capsule Commonly known as: NEURONTIN  Take 1 capsule (100 mg total) by mouth 2 (two) times daily. What changed: when to take this   insulin aspart 100 UNIT/ML injection Commonly known as: novoLOG Inject 0-15 Units into the skin every 4 (four) hours.   insulin glargine-yfgn 100 UNIT/ML injection Commonly known as: SEMGLEE Inject 0.1 mLs (10 Units total) into the skin daily. Start taking on: February 23, 2024   lactulose 10 GM/15ML solution Commonly known as: CHRONULAC Take 30 mLs (20 g total) by mouth 2 (two) times daily.   lisinopril 10 MG tablet Commonly known as: ZESTRIL Take 1 tablet (10 mg total) by mouth daily.  multivitamin with minerals Tabs tablet Take 1 tablet by mouth daily. Start taking on: February 23, 2024   nicotine 21 mg/24hr patch Commonly known as: NICODERM CQ - dosed in mg/24 hours Place 1 patch (21 mg total) onto the skin daily.   pantoprazole  40 MG tablet Commonly known as: PROTONIX  Take 40 mg by mouth daily.   thiamine  100 MG tablet Commonly known as: VITAMIN B1 Take 1 tablet (100 mg total) by mouth daily.   traZODone 50 MG tablet Commonly known as: DESYREL Take 50 mg by mouth at bedtime.   triamcinolone ointment 0.1 % Commonly known as: KENALOG Apply topically.        Contact information for follow-up providers     [name], MD. Schedule an appointment as soon as possible for a visit in 1 week(s).   Specialty: Internal Medicine Contact information: [address] [facility] [place] [address] [phone]              Contact information for after-discharge care     Destination     [facility] .   Service: Skilled Nursing Contact information: [address] [place]  [address] [phone]                    Discharge Exam: Fredricka Weights   02/20/24 0500 02/21/24 0501 02/22/24 0508  Weight: 82.9 kg 74.8 kg 81.3 kg   General.  Frail gentleman, in no acute distress. Pulmonary.  Lungs clear bilaterally, normal respiratory effort. CV.  Regular rate and rhythm, no JVD, rub or murmur. Abdomen.  Soft, nontender, nondistended, BS positive. CNS.  Alert and oriented to self.  No focal neurologic deficit. Extremities.  No edema, no cyanosis, pulses intact and symmetrical.    Condition at discharge: stable  The results of significant diagnostics from this hospitalization (including imaging, microbiology, ancillary and laboratory) are listed below for reference.   Imaging Studies: EEG adult Result Date: 02/21/2024 Shelton Arlin KIDD, MD     02/21/2024  5:10 PM Patient Name: Saw Mendenhall MRN: [id number] Epilepsy Attending: Arlin KIDD Shelton Referring Physician/Provider: Matthews Elida HERO, MD Date: 02/18/2024 Duration: 27.35 mins Patient history: 63 year old male with altered mental status.  EEG evaluate for seizure. Level of alertness: Awake AEDs during EEG study: GBP Technical aspects: This EEG study was done with scalp electrodes positioned according to the 10-20 International system of electrode placement. Electrical activity was reviewed with band pass filter of 1-70Hz , sensitivity of 7 uV/mm, display speed of 50mm/sec with a 60Hz  notched filter applied as appropriate. EEG data were recorded continuously and digitally stored.  Video monitoring was available and reviewed as appropriate. Description: The posterior dominant rhythm consists of 8Hz  activity of moderate voltage (25-35 uV) seen predominantly in  posterior head regions, symmetric and reactive to eye opening and eye closing.  EEG showed intermittent generalized 3 to 6 Hz theta-delta slowing.  Hyperventilation and photic stimulation were not performed.   Of note, study was technically difficult due to significant movement artifact. ABNORMALITY - Intermittent slow, generalized IMPRESSION: This technically difficult study is suggestive of generalized cerebral dysfunction (encephalopathy). No seizures or epileptiform discharges were seen throughout the recording. Priyanka KIDD Shelton   CT HEAD WO CONTRAST ( ) Result Date: 02/17/2024 EXAM: CT HEAD WITHOUT CONTRAST 02/17/2024 12:06:27 PM TECHNIQUE: CT of the head was performed without the administration of intravenous contrast. Automated exposure control, iterative reconstruction, and/or weight based adjustment of the mA/kV was utilized to reduce the radiation  dose to as low as reasonably achievable. COMPARISON: 02/04/2024 CLINICAL HISTORY: left sided weakness FINDINGS: BRAIN AND VENTRICLES: No acute hemorrhage. No evidence of acute infarct. Remote lacunar infarcts in bilateral deep gray nuclei. Patchy white matter hypodensities compatible with chronic microvascular ischemic disease. No hydrocephalus. No extra-axial collection. No mass effect or midline shift. ORBITS: Remote left lamina papyracea fracture. SINUSES: No acute abnormality. SOFT TISSUES AND SKULL: No acute soft tissue abnormality. Prior right frontal craniotomy. No skull fracture. IMPRESSION: 1. No acute intracranial abnormality. 2. Remote lacunar infarcts in the bilateral deep gray nuclei. 3. Chronic microvascular ischemic disease changes in the white matter. Electronically signed by: Lonni Necessary MD 02/17/2024 12:20 PM EST RP Workstation: [id number]   US  Carotid Bilateral Result Date: 02/14/2024 CLINICAL DATA:  Stroke EXAM: BILATERAL CAROTID DUPLEX ULTRASOUND TECHNIQUE: Elnor scale imaging, color Doppler and duplex ultrasound were performed  of bilateral carotid and vertebral arteries in the neck. COMPARISON:  None Available. FINDINGS: Criteria: Quantification of carotid stenosis is based on velocity parameters that correlate the residual internal carotid diameter with NASCET-based stenosis levels, using the diameter of the distal internal carotid lumen as the denominator for stenosis measurement. The following velocity measurements were obtained: RIGHT ICA: 101/36 cm/sec CCA: 56/11 cm/sec SYSTOLIC ICA/CCA RATIO:  1.8 ECA:  135 cm/sec LEFT ICA: 58/20 cm/sec CCA: 47/10 cm/sec SYSTOLIC ICA/CCA RATIO:  1.2 ECA:  73 cm/sec RIGHT CAROTID ARTERY: Trace smooth heterogeneous atherosclerotic plaque in the proximal internal carotid artery. By peak systolic velocity criteria, the estimated stenosis is less than 50%. RIGHT VERTEBRAL ARTERY:  Patent with normal antegrade flow. LEFT CAROTID ARTERY: Trace atherosclerotic plaque in the internal carotid bulb without evidence of stenosis. LEFT VERTEBRAL ARTERY:  Patent with normal antegrade flow. IMPRESSION: 1. Mild (1-49%) stenosis proximal right internal carotid artery secondary to heterogenous atherosclerotic plaque. 2. Trace atherosclerotic plaque in the left internal carotid bulb without evidence of stenosis. 3. Vertebral arteries are patent with normal antegrade flow. Electronically Signed   By: Wilkie Lent M.D.   On: 02/14/2024 16:16   MR CERVICAL SPINE W WO CONTRAST Result Date: 02/12/2024 EXAM: MRI BRAIN WITHOUT AND WITH CONTRAST MRI CERVICAL SPINE WITHOUT AND WITH CONTRAST 02/12/2024 03:36:28 PM TECHNIQUE: Multiplanar multisequence MRI of the brain was performed without and with the administration of intravenous contrast. Multiplanar multisequence MRI of the cervical spine was performed without and with the administration of intravenous contrast. 7.5 mL of gadobutrol (GADAVIST) 1 MMOL/ML injection was administered. COMPARISON: Cervical spine MRI dated 08/24/2023 and brain MRI dated 05/11/2022. CLINICAL  HISTORY: Mental status change, persistent or worsening. FINDINGS: MRI BRAIN: BRAIN AND VENTRICLES: Linear focus of hyperintensity on diffusion weighted imaging within the left centrum semiovale (series 9 image 22) without a corresponding ADC deficit, possibly late subacute ischemia. Chronic blood products of the right basal ganglia and external capsule. Multifocal hyperintense T2-weighted signal within the cerebral white matter, most commonly due to chronic small vessel disease. Mild volume loss. No acute infarct. No acute intracranial hemorrhage. No mass or abnormal enhancement. No midline shift. No hydrocephalus. The sella is unremarkable. Normal flow voids. ORBITS: No acute abnormality. SINUSES AND MASTOIDS: No acute abnormality. BONES AND SOFT TISSUES: Normal bone marrow signal. No acute soft tissue abnormality. LIMITATIONS/ARTIFACTS: Multiple sequences are degraded by motion. MRI CERVICAL SPINE: BONES AND ALIGNMENT: C4 corpectomy with interbody cage and anterior fusion at C3-C5, unchanged. Grade 1 anterolisthesis at C7-T1. Normal vertebral body heights. Marrow signal is unremarkable. No abnormal enhancement. SPINAL CORD: Normal spinal cord size. Normal spinal cord signal.  SOFT TISSUES: Unremarkable. C2-C3: Intermediate size disc osteophyte complex with mild spinal canal stenosis and moderate bilateral foraminal stenosis, unchanged. C3-C4: Postsurgical changes. No spinal canal or neural foraminal stenosis. C4-C5: Postsurgical changes, unchanged. Severe bilateral foraminal stenosis. No spinal canal stenosis. C5-C6: Small disc bulge with endplate spurring, unchanged. Mild spinal canal stenosis, unchanged. Severe bilateral foraminal stenosis. C6-C7: Small disc bulge with endplate spurring, unchanged. Severe bilateral foraminal stenosis. Mild central spinal canal stenosis. C7-T1: Grade 1 anterolisthesis. No significant disc herniation. No spinal canal stenosis or neural foraminal narrowing. IMPRESSION: 1. Linear  focus of diffusion-weighted hyperintensity in the left centrum semiovale without corresponding ADC reduction, possibly late subacute ischemia. 2. Severe bilateral foraminal stenosis at C4-5 without spinal canal stenosis, unchanged . 3. Severe bilateral foraminal stenosis at C5-6 with mild spinal canal stenosis, unchanged . 4. Severe bilateral foraminal stenosis at C6-7 with mild central spinal canal stenosis, unchanged . 5. C4 corpectomy with interbody cage and anterior fusion at C3-5. Electronically signed by: Franky Stanford MD 02/12/2024 04:19 PM EDT RP Workstation: [id number]   MR BRAIN W WO CONTRAST Result Date: 02/12/2024 EXAM: MRI BRAIN WITHOUT AND WITH CONTRAST MRI CERVICAL SPINE WITHOUT AND WITH CONTRAST 02/12/2024 03:36:28 PM TECHNIQUE: Multiplanar multisequence MRI of the brain was performed without and with the administration of intravenous contrast. Multiplanar multisequence MRI of the cervical spine was performed without and with the administration of intravenous contrast. 7.5 mL of gadobutrol (GADAVIST) 1 MMOL/ML injection was administered. COMPARISON: Cervical spine MRI dated 08/24/2023 and brain MRI dated 05/11/2022. CLINICAL HISTORY: Mental status change, persistent or worsening. FINDINGS: MRI BRAIN: BRAIN AND VENTRICLES: Linear focus of hyperintensity on diffusion weighted imaging within the left centrum semiovale (series 9 image 22) without a corresponding ADC deficit, possibly late subacute ischemia. Chronic blood products of the right basal ganglia and external capsule. Multifocal hyperintense T2-weighted signal within the cerebral white matter, most commonly due to chronic small vessel disease. Mild volume loss. No acute infarct. No acute intracranial hemorrhage. No mass or abnormal enhancement. No midline shift. No hydrocephalus. The sella is unremarkable. Normal flow voids. ORBITS: No acute abnormality. SINUSES AND MASTOIDS: No acute abnormality. BONES AND SOFT TISSUES: Normal bone marrow  signal. No acute soft tissue abnormality. LIMITATIONS/ARTIFACTS: Multiple sequences are degraded by motion. MRI CERVICAL SPINE: BONES AND ALIGNMENT: C4 corpectomy with interbody cage and anterior fusion at C3-C5, unchanged. Grade 1 anterolisthesis at C7-T1. Normal vertebral body heights. Marrow signal is unremarkable. No abnormal enhancement. SPINAL CORD: Normal spinal cord size. Normal spinal cord signal. SOFT TISSUES: Unremarkable. C2-C3: Intermediate size disc osteophyte complex with mild spinal canal stenosis and moderate bilateral foraminal stenosis, unchanged. C3-C4: Postsurgical changes. No spinal canal or neural foraminal stenosis. C4-C5: Postsurgical changes, unchanged. Severe bilateral foraminal stenosis. No spinal canal stenosis. C5-C6: Small disc bulge with endplate spurring, unchanged. Mild spinal canal stenosis, unchanged. Severe bilateral foraminal stenosis. C6-C7: Small disc bulge with endplate spurring, unchanged. Severe bilateral foraminal stenosis. Mild central spinal canal stenosis. C7-T1: Grade 1 anterolisthesis. No significant disc herniation. No spinal canal stenosis or neural foraminal narrowing. IMPRESSION: 1. Linear focus of diffusion-weighted hyperintensity in the left centrum semiovale without corresponding ADC reduction, possibly late subacute ischemia. 2. Severe bilateral foraminal stenosis at C4-5 without spinal canal stenosis, unchanged . 3. Severe bilateral foraminal stenosis at C5-6 with mild spinal canal stenosis, unchanged . 4. Severe bilateral foraminal stenosis at C6-7 with mild central spinal canal stenosis, unchanged . 5. C4 corpectomy with interbody cage and anterior fusion at C3-5. Electronically signed by: Franky Stanford  MD 02/12/2024 04:19 PM EDT RP Workstation: [id number]   EEG adult Result Date: 02/10/2024 Shelton Arlin KIDD, MD     02/10/2024  1:55 PM Patient Name: Yahia Bottger MRN: [id number] Epilepsy Attending: Arlin KIDD Shelton Referring Physician/Provider:  Terrace Space, PA-C Date: 02/10/2024 Duration: 30.30 mins Patient history: 63 year old male patient with a past medical history of hypertension, CVA, sickle cell anemia, GERD, polysubstance abuse including EtOH cocaine and marijuana presented to the ED for syncopal episode. EEG to evaluate for seizure Level of alertness: asleep, awake/ lethargic AEDs during EEG study: Valium Technical aspects: This EEG study was done with scalp electrodes positioned according to the 10-20 International system of electrode placement. Electrical activity was reviewed with band pass filter of 1-70Hz , sensitivity of 7 uV/mm, display speed of 82mm/sec with a 60Hz  notched filter applied as appropriate. EEG data were recorded continuously and digitally stored.  Video monitoring was available and reviewed as appropriate. Description: No clear posterior dominant rhythm was seen. Sleep was characterized by vertex waves, sleep spindles (12 to 14 Hz), maximal frontocentral region. There is continuous generalized 3 to 6 Hz theta-delta slowing. Hyperventilation and photic stimulation were not performed.   ABNORMALITY - Continuous slow, generalized IMPRESSION: This study is suggestive of generalized cerebral dysfunction (encephalopathy). No seizures or epileptiform discharges were seen throughout the recording.  Priyanka KIDD Shelton   US  Venous Img Upper Uni Right(DVT) Result Date: 02/10/2024 EXAM: US  Duplex right Upper Extremity Veins. TECHNIQUE: Real-time ultrasound scan of the veins of the right upper extremity with color Doppler flow, spectral waveform analysis and compression. COMPARISON: None. CLINICAL HISTORY: Swelling of extremity, right. FINDINGS: LIMITATIONS: The study is limited secondary to bandage material on the right neck and right forearm precluding visualization of the right internal jugular vein. There is also partial obscuration of the radial and ulnar veins. SUPERFICIAL VEINS: The cephalic vein is compressible and  demonstrates normal color Doppler flow. There is occlusive thrombus present within the basilic and radial veins. There is partial obscuration of the ulnar vein. DEEP VEINS: Visualization of the right internal jugular vein is precluded by bandage material. The subclavian, axillary, and brachial veins are compressible and demonstrate normal color Doppler flow. SOFT TISSUES: Bandage material is present on the right neck and right forearm. IMPRESSION: 1. Occlusive thrombus in the right basilic and radial veins. 2. Limited study due to bandage material on the right neck and right forearm, precluding visualization of the right internal jugular vein and partially obscuring the radial and ulnar veins. Electronically signed by: Evalene Coho MD 02/10/2024 11:47 AM EDT RP Workstation: [id number]   DG Abd 1 View Result Date: 02/10/2024 EXAM: 1 VIEW XRAY OF THE ABDOMEN 02/10/2024 01:38:22 AM COMPARISON: None available. CLINICAL HISTORY: Encounter for imaging study to confirm orogastric (OG) tube placement [id number]. NG tube placement. FINDINGS: LINES, TUBES AND DEVICES: Enteric tube courses below the hemidiaphragm with the tip overlying the expected region of the pyloric region/first portion of the duodenum. BOWEL: Nonobstructive bowel gas pattern. SOFT TISSUES: No opaque urinary calculi. BONES: No acute osseous abnormality. IMPRESSION: 1. Enteric tube tip overlies the region of the pylorus/first portion of the duodenum, appropriate for post-pyloric placement. Electronically signed by: Morgane Naveau MD 02/10/2024 01:42 AM EDT RP Workstation: [id number]   MR BRAIN WO CONTRAST Result Date: 02/10/2024 EXAM: MRI BRAIN WITHOUT CONTRAST 02/09/2024 01:59:31 PM TECHNIQUE: Multiplanar multisequence MRI of the head/brain was performed without the administration of intravenous contrast. COMPARISON: Comparison made with prior CT from 02/04/2024. CLINICAL HISTORY: Mental status change, unknown  cause. Pt on vent.63 y.o male with  significant PMH of HTN, CVA, sickle cell anemia, GERD, polysubstance abuse (EtOH abuse disorder, cocaine, marijuana tobacco abuse), and recurrent syncope who presented to the ED with chief complaints of syncopal episode. ; Per chart review, he has been having exertional chest pain, DOP for the past few days and today felt dizzy and passed out without hitting his head. FINDINGS: BRAIN AND VENTRICLES: Patchy T2/FLAIR hypertensity involving the periventricular and deep white matter of both cerebral hemispheres, consistent with chronic small vessel ischemic disease, moderate in nature. Patchy involvement of the pons noted. Multiple superimposed remote lacunar infarcts are present about the bilateral basal ganglia, thalami, and pons. Small remote right cerebellar infarct. A prominent remote infarct involving the right basal ganglia/external capsule is noted with associated chronic hemosiderin staining (series 9, image 36). There are a few subtle subcentimeter foci of mild diffusion signal abnormality involving the subcortical aspect of the posterior left frontal parietal region (series 5, images 38, 34). Findings likely reflect evolving subacute ischemic infarcts; these are non-acute/recent in appearance. No associated acute hemorrhage or mass effect with these evolving infarcts. Few additional punctate chronic microhemorrhages are noted within the left cerebral hemisphere, likely small vessel related. No acute intracranial hemorrhage. No mass. No midline shift. No hydrocephalus. The sella is unremarkable. Normal flow voids. ORBITS: No acute abnormality. SINUSES AND MASTOIDS: Mild scattered mucosal thickening is present about the ethmoid air cells. BONES AND SOFT TISSUES: Normal marrow signal. Postoperative changes are noted within the visualized upper cervical spine. Patient is intubated. No acute soft tissue abnormality. IMPRESSION: 1. Small subtle foci of diffusion signal abnormality involving the subcortical  posterior left frontal parietal region likely evolving small subacute ischemic infarcts. These are not acute/recent in appearance. 2. No other acute intracranial abnormality. 3. Underlying moderate chronic microvascular ischemic disease with multiple remote lacunar infarcts about the deep gray nuclei, pons, and cerebellum. Electronically signed by: Morene Hoard MD 02/10/2024 12:36 AM EDT RP Workstation: [id number]   US  Venous Img Lower Unilateral Right (DVT) Result Date: 02/09/2024 CLINICAL DATA:  Edema EXAM: RIGHT LOWER EXTREMITY VENOUS DOPPLER ULTRASOUND TECHNIQUE: Gray-scale sonography with compression, as well as color and duplex ultrasound, were performed to evaluate the deep venous system(s) from the level of the common femoral vein through the popliteal and proximal calf veins. COMPARISON:  None available FINDINGS: VENOUS Normal compressibility of the common femoral, superficial femoral, and popliteal veins, as well as the visualized calf veins. Visualized portions of profunda femoral vein and great saphenous vein unremarkable. No filling defects to suggest DVT on grayscale or color Doppler imaging. Doppler waveforms show normal direction of venous flow, normal respiratory plasticity and response to augmentation. Limited views of the contralateral common femoral vein are unremarkable. OTHER None. Limitations: none IMPRESSION: No right lower extremity DVT. Electronically Signed   By: Aliene Lloyd M.D.   On: 02/09/2024 12:26   DG Chest Port 1 View Result Date: 02/08/2024 EXAM: 1 VIEW(S) XRAY OF THE CHEST 02/08/2024 05:10:24 AM COMPARISON: None available. CLINICAL HISTORY: [id number] Acute respiratory failure with hypoxia (HCC) [id number]. Acute respiratory failure with hypoxia Acute respiratory failure with hypoxia FINDINGS: LINES, TUBES AND DEVICES: The endotracheal tube has been advanced to just above the orifice of the left main bronchus and needs to be pulled back to the mid trachea at least 4 cm.  The NGT terminates in the distal stomach. The right IJ line again has its tip in the upper right atrium. LUNGS AND PLEURA: No focal pulmonary opacity.  No pulmonary edema. No pleural effusion. No pneumothorax. HEART AND MEDIASTINUM: No acute abnormality of the cardiac and mediastinal silhouettes. There is calcification of the transverse aorta. BONES AND SOFT TISSUES: No acute osseous abnormality. IMPRESSION: 1. Endotracheal tube tip positioned just above the left main bronchus; recommend retracting approximately 4 cm to the mid trachea. 2. Right internal jugular central line tip projects into the upper right atrium. 3. Nasogastric tube terminates in the distal stomach. 4. No acute Radiographic cardiopulmonary findings. 5. . These results will be telephoned to the referring provider or the referring providers representative by professional radiology assistant [facility]) personnel, with communication documented in the [facility] dashboard. Electronically signed by: Francis Quam MD 02/08/2024 05:20 AM EDT RP Workstation: [id number]   DG Abd 1 View Result Date: 02/07/2024 EXAM: 1 VIEW XRAY OF THE ABDOMEN 02/07/2024 03:09:34 AM COMPARISON: None available. CLINICAL HISTORY: FINDINGS: LINES, TUBES AND DEVICES: Enteric tube terminates in the gastric antrum, in satisfactory position. BOWEL: Nonobstructive bowel gas pattern. SOFT TISSUES: 7 mm calculus projecting over the left kidney. BONES: No acute osseous abnormality. IMPRESSION: 1. Enteric tube in satisfactory position. Electronically signed by: Pinkie Pebbles MD 02/07/2024 03:13 AM EDT RP Workstation: [id number]   DG Chest 1 View Result Date: 02/07/2024 EXAM: 1 AP VIEW XRAY OF THE CHEST 02/07/2024 03:08:30 AM COMPARISON: 02/06/2024 CLINICAL HISTORY: ASPIRATION INTO AIRWAY FINDINGS: LINES, TUBES AND DEVICES: Right internal jugular central venous catheter in place with tip overlying the right atrium. Endotracheal tube in place with tip 1.7 cm above the carina. Enteric  tube in place coursing into the mid stomach. LUNGS AND PLEURA: No focal pulmonary opacity. No pulmonary edema. No pleural effusion. No pneumothorax. HEART AND MEDIASTINUM: No acute abnormality of the cardiac and mediastinal silhouettes. BONES AND SOFT TISSUES: No acute osseous abnormality. IMPRESSION: 1. Endotracheal tube with tip 1.7 cm above the carina. 2. Right internal jugular central venous catheter with tip overlying the right atrium. 3. Enteric tube courses into the stomach. Electronically signed by: Pinkie Pebbles MD 02/07/2024 03:12 AM EDT RP Workstation: [id number]   DG Chest Port 1 View Result Date: 02/06/2024 EXAM: 1 VIEW(S) XRAY OF THE CHEST 02/06/2024 07:30:00 PM COMPARISON: 02/06/2024 CLINICAL HISTORY: Central line placement FINDINGS: LINES, TUBES AND DEVICES: Right internal jugular central venous catheter in place with tip in the right atrium. Endotracheal tube in place with tip 2 cm above the carina. LUNGS AND PLEURA: No focal pulmonary opacity. No pulmonary edema. No pleural effusion. No pneumothorax. HEART AND MEDIASTINUM: No acute abnormality of the cardiac and mediastinal silhouettes. BONES AND SOFT TISSUES: No acute osseous abnormality. IMPRESSION: 1. Right internal jugular central venous catheter with tip in the right atrium, appropriate position. 2. Endotracheal tube with tip 2 cm above the carina, appropriate position. Electronically signed by: Pinkie Pebbles MD 02/06/2024 07:31 PM EDT RP Workstation: [id number]   DG Chest Port 1 View Result Date: 02/06/2024 CLINICAL DATA:  Dyspnea. EXAM: PORTABLE CHEST 1 VIEW COMPARISON:  Radiograph and CT 02/04/2024 FINDINGS: Lung volumes are low.The cardiomediastinal contours are stable allowing for differences in technique. Pulmonary vasculature is normal. No consolidation, pleural effusion, or pneumothorax. No acute osseous abnormalities are seen. IMPRESSION: Low lung volumes without acute abnormality. Electronically Signed   By: Andrea Gasman M.D.   On: 02/06/2024 18:28   ECHOCARDIOGRAM COMPLETE Result Date: 02/05/2024    ECHOCARDIOGRAM REPORT   Patient Name:   EISSA BUCHBERGER Date of Exam: 02/05/2024 Medical Rec #:  [id number]        Height:  68.0 in Accession #:    [id number]       Weight:       180.8 lb Date of Birth:  May 16, 1960        BSA:          1.958 m Patient Age:    63 years         BP:           158/117 mmHg Patient Gender: M                HR:           101 bpm. Exam Location:  [facility] Procedure: 2D Echo, Cardiac Doppler and Color Doppler (Both Spectral and Color            Flow Doppler were utilized during procedure). Indications:     Syncope R55  History:         Patient has prior history of Echocardiogram examinations.                  Stroke.  Sonographer:     Bari Roar Referring Phys:  [id number] POKHREL Diagnosing Phys: Evalene Lunger MD IMPRESSIONS  1. Left ventricular ejection fraction, by estimation, is 60 to 65%. The left ventricle has normal function. The left ventricle has no regional wall motion abnormalities. There is mild left ventricular hypertrophy. Left ventricular diastolic parameters are consistent with Grade I diastolic dysfunction (impaired relaxation).  2. Right ventricular systolic function is normal. The right ventricular size is normal. Tricuspid regurgitation signal is inadequate for assessing PA pressure.  3. The mitral valve is normal in structure. Mild mitral valve regurgitation. No evidence of mitral stenosis.  4. The aortic valve is normal in structure. Aortic valve regurgitation is mild. No aortic stenosis is present.  5. There is borderline dilatation of the ascending aorta, measuring 39 mm.  6. The inferior vena cava is normal in size with greater than 50% respiratory variability, suggesting right atrial pressure of 3 mmHg. FINDINGS  Left Ventricle: Left ventricular ejection fraction, by estimation, is 60 to 65%. The left ventricle has normal function. The left ventricle has no regional  wall motion abnormalities. Strain was performed and the global longitudinal strain is indeterminate. The left ventricular internal cavity size was normal in size. There is mild left ventricular hypertrophy. Left ventricular diastolic parameters are consistent with Grade I diastolic dysfunction (impaired relaxation). Right Ventricle: The right ventricular size is normal. No increase in right ventricular wall thickness. Right ventricular systolic function is normal. Tricuspid regurgitation signal is inadequate for assessing PA pressure. Left Atrium: Left atrial size was normal in size. Right Atrium: Right atrial size was normal in size. Pericardium: There is no evidence of pericardial effusion. Mitral Valve: The mitral valve is normal in structure. Mild mitral valve regurgitation. No evidence of mitral valve stenosis. MV peak gradient, 5.5 mmHg. The mean mitral valve gradient is 2.0 mmHg. Tricuspid Valve: The tricuspid valve is normal in structure. Tricuspid valve regurgitation is mild . No evidence of tricuspid stenosis. Aortic Valve: The aortic valve is normal in structure. Aortic valve regurgitation is mild. Aortic regurgitation PHT measures 534 msec. No aortic stenosis is present. Aortic valve mean gradient measures 3.0 mmHg. Aortic valve peak gradient measures 5.7 mmHg. Aortic valve area, by VTI measures 5.91 cm. Pulmonic Valve: The pulmonic valve was normal in structure. Pulmonic valve regurgitation is not visualized. No evidence of pulmonic stenosis. Aorta: The aortic root is normal in size and structure. There is borderline  dilatation of the ascending aorta, measuring 39 mm. Venous: The inferior vena cava is normal in size with greater than 50% respiratory variability, suggesting right atrial pressure of 3 mmHg. IAS/Shunts: No atrial level shunt detected by color flow Doppler. Additional Comments: 3D was performed not requiring image post processing on an independent workstation and was indeterminate.  LEFT  VENTRICLE PLAX 2D LVIDd:         4.50 cm   Diastology LVIDs:         3.20 cm   LV e' medial:    5.66 cm/s LV PW:         1.30 cm   LV E/e' medial:  9.7 LV IVS:        1.40 cm   LV e' lateral:   7.72 cm/s LVOT diam:     2.50 cm   LV E/e' lateral: 7.1 LV SV:         96 LV SV Index:   49 LVOT Area:     4.91 cm  RIGHT VENTRICLE RV Basal diam:  2.40 cm RV Mid diam:    2.20 cm RV S prime:     30.20 cm/s TAPSE (M-mode): 3.4 cm LEFT ATRIUM             Index        RIGHT ATRIUM           Index LA diam:        3.50 cm 1.79 cm/m   RA Area:     13.10 cm LA Vol (A2C):   56.7 ml 28.96 ml/m  RA Volume:   29.30 ml  14.97 ml/m LA Vol (A4C):   40.1 ml 20.48 ml/m LA Biplane Vol: 47.6 ml 24.31 ml/m  AORTIC VALVE                    PULMONIC VALVE AV Area (Vmax):    4.58 cm     PV Vmax:          1.01 m/s AV Area (Vmean):   4.59 cm     PV Peak grad:     4.1 mmHg AV Area (VTI):     5.91 cm     PR End Diast Vel: 5.66 msec AV Vmax:           119.00 cm/s  RVOT Peak grad:   2 mmHg AV Vmean:          76.000 cm/s AV VTI:            0.162 m AV Peak Grad:      5.7 mmHg AV Mean Grad:      3.0 mmHg LVOT Vmax:         111.00 cm/s LVOT Vmean:        71.000 cm/s LVOT VTI:          0.195 m LVOT/AV VTI ratio: 1.20 AI PHT:            534 msec  AORTA Ao Root diam: 3.30 cm Ao Asc diam:  3.90 cm MITRAL VALVE MV Area (PHT): 6.54 cm    SHUNTS MV Area VTI:   6.89 cm    Systemic VTI:  0.20 m MV Peak grad:  5.5 mmHg    Systemic Diam: 2.50 cm MV Mean grad:  2.0 mmHg MV Vmax:       1.17 m/s MV Vmean:      70.7 cm/s MV Decel Time: 116 msec MV E velocity: 55.00 cm/s MV  A velocity: 92.20 cm/s MV E/A ratio:  0.60 MV A Prime:    14.7 cm/s Evalene Lunger MD Electronically signed by Evalene Lunger MD Signature Date/Time: 02/05/2024/4:47:39 PM    Final    CT Angio Chest PE W and/or Wo Contrast Result Date: 02/04/2024 EXAM: CTA of the Chest with contrast for PE 02/04/2024 05:05:55 AM TECHNIQUE: CTA of the chest was performed without and with the  administration of 100 mL of iohexol  (OMNIPAQUE ) 350 MG/ML injection. Multiplanar reformatted images are provided for review. MIP images are provided for review. Automated exposure control, iterative reconstruction, and/or weight based adjustment of the mA/kV was utilized to reduce the radiation dose to as low as reasonably achievable. COMPARISON: 03/10/2028 CLINICAL HISTORY: Pulmonary embolism (PE) suspected, high prob. Per ed notes; Brought in by EMS due to getting dizzy and passing out while walking to his bedroom. Family caught the patient and lowered him to the floor. Patient did not hit his head or have any injuries from the fall. No blood thinners or complaints of pain. FINDINGS: PULMONARY ARTERIES: Pulmonary arteries are adequately opacified for evaluation. No pulmonary embolism. Main pulmonary artery is normal in caliber. MEDIASTINUM: The heart demonstrates mild cardiac enlargement. No pericardial effusion. Coronary artery calcifications. Aortic atherosclerosis. Aorta measures 4 cm (image 73/8). No acute findings within the imaged portions of the upper abdomen. Moderate-sized hiatal hernia. LYMPH NODES: No mediastinal, hilar or axillary lymphadenopathy. LUNGS AND PLEURA: The lungs are without acute process. No focal consolidation or pulmonary edema. No pleural effusion or pneumothorax. UPPER ABDOMEN: Morphologic features of the liver compatible with cirrhosis. Aortic atherosclerotic calcifications. Nonobstructing stone within the lower pole of the left kidney measures 7 mm. SOFT TISSUES AND BONES: Fixation hardware identified within the lower cervical spine. No acute soft tissue abnormality. IMPRESSION: 1. No evidence of pulmonary embolism. 2. Aorta measures 4 cm with atherosclerotic calcifications.Recommend annual imaging followup by CTA or MRA. This recommendation follows 2010 ACCF/AHA/AATS/ACR/ASA/SCA/SCAI/SIR/STS/SVM Guidelines for the Diagnosis and Management of Patients with Thoracic Aortic Disease.  Circulation. 2010; 121: [id number]. Aortic aneurysm NOS (ICD10-I71.9) 3. Aortic atherosclerosis and coronary artery calcifications. 4. Morphologic features of the liver compatible with cirrhosis. 5. Hiatal hernia. Electronically signed by: Waddell Calk MD 02/04/2024 06:20 AM EDT RP Workstation: [id number]   CT Head Wo Contrast Result Date: 02/04/2024 EXAM: CT HEAD WITHOUT CONTRAST 02/04/2024 05:05:55 AM TECHNIQUE: CT of the head was performed without the administration of intravenous contrast. Automated exposure control, iterative reconstruction, and/or weight based adjustment of the mA/kV was utilized to reduce the radiation dose to as low as reasonably achievable. COMPARISON: Brain MRI 05/11/2022. Head CT 09/18/2022. CLINICAL HISTORY: Mental status change, unknown cause. Brought in by EMS due to getting dizzy and passing out. No head injury or pain. FINDINGS: BRAIN AND VENTRICLES: No acute hemorrhage. No evidence of acute infarct. No hydrocephalus. No extra-axial collection. No mass effect or midline shift. Scattered chronic lacunar infarcts in the bilateral deep gray matter nuclei appear stable from last year. Additional patchy white matter hypodensity in both hemispheres appears stable. Chronic heterogeneity in the pons is stable. Advanced calcified atherosclerosis at the skull base. No suspicious intracranial vascular hyperdensity. Stable noncontrast CT appearance of advanced chronic small vessel disease. ORBITS: No acute abnormality. SINUSES: Paranasal sinuses, middle ears, and mastoids remain well aerated. SOFT TISSUES AND SKULL: No acute soft tissue abnormality. Chronic left lamina papyracea fracture. . Chronic right superior frontal craniotomy. Small benign left anterior frontal bone exostosis is stable and appears inconsequential. Congenital incomplete ossification of the posterior  C1 ring. Partially visible chronic C1-C2 degeneration. IMPRESSION: 1. No acute intracranial abnormality. 2. Stable  noncontrast CT appearance of advanced chronic small vessel disease. Electronically signed by: Helayne Hurst MD 02/04/2024 05:47 AM EDT RP Workstation: [id number]   DG Chest Portable 1 View Result Date: 02/04/2024 CLINICAL DATA:  Dizziness and recent syncopal episode EXAM: PORTABLE CHEST 1 VIEW COMPARISON:  01/05/2024 FINDINGS: The heart size and mediastinal contours are within normal limits. Both lungs are clear. The visualized skeletal structures are unremarkable. Postsurgical changes are noted in the cervical spine. IMPRESSION: No active disease. Electronically Signed   By: Oneil Devonshire M.D.   On: 02/04/2024 03:16    Microbiology: Results for orders placed or performed during the hospital encounter of 02/04/24  Resp panel by RT-PCR (RSV, Flu A&B, Covid) Anterior Nasal Swab     Status: None   Collection Time: 02/04/24  2:55 AM   Specimen: Anterior Nasal Swab  Result Value Ref Range Status   SARS Coronavirus 2 by RT PCR NEGATIVE NEGATIVE Final    Comment: (NOTE) SARS-CoV-2 target nucleic acids are NOT DETECTED.  The SARS-CoV-2 RNA is generally detectable in upper respiratory specimens during the acute phase of infection. The lowest concentration of SARS-CoV-2 viral copies this assay can detect is 138 copies/mL. A negative result does not preclude SARS-Cov-2 infection and should not be used as the sole basis for treatment or other patient management decisions. A negative result may occur with  improper specimen collection/handling, submission of specimen other than nasopharyngeal swab, presence of viral mutation(s) within the areas targeted by this assay, and inadequate number of viral copies(<138 copies/mL). A negative result must be combined with clinical observations, patient history, and epidemiological information. The expected result is Negative.  Fact Sheet for Patients:  bloggercourse.com  Fact Sheet for Healthcare Providers:   seriousbroker.it  This test is no t yet approved or cleared by the [place]  FDA and  has been authorized for detection and/or diagnosis of SARS-CoV-2 by FDA under an Emergency Use Authorization (EUA). This EUA will remain  in effect (meaning this test can be used) for the duration of the COVID-19 declaration under Section 564(b)(1) of the Act, 21 U.S.C.section 360bbb-3(b)(1), unless the authorization is terminated  or revoked sooner.       Influenza A by PCR NEGATIVE NEGATIVE Final   Influenza B by PCR NEGATIVE NEGATIVE Final    Comment: (NOTE) The Xpert Xpress SARS-CoV-2/FLU/RSV plus assay is intended as an aid in the diagnosis of influenza from Nasopharyngeal swab specimens and should not be used as a sole basis for treatment. Nasal washings and aspirates are unacceptable for Xpert Xpress SARS-CoV-2/FLU/RSV testing.  Fact Sheet for Patients: bloggercourse.com  Fact Sheet for Healthcare Providers: seriousbroker.it  This test is not yet approved or cleared by the [place]  FDA and has been authorized for detection and/or diagnosis of SARS-CoV-2 by FDA under an Emergency Use Authorization (EUA). This EUA will remain in effect (meaning this test can be used) for the duration of the COVID-19 declaration under Section 564(b)(1) of the Act, 21 U.S.C. section 360bbb-3(b)(1), unless the authorization is terminated or revoked.     Resp Syncytial Virus by PCR NEGATIVE NEGATIVE Final    Comment: (NOTE) Fact Sheet for Patients: bloggercourse.com  Fact Sheet for Healthcare Providers: seriousbroker.it  This test is not yet approved or cleared by the [place]  FDA and has been authorized for detection and/or diagnosis of SARS-CoV-2 by FDA under an Emergency Use Authorization (EUA). This EUA will remain  in effect (meaning this test can be used) for  the duration of the COVID-19 declaration under Section 564(b)(1) of the Act, 21 U.S.C. section 360bbb-3(b)(1), unless the authorization is terminated or revoked.  Performed at [facility], [address] [place], [place] [address]   Respiratory (~20 pathogens) panel by PCR     Status: None   Collection Time: 02/07/24  4:49 AM   Specimen: Nasopharyngeal Swab; Respiratory  Result Value Ref Range Status   Adenovirus NOT DETECTED NOT DETECTED Final   Coronavirus 229E NOT DETECTED NOT DETECTED Final    Comment: (NOTE) The Coronavirus on the Respiratory Panel, DOES NOT test for the novel  Coronavirus (2019 nCoV)    Coronavirus HKU1 NOT DETECTED NOT DETECTED Final   Coronavirus NL63 NOT DETECTED NOT DETECTED Final   Coronavirus OC43 NOT DETECTED NOT DETECTED Final   Metapneumovirus NOT DETECTED NOT DETECTED Final   Rhinovirus / Enterovirus NOT DETECTED NOT DETECTED Final   Influenza A NOT DETECTED NOT DETECTED Final   Influenza B NOT DETECTED NOT DETECTED Final   Parainfluenza Virus 1 NOT DETECTED NOT DETECTED Final   Parainfluenza Virus 2 NOT DETECTED NOT DETECTED Final   Parainfluenza Virus 3 NOT DETECTED NOT DETECTED Final   Parainfluenza Virus 4 NOT DETECTED NOT DETECTED Final   Respiratory Syncytial Virus NOT DETECTED NOT DETECTED Final   Bordetella pertussis NOT DETECTED NOT DETECTED Final   Bordetella Parapertussis NOT DETECTED NOT DETECTED Final   Chlamydophila pneumoniae NOT DETECTED NOT DETECTED Final   Mycoplasma pneumoniae NOT DETECTED NOT DETECTED Final    Comment: Performed at [facility], [address], [place], [place] [address]  MRSA Next Gen by PCR, Nasal     Status: None   Collection Time: 02/07/24  4:49 AM   Specimen: Nasal Mucosa; Nasal Swab  Result Value Ref Range Status   MRSA by PCR Next Gen NOT DETECTED NOT DETECTED Final    Comment: (NOTE) The GeneXpert MRSA Assay (FDA approved for NASAL specimens only), is one component of a  comprehensive MRSA colonization surveillance program. It is not intended to diagnose MRSA infection nor to guide or monitor treatment for MRSA infections. Test performance is not FDA approved in patients less than 62 years old. Performed at [facility], [address] [place], [place] [address]   Culture, blood (Routine X 2) w Reflex to ID Panel     Status: None   Collection Time: 02/07/24  6:11 AM   Specimen: BLOOD  Result Value Ref Range Status   Specimen Description BLOOD BLOOD RIGHT ARM  Final   Special Requests   Final    BOTTLES DRAWN AEROBIC AND ANAEROBIC Blood Culture results may not be optimal due to an inadequate volume of blood received in culture bottles   Culture   Final    NO GROWTH 5 DAYS Performed at [facility], [address] [place], [place] [address]    Report Status 02/12/2024 FINAL  Final  Culture, blood (Routine X 2) w Reflex to ID Panel     Status: None   Collection Time: 02/07/24  6:18 AM   Specimen: BLOOD  Result Value Ref Range Status   Specimen Description BLOOD BLOOD LEFT ARM  Final   Special Requests   Final    BOTTLES DRAWN AEROBIC AND ANAEROBIC Blood Culture adequate volume   Culture   Final    NO GROWTH 5 DAYS Performed at [facility], [address] [place], [place]  [address]    Report Status 02/12/2024 FINAL  Final  Culture, Respiratory w Gram Stain     Status: None   Collection Time: 02/07/24 10:48 AM   Specimen: Tracheal Aspirate; Respiratory  Result Value Ref Range Status   Specimen Description   Final    TRACHEAL ASPIRATE Performed at [facility], [address] [address] [place], [place] [address]    Special Requests   Final    NONE Performed at [facility], [address] [place], [place] [address]    Gram Stain   Final    FEW WBC PRESENT, PREDOMINANTLY PMN RARE GRAM POSITIVE COCCI Performed at [facility], [address] [place], [place] [address]    Culture RARE  STREPTOCOCCUS PNEUMONIAE  Final   Report Status 02/10/2024 FINAL  Final   Organism ID, Bacteria STREPTOCOCCUS PNEUMONIAE  Final      Susceptibility   Streptococcus pneumoniae - MIC*    ERYTHROMYCIN >=8 RESISTANT Resistant     LEVOFLOXACIN  0.5 SENSITIVE Sensitive     VANCOMYCIN 0.5 SENSITIVE Sensitive     PENICILLIN (meningitis) 1 RESISTANT Resistant     PENO - penicillin 1      PENICILLIN (non-meningitis) 1 SENSITIVE Sensitive     PENICILLIN (oral) 1 INTERMEDIATE Intermediate     CEFTRIAXONE (non-meningitis) 1 SENSITIVE Sensitive     CEFTRIAXONE (meningitis) 1 INTERMEDIATE Intermediate     * RARE STREPTOCOCCUS PNEUMONIAE  Culture, blood (Routine X 2) w Reflex to ID Panel     Status: None   Collection Time: 02/17/24  4:41 PM   Specimen: BLOOD LEFT ARM  Result Value Ref Range Status   Specimen Description BLOOD LEFT ARM  Final   Special Requests   Final    BOTTLES DRAWN AEROBIC ONLY Blood Culture adequate volume   Culture   Final    NO GROWTH 5 DAYS Performed at [facility], [address] [place], [place] [address]    Report Status 02/22/2024 FINAL  Final  Culture, blood (Routine X 2) w Reflex to ID Panel     Status: None   Collection Time: 02/17/24  4:42 PM   Specimen: BLOOD LEFT ARM  Result Value Ref Range Status   Specimen Description BLOOD LEFT ARM  Final   Special Requests   Final    BOTTLES DRAWN AEROBIC ONLY Blood Culture adequate volume   Culture   Final    NO GROWTH 5 DAYS Performed at [facility], [address] [place], [place] [address]    Report Status 02/22/2024 FINAL  Final    Labs: CBC: Recent Labs  Lab 02/16/24 0441 02/17/24 1257 02/20/24 0358 02/21/24 0327  WBC 14.8* 15.9* 18.2* 13.3*  NEUTROABS  --  10.8*  --   --   HGB 9.6* 9.4* 9.1* 9.2*  HCT 30.1* 29.6* 27.2* 28.8*  MCV 95.3 96.1 92.8 95.7  PLT 469* 515* 549* 572*   Basic Metabolic Panel: Recent Labs  Lab 02/16/24 0441 02/17/24 1257 02/18/24 1432 02/20/24 0358  02/21/24 0327  NA 143 138 135  --   --   K 4.6 4.3 4.2  --   --   CL 109 102 103  --   --   CO2 23 23 19*  --   --   GLUCOSE 147* 114* 116*  --   --   BUN 48* 48* 30*  --   --   CREATININE 1.35* 1.27* 1.16 0.98 0.82  CALCIUM  8.9 8.9 8.3*  --   --  MG 2.3  --   --   --   --    Liver Function Tests: Recent Labs  Lab 02/17/24 1257 02/18/24 1432  AST 50* 57*  ALT 55* 59*  ALKPHOS 93 101  BILITOT 0.6 0.8  PROT 7.4 7.4  ALBUMIN 2.7* 2.6*   CBG: Recent Labs  Lab 02/21/24 1154 02/21/24 1955 02/22/24 0015 02/22/24 0506 02/22/24 0823  GLUCAP 101* 129* 120* 109* 119*    Discharge time spent: greater than 30 minutes.  This record has been created using Conservation officer, historic buildings. Errors have been sought and corrected,but may not always be located. Such creation errors do not reflect on the standard of care.   Signed: Amaryllis Dare, MD [facility] 02/22/2024

## 2024-02-22 NOTE — Plan of Care (Signed)

## 2024-02-22 NOTE — TOC Transition Note (Signed)
 Transition of Care [facility]) - Discharge Note   Patient Details  Name: Edward Weber MRN: [id number] Date of Birth: [birth year]  [facility] CM/SW Contact:  Alfonso Rummer, LCSW Phone Number: 02/22/2024, 9:58 AM   Clinical Narrative:     Pt will transition to [facility] 02/22/24 [facility]. Pt daughter notified to hospital discharge. No further toc concerns at this time. TOC signing off.   Final next level of care: Skilled Nursing Facility Barriers to Discharge: Barriers Resolved   Patient Goals and CMS Choice            Discharge Placement PASRR number recieved: 02/22/24                Name of family member notified: Arnaldo Pacini Patient and family notified of of transfer: 02/22/24  Discharge Plan and Services Additional resources added to the After Visit Summary for                                       Social Drivers of Health (SDOH) Interventions SDOH Screenings   Food Insecurity: Patient Unable To Answer (02/05/2024)  Housing: Patient Unable To Answer (02/05/2024)  Transportation Needs: Patient Unable To Answer (02/05/2024)  Utilities: Patient Unable To Answer (02/05/2024)  Financial Resource Strain: Medium Risk (05/04/2023)   Received from [facility]  Social Connections: Patient Unable To Answer (02/05/2024)  Tobacco Use: High Risk (02/04/2024)     Readmission Risk Interventions     No data to display

## 2024-03-06 ENCOUNTER — Emergency Department

## 2024-03-06 ENCOUNTER — Other Ambulatory Visit: Payer: Self-pay

## 2024-03-06 ENCOUNTER — Encounter: Payer: Self-pay | Admitting: *Deleted

## 2024-03-06 ENCOUNTER — Emergency Department
Admission: EM | Admit: 2024-03-06 | Discharge: 2024-03-07 | Disposition: A | Discharge status: Skilled Nursing Facility | Attending: Emergency Medicine | Admitting: Emergency Medicine

## 2024-03-06 DIAGNOSIS — I129 Hypertensive chronic kidney disease with stage 1 through stage 4 chronic kidney disease, or unspecified chronic kidney disease: Secondary | ICD-10-CM | POA: Insufficient documentation

## 2024-03-06 DIAGNOSIS — W07XXXA Fall from chair, initial encounter: Secondary | ICD-10-CM | POA: Diagnosis not present

## 2024-03-06 DIAGNOSIS — S0990XA Unspecified injury of head, initial encounter: Secondary | ICD-10-CM | POA: Insufficient documentation

## 2024-03-06 DIAGNOSIS — E1122 Type 2 diabetes mellitus with diabetic chronic kidney disease: Secondary | ICD-10-CM | POA: Diagnosis not present

## 2024-03-06 DIAGNOSIS — S4992XA Unspecified injury of left shoulder and upper arm, initial encounter: Secondary | ICD-10-CM | POA: Diagnosis not present

## 2024-03-06 DIAGNOSIS — Z8546 Personal history of malignant neoplasm of prostate: Secondary | ICD-10-CM | POA: Diagnosis not present

## 2024-03-06 DIAGNOSIS — N183 Chronic kidney disease, stage 3 unspecified: Secondary | ICD-10-CM | POA: Diagnosis not present

## 2024-03-06 DIAGNOSIS — S3992XA Unspecified injury of lower back, initial encounter: Secondary | ICD-10-CM | POA: Diagnosis present

## 2024-03-06 DIAGNOSIS — S39012A Strain of muscle, fascia and tendon of lower back, initial encounter: Secondary | ICD-10-CM | POA: Diagnosis not present

## 2024-03-06 DIAGNOSIS — S50312A Abrasion of left elbow, initial encounter: Secondary | ICD-10-CM | POA: Insufficient documentation

## 2024-03-06 DIAGNOSIS — W19XXXA Unspecified fall, initial encounter: Secondary | ICD-10-CM

## 2024-03-06 DIAGNOSIS — S59902A Unspecified injury of left elbow, initial encounter: Secondary | ICD-10-CM

## 2024-03-06 DIAGNOSIS — E1142 Type 2 diabetes mellitus with diabetic polyneuropathy: Secondary | ICD-10-CM | POA: Diagnosis not present

## 2024-03-06 DIAGNOSIS — Z7901 Long term (current) use of anticoagulants: Secondary | ICD-10-CM | POA: Insufficient documentation

## 2024-03-06 MED ORDER — ACETAMINOPHEN ER 650 MG PO TBCR: 650.0000 mg | EXTENDED_RELEASE_TABLET | Freq: Three times a day (TID) | ORAL | 0 refills | Status: AC | PRN

## 2024-03-06 MED ORDER — ACETAMINOPHEN 325 MG PO TABS
650.0000 mg | ORAL_TABLET | Freq: Four times a day (QID) | ORAL | Status: AC | PRN
  Administered 2024-03-06: 650 mg via ORAL
  Filled 2024-03-06: qty 2

## 2024-03-06 MED ORDER — LIDOCAINE 5 % EX PTCH: 1.0000 | MEDICATED_PATCH | CUTANEOUS | 0 refills | Status: AC

## 2024-03-06 NOTE — ED Triage Notes (Signed)
 Pt brought in via ems from [facility].  Pt fell while trying to stand up from a chair.  Pt has skin tear to left elbow and has back pain.  No loc.  Pt alert  speech clear.
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
## 2024-03-06 NOTE — ED Triage Notes (Addendum)
 Pt arrives via EMS from [facility]; was attempting to stand unassisted and fell; c/o pain to head and back; left elbow skin tear wrapped by staff; FSBS 110; 97HR, 98% RA, 148/89

## 2024-03-06 NOTE — Discharge Instructions (Addendum)
 Have been diagnosed with fall, minor head injury, left shoulder soft tissue injury, left elbow soft tissue injury, lumbar strain.  Please take acetaminophen  every 8 hours as needed for pain.  You can apply lidocaine  patch in your back every 24 hours.  Please come back to ED or go to your PCP if you have new symptoms symptoms worsen.

## 2024-03-07 NOTE — ED Notes (Signed)
 [facility] here to transport pt to [facility]

## 2024-03-07 NOTE — ED Notes (Signed)
 Pt in hallway bed waiting on [facility].

## 2024-03-31 ENCOUNTER — Inpatient Hospital Stay
Admission: RE | Admit: 2024-03-31 | Discharge: 2024-03-31 | Disposition: A | Payer: Self-pay | Source: Ambulatory Visit | Attending: Neurosurgery | Admitting: Neurosurgery

## 2024-03-31 ENCOUNTER — Other Ambulatory Visit: Payer: Self-pay | Admitting: Family Medicine

## 2024-03-31 DIAGNOSIS — Z049 Encounter for examination and observation for unspecified reason: Secondary | ICD-10-CM

## 2024-03-31 NOTE — Progress Notes (Deleted)
 "  Referring Physician:  [name], MD [address] [facility] [place],  [place] [address]  Primary Physician:  Edward Norleen BIRCH, MD  History of Present Illness: 03/31/2024 [name] is here today with a chief complaint of ***  Neck pain that radiates down both arms. Any numbness, tingling or weakness?   Duration: *** Location: *** Quality: *** Severity: ***  Precipitating: aggravated by *** Modifying factors: made better by *** Weakness: none Timing: *** Bowel/Bladder Dysfunction: none  Conservative measures:  Physical therapy: *** has not participated in Multimodal medical therapy including regular antiinflammatories: *** Tylenol , Gabapentin , Hydrocodone , Prednisone , Oxycodone , Tramadol  Injections: no epidural steroid injections  Past Surgery: ***03/25/2018 ACDF C3-5 with C4 corpectomy   Edward Weber has ***no symptoms of cervical myelopathy.  The symptoms are causing a significant impact on the patient's life.   Review of Systems:  A 10 point review of systems is negative, except for the pertinent positives and negatives detailed in the HPI.  Past Medical History: Past Medical History:  Diagnosis Date   Alcoholism (HCC)    Allergic rhinitis    Allergic state    Anemia    Arthritis    Cancer (HCC)    Chronic hepatitis C (HCC)    Depression    Esophagitis, reflux    GERD (gastroesophageal reflux disease)    Gout    Helicobacter pylori gastritis    Hepatitis    Hypertension    Neuromuscular disorder (HCC)    Osteoarthrosis    Peripheral neuropathy    Rhabdomyolysis    Sickle cell anemia (HCC)    Stroke (HCC)    Stroke [facility])    Vitiligo     Past Surgical History: Past Surgical History:  Procedure Laterality Date   COLON SURGERY     COLONOSCOPY WITH PROPOFOL  N/A 05/09/2015   Procedure: COLONOSCOPY WITH PROPOFOL ;  Surgeon: Edward CINDERELLA Piedmont, MD;  Location: [facility] ENDOSCOPY;  Service: Gastroenterology;  Laterality: N/A;    ESOPHAGOGASTRODUODENOSCOPY N/A 05/09/2015   Procedure: ESOPHAGOGASTRODUODENOSCOPY (EGD);  Surgeon: Edward CINDERELLA Piedmont, MD;  Location: [facility] ENDOSCOPY;  Service: Gastroenterology;  Laterality: N/A;   ESOPHAGOGASTRODUODENOSCOPY (EGD) WITH PROPOFOL  N/A 05/04/2017   Procedure: ESOPHAGOGASTRODUODENOSCOPY (EGD) WITH PROPOFOL ;  Surgeon: Edward Weber, Edward POUR, MD;  Location: [facility] ENDOSCOPY;  Service: Gastroenterology;  Laterality: N/A;   FINGER DEBRIDEMENT  1976   s/p infected dog bite   TEE WITHOUT CARDIOVERSION N/A 05/20/2012   Procedure: TRANSESOPHAGEAL ECHOCARDIOGRAM (TEE);  Surgeon: Edward LULLA Gull, MD;  Location: [facility] ENDOSCOPY;  Service: Cardiovascular;  Laterality: N/A;    Allergies: Allergies as of 04/10/2024 - Review Complete 03/06/2024  Allergen Reaction Noted   Bee venom Anaphylaxis 12/04/2020   Bovine (beef) protein Other (See Comments) 05/01/2013   Bovine (beef) protein-containing drug products  05/01/2013   Lactalbumin  05/03/2017   Milk-related compounds Nausea And Vomiting 05/23/2012   Pegademase bovine  05/01/2013   Tilactase Nausea And Vomiting 01/25/2014   Milk (cow) Nausea And Vomiting 05/23/2012    Medications: Outpatient Encounter Medications as of 04/10/2024  Medication Sig   acetaminophen  (ACETAMINOPHEN  8 HOUR) 650 MG CR tablet Take 1 tablet (650 mg total) by mouth every 8 (eight) hours as needed for pain.   albuterol  (PROVENTIL  HFA;VENTOLIN  HFA) 108 (90 Base) MCG/ACT inhaler Inhale 2 puffs into the lungs daily as needed for wheezing or shortness of breath.   allopurinol  (ZYLOPRIM ) 100 MG tablet Take 1 tablet (100 mg total) by mouth daily.   amLODipine  (NORVASC ) 10 MG tablet  Take 1 tablet (10 mg total) by mouth daily.   apixaban  (ELIQUIS ) 5 MG TABS tablet Take 1 tablet (5 mg total) by mouth 2 (two) times daily.   colchicine  0.6 MG tablet Take 1 tablet (0.6 mg total) by mouth daily for 14 days.   cyanocobalamin  (VITAMIN B12) 1000 MCG/ML injection Inject 1 mL (1,000 mcg total) into the muscle  once a week.   folic acid  (FOLVITE ) 1 MG tablet Take 1 tablet (1 mg total) by mouth daily. (Patient not taking: Reported on 02/04/2024)   gabapentin  (NEURONTIN ) 100 MG capsule Take 1 capsule (100 mg total) by mouth 2 (two) times daily.   insulin  aspart (NOVOLOG ) 100 UNIT/ML injection Inject 0-15 Units into the skin every 4 (four) hours.   insulin  glargine-yfgn (SEMGLEE ) 100 UNIT/ML injection Inject 0.1 mLs (10 Units total) into the skin daily.   lactulose  (CHRONULAC ) 10 GM/15ML solution Take 30 mLs (20 g total) by mouth 2 (two) times daily.   lisinopril  (ZESTRIL ) 10 MG tablet Take 1 tablet (10 mg total) by mouth daily.   Multiple Vitamin (MULTIVITAMIN WITH MINERALS) TABS tablet Take 1 tablet by mouth daily.   nicotine  (NICODERM CQ  - DOSED IN MG/24 HOURS) 21 mg/24hr patch Place 1 patch (21 mg total) onto the skin daily.   Nutritional Supplements (FEEDING SUPPLEMENT, KATE FARMS STANDARD ENT 1.4,) LIQD liquid Take 325 mLs by mouth 3 (three) times daily between meals.   pantoprazole  (PROTONIX ) 40 MG tablet Take 40 mg by mouth daily.   thiamine  (VITAMIN B1) 100 MG tablet Take 1 tablet (100 mg total) by mouth daily.   traZODone  (DESYREL ) 50 MG tablet Take 50 mg by mouth at bedtime.   triamcinolone ointment (KENALOG) 0.1 % Apply topically.   No facility-administered encounter medications on file as of 04/10/2024.    Social History: Social History[1]  Family Medical History: No family history on file.  Physical Examination: @VITALWITHPAIN @  General: Patient is well developed, well nourished, calm, collected, and in no apparent distress. Attention to examination is appropriate.  Psychiatric: Patient is non-anxious.  Head:  Pupils equal, round, and reactive to light.  ENT:  Oral mucosa appears well hydrated.  Neck:   Supple.  ***Full range of motion.  Respiratory: Patient is breathing without any difficulty.  Extremities: No edema.  Vascular: Palpable dorsal pedal pulses.  Skin:   On  exposed skin, there are no abnormal skin lesions.  NEUROLOGICAL:     Awake, alert, oriented to person, place, and time.  Speech is clear and fluent. Fund of knowledge is appropriate.   Cranial Nerves: Pupils equal round and reactive to light.  Facial tone is symmetric.  Facial sensation is symmetric.  ROM of spine: ***full.  Palpation of spine: ***non tender.    Strength: Side Biceps Triceps Deltoid Interossei Grip Wrist Ext. Wrist Flex.  R 5 5 5 5 5 5 5   L 5 5 5 5 5 5 5    Side Iliopsoas Quads Hamstring PF DF EHL  R 5 5 5 5 5 5   L 5 5 5 5 5 5    Reflexes are ***2+ and symmetric at the biceps, triceps, brachioradialis, patella and achilles.   Hoffman's is absent.  Clonus is not present.  Toes are down-going.  Bilateral upper and lower extremity sensation is intact to light touch.    Gait is normal.   No difficulty with tandem gait.   No evidence of dysmetria noted.  Medical Decision Making  Imaging: ***  I have personally reviewed the  images and agree with the above interpretation.  Assessment and Plan: Mr. Methot is a pleasant 64 y.o. male with ***    Thank you for involving me in the care of this patient.   I spent a total of *** minutes in both face-to-face and non-face-to-face activities for this visit on the date of this encounter.   Edward Weber     [1]  Social History Tobacco Use   Smoking status: Former    Types: Cigarettes   Smokeless tobacco: Never  Vaping Use   Vaping status: Never Used  Substance Use Topics   Alcohol use: Not Currently    Alcohol/week: 9.0 standard drinks of alcohol    Types: 6 Cans of beer, 3 Shots of liquor per week    Comment: only on the weekends   Drug use: Yes    Types: Marijuana, Cocaine   "

## 2024-04-10 ENCOUNTER — Ambulatory Visit: Admitting: Neurosurgery

## 2024-05-03 NOTE — Progress Notes (Unsigned)
 "  Referring Physician:  [name], MD [address] [facility] [place],  [place] [address]  Primary Physician:  [name], MD  History of Present Illness: 05/03/2024 [name] is here today with a chief complaint of ***  Neck pain that radiates down both arms. Any numbness, tingling or weakness?   Duration: *** Location: *** Quality: *** Severity: ***  Precipitating: aggravated by *** Modifying factors: made better by *** Weakness: none Timing: *** Bowel/Bladder Dysfunction: none  Conservative measures:  Physical therapy: *** has not participated in Multimodal medical therapy including regular antiinflammatories: *** Tylenol , Gabapentin , Hydrocodone , Prednisone , Oxycodone , Tramadol  Injections: no epidural steroid injections  Past Surgery: ***03/25/2018 ACDF C3-5 with C4 corpectomy   Edward Weber has ***no symptoms of cervical myelopathy.  The symptoms are causing a significant impact on the patient's life.   Review of Systems:  A 10 point review of systems is negative, except for the pertinent positives and negatives detailed in the HPI.  Past Medical History: Past Medical History:  Diagnosis Date   Alcoholism (HCC)    Allergic rhinitis    Allergic state    Anemia    Arthritis    Cancer (HCC)    Chronic hepatitis C (HCC)    Depression    Esophagitis, reflux    GERD (gastroesophageal reflux disease)    Gout    Helicobacter pylori gastritis    Hepatitis    Hypertension    Neuromuscular disorder (HCC)    Osteoarthrosis    Peripheral neuropathy    Rhabdomyolysis    Sickle cell anemia (HCC)    Stroke (HCC)    Stroke (HCC)    Vitiligo     Past Surgical History: Past Surgical History:  Procedure Laterality Date   COLON SURGERY     COLONOSCOPY WITH PROPOFOL  N/A 05/09/2015   Procedure: COLONOSCOPY WITH PROPOFOL ;  Surgeon: Deward CINDERELLA Piedmont, MD;  Location: [facility] ENDOSCOPY;  Service: Gastroenterology;  Laterality: N/A;    ESOPHAGOGASTRODUODENOSCOPY N/A 05/09/2015   Procedure: ESOPHAGOGASTRODUODENOSCOPY (EGD);  Surgeon: Deward CINDERELLA Piedmont, MD;  Location: [facility] ENDOSCOPY;  Service: Gastroenterology;  Laterality: N/A;   ESOPHAGOGASTRODUODENOSCOPY (EGD) WITH PROPOFOL  N/A 05/04/2017   Procedure: ESOPHAGOGASTRODUODENOSCOPY (EGD) WITH PROPOFOL ;  Surgeon: Toledo, Ladell POUR, MD;  Location: [facility] ENDOSCOPY;  Service: Gastroenterology;  Laterality: N/A;   FINGER DEBRIDEMENT  1976   s/p infected dog bite   TEE WITHOUT CARDIOVERSION N/A 05/20/2012   Procedure: TRANSESOPHAGEAL ECHOCARDIOGRAM (TEE);  Surgeon: Vina LULLA Gull, MD;  Location: [facility] ENDOSCOPY;  Service: Cardiovascular;  Laterality: N/A;    Allergies: Allergies as of 05/09/2024 - Review Complete 03/06/2024  Allergen Reaction Noted   Bee venom Anaphylaxis 12/04/2020   Bovine (beef) protein Other (See Comments) 05/01/2013   Bovine (beef) protein-containing drug products  05/01/2013   Lactalbumin  05/03/2017   Milk-related compounds Nausea And Vomiting 05/23/2012   Pegademase bovine  05/01/2013   Tilactase Nausea And Vomiting 01/25/2014   Milk (cow) Nausea And Vomiting 05/23/2012    Medications: Outpatient Encounter Medications as of 05/09/2024  Medication Sig   acetaminophen  (ACETAMINOPHEN  8 HOUR) 650 MG CR tablet Take 1 tablet (650 mg total) by mouth every 8 (eight) hours as needed for pain.   albuterol  (PROVENTIL  HFA;VENTOLIN  HFA) 108 (90 Base) MCG/ACT inhaler Inhale 2 puffs into the lungs daily as needed for wheezing or shortness of breath.   allopurinol  (ZYLOPRIM ) 100 MG tablet Take 1 tablet (100 mg total) by mouth daily.   amLODipine  (NORVASC ) 10 MG tablet  Take 1 tablet (10 mg total) by mouth daily.   apixaban  (ELIQUIS ) 5 MG TABS tablet Take 1 tablet (5 mg total) by mouth 2 (two) times daily.   colchicine  0.6 MG tablet Take 1 tablet (0.6 mg total) by mouth daily for 14 days.   cyanocobalamin  (VITAMIN B12) 1000 MCG/ML injection Inject 1 mL (1,000 mcg total) into the muscle  once a week.   folic acid  (FOLVITE ) 1 MG tablet Take 1 tablet (1 mg total) by mouth daily. (Patient not taking: Reported on 02/04/2024)   gabapentin  (NEURONTIN ) 100 MG capsule Take 1 capsule (100 mg total) by mouth 2 (two) times daily.   insulin  aspart (NOVOLOG ) 100 UNIT/ML injection Inject 0-15 Units into the skin every 4 (four) hours.   insulin  glargine-yfgn (SEMGLEE ) 100 UNIT/ML injection Inject 0.1 mLs (10 Units total) into the skin daily.   lactulose  (CHRONULAC ) 10 GM/15ML solution Take 30 mLs (20 g total) by mouth 2 (two) times daily.   lisinopril  (ZESTRIL ) 10 MG tablet Take 1 tablet (10 mg total) by mouth daily.   Multiple Vitamin (MULTIVITAMIN WITH MINERALS) TABS tablet Take 1 tablet by mouth daily.   nicotine  (NICODERM CQ  - DOSED IN MG/24 HOURS) 21 mg/24hr patch Place 1 patch (21 mg total) onto the skin daily.   Nutritional Supplements (FEEDING SUPPLEMENT, KATE FARMS STANDARD ENT 1.4,) LIQD liquid Take 325 mLs by mouth 3 (three) times daily between meals.   pantoprazole  (PROTONIX ) 40 MG tablet Take 40 mg by mouth daily.   thiamine  (VITAMIN B1) 100 MG tablet Take 1 tablet (100 mg total) by mouth daily.   traZODone  (DESYREL ) 50 MG tablet Take 50 mg by mouth at bedtime.   triamcinolone ointment (KENALOG) 0.1 % Apply topically.   No facility-administered encounter medications on file as of 05/09/2024.    Social History: Social History[1]  Family Medical History: No family history on file.  Physical Examination: @VITALWITHPAIN @  General: Patient is well developed, well nourished, calm, collected, and in no apparent distress. Attention to examination is appropriate.  Psychiatric: Patient is non-anxious.  Head:  Pupils equal, round, and reactive to light.  ENT:  Oral mucosa appears well hydrated.  Neck:   Supple.  ***Full range of motion.  Respiratory: Patient is breathing without any difficulty.  Extremities: No edema.  Vascular: Palpable dorsal pedal pulses.  Skin:   On  exposed skin, there are no abnormal skin lesions.  NEUROLOGICAL:     Awake, alert, oriented to person, place, and time.  Speech is clear and fluent. Fund of knowledge is appropriate.   Cranial Nerves: Pupils equal round and reactive to light.  Facial tone is symmetric.  Facial sensation is symmetric.  ROM of spine: ***full.  Palpation of spine: ***non tender.    Strength: Side Biceps Triceps Deltoid Interossei Grip Wrist Ext. Wrist Flex.  R 5 5 5 5 5 5 5   L 5 5 5 5 5 5 5    Side Iliopsoas Quads Hamstring PF DF EHL  R 5 5 5 5 5 5   L 5 5 5 5 5 5    Reflexes are ***2+ and symmetric at the biceps, triceps, brachioradialis, patella and achilles.   Hoffman's is absent.  Clonus is not present.  Toes are down-going.  Bilateral upper and lower extremity sensation is intact to light touch.    Gait is normal.   No difficulty with tandem gait.   No evidence of dysmetria noted.  Medical Decision Making  Imaging: ***  I have personally reviewed the  images and agree with the above interpretation.  Assessment and Plan: Edward Weber is a pleasant 64 y.o. male with ***    Thank you for involving me in the care of this patient.   I spent a total of *** minutes in both face-to-face and non-face-to-face activities for this visit on the date of this encounter.   [employer] Dept. of Neurosurgery      [1]  Social History Tobacco Use   Smoking status: Former    Types: Cigarettes   Smokeless tobacco: Never  Vaping Use   Vaping status: Never Used  Substance Use Topics   Alcohol use: Not Currently    Alcohol/week: 9.0 standard drinks of alcohol    Types: 6 Cans of beer, 3 Shots of liquor per week    Comment: only on the weekends   Drug use: Yes    Types: Marijuana, Cocaine   "

## 2024-05-08 NOTE — Progress Notes (Unsigned)
 "  Referring Physician:  [name], MD [address] [facility] [place],  [place] [address]  Primary Physician:  [name], MD  History of Present Illness: 05/08/2024*** [name] has a history of HTN, GERD, hepatitis, DM, peripheral neuropathy, hemiplegia affecting nondominant side, history of CVA, prostate CA, CKD stage 3, alcohol abuse, gout, depression, sickle cell anemia.   Neck pain that radiates down both arms. Any numbness, tingling or weakness?      Tobacco use: Does not smoke.   Bowel/Bladder Dysfunction: none   Conservative measures:  Physical therapy: *** has not participated in Multimodal medical therapy including regular antiinflammatories: *** Tylenol , Gabapentin , Hydrocodone , Prednisone , Oxycodone , Tramadol  Injections: no epidural steroid injections   Past Surgery: ***03/25/2018 ACDF C3-5 with C4 corpectomy   Edward Weber has ***no symptoms of cervical myelopathy.  The symptoms are causing a significant impact on the patient's life.    Review of Systems:  A 10 point review of systems is negative, except for the pertinent positives and negatives detailed in the HPI.  Past Medical History: Past Medical History:  Diagnosis Date   Alcoholism (HCC)    Allergic rhinitis    Allergic state    Anemia    Arthritis    Cancer (HCC)    Chronic hepatitis C (HCC)    Depression    Esophagitis, reflux    GERD (gastroesophageal reflux disease)    Gout    Helicobacter pylori gastritis    Hepatitis    Hypertension    Neuromuscular disorder (HCC)    Osteoarthrosis    Peripheral neuropathy    Rhabdomyolysis    Sickle cell anemia (HCC)    Stroke (HCC)    Stroke (HCC)    Vitiligo     Past Surgical History: Past Surgical History:  Procedure Laterality Date   COLON SURGERY     COLONOSCOPY WITH PROPOFOL  N/A 05/09/2015   Procedure: COLONOSCOPY WITH PROPOFOL ;  Surgeon: Deward CINDERELLA Piedmont, MD;  Location: [facility] ENDOSCOPY;  Service:  Gastroenterology;  Laterality: N/A;   ESOPHAGOGASTRODUODENOSCOPY N/A 05/09/2015   Procedure: ESOPHAGOGASTRODUODENOSCOPY (EGD);  Surgeon: Deward CINDERELLA Piedmont, MD;  Location: [facility] ENDOSCOPY;  Service: Gastroenterology;  Laterality: N/A;   ESOPHAGOGASTRODUODENOSCOPY (EGD) WITH PROPOFOL  N/A 05/04/2017   Procedure: ESOPHAGOGASTRODUODENOSCOPY (EGD) WITH PROPOFOL ;  Surgeon: Toledo, Ladell POUR, MD;  Location: [facility] ENDOSCOPY;  Service: Gastroenterology;  Laterality: N/A;   FINGER DEBRIDEMENT  1976   s/p infected dog bite   TEE WITHOUT CARDIOVERSION N/A 05/20/2012   Procedure: TRANSESOPHAGEAL ECHOCARDIOGRAM (TEE);  Surgeon: Vina LULLA Gull, MD;  Location: [facility] ENDOSCOPY;  Service: Cardiovascular;  Laterality: N/A;    Allergies: Allergies as of 05/10/2024 - Review Complete 03/06/2024  Allergen Reaction Noted   Bee venom Anaphylaxis 12/04/2020   Bovine (beef) protein Other (See Comments) 05/01/2013   Bovine (beef) protein-containing drug products  05/01/2013   Lactalbumin  05/03/2017   Milk-related compounds Nausea And Vomiting 05/23/2012   Pegademase bovine  05/01/2013   Tilactase Nausea And Vomiting 01/25/2014   Milk (cow) Nausea And Vomiting 05/23/2012    Medications: Outpatient Encounter Medications as of 05/10/2024  Medication Sig   acetaminophen  (ACETAMINOPHEN  8 HOUR) 650 MG CR tablet Take 1 tablet (650 mg total) by mouth every 8 (eight) hours as needed for pain.   albuterol  (PROVENTIL  HFA;VENTOLIN  HFA) 108 (90 Base) MCG/ACT inhaler Inhale 2 puffs into the lungs daily as needed for wheezing or shortness of breath.   allopurinol  (ZYLOPRIM ) 100 MG tablet Take 1 tablet (100 mg  total) by mouth daily.   amLODipine  (NORVASC ) 10 MG tablet Take 1 tablet (10 mg total) by mouth daily.   apixaban  (ELIQUIS ) 5 MG TABS tablet Take 1 tablet (5 mg total) by mouth 2 (two) times daily.   colchicine  0.6 MG tablet Take 1 tablet (0.6 mg total) by mouth daily for 14 days.   cyanocobalamin  (VITAMIN B12) 1000 MCG/ML injection Inject  1 mL (1,000 mcg total) into the muscle once a week.   folic acid  (FOLVITE ) 1 MG tablet Take 1 tablet (1 mg total) by mouth daily. (Patient not taking: Reported on 02/04/2024)   gabapentin  (NEURONTIN ) 100 MG capsule Take 1 capsule (100 mg total) by mouth 2 (two) times daily.   insulin  aspart (NOVOLOG ) 100 UNIT/ML injection Inject 0-15 Units into the skin every 4 (four) hours.   insulin  glargine-yfgn (SEMGLEE ) 100 UNIT/ML injection Inject 0.1 mLs (10 Units total) into the skin daily.   lactulose  (CHRONULAC ) 10 GM/15ML solution Take 30 mLs (20 g total) by mouth 2 (two) times daily.   lisinopril  (ZESTRIL ) 10 MG tablet Take 1 tablet (10 mg total) by mouth daily.   Multiple Vitamin (MULTIVITAMIN WITH MINERALS) TABS tablet Take 1 tablet by mouth daily.   nicotine  (NICODERM CQ  - DOSED IN MG/24 HOURS) 21 mg/24hr patch Place 1 patch (21 mg total) onto the skin daily.   Nutritional Supplements (FEEDING SUPPLEMENT, KATE FARMS STANDARD ENT 1.4,) LIQD liquid Take 325 mLs by mouth 3 (three) times daily between meals.   pantoprazole  (PROTONIX ) 40 MG tablet Take 40 mg by mouth daily.   thiamine  (VITAMIN B1) 100 MG tablet Take 1 tablet (100 mg total) by mouth daily.   traZODone  (DESYREL ) 50 MG tablet Take 50 mg by mouth at bedtime.   triamcinolone ointment (KENALOG) 0.1 % Apply topically.   No facility-administered encounter medications on file as of 05/10/2024.    Social History: Social History[1]  Family Medical History: No family history on file.  Physical Examination: There were no vitals filed for this visit.  General: Patient is well developed, well nourished, calm, collected, and in no apparent distress. Attention to examination is appropriate.  Respiratory: Patient is breathing without any difficulty.   NEUROLOGICAL:     Awake, alert, oriented to person, place, and time.  Speech is clear and fluent. Fund of knowledge is appropriate.   Cranial Nerves: Pupils equal round and reactive to light.   Facial tone is symmetric.    *** ROM of cervical spine *** pain *** posterior cervical tenderness. *** tenderness in bilateral trapezial region.   *** ROM of lumbar spine *** pain *** posterior lumbar tenderness.   No abnormal lesions on exposed skin.   Strength: Side Biceps Triceps Deltoid Interossei Grip Wrist Ext. Wrist Flex.  R 5 5 5 5 5 5 5   L 5 5 5 5 5 5 5    Side Iliopsoas Quads Hamstring PF DF EHL  R 5 5 5 5 5 5   L 5 5 5 5 5 5    Reflexes are ***2+ and symmetric at the biceps, brachioradialis, patella and achilles.   Hoffman's is absent.  Clonus is not present.   Bilateral upper and lower extremity sensation is intact to light touch.     Gait is normal.   ***No difficulty with tandem gait.    Medical Decision Making  Imaging: Cervical CT scan dated 03/06/24:  FINDINGS:   CERVICAL SPINE:   BONES AND ALIGNMENT: No acute fracture or traumatic malalignment. Prior anterior fusion from C3 to  C5 with C4 corpectomy.   DEGENERATIVE CHANGES: Advanced diffuse degenerative disc disease and facet disease. Multilevel bilateral neural foraminal narrowing.   SOFT TISSUES: No prevertebral soft tissue swelling.   IMPRESSION: 1. No acute abnormality of the cervical spine. 2. Prior anterior fusion from C3 to C5 with C4 corpectomy. 3. Advanced diffuse degenerative disc disease and facet disease.   Electronically signed by: Franky Crease MD 03/06/2024 10:06 PM EST RP Workstation: [id number]  I have personally reviewed the images and agree with the above interpretation.  Assessment and Plan: Mr. Rock is a pleasant 64 y.o. male has ***  Treatment options discussed with patient and following plan made:   - Order for physical therapy for *** spine ***. Patient to call to schedule appointment. *** - Continue current medications including ***. Reviewed dosing and side effects.  - Prescription for ***. Reviewed dosing and side effects. Take with food.  - Prescription for ***  to take prn muscle spasms. Reviewed dosing and side effects. Discussed this can cause drowsiness.  - MRI of *** to further evaluate *** radiculopathy. No improvement time or medications (***).  - Referral to PMR at [facility] to discuss possible *** injections.  - Will schedule phone visit to review MRI results once I get them back.   I spent a total of *** minutes in face-to-face and non-face-to-face activities related to this patient's care today including review of outside records, review of imaging, review of symptoms, physical exam, discussion of differential diagnosis, discussion of treatment options, and documentation.   Thank you for involving me in the care of this patient.   Glade Boys PA-C Dept. of Neurosurgery    [1]  Social History Tobacco Use   Smoking status: Former    Types: Cigarettes   Smokeless tobacco: Never  Vaping Use   Vaping status: Never Used  Substance Use Topics   Alcohol use: Not Currently    Alcohol/week: 9.0 standard drinks of alcohol    Types: 6 Cans of beer, 3 Shots of liquor per week    Comment: only on the weekends   Drug use: Yes    Types: Marijuana, Cocaine   "

## 2024-05-09 ENCOUNTER — Ambulatory Visit: Admitting: Orthopedic Surgery

## 2024-05-10 ENCOUNTER — Ambulatory Visit: Admitting: Orthopedic Surgery

## 2024-05-10 NOTE — Progress Notes (Signed)
 Chief Complaint:   Chief Complaint  Patient presents with   Follow-up    Subjective:   Edward Weber is a 64 y.o. male in today to be of he waited about a concern that his home health nurse had about him losing weight.  He says he has been eating and drinking normally.  He does not feel like he has been losing weight.  Current Outpatient Medications  Medication Sig Dispense Refill   acetaminophen  (TYLENOL ) 500 MG tablet Take by mouth     albuterol  MDI, PROVENTIL , VENTOLIN , PROAIR , HFA 90 mcg/actuation inhaler INHALE 2 PUFFS BY MOUTH EVERY 6 HOURS AS NEEDED FOR WHEEZING 9 g 0   allopurinoL  (ZYLOPRIM ) 100 MG tablet Take 1 tablet (100 mg total) by mouth once daily 90 tablet 0   amLODIPine  (NORVASC ) 10 MG tablet Take 1 tablet (10 mg total) by mouth once daily 90 tablet 0   apixaban  (ELIQUIS ) 5 mg tablet Take 1 tablet (5 mg total) by mouth 2 (two) times daily 180 tablet 0   aspirin  81 MG EC tablet Take 81 mg by mouth once daily     blood glucose diagnostic test strip 1 each (1 strip total) 3 (three) times daily Use as instructed. 100 each 12   blood-glucose meter (ACCU-CHEK GUIDE ME GLUCOSE MTR) Misc 2 (two) times daily 1 each 0   clobetasol (TEMOVATE) 0.05 % cream Apply topically 2 (two) times daily. 30 g 5   colchicine  (COLCRYS ) 0.6 mg tablet Take 2 tablets (1.2 mg) at onset of gout flare. Take 1 additional tablet (0.6 mg) one hour later if symptoms persist. Maximum dose is 1.8 mg (3 tablets) in 24 hours. Then take one tablet (0.6 mg) daily until symptoms resolve. 12 tablet 2   cyanocobalamin  (VITAMIN B12) 1000 MCG tablet Take 1 tablet (1,000 mcg total) by mouth once daily 90 tablet 0   escitalopram  oxalate (LEXAPRO ) 20 MG tablet TAKE 1 TABLET BY MOUTH ONCE DAILY. APPOINTMENT NEEDED FOR FURTHER REFILLS. 90 tablet 0   fluticasone propionate (FLONASE) 50 mcg/actuation nasal spray Place 2 sprays into both nostrils 2 (two) times daily 16 g 3   folic acid  (FOLVITE ) 1 MG tablet  Take 1 tablet (1,000 mcg total) by mouth once daily 90 tablet 0   gabapentin  (NEURONTIN ) 100 MG capsule Take 1 capsule (100 mg total) by mouth 3 (three) times daily 270 capsule 0   hydrocortisone  1 % cream Apply topically as needed       insulin  glargine-yfgn (SEMGLEE ,INSULIN  GLARG-YFGN,PEN) injection (concentration 100 units/mL) Inject 0.1 mLs (10 Units total) subcutaneously at bedtime 3 mL 11   lactulose  (ENULOSE ) 10 gram/15 mL oral solution Take 30 mLs by mouth once daily 2700 mL 0   lancets Use 1 each 2 (two) times daily Use as instructed. 100 each 1   LANTUS  SOLOSTAR U-100 INSULIN  pen injector (concentration 100 units/mL) INJECT 10 UNITS TOTAL SUBCUTANEOUSLY AT BEDTIME     lisinopriL  (ZESTRIL ) 10 MG tablet Take 1 tablet (10 mg total) by mouth once daily 90 tablet 1   lisinopriL -hydroCHLOROthiazide  (ZESTORETIC ) 20-12.5 mg tablet Take 2 tablets by mouth once daily 180 tablet 0   nystatin (MYCOSTATIN) 100,000 unit/gram cream Apply topically 2 (two) times daily 15 g 0   pantoprazole  (PROTONIX ) 40 MG DR tablet Take 1 tablet (40 mg total) by mouth once daily 90 tablet 0   pen needle, diabetic 29 gauge x 1/2 needle Use as directed 100 each 12   pen needle, diabetic 31 gauge x 3/16  needle Use one needle daily to inject insulin . 100 each 3   potassium chloride  (KLOR-CON ) 10 MEQ ER tablet Take 1 tablet by mouth once daily 30 tablet 0   sildenafil  (REVATIO ) 20 mg tablet Take 3 to 5 tabs daily as needed 50 tablet 3   thiamine  (VITAMIN B-1) 100 MG tablet Take 1 tablet (100 mg total) by mouth once daily 90 tablet 0   traZODone  (DESYREL ) 50 MG tablet Take 1 tablet (50 mg total) by mouth at bedtime 30 tablet 11   triamcinolone 0.1 % ointment APPLY TOPICALLY TWICE DAILY 30 g 0   FUROsemide (LASIX) 20 MG tablet Take 1 tablet (20 mg total) by mouth once daily for 14 days 14 tablet 0   Current Facility-Administered Medications  Medication Dose Route Frequency Provider Last Rate Last Admin    cyanocobalamin  (VITAMIN B12) injection 1,000 mcg  1,000 mcg Intramuscular Q30 Days [name] Lenis, MD   1,000 mcg at 06/09/22 9193    Allergies as of 05/10/2024 - Reviewed 05/10/2024  Allergen Reaction Noted   Bee sting kit Anaphylaxis 12/04/2020   Venom-honey bee Anaphylaxis 12/04/2020   Beef containing products Vomiting 05/01/2013   Beef derived (bovine) Unknown 05/01/2013   Dairy aid  [lactase] Nausea And Vomiting 01/25/2014   Pegademase bovine Unknown 05/01/2013   Lactalbumin Nausea And Vomiting 05/23/2012   Milk Nausea And Vomiting 05/23/2012    Past Medical History:  Diagnosis Date   Alcoholism (CMS/HHS-HCC)    Allergic rhinitis due to other allergen    Allergic state    Anemia    Chronic hepatitis C (CMS/HHS-HCC)    Depression    Diverticulosis 05/09/2015   GERD (gastroesophageal reflux disease)    Helicobacter pylori gastritis 12/2011   Hypertension    Osteoarthrosis, unspecified whether generalized or localized, unspecified site    Peripheral neuropathy    Reflux esophagitis    Rhabdomyolysis    Stroke (CMS/HHS-HCC) 05/2011   Drags right leg   Vitamin B 12 deficiency    Vitiligo     Past Surgical History:  Procedure Laterality Date   Debridement of right middle finger  1976   s/p infected dog bite   COLONOSCOPY  05/09/2015   Diverticulosis/Otherwise normal colon/Repeat 42yrs/PYO   EGD  05/09/2015   Mild Gastritis/No Repeat/[facility]   EGD  05/04/2017   Repeat in 2 years/ [facility]    ARTHRODESIS ANTERIOR CERVICLE SPINE N/A 03/25/2018   Procedure: ARTHRODESIS ANTERIOR CERVICLE SPINE;  Surgeon: Rod Eleanor HERO, MD;  Location: [facility];  Service: Orthopedics;  Laterality: N/A;   INSTRUMENTATION ANTERIOR SPINE 2 TO 3 VERTEBRAL SEGMENTS N/A 03/25/2018   Procedure: INSTRUMENTATION ANTERIOR SPINE 2 TO 3 VERTEBRAL SEGMENTS;  Surgeon: Rod Eleanor HERO, MD;  Location: [facility];  Service: Orthopedics;  Laterality:  N/A;   INSERTION MORSELIZED BONE ALLOGRAFT FOR SPINE SURGERY N/A 03/25/2018   Procedure: INSERTION MORSELIZED BONE ALLOGRAFT FOR SPINE SURGERY;  Surgeon: Rod Eleanor HERO, MD;  Location: [facility];  Service: Orthopedics;  Laterality: N/A;   CORPECTOMY CERVICLE W/DECOMPRESSION  BY ANTERIOR APPROACH N/A 03/25/2018   Procedure: CORPECTOMY CERVICAL WITH DECOMPRESSION  BY ANTERIOR APPROACH;  Surgeon: Rod Eleanor HERO, MD;  Location: [facility];  Service: Orthopedics;  Laterality: N/A;     Family History  Problem Relation Name Age of Onset   Colon cancer Father  69   Coronary Artery Disease (Blocked arteries around heart) Mother     High blood pressure (Hypertension) Mother     Stroke  Mother     Diabetes type II Sister      Social History:  reports that he has been smoking cigarettes. He has never used smokeless tobacco. He reports that he does not currently use drugs. He reports that he does not drink alcohol.  Results for orders placed or performed in visit on 04/10/24  Basic Metabolic Panel (BMP)  Result Value Ref Range   Glucose 110 70 - 110 mg/dL   Sodium 863 863 - 854 mmol/L   Potassium 3.9 3.6 - 5.1 mmol/L   Chloride 102 97 - 109 mmol/L   Carbon Dioxide (CO2) 24.3 22.0 - 32.0 mmol/L   Calcium  9.7 8.7 - 10.3 mg/dL   Urea Nitrogen (BUN) 16 7 - 25 mg/dL   Creatinine 1.1 0.7 - 1.3 mg/dL   Glomerular Filtration Rate (eGFR) 74 >60 mL/min/1.73sq m   BUN/Crea Ratio 14.5 6.0 - 20.0   Anion Gap w/K 13.6 6.0 - 16.0  CBC w/auto Differential (5 Part)  Result Value Ref Range   WBC (White Blood Cell Count) 8.5 4.1 - 10.2 103/uL   RBC (Red Blood Cell Count) 3.68 (L) 4.69 - 6.13 106/uL   Hemoglobin 11.0 (L) 14.1 - 18.1 gm/dL   Hematocrit 65.5 (L) 59.9 - 52.0 %   MCV (Mean Corpuscular Volume) 93.5 80.0 - 100.0 fl   MCH (Mean Corpuscular Hemoglobin) 29.9 27.0 - 31.2 pg   MCHC (Mean Corpuscular Hemoglobin Concentration) 32.0 32.0 - 36.0 gm/dL   Platelet Count  501 (H) 150 - 450 103/uL   RDW-CV (Red Cell Distribution Width) 14.8 11.6 - 14.8 %   MPV (Mean Platelet Volume) 8.7 (L) 9.4 - 12.4 fl   Neutrophils 5.64 1.50 - 7.80 103/uL   Lymphocytes 2.10 1.00 - 3.60 103/uL   Monocytes 0.42 0.00 - 1.50 103/uL   Eosinophils 0.18 0.00 - 0.55 103/uL   Basophils 0.05 0.00 - 0.09 103/uL   Neutrophil % 66.7 32.0 - 70.0 %   Lymphocyte % 24.8 10.0 - 50.0 %   Monocyte % 5.0 4.0 - 13.0 %   Eosinophil % 2.1 1.0 - 5.0 %   Basophil% 0.6 0.0 - 2.0 %   Immature Granulocyte % 0.8 (H) <=0.7 %   Immature Granulocyte Count 0.07 (H) <=0.06 10^3/L      ROS:  General: No fever, chills or recent illness. No change in weight Skin:   No skin lesions, growths, masses, rashes, pruritus  HEENT: No change in vision or hearing. No pain or difficulty with swallowing Respiratory: No cough or shortness of breath CV:  No chest pain or palpitations GI:  No pain, dyspepsia or change in bowel habits GU:  No dysuria, frequency, or hesitancy MSK:  Positive for joint pain Objective:   Body mass index is 28.51 kg/m.  BP 130/88   Pulse 89   Wt 82.6 kg (182 lb)   SpO2 96%   BMI 28.51 kg/m   General: WD/WN male, in no acute distress HEENT: Pupils equal and round, EOMI. oral mucosa moist.  Oropharynx clear. Neck: supple, trachea midline; no thyromegaly Respiratory:clear to auscultation.  No dullness to percussion.  No use of accessory muscles. Cardiac:  Regular rate and rhythm without murmur, gallops, or rubs   Assessment/Plan:   Essential hypertension  (primary encounter diagnosis) Type 2 diabetes mellitus without complication, with long-term current use of insulin  (CMS/HHS-HCC)  Assessment and Plan  1.  Weight loss.  Reviewed his past 3 visits weights.  Weight appears to be stable within a couple  pounds.  Asked patient to start recording his weights and if he has any concern to let us  know.  Do not see need for any further workup at this time. 2.  Hypertension.   Well-controlled. 3.  Diabetes.  Blood sugars have been running well under control.  He is rarely using any insulin  at this time.    Goals      * Maintain health/healthy lifestyle (pt-stated)     * Maintain health/healthy lifestyle (pt-stated)       Goals       Patient Stated    * Maintain health/healthy lifestyle (pt-stated)     * Patient Goals (pt-stated)      Improve health             * Patient Goals (pt-stated)      Improve health         NORLEEN ALM ROWER, MD  Portions of this note were created using dictation software and may contain typographical errors.  *Some images could not be shown.

## 2024-06-14 ENCOUNTER — Ambulatory Visit: Payer: Medicare (Managed Care) | Admitting: Neurosurgery

## 2024-07-10 ENCOUNTER — Other Ambulatory Visit

## 2024-07-18 ENCOUNTER — Ambulatory Visit: Admitting: Urology

## 2024-07-21 ENCOUNTER — Ambulatory Visit: Admitting: Urology

## 2024-07-27 ENCOUNTER — Ambulatory Visit: Admitting: Urology
